# Patient Record
Sex: Female | Born: 1940 | Race: White | Hispanic: No | Smoking: Never smoker
Health system: Southern US, Community
[De-identification: ages and names within clinical notes are randomized; demographics above are authoritative.]

## PROBLEM LIST (undated history)

## (undated) DIAGNOSIS — Z800 Family history of malignant neoplasm of digestive organs: Secondary | ICD-10-CM

## (undated) DIAGNOSIS — Z1501 Genetic susceptibility to malignant neoplasm of breast: Secondary | ICD-10-CM

## (undated) DIAGNOSIS — Z1589 Genetic susceptibility to other disease: Principal | ICD-10-CM

## (undated) DIAGNOSIS — I10 Essential (primary) hypertension: Secondary | ICD-10-CM

## (undated) DIAGNOSIS — Z1509 Genetic susceptibility to other malignant neoplasm: Secondary | ICD-10-CM

## (undated) DIAGNOSIS — Z4689 Encounter for fitting and adjustment of other specified devices: Secondary | ICD-10-CM

## (undated) DIAGNOSIS — Z923 Personal history of irradiation: Secondary | ICD-10-CM

## (undated) DIAGNOSIS — C44301 Unspecified malignant neoplasm of skin of nose: Secondary | ICD-10-CM

## (undated) DIAGNOSIS — M1990 Unspecified osteoarthritis, unspecified site: Secondary | ICD-10-CM

## (undated) DIAGNOSIS — Z87442 Personal history of urinary calculi: Secondary | ICD-10-CM

## (undated) DIAGNOSIS — E785 Hyperlipidemia, unspecified: Secondary | ICD-10-CM

## (undated) DIAGNOSIS — J189 Pneumonia, unspecified organism: Secondary | ICD-10-CM

## (undated) DIAGNOSIS — C50919 Malignant neoplasm of unspecified site of unspecified female breast: Secondary | ICD-10-CM

## (undated) DIAGNOSIS — Z803 Family history of malignant neoplasm of breast: Secondary | ICD-10-CM

## (undated) HISTORY — PX: APPENDECTOMY: SHX54

## (undated) HISTORY — DX: Genetic susceptibility to other malignant neoplasm: Z15.09

## (undated) HISTORY — DX: Family history of malignant neoplasm of breast: Z80.3

## (undated) HISTORY — DX: Hyperlipidemia, unspecified: E78.5

## (undated) HISTORY — PX: LITHOTRIPSY: SUR834

## (undated) HISTORY — DX: Essential (primary) hypertension: I10

## (undated) HISTORY — DX: Genetic susceptibility to other disease: Z15.89

## (undated) HISTORY — DX: Family history of malignant neoplasm of digestive organs: Z80.0

## (undated) HISTORY — DX: Genetic susceptibility to malignant neoplasm of breast: Z15.01

## (undated) HISTORY — PX: OTHER SURGICAL HISTORY: SHX169

## (undated) HISTORY — PX: OVARIAN CYST REMOVAL: SHX89

---

## 1999-11-30 ENCOUNTER — Other Ambulatory Visit: Admission: RE | Admit: 1999-11-30 | Discharge: 1999-11-30 | Payer: Self-pay | Admitting: Internal Medicine

## 2003-12-18 ENCOUNTER — Encounter: Admission: RE | Admit: 2003-12-18 | Discharge: 2003-12-18 | Payer: Self-pay | Admitting: Internal Medicine

## 2004-09-27 ENCOUNTER — Other Ambulatory Visit: Admission: RE | Admit: 2004-09-27 | Discharge: 2004-09-27 | Payer: Self-pay | Admitting: Obstetrics and Gynecology

## 2005-04-17 HISTORY — PX: POLYPECTOMY: SHX149

## 2005-05-02 ENCOUNTER — Ambulatory Visit: Payer: Self-pay | Admitting: Internal Medicine

## 2005-05-11 ENCOUNTER — Ambulatory Visit: Payer: Self-pay | Admitting: Internal Medicine

## 2005-05-11 ENCOUNTER — Encounter (INDEPENDENT_AMBULATORY_CARE_PROVIDER_SITE_OTHER): Payer: Self-pay | Admitting: *Deleted

## 2005-09-28 ENCOUNTER — Other Ambulatory Visit: Admission: RE | Admit: 2005-09-28 | Discharge: 2005-09-28 | Payer: Self-pay | Admitting: Obstetrics & Gynecology

## 2006-07-11 ENCOUNTER — Emergency Department (HOSPITAL_COMMUNITY): Admission: EM | Admit: 2006-07-11 | Discharge: 2006-07-11 | Payer: Self-pay | Admitting: Emergency Medicine

## 2006-07-14 ENCOUNTER — Ambulatory Visit (HOSPITAL_BASED_OUTPATIENT_CLINIC_OR_DEPARTMENT_OTHER): Admission: RE | Admit: 2006-07-14 | Discharge: 2006-07-14 | Payer: Self-pay | Admitting: Urology

## 2006-07-17 ENCOUNTER — Ambulatory Visit (HOSPITAL_COMMUNITY): Admission: RE | Admit: 2006-07-17 | Discharge: 2006-07-17 | Payer: Self-pay | Admitting: Urology

## 2006-12-19 ENCOUNTER — Other Ambulatory Visit: Admission: RE | Admit: 2006-12-19 | Discharge: 2006-12-19 | Payer: Self-pay | Admitting: Obstetrics and Gynecology

## 2008-01-22 ENCOUNTER — Other Ambulatory Visit: Admission: RE | Admit: 2008-01-22 | Discharge: 2008-01-22 | Payer: Self-pay | Admitting: Obstetrics and Gynecology

## 2008-07-31 DIAGNOSIS — M810 Age-related osteoporosis without current pathological fracture: Secondary | ICD-10-CM | POA: Insufficient documentation

## 2008-07-31 DIAGNOSIS — I10 Essential (primary) hypertension: Secondary | ICD-10-CM | POA: Insufficient documentation

## 2009-02-23 ENCOUNTER — Ambulatory Visit (HOSPITAL_COMMUNITY): Admission: RE | Admit: 2009-02-23 | Discharge: 2009-02-23 | Payer: Self-pay | Admitting: Internal Medicine

## 2010-06-01 NOTE — Op Note (Signed)
NAME:  Shawna [name], Shawna [name]             ACCOUNT NO.:  [id number]   MEDICAL RECORD NO.:  [id number]          PATIENT TYPE:  AMB   LOCATION:  [facility]                         FACILITY:  [facility]   PHYSICIAN:  Valetta Fuller, M.D.  DATE OF BIRTH:  [birth year]   DATE OF PROCEDURE:  07/14/2006  DATE OF DISCHARGE:  07/14/2006                               OPERATIVE REPORT   PREOPERATIVE DIAGNOSIS:  Left proximal ureteral calculus.   POSTOPERATIVE DIAGNOSIS:  Left proximal ureteral calculus.   PROCEDURE:  Cystoscopy, left retrograde pyelography, and left double J  stent placement.   SURGEON:  Valetta Fuller, M.D.   ANESTHESIA:  General.   INDICATIONS FOR PROCEDURE:  The patient is a 70 year old female.  She  presented to see me yesterday with left flank pain.  She had presented  to the emergency room with typical renal colic and gross hematuria.  CT  showed a 6 x 8 mm calcification in her proximal left ureter at the L3  level.  The patient continued to have constant pain and when we saw her  she requested intervention.  We felt that the stone had a low likelihood  of spontaneous passage.  We felt that given potential delay for several  days for lithotripsy that she would be better off having temporary  placement of a double J stent followed by lithotripsy which has been  scheduled for early next week.  She appears to understand the advantages  and disadvantages of this approach.   DESCRIPTION OF PROCEDURE:  The patient was brought to the operating room  where she had successful induction of general anesthesia.  She was  placed in the lithotomy position and prepped and draped in the usual  fashion.  Cystoscopy was unremarkable.  Attention was turned to left  retrograde pyelography.   A left retrograde was done with fluoroscopic interpretation.  The distal  ureter was normal.  In the proximal ureter a 6-8 mm filling defect was  encountered with moderate to high grade obstruction.  This was  consistent with the stone previously seen on CT scan.   After retrograde pyelogram, a guide wire was placed up to the renal  pelvis with what appeared to be migration of the stone back into the  renal pelvis.  Over the guide wire, we placed a 7 French 24 cm stent  without difficulty and good positioning was confirmed.  The patient  appeared to tolerate the procedure well.  There were no obvious  complications or difficulty.  She was brought to the recovery room in  stable condition.           ______________________________  Valetta Fuller, M.D.  Electronically Signed     DSG/MEDQ  D:  07/19/2006  T:  07/20/2006  Job:  [id number]

## 2010-06-15 ENCOUNTER — Ambulatory Visit (AMBULATORY_SURGERY_CENTER): Payer: 59 | Admitting: *Deleted

## 2010-06-15 VITALS — Ht 68.5 in | Wt 138.0 lb

## 2010-06-15 DIAGNOSIS — Z86010 Personal history of colonic polyps: Secondary | ICD-10-CM

## 2010-06-15 MED ORDER — PEG-KCL-NACL-NASULF-NA ASC-C 100 G PO SOLR: ORAL | Status: DC

## 2010-06-17 ENCOUNTER — Ambulatory Visit (HOSPITAL_COMMUNITY): Payer: 59 | Attending: Internal Medicine

## 2010-06-17 DIAGNOSIS — M810 Age-related osteoporosis without current pathological fracture: Secondary | ICD-10-CM | POA: Insufficient documentation

## 2010-06-18 HISTORY — PX: COLONOSCOPY: SHX174

## 2010-06-29 ENCOUNTER — Encounter: Payer: Self-pay | Admitting: Internal Medicine

## 2010-06-29 ENCOUNTER — Ambulatory Visit (AMBULATORY_SURGERY_CENTER): Payer: 59 | Admitting: Internal Medicine

## 2010-06-29 VITALS — BP 145/91 | HR 69 | Temp 97.9°F | Resp 21 | Ht 68.5 in | Wt 138.0 lb

## 2010-06-29 DIAGNOSIS — K5730 Diverticulosis of large intestine without perforation or abscess without bleeding: Secondary | ICD-10-CM

## 2010-06-29 DIAGNOSIS — Z1211 Encounter for screening for malignant neoplasm of colon: Secondary | ICD-10-CM

## 2010-06-29 DIAGNOSIS — Z800 Family history of malignant neoplasm of digestive organs: Secondary | ICD-10-CM

## 2010-06-29 DIAGNOSIS — Z86010 Personal history of colonic polyps: Secondary | ICD-10-CM

## 2010-06-29 MED ORDER — SODIUM CHLORIDE 0.9 % IV SOLN: 500.0000 mL | INTRAVENOUS | Status: DC

## 2010-06-29 NOTE — Patient Instructions (Signed)
Please refer to blue and green discharge instruction sheets. Diverticulosis noted today so eat a high fiber diet as part of a healthy lifestyle. Dr. Marina Goodell suggests you have another colonoscopy in 5 years. Be careful to take precautions today for safety as outlined on the blue and green discharge instruction sheets.

## 2010-06-30 ENCOUNTER — Telehealth: Payer: Self-pay | Admitting: *Deleted

## 2010-06-30 NOTE — Telephone Encounter (Signed)

## 2010-11-03 LAB — URINE CULTURE: Colony Count: 40000

## 2010-11-03 LAB — BASIC METABOLIC PANEL
BUN: 11
CO2: 29
Calcium: 9.7
Chloride: 103
Creatinine, Ser: 0.85
GFR calc Af Amer: >60
GFR calc non Af Amer: >60
Glucose, Bld: 107 — ABNORMAL HIGH
Potassium: 4.3
Sodium: 141

## 2010-11-03 LAB — CBC
HCT: 42.7
Hemoglobin: 14.9
MCHC: 34.9
MCV: 94.1
Platelets: 211
RBC: 4.53
RDW: 12.9
WBC: 9.4

## 2010-11-03 LAB — URINALYSIS, ROUTINE W REFLEX MICROSCOPIC
Bilirubin Urine: NEGATIVE
Glucose, UA: NEGATIVE
Hgb urine dipstick: NEGATIVE
Ketones, ur: NEGATIVE
Nitrite: NEGATIVE
Protein, ur: NEGATIVE
Specific Gravity, Urine: 1.014
Urobilinogen, UA: 0.2
pH: 8

## 2010-11-03 LAB — URINE MICROSCOPIC-ADD ON

## 2011-02-15 ENCOUNTER — Other Ambulatory Visit: Payer: Self-pay | Admitting: Dermatology

## 2011-02-22 ENCOUNTER — Other Ambulatory Visit: Payer: Self-pay | Admitting: Dermatology

## 2011-11-09 ENCOUNTER — Other Ambulatory Visit: Payer: Self-pay | Admitting: Dermatology

## 2012-02-13 LAB — HM PAP SMEAR

## 2012-06-18 ENCOUNTER — Encounter (INDEPENDENT_AMBULATORY_CARE_PROVIDER_SITE_OTHER): Payer: BC Managed Care – PPO | Admitting: *Deleted

## 2012-06-18 DIAGNOSIS — M7989 Other specified soft tissue disorders: Secondary | ICD-10-CM

## 2012-06-19 ENCOUNTER — Encounter: Payer: Self-pay | Admitting: Family Medicine

## 2012-07-25 ENCOUNTER — Encounter (HOSPITAL_COMMUNITY): Payer: BC Managed Care – PPO

## 2012-08-01 ENCOUNTER — Ambulatory Visit (HOSPITAL_COMMUNITY)
Admission: RE | Admit: 2012-08-01 | Discharge: 2012-08-01 | Disposition: A | Discharge status: Home / Self Care | Payer: BC Managed Care – PPO | Source: Ambulatory Visit | Attending: Internal Medicine | Admitting: Internal Medicine

## 2012-08-01 ENCOUNTER — Other Ambulatory Visit (HOSPITAL_COMMUNITY): Payer: Self-pay | Admitting: Internal Medicine

## 2012-08-01 DIAGNOSIS — M810 Age-related osteoporosis without current pathological fracture: Secondary | ICD-10-CM | POA: Insufficient documentation

## 2012-08-01 MED ORDER — SODIUM CHLORIDE 0.9 % IV SOLN
Freq: Once | INTRAVENOUS | Status: AC
  Administered 2012-08-01: 12:00:00 via INTRAVENOUS

## 2012-08-01 MED ORDER — ZOLEDRONIC ACID 5 MG/100ML IV SOLN
5.0000 mg | Freq: Once | INTRAVENOUS | Status: AC
  Administered 2012-08-01: 5 mg via INTRAVENOUS
  Filled 2012-08-01: qty 100

## 2012-10-25 DIAGNOSIS — L84 Corns and callosities: Secondary | ICD-10-CM

## 2012-11-15 ENCOUNTER — Other Ambulatory Visit: Payer: Self-pay | Admitting: Dermatology

## 2012-11-17 HISTORY — PX: BREAST SURGERY: SHX581

## 2012-11-19 ENCOUNTER — Other Ambulatory Visit: Payer: Self-pay | Admitting: Radiology

## 2012-12-26 ENCOUNTER — Encounter: Payer: Self-pay | Admitting: Nurse Practitioner

## 2013-02-13 ENCOUNTER — Encounter: Payer: Self-pay | Admitting: Nurse Practitioner

## 2013-02-14 ENCOUNTER — Encounter: Payer: Self-pay | Admitting: Nurse Practitioner

## 2013-02-14 ENCOUNTER — Ambulatory Visit (INDEPENDENT_AMBULATORY_CARE_PROVIDER_SITE_OTHER): Payer: MEDICARE | Admitting: Nurse Practitioner

## 2013-02-14 VITALS — BP 130/76 | HR 76 | Resp 16 | Ht 68.5 in | Wt 139.0 lb

## 2013-02-14 DIAGNOSIS — M810 Age-related osteoporosis without current pathological fracture: Secondary | ICD-10-CM

## 2013-02-14 DIAGNOSIS — Z01419 Encounter for gynecological examination (general) (routine) without abnormal findings: Secondary | ICD-10-CM

## 2013-02-14 DIAGNOSIS — N318 Other neuromuscular dysfunction of bladder: Secondary | ICD-10-CM

## 2013-02-14 DIAGNOSIS — N3281 Overactive bladder: Secondary | ICD-10-CM

## 2013-02-14 NOTE — Patient Instructions (Signed)

## 2013-02-14 NOTE — Progress Notes (Signed)
73 y.o. G84P2002 Widowed Caucasian Fe here for annual exam.  Recent URI since before Christmas that is now improved. Last Reclast 04/2012. Not sure if PCP is going to give another this year.  She will be going for a visit next month.  Children took her to [place] for her 24 th birthday.  No LMP recorded. Patient is postmenopausal.          Sexually active: no  The current method of family planning is post menopausal status.    Exercising: no   Smoker:  no  Health Maintenance: Pap:  02/13/12 neg MMG:  11/2012 Needle Biopsy (benign) Colonoscopy:  06/2010  Normal, repeat in 5 years BMD:   04/2012   Borderline Osteoporosis Labs:  Hgb  PCP     U/A  PCP T-DAP  PCP  reports that she has never smoked. She has never used smokeless tobacco. She reports that she drinks about 4.2 ounces of alcohol per week. She reports that she does not use illicit drugs.  Past Medical History  Diagnosis Date  . Osteoporosis   . Hyperlipidemia   . Hypertension   . Kidney stone on left side     Past Surgical History  Procedure Laterality Date  . Lithotripsy      kidney stone on left side  . Ovarian cyst removal    . Colonoscopy  06/2010    rec  . Polypectomy  04/2005    colon  . Appendectomy    . Breast surgery  11/2012    biopsy, benign breast tissue    Current Outpatient Prescriptions  Medication Sig Dispense Refill  . atorvastatin (LIPITOR) 40 MG tablet Take 20 mg by mouth daily.       . Calcium Carbonate-Vitamin D (CALCIUM 600 + D PO) Take 1 tablet by mouth daily.       . Cholecalciferol (VITAMIN D) 2000 UNITS tablet Take 2,000 Units by mouth daily.        . solifenacin (VESICARE) 5 MG tablet Take 10 mg by mouth daily.      Marland Kitchen telmisartan-hydrochlorothiazide (MICARDIS HCT) 80-12.5 MG per tablet Take 1 tablet by mouth daily.        . zoledronic acid (RECLAST) 5 MG/100ML SOLN Inject 5 mg into the vein once. annually        Current Facility-Administered Medications  Medication Dose Route Frequency  Provider Last Rate Last Dose  . 0.9 %  sodium chloride infusion  500 mL Intravenous Continuous Irene Shipper, MD        Family History  Problem Relation Age of Onset  . Colon cancer Sister 32  . Breast cancer Mother 30  . Hypertension Mother   . Osteoporosis Mother   . Heart disease Father     CABG  . Throat cancer Brother   . Breast cancer Sister 50  . Lymphoma Sister     ROS:  Pertinent items are noted in HPI.  Otherwise, a comprehensive ROS was negative.  Exam:   BP 130/76  Pulse 76  Resp 16  Ht 5' 8.5" (1.74 m)  Wt 139 lb (63.05 kg)  BMI 20.83 kg/m2 Height: 5' 8.5" (174 cm)  Ht Readings from Last 3 Encounters:  02/14/13 5' 8.5" (1.74 m)  08/01/12 5' 8.5" (1.74 m)  06/29/10 5' 8.5" (1.74 m)    General appearance: alert, cooperative and appears stated age Head: Normocephalic, without obvious abnormality, atraumatic Neck: no adenopathy, supple, symmetrical, trachea midline and thyroid normal to inspection and  palpation Lungs: clear to auscultation bilaterally Breasts: normal appearance, no masses or tenderness Heart: regular rate and rhythm Abdomen: soft, non-tender; no masses,  no organomegaly Extremities: extremities normal, atraumatic, no cyanosis or edema Skin: Skin color, texture, turgor normal. No rashes or lesions Lymph nodes: Cervical, supraclavicular, and axillary nodes normal. No abnormal inguinal nodes palpated Neurologic: Grossly normal   Pelvic: External genitalia:  no lesions              Urethra:  normal appearing urethra with no masses, tenderness or lesions              Bartholin's and Skene's: normal                 Vagina: normal appearing vagina with normal color and discharge, no lesions              Cervix: anteverted              Pap taken: no Bimanual Exam:  Uterus:  normal size, contour, position, consistency, mobility, non-tender              Adnexa: no mass, fullness, tenderness               Rectovaginal: Confirms               Anus:   normal sphincter tone, no lesions  A:  Well Woman with normal exam  Postmenopausal - off Prempro 2003  Borderline osteoporosis - prior use of Evista and Fosamax now on Reclast every other year per PCP  OAB - doing well on Vesicare  P:   Pap smear as per guidelines   Mammogram due 11/15  Counseled on breast self exam, mammography screening, adequate intake of calcium and vitamin D, diet and exercise return annually or prn  An After Visit Summary was printed and given to the patient.

## 2013-02-15 NOTE — Progress Notes (Signed)
Reviewed personally.  M. Suzanne Jacquette Canales, MD.  

## 2013-05-17 ENCOUNTER — Ambulatory Visit (INDEPENDENT_AMBULATORY_CARE_PROVIDER_SITE_OTHER): Payer: Medicare Other | Admitting: Podiatrist

## 2013-05-17 ENCOUNTER — Encounter: Payer: Self-pay | Admitting: Podiatrist

## 2013-05-17 VITALS — BP 125/85 | HR 75 | Resp 18

## 2013-05-17 DIAGNOSIS — L84 Corns and callosities: Secondary | ICD-10-CM

## 2013-05-17 NOTE — Patient Instructions (Signed)

## 2013-05-17 NOTE — Progress Notes (Signed)
I am here to get my calluses trimmed up  Chief Complaint  Patient presents with  . Callouses    ball of left foot     HPI: Patient is 73 y.o. female who presents today for care of calluses left foot.      Physical Exam GENERAL APPEARANCE: Alert, conversant. Appropriately groomed. No acute distress.  VASCULAR: Pedal pulses palpable at 2/4 DP and PT bilateral.  Capillary refill time is immediate to all digits,  Proximal to distal cooling it warm to warm.  Digital hair growth is present bilateral  NEUROLOGIC: sensation is intact epicritically and protectively to 5.07 monofilament at 5/5 sites bilateral.  Light touch is intact bilateral, vibratory sensation intact bilateral, achilles tendon reflex is intact bilateral.  MUSCULOSKELETAL: acceptable muscle strength, tone and stability bilateral.   DERMATOLOGIC: callus present plantar left foot.  Intact integument is present post debridement.   Assessment: calluses  Plan:debridement carried out without complication.  salinocaine and padding applied.

## 2013-05-22 ENCOUNTER — Ambulatory Visit: Payer: MEDICARE | Admitting: Podiatrist

## 2013-07-25 ENCOUNTER — Other Ambulatory Visit (HOSPITAL_COMMUNITY): Payer: Self-pay | Admitting: *Deleted

## 2013-07-26 ENCOUNTER — Ambulatory Visit (HOSPITAL_COMMUNITY)
Admission: RE | Admit: 2013-07-26 | Discharge: 2013-07-26 | Disposition: A | Discharge status: Home / Self Care | Payer: Medicare Other | Source: Ambulatory Visit | Attending: Internal Medicine | Admitting: Internal Medicine

## 2013-07-26 DIAGNOSIS — M810 Age-related osteoporosis without current pathological fracture: Secondary | ICD-10-CM | POA: Diagnosis present

## 2013-07-26 MED ORDER — ZOLEDRONIC ACID 5 MG/100ML IV SOLN
INTRAVENOUS | Status: AC
  Filled 2013-07-26: qty 100

## 2013-07-26 MED ORDER — ZOLEDRONIC ACID 5 MG/100ML IV SOLN
5.0000 mg | Freq: Once | INTRAVENOUS | Status: AC
  Administered 2013-07-26: 5 mg via INTRAVENOUS

## 2013-07-31 ENCOUNTER — Encounter (HOSPITAL_COMMUNITY): Payer: BC Managed Care – PPO

## 2013-11-18 ENCOUNTER — Encounter: Payer: Self-pay | Admitting: Podiatrist

## 2013-11-27 ENCOUNTER — Ambulatory Visit (INDEPENDENT_AMBULATORY_CARE_PROVIDER_SITE_OTHER): Payer: Medicare Other | Admitting: Podiatrist

## 2013-11-27 ENCOUNTER — Encounter: Payer: Self-pay | Admitting: Podiatrist

## 2013-11-27 DIAGNOSIS — L84 Corns and callosities: Secondary | ICD-10-CM

## 2013-11-27 DIAGNOSIS — M216X2 Other acquired deformities of left foot: Secondary | ICD-10-CM

## 2013-11-27 NOTE — Patient Instructions (Signed)
Corns and Calluses Corns are small areas of thickened skin that usually occur on the top, sides, or tip of a toe. They contain a cone-shaped core with a point that can press on a nerve below. This causes pain. Calluses are areas of thickened skin that usually develop on hands, fingers, palms, soles of the feet, and heels. These are areas that experience frequent friction or pressure. CAUSES  Corns are usually the result of rubbing (friction) or pressure from shoes that are too tight or do not fit properly. Calluses are caused by repeated friction and pressure on the affected areas. SYMPTOMS  A hard growth on the skin.  Pain or tenderness under the skin.  Sometimes, redness and swelling.  Increased discomfort while wearing tight-fitting shoes. DIAGNOSIS  Your caregiver can usually tell what the problem is by doing a physical exam. TREATMENT  Removing the cause of the friction or pressure is usually the only treatment needed. However, sometimes medicines can be used to help soften the hardened, thickened areas. These medicines include salicylic acid plasters and 12% ammonium lactate lotion. These medicines should only be used under the direction of your caregiver. HOME CARE INSTRUCTIONS   Try to remove pressure from the affected area.  You may wear donut-shaped corn pads to protect your skin.  You may use a pumice stone or nonmetallic nail file to gently reduce the thickness of a corn.  Wear properly fitted footwear.  If you have calluses on the hands, wear gloves during activities that cause friction.  If you have diabetes, you should regularly examine your feet. Tell your caregiver if you notice any problems with your feet. SEEK IMMEDIATE MEDICAL CARE IF:   You have increased pain, swelling, redness, or warmth in the affected area.  Your corn or callus starts to drain fluid or bleeds.  You are not getting better, even with treatment. Document Released: 10/10/2003 Document  Revised: 03/28/2011 Document Reviewed: 08/31/2010 ExitCare Patient Information 2015 [employer]. This information is not intended to replace advice given to you by your health care provider. Make sure you discuss any questions you have with your health care provider.  

## 2013-12-01 NOTE — Progress Notes (Signed)
   HPI: Patient is 73 y.o. female who presents today for care of calluses left foot.  She relates continued pain in the foot especially when the lesions get thick .     Physical Exam GENERAL APPEARANCE: Alert, conversant. Appropriately groomed. No acute distress.  VASCULAR: Pedal pulses palpable at 2/4 DP and PT bilateral.  Capillary refill time is immediate to all digits,  Proximal to distal cooling it warm to warm.  Digital hair growth is present bilateral  NEUROLOGIC: sensation is intact epicritically and protectively to 5.07 monofilament at 5/5 sites bilateral.  Light touch is intact bilateral, vibratory sensation intact bilateral, achilles tendon reflex is intact bilateral.  MUSCULOSKELETAL: acceptable muscle strength, tone and stability bilateral.  Prominent and plantarflexed metatarsals 1,5 noted bilateral,  Fat pad atrophy with prominent heel bone noted left as well.    DERMATOLOGIC: callus present plantar left foot submet 1,5 and heel . Intact integument is present post debridement.    Assessment: calluses  Plan:debridement carried out without complication.  salinocaine and padding applied.

## 2014-02-17 ENCOUNTER — Encounter: Payer: Self-pay | Admitting: Nurse Practitioner

## 2014-02-17 ENCOUNTER — Ambulatory Visit (INDEPENDENT_AMBULATORY_CARE_PROVIDER_SITE_OTHER): Payer: Medicare Other | Admitting: Nurse Practitioner

## 2014-02-17 VITALS — BP 120/72 | HR 72 | Ht 68.5 in | Wt 140.0 lb

## 2014-02-17 DIAGNOSIS — Z01419 Encounter for gynecological examination (general) (routine) without abnormal findings: Secondary | ICD-10-CM

## 2014-02-17 NOTE — Progress Notes (Signed)
74 y.o. G74P2002 Widowed  Caucasian Fe here for annual exam.  Pain left foot.  No new health problems.  Last Reclast  07/26/2013.  Patient's last menstrual period was 01/17/1982.          Sexually active: No.  The current method of family planning is post menopausal status.    Exercising: No.  The patient does not participate in regular exercise at present. Smoker:  no  Health Maintenance: Pap:  02/13/12, negative  MMG:  11/2012, needle biopsy (right) benign, 05/20/13, follow up diagnostic right Bi-Rads 2:  Benign findings, return to yearly mammogram, pt will call to schedule screening mammogram Colonoscopy:  06/2010, normal, repeat in 5 years BMD:   04/2012, borderline osteoporosis pt patient TDaP:  PCP Labs:  PCP takes care of all labs   reports that she has never smoked. She has never used smokeless tobacco. She reports that she drinks about 4.2 oz of alcohol per week. She reports that she does not use illicit drugs.  Past Medical History  Diagnosis Date  . Osteoporosis   . Hyperlipidemia   . Hypertension   . Kidney stone on left side     Past Surgical History  Procedure Laterality Date  . Lithotripsy      kidney stone on left side  . Ovarian cyst removal    . Colonoscopy  06/2010    rec  . Polypectomy  04/2005    colon  . Appendectomy    . Breast surgery  11/2012    biopsy, benign breast tissue    Current Outpatient Prescriptions  Medication Sig Dispense Refill  . Calcium Carbonate-Vitamin D (CALCIUM 600 + D PO) Take 1 tablet by mouth daily.     . Cholecalciferol (VITAMIN D) 2000 UNITS tablet Take 2,000 Units by mouth daily.      . CRESTOR 20 MG tablet Take 1 tablet by mouth daily.  0  . solifenacin (VESICARE) 5 MG tablet Take 10 mg by mouth daily.    Marland Kitchen telmisartan-hydrochlorothiazide (MICARDIS HCT) 80-12.5 MG per tablet Take 1 tablet by mouth daily.      . zoledronic acid (RECLAST) 5 MG/100ML SOLN Inject 5 mg into the vein once. annually      No current  facility-administered medications for this visit.    Family History  Problem Relation Age of Onset  . Colon cancer Sister 49  . Breast cancer Mother 63  . Hypertension Mother   . Osteoporosis Mother   . Heart disease Father     CABG  . Throat cancer Brother   . Breast cancer Sister 97  . Lymphoma Sister     ROS:  Pertinent items are noted in HPI.  Otherwise, a comprehensive ROS was negative.  Exam:   BP 120/72 mmHg  Pulse 72  Ht 5' 8.5" (1.74 m)  Wt 140 lb (63.504 kg)  BMI 20.98 kg/m2  LMP 01/17/1982 Height: 5' 8.5" (174 cm) Ht Readings from Last 3 Encounters:  02/17/14 5' 8.5" (1.74 m)  07/26/13 5\' 9"  (1.753 m)  02/14/13 5' 8.5" (1.74 m)    General appearance: alert, cooperative and appears stated age Head: Normocephalic, without obvious abnormality, atraumatic Neck: no adenopathy, supple, symmetrical, trachea midline and thyroid normal to inspection and palpation Lungs: clear to auscultation bilaterally Breasts: normal appearance, no masses or tenderness Heart: regular rate and rhythm Abdomen: soft, non-tender; no masses,  no organomegaly Extremities: extremities normal, atraumatic, no cyanosis or edema Skin: Skin color, texture, turgor normal. No rashes or lesions  Lymph nodes: Cervical, supraclavicular, and axillary nodes normal. No abnormal inguinal nodes palpated Neurologic: Grossly normal   Pelvic: External genitalia:  no lesions              Urethra:  normal appearing urethra with no masses, tenderness or lesions              Bartholin's and Skene's: normal                 Vagina: normal appearing vagina with normal color and discharge, no lesions              Cervix: anteverted              Pap taken: No. Bimanual Exam:  Uterus:  normal size, contour, position, consistency, mobility, non-tender              Adnexa: no mass, fullness, tenderness               Rectovaginal: Confirms               Anus:  normal sphincter tone, no lesions  Chaperone  present:  no  A:  Well Woman with normal exam  Postmenopausal - off Prempro 2003 Borderline osteoporosis - prior use of Evista and Fosamax now on Reclast every other year per PCP OAB - doing well on Vesicare   P:   Reviewed health and wellness pertinent to exam  Pap smear taken today  Mammogram is due now and will schedule  All meds are given by PCP  Counseled on breast self exam, mammography screening, adequate intake of calcium and vitamin D, diet and exercise return annually or prn  An After Visit Summary was printed and given to the patient.

## 2014-02-17 NOTE — Patient Instructions (Signed)

## 2014-02-23 NOTE — Progress Notes (Signed)
Encounter reviewed by Dr. Jermal Dismuke Silva.  

## 2014-02-26 ENCOUNTER — Encounter: Payer: Self-pay | Admitting: Podiatrist

## 2014-02-26 ENCOUNTER — Ambulatory Visit (INDEPENDENT_AMBULATORY_CARE_PROVIDER_SITE_OTHER): Payer: Medicare Other | Admitting: Podiatrist

## 2014-02-26 DIAGNOSIS — M216X2 Other acquired deformities of left foot: Secondary | ICD-10-CM

## 2014-02-26 DIAGNOSIS — L84 Corns and callosities: Secondary | ICD-10-CM

## 2014-03-12 NOTE — Progress Notes (Signed)
Chief Complaint  Patient presents with  . Callouses    Trim calluses sub 1st and 5th MPJ and plantar heel left   "They have really been sore this time"     HPI: Patient is 74 y.o. female who presents today for painful calluses left foot.  She states they have been very sore lately   No Known Allergies  Physical Exam  Neurovascular status unchanged.  Minimal fat pad diposition is present bilateral.  Prominent metatarsals submet 1,5 left is noted.  Prominent left heel is also noted.  Hyperkeratotic lesions are present left foot x 3.  Intact integument is present with no sign of infection present.  Assessment: boney prominence left foot with callus present x 3  Plan: debridement of lesions carried out today without complication.  Discussed the positive benefit of a soft accomidative insert for her comfort.

## 2014-07-23 ENCOUNTER — Encounter: Payer: Self-pay | Admitting: Internal Medicine

## 2014-08-05 ENCOUNTER — Encounter (HOSPITAL_COMMUNITY): Payer: Self-pay

## 2014-08-05 ENCOUNTER — Ambulatory Visit (HOSPITAL_COMMUNITY)
Admission: RE | Admit: 2014-08-05 | Discharge: 2014-08-05 | Disposition: A | Discharge status: Home / Self Care | Payer: Medicare Other | Source: Ambulatory Visit | Attending: Internal Medicine | Admitting: Internal Medicine

## 2014-08-05 ENCOUNTER — Encounter (INDEPENDENT_AMBULATORY_CARE_PROVIDER_SITE_OTHER): Payer: Self-pay

## 2014-08-05 ENCOUNTER — Other Ambulatory Visit (HOSPITAL_COMMUNITY): Payer: Self-pay | Admitting: Internal Medicine

## 2014-08-05 DIAGNOSIS — M810 Age-related osteoporosis without current pathological fracture: Secondary | ICD-10-CM | POA: Diagnosis not present

## 2014-08-05 MED ORDER — SODIUM CHLORIDE 0.9 % IV SOLN
Freq: Once | INTRAVENOUS | Status: AC
  Administered 2014-08-05: 13:00:00 via INTRAVENOUS

## 2014-08-05 MED ORDER — ZOLEDRONIC ACID 5 MG/100ML IV SOLN
5.0000 mg | Freq: Once | INTRAVENOUS | Status: AC
  Administered 2014-08-05: 5 mg via INTRAVENOUS
  Filled 2014-08-05: qty 100

## 2014-08-05 NOTE — Discharge Instructions (Signed)

## 2014-08-05 NOTE — Progress Notes (Signed)
Pt is currently here receiving her reclast infusion.  Pt states she is currently taking vitamin d and calcium.  D/c instructions on reclast given and explained to pt.

## 2014-08-20 ENCOUNTER — Encounter: Payer: Self-pay | Admitting: Podiatry

## 2014-08-20 ENCOUNTER — Ambulatory Visit (INDEPENDENT_AMBULATORY_CARE_PROVIDER_SITE_OTHER): Payer: Medicare Other | Admitting: Podiatry

## 2014-08-20 VITALS — BP 115/74 | HR 75 | Resp 16

## 2014-08-20 DIAGNOSIS — M216X2 Other acquired deformities of left foot: Secondary | ICD-10-CM

## 2014-08-20 DIAGNOSIS — L84 Corns and callosities: Secondary | ICD-10-CM

## 2014-08-20 NOTE — Progress Notes (Signed)
Subjective:     Patient ID: Shawna [name], female   DOB: 1940-10-16, 74 y.o.   MRN: [id number]  HPIThis patient presents to the office for treatmetn of her pre-ulcerous callus on left foot.  She has also developed painful callus on left heel.  She says the calluses become painful walking and wearing her shoes. She says she is going to [place] in 2 weeks.   Review of Systems     Objective:   Physical Exam GENERAL APPEARANCE: Alert, conversant. Appropriately groomed. No acute distress.  VASCULAR: Pedal pulses palpable at 2/4 DP and PT bilateral.  Capillary refill time is immediate to all digits,  Proximal to distal cooling it warm to warm.  Digital hair growth is present bilateral  NEUROLOGIC: sensation is intact epicritically and protectively to 5.07 monofilament at 5/5 sites bilateral.  Light touch is intact bilateral, vibratory sensation intact bilateral, achilles tendon reflex is intact bilateral.  MUSCULOSKELETAL: acceptable muscle strength, tone and stability bilateral.  Intrinsic muscluature intact bilateral.  Rectus appearance of foot and digits noted bilateral.   DERMATOLOGIC: skin color, texture, and turgor are within normal limits.  No  interdigital maceration noted.  No open lesions present.  Digital nails are asymptomatic. No drainage noted. Preulcerous callus noted  Sub 1,2 and sub 5th left foot      Assessment:     Preulcerous callus/IPK left fpoot     Plan:     Debridement of callus left foot.

## 2014-10-30 DIAGNOSIS — R945 Abnormal results of liver function studies: Secondary | ICD-10-CM | POA: Insufficient documentation

## 2014-10-30 DIAGNOSIS — C4491 Basal cell carcinoma of skin, unspecified: Secondary | ICD-10-CM | POA: Insufficient documentation

## 2014-10-30 DIAGNOSIS — L84 Corns and callosities: Secondary | ICD-10-CM | POA: Insufficient documentation

## 2014-11-18 DIAGNOSIS — C50919 Malignant neoplasm of unspecified site of unspecified female breast: Secondary | ICD-10-CM

## 2014-11-18 HISTORY — DX: Malignant neoplasm of unspecified site of unspecified female breast: C50.919

## 2014-12-09 ENCOUNTER — Other Ambulatory Visit: Payer: Self-pay | Admitting: Radiology

## 2014-12-19 ENCOUNTER — Telehealth: Payer: Self-pay | Admitting: *Deleted

## 2014-12-19 NOTE — Telephone Encounter (Signed)
Mailed before appt letter, calendar, welcoming packet & intake form to pt. 

## 2014-12-23 ENCOUNTER — Ambulatory Visit: Payer: Medicare Other | Admitting: Hematology and Oncology

## 2014-12-23 NOTE — Progress Notes (Addendum)
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  Past/Anticipated interventions by surgeon, if any: She saw Dr. [name] as a consult on 12/19/14. The plan is to 1) see med oncology for probable need for chemotx, 2) Right Breast Lumpectomy, SLNBx, and probable port placement (after seeing oncology) 3) genetics counseling.   Past/Anticipated interventions by medical oncology, if any: Chemotherapy: Dr. Lindi Adie 12/30/14 recommends: 1. Breast conserving surgery followed by 2. Adjuvant chemo with dose dense adriamycin Cytoxan x 4 followed by Abraxane weekly x 12 3. Adjuvant radiation therapy followed by 4. Adjuvant antiestrogen therapy  Lymphedema issues, if any:  None  Pain issues, if any: None  SAFETY ISSUES:  Prior radiation? No  Pacemaker/ICD? No  Possible current pregnancy? No  Is the patient on methotrexate? No  Current Complaints / other details:   She menarched at early age of 74 and went to menopause at age 63  She had 2 pregnancy, her first child was born at age 52  She has received birth control pills for uncertain timeframe.  She was never exposed to fertility medications or hormone replacement therapy.  She has family history of Breast/GYN/GI cancer Mom a 36 breast cancer, brother multiple skin cancers, half brother age 20 throat cancer, half-sister age. 66. Melanoma, half-sister age 48 breast cancer, half-sister age 28 colon cancer Payten Beaumier, Stephani Police, RN 12/23/2014,2:32 PM  BP 118/71 mmHg  Pulse 79  Temp(Src) 97.4 F (36.3 C)  Ht '5\' 9"'  (1.753 m)  Wt 136 lb 11.2 oz (62.007 kg)  BMI 20.18 kg/m2  LMP 01/17/1982

## 2014-12-29 ENCOUNTER — Other Ambulatory Visit: Payer: Self-pay

## 2014-12-30 ENCOUNTER — Encounter: Payer: Self-pay | Admitting: Hematology and Oncology

## 2014-12-30 ENCOUNTER — Telehealth: Payer: Self-pay | Admitting: Genetic Counselor

## 2014-12-30 ENCOUNTER — Encounter: Payer: Medicare Other | Admitting: Genetic Counselor

## 2014-12-30 ENCOUNTER — Ambulatory Visit (HOSPITAL_BASED_OUTPATIENT_CLINIC_OR_DEPARTMENT_OTHER): Payer: Medicare Other | Admitting: Hematology and Oncology

## 2014-12-30 ENCOUNTER — Encounter: Payer: Self-pay | Admitting: *Deleted

## 2014-12-30 ENCOUNTER — Other Ambulatory Visit (HOSPITAL_BASED_OUTPATIENT_CLINIC_OR_DEPARTMENT_OTHER): Payer: Medicare Other

## 2014-12-30 VITALS — BP 126/71 | HR 86 | Temp 97.5°F | Resp 18 | Ht 69.0 in | Wt 135.6 lb

## 2014-12-30 DIAGNOSIS — C50311 Malignant neoplasm of lower-inner quadrant of right female breast: Secondary | ICD-10-CM

## 2014-12-30 DIAGNOSIS — Z171 Estrogen receptor negative status [ER-]: Secondary | ICD-10-CM

## 2014-12-30 NOTE — Addendum Note (Signed)
Addended by: Prentiss Bells on: 12/30/2014 04:42 PM   Modules accepted: Medications

## 2014-12-30 NOTE — Progress Notes (Signed)
Radiation Oncology         [phone] ________________________________  Initial outpatient Consultation  Name: Shawna [name] MRN: [id number]  Date: 12/31/2014  DOB: 27-Jun-1940  CB:ULAGT,XMIW Jerilynn Mages, MD  [name], MD   REFERRING PHYSICIAN: [name], MD  DIAGNOSIS:    ICD-9-CM ICD-10-CM   1. 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 female who presented with a 1cm suspicious mass on screening mammography in the LIQ of the right breast; this was not palpable per the patient.  Biopsy on 12-09-14  showed invasive ductal carcinoma with characteristics as described above in the diagnosis.  Due to family history, including breast cancer in her mother, her surgeon has referred her to genetics.  Plan for breast conservation surgery.  Dr. Lindi Adie recommends chemotherapy to follow.  She recently decided to retire, prior to diagnosis. She is with her daughter today. In her USOH, no other complaints.  Has high BP and cholesterol treated with medications. Prior benign right breast biopsy 1-2 years ago.  She  has had mobile rounded masses growing in a "chain" along her right arm for several years.  PREVIOUS RADIATION THERAPY: No  PAST MEDICAL HISTORY:  has a past medical history of Osteoporosis; Hyperlipidemia; Hypertension; and Kidney stone on left side.    PAST SURGICAL HISTORY: Past Surgical History  Procedure Laterality Date  . Lithotripsy      kidney stone on left side  . Ovarian cyst removal    . Colonoscopy  06/2010    rec  . Polypectomy  04/2005    colon  . Appendectomy    . Breast surgery  11/2012    biopsy, benign breast tissue    FAMILY HISTORY: family history includes Breast cancer (age of onset: 26) in her sister; Breast cancer (age of onset: 76)  in her mother; Colon cancer (age of onset: 38) in her sister; Heart disease in her father; Hypertension in her mother; Lymphoma in her sister; Melanoma in her sister; Osteoporosis in her mother; Skin cancer in her brother; Throat cancer in her brother.  SOCIAL HISTORY:  reports that she has never smoked. She has never used smokeless tobacco. She reports that she drinks about 4.2 oz of alcohol per week. She reports that she does not use illicit drugs.  ALLERGIES: Review of patient's allergies indicates no known allergies.  MEDICATIONS:  Current Outpatient Prescriptions  Medication Sig Dispense Refill  . ALPRAZolam (XANAX) 0.25 MG tablet TAKE 1-2 TABLETS BY MOUTH 2 TIMES DAILY AS NEEDED  0  . Calcium Carbonate-Vitamin D (CALCIUM 600 + D PO) Take 1 tablet by mouth daily.     . Cholecalciferol (VITAMIN D) 2000 UNITS tablet Take 2,000 Units by mouth daily.      . CRESTOR 20 MG tablet Take 1 tablet by mouth daily.  0  . solifenacin (VESICARE) 5 MG tablet Take 10 mg by mouth daily.    Marland Kitchen telmisartan-hydrochlorothiazide (MICARDIS HCT) 80-12.5 MG per tablet Take 1 tablet by mouth daily.      . zoledronic acid (RECLAST) 5 MG/100ML SOLN Inject 5 mg into the vein once. annually      No current facility-administered medications for this encounter.    REVIEW OF SYSTEMS:  Notable for that above.    PHYSICAL EXAM:  height  is _0  (1.753 m) and weight is 136 lb 11.2 oz (62.007 kg). Her temperature is 97.4 F (36.3 C). Her blood pressure is 118/71 and her pulse is 79.   General: Alert and oriented, in no acute distress HEENT: Head is normocephalic. Extraocular movements are intact. Oropharynx is clear. Neck: Neck is supple, no palpable cervical or supraclavicular lymphadenopathy. Heart: Regular in rate and rhythm with no murmurs, rubs, or gallops. Chest: Clear to auscultation bilaterally, with no rhonchi, wheezes, or rales. Abdomen: Soft, nontender, nondistended, with no rigidity or  guarding. Extremities: No cyanosis or edema. She has  mobile rounded masses growing   along her upper and lower right arm - possible lipomas Lymphatics: see Neck Exam Skin: No concerning lesions. Musculoskeletal: symmetric strength and muscle tone throughout. Neurologic: Cranial nerves II through XII are grossly intact. No obvious focalities. Speech is fluent. Coordination is intact. Psychiatric: Judgment and insight are intact. Affect is appropriate. Breasts: there is a mass that is 1-1.5cm in the LIQ of the right breast.  No other breast masses bilaterally, nor any palpable axillary nodes.  ECOG = 0  0 - Asymptomatic (Fully active, able to carry on all predisease activities without restriction)  1 - Symptomatic but completely ambulatory (Restricted in physically strenuous activity but ambulatory and able to carry out work of a light or sedentary nature. For example, light housework, office work)  2 - Symptomatic, <50% in bed during the day (Ambulatory and capable of all self care but unable to carry out any work activities. Up and about more than 50% of waking hours)  3 - Symptomatic, >50% in bed, but not bedbound (Capable of only limited self-care, confined to bed or chair 50% or more of waking hours)  4 - Bedbound (Completely disabled. Cannot carry on any self-care. Totally confined to bed or chair)  5 - Death   Eustace Pen MM, Creech RH, Tormey DC, et al. [phone]). "Toxicity and response criteria of the [facility]". [place] Oncol. 5 (6): 649-55   LABORATORY DATA:  Lab Results  Component Value Date   WBC 9.4 07/13/2006   HGB 14.9 07/13/2006   HCT 42.7 07/13/2006   MCV 94.1 07/13/2006   PLT 211 07/13/2006   CMP     Component Value Date/Time   NA 141 07/13/2006 1524   K 4.3 07/13/2006 1524   CL 103 07/13/2006 1524   CO2 29 07/13/2006 1524   GLUCOSE 107* 07/13/2006 1524   BUN 11 07/13/2006 1524   CREATININE 0.85 07/13/2006 1524   CALCIUM 9.7  07/13/2006 1524   GFRNONAA >60 07/13/2006 1524   GFRAA  07/13/2006 1524    >60        The eGFR has been calculated using the MDRD equation. This calculation has not been validated in all clinical         RADIOGRAPHY: No results found.    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  She is intent on breast conservation. She will proceed with surgery, then likely chemotherapy, then radiotherapy per our discussion today.  It was a pleasure meeting the patient today. We discussed the risks, benefits, and side effects of adjuvant radiotherapy. We discussed that radiation would take approximately 6 weeks to complete and that I would give the patient a few weeks to recover  following chemotherapy before starting treatment planning. We spoke about acute effects including skin irritation and fatigue as well as much less common late effects including lung  irritation. We  spoke about the latest technology that is used to minimize the risk of late effects for breast cancer patients undergoing radiotherapy. No guarantees of treatment were given. The patient is enthusiastic about proceeding with treatment. I look forward to participating in the patient's care. Consent signed today.  She has  mobile rounded masses growing   along her upper and lower right arm - possible lipomas - advised patient to point these out to Dr Lucia Gaskins so he is aware of her baseline.  Genetics referral was made by Dr Lucia Gaskins.    __________________________________________   Eppie Gibson, MD

## 2014-12-30 NOTE — Assessment & Plan Note (Signed)
Right breast biopsy 3:30 position 12/09/2014: Invasive ductal carcinoma grade 3, ER 0%, PR 0%, HER-2 negative ratio 1.5, Ki-67 90%, 1 cm irregular mass T1C N0 stage IA clinical stage  Pathology and radiology counseling:Discussed with the patient, the details of pathology including the type of breast cancer,the clinical staging, the significance of ER, PR and HER-2/neu receptors and the implications for treatment. After reviewing the pathology in detail, we proceeded to discuss the different treatment options between surgery, radiation, chemotherapy, antiestrogen therapies.  Recommendations: 1. Breast conserving surgery followed by 2. Adjuvant chemo with dose dense adriamycin Cytoxan x 4 followed by Abraxane weekly x 12 3. Adjuvant radiation therapy followed by 4. Adjuvant antiestrogen therapy  I discussed with the patient that chemotherapy could be given preoperatively versus postoperatively and the overall survival outcomes would be similar.

## 2014-12-30 NOTE — Progress Notes (Signed)
[facility] CONSULT NOTE  Patient Care Team: [name], MD as PCP - General (Internal Medicine) [name], MD as Consulting Physician (General Surgery) [name], MD as Consulting Physician (Hematology and Oncology)  CHIEF COMPLAINTS/PURPOSE OF CONSULTATION:  Newly diagnosed breast cancer  HISTORY OF PRESENTING ILLNESS:  Shawna [name] 74 y.o. female is here because of recent diagnosis of right breast cancer. She had routine screening mammogram which revealed a right breast abnormality. Mammogram revealed a 1 cm irregular density in the right breast at 3:30 position. She underwent an ultrasound-guided biopsy which came back as invasive ductal carcinoma grade 3 that was triple negative with a Ki-67 of 90%. She has seen Dr. Lucia Gaskins who recommended lumpectomy. She is seeing [place] today to discuss adjuvant chemotherapy options.  I reviewed her records extensively and collaborated the history with the patient.  SUMMARY OF ONCOLOGIC HISTORY:   Breast cancer of lower-inner quadrant of right female breast ([place])   12/02/2014 Mammogram Right breast irregular mass 1 cm size with calcifications   12/09/2014 Initial Diagnosis Right breast biopsy 3:30 position: Invasive ductal carcinoma grade 3, ER 0%, PR 0%, HER-2 negative ratio 1.5, Ki-67 90%, T1C N0 stage IA clinical stage    In terms of breast cancer risk profile:  She menarched at early age of 47 and went to menopause at age 42  She had 2 pregnancy, her first child was born at age 41  She has received birth control pills for uncertain timeframe.  She was never exposed to fertility medications or hormone replacement therapy.  She has  family history of Breast/GYN/GI cancer Mom a 22 breast cancer, brother multiple skin cancers, half brother age 29 throat cancer, half-sister age. 71. Melanoma, half-sister age 15 breast cancer, half-sister age 48 colon cancer  MEDICAL HISTORY:  Past Medical History  Diagnosis Date  .  Osteoporosis   . Hyperlipidemia   . Hypertension   . Kidney stone on left side     SURGICAL HISTORY: Past Surgical History  Procedure Laterality Date  . Lithotripsy      kidney stone on left side  . Ovarian cyst removal    . Colonoscopy  06/2010    rec  . Polypectomy  04/2005    colon  . Appendectomy    . Breast surgery  11/2012    biopsy, benign breast tissue    SOCIAL HISTORY: Social History   Social History  . Marital Status: Widowed    Spouse Name: N/A  . Number of Children: N/A  . Years of Education: N/A   Occupational History  . Not on file.   Social History Main Topics  . Smoking status: Never Smoker   . Smokeless tobacco: Never Used  . Alcohol Use: 4.2 oz/week    7 Glasses of wine per week     Comment: 6-7 glasses of wine a week  . Drug Use: No  . Sexual Activity: No   Other Topics Concern  . Not on file   Social History Narrative    FAMILY HISTORY: Family History  Problem Relation Age of Onset  . Colon cancer Sister 82  . Breast cancer Mother 25  . Hypertension Mother   . Osteoporosis Mother   . Heart disease Father     CABG  . Throat cancer Brother   . Breast cancer Sister 69  . Lymphoma Sister     ALLERGIES:  has No Known Allergies.  MEDICATIONS:  Current Outpatient Prescriptions  Medication Sig Dispense Refill  .  Calcium Carbonate-Vitamin D (CALCIUM 600 + D PO) Take 1 tablet by mouth daily.     . Cholecalciferol (VITAMIN D) 2000 UNITS tablet Take 2,000 Units by mouth daily.      . CRESTOR 20 MG tablet Take 1 tablet by mouth daily.  0  . solifenacin (VESICARE) 5 MG tablet Take 10 mg by mouth daily.    Marland Kitchen telmisartan-hydrochlorothiazide (MICARDIS HCT) 80-12.5 MG per tablet Take 1 tablet by mouth daily.      . zoledronic acid (RECLAST) 5 MG/100ML SOLN Inject 5 mg into the vein once. annually      No current facility-administered medications for this visit.    REVIEW OF SYSTEMS:   Constitutional: Denies fevers, chills or abnormal  night sweats Eyes: Denies blurriness of vision, double vision or watery eyes Ears, nose, mouth, throat, and face: Denies mucositis or sore throat Respiratory: Denies cough, dyspnea or wheezes Cardiovascular: Denies palpitation, chest discomfort or lower extremity swelling Gastrointestinal:  Denies nausea, heartburn or change in bowel habits Skin: Denies abnormal skin rashes Lymphatics: Denies new lymphadenopathy or easy bruising Neurological:Denies numbness, tingling or new weaknesses Behavioral/Psych: Mood is stable, no new changes  Breast:  Denies any palpable lumps or discharge All other systems were reviewed with the patient and are negative.  PHYSICAL EXAMINATION: ECOG PERFORMANCE STATUS: 0 - Asymptomatic  Filed Vitals:   12/30/14 1300  BP: 126/71  Pulse: 86  Temp: 97.5 F (36.4 C)  Resp: 18   Filed Weights   12/30/14 1300  Weight: 135 lb 9.6 oz (61.508 kg)    GENERAL:alert, no distress and comfortable SKIN: skin color, texture, turgor are normal, no rashes or significant lesions EYES: normal, conjunctiva are pink and non-injected, sclera clear OROPHARYNX:no exudate, no erythema and lips, buccal mucosa, and tongue normal  NECK: supple, thyroid normal size, non-tender, without nodularity LYMPH:  no palpable lymphadenopathy in the cervical, axillary or inguinal LUNGS: clear to auscultation and percussion with normal breathing effort HEART: regular rate & rhythm and no murmurs and no lower extremity edema ABDOMEN:abdomen soft, non-tender and normal bowel sounds Musculoskeletal:no cyanosis of digits and no clubbing  PSYCH: alert & oriented x 3 with fluent speech NEURO: no focal motor/sensory deficits BREAST: No palpable nodules in breast. No palpable axillary or supraclavicular lymphadenopathy (exam performed in the presence of a chaperone)   LABORATORY DATA:  I have reviewed the data as listed Lab Results  Component Value Date   WBC 9.4 07/13/2006   HGB 14.9  07/13/2006   HCT 42.7 07/13/2006   MCV 94.1 07/13/2006   PLT 211 07/13/2006   Lab Results  Component Value Date   NA 141 07/13/2006   K 4.3 07/13/2006   CL 103 07/13/2006   CO2 29 07/13/2006   ASSESSMENT AND PLAN:  Breast cancer of lower-inner quadrant of right female breast ([facility]) Right breast biopsy 3:30 position 12/09/2014: Invasive ductal carcinoma grade 3, ER 0%, PR 0%, HER-2 negative ratio 1.5, Ki-67 90%, 1 cm irregular mass T1C N0 stage IA clinical stage  Pathology and radiology counseling:Discussed with the patient, the details of pathology including the type of breast cancer,the clinical staging, the significance of ER, PR and HER-2/neu receptors and the implications for treatment. After reviewing the pathology in detail, we proceeded to discuss the different treatment options between surgery, radiation, chemotherapy, antiestrogen therapies.  Recommendations: 1. Breast conserving surgery followed by 2. Adjuvant chemo with dose dense adriamycin Cytoxan x 4 followed by Abraxane weekly x 12 3. Adjuvant radiation therapy followed by  4. Adjuvant antiestrogen therapy  Patient will need genetic counseling which will be arranged for tomorrow 12/31/2014. Patient will need a port placement, echocardiogram, chemotherapy class before beginning chemotherapy. I plan to start chemotherapy 1 month after surgery. We will finalize a treatment plan once she returns back from surgery so that we can see the final pathology report.  All questions were answered. The patient knows to call the clinic with any problems, questions or concerns.    Rulon Eisenmenger, MD 2:46 PM

## 2014-12-31 ENCOUNTER — Ambulatory Visit
Admission: RE | Admit: 2014-12-31 | Discharge: 2014-12-31 | Disposition: A | Discharge status: Home / Self Care | Payer: Medicare Other | Source: Ambulatory Visit | Attending: Radiation Oncology | Admitting: Radiation Oncology

## 2014-12-31 ENCOUNTER — Other Ambulatory Visit: Payer: Self-pay | Admitting: Surgery

## 2014-12-31 ENCOUNTER — Other Ambulatory Visit: Payer: Medicare Other

## 2014-12-31 ENCOUNTER — Encounter: Payer: Self-pay | Admitting: Radiation Oncology

## 2014-12-31 ENCOUNTER — Encounter: Payer: Self-pay | Admitting: Genetic Counselor

## 2014-12-31 ENCOUNTER — Ambulatory Visit (HOSPITAL_BASED_OUTPATIENT_CLINIC_OR_DEPARTMENT_OTHER): Payer: Medicare Other | Admitting: Genetic Counselor

## 2014-12-31 VITALS — BP 118/71 | HR 79 | Temp 97.4°F | Ht 69.0 in | Wt 136.7 lb

## 2014-12-31 DIAGNOSIS — F1010 Alcohol abuse, uncomplicated: Secondary | ICD-10-CM | POA: Diagnosis not present

## 2014-12-31 DIAGNOSIS — Z800 Family history of malignant neoplasm of digestive organs: Secondary | ICD-10-CM | POA: Insufficient documentation

## 2014-12-31 DIAGNOSIS — Z171 Estrogen receptor negative status [ER-]: Secondary | ICD-10-CM | POA: Diagnosis not present

## 2014-12-31 DIAGNOSIS — C50311 Malignant neoplasm of lower-inner quadrant of right female breast: Secondary | ICD-10-CM | POA: Diagnosis not present

## 2014-12-31 DIAGNOSIS — I10 Essential (primary) hypertension: Secondary | ICD-10-CM | POA: Diagnosis not present

## 2014-12-31 DIAGNOSIS — Z87442 Personal history of urinary calculi: Secondary | ICD-10-CM | POA: Insufficient documentation

## 2014-12-31 DIAGNOSIS — Z803 Family history of malignant neoplasm of breast: Secondary | ICD-10-CM | POA: Diagnosis not present

## 2014-12-31 DIAGNOSIS — E785 Hyperlipidemia, unspecified: Secondary | ICD-10-CM | POA: Diagnosis not present

## 2014-12-31 DIAGNOSIS — C50911 Malignant neoplasm of unspecified site of right female breast: Secondary | ICD-10-CM

## 2014-12-31 DIAGNOSIS — M810 Age-related osteoporosis without current pathological fracture: Secondary | ICD-10-CM | POA: Insufficient documentation

## 2014-12-31 DIAGNOSIS — Z315 Encounter for genetic counseling: Secondary | ICD-10-CM | POA: Diagnosis not present

## 2014-12-31 NOTE — Progress Notes (Signed)
REFERRING PROVIDER: [name], MD [name], [name] [id number]   [name], MD  [name], MD  PRIMARY PROVIDER:  Precious Reel, MD  PRIMARY REASON FOR VISIT:  1. Breast cancer of lower-inner quadrant of right female breast ([place])   2. Family history of breast cancer   3. Family history of colon cancer      HISTORY OF PRESENT ILLNESS:   Shawna [name], a 74 y.o. female, was seen for a [facility] cancer genetics consultation at the request of Dr. Lindi [name] due to a personal and family history of cancer.  Shawna [name] presents to clinic today to discuss the possibility of a hereditary predisposition to cancer, genetic testing, and to further clarify her future cancer risks, as well as potential cancer risks for family members.   In 2016, at the age of 78, Shawna [name] was diagnosed with invasive ductal carcinoma of the right breast.  The tumor is triple negative.  This will be treated with lumpectomy, chemotherapy and radiation.    CANCER HISTORY:    Breast cancer of lower-inner quadrant of right female breast ([place])   12/02/2014 Mammogram Right breast irregular mass 1 cm size with calcifications   12/09/2014 Initial Diagnosis Right breast biopsy 3:30 position: Invasive ductal carcinoma grade 3, ER 0%, PR 0%, HER-2 negative ratio 1.5, Ki-67 90%, T1C N0 stage IA clinical stage     HORMONAL RISK FACTORS:  Menarche was at age 27.  First live birth at age 67.  OCP use for approximately unknown years.  Ovaries intact: yes, but she had a "tumor" on her ovary that was removed at age 59.  She reports that it was called a tumor and not a cyst.  Hysterectomy: no.  Menopausal status: postmenopausal.  HRT use: 0 years. Colonoscopy: yes; 1 polyp. Mammogram within the last year: yes. Number of breast biopsies: 2. Up to date with pelvic exams:  yes. Any excessive radiation exposure in the past:  no  Past Medical History  Diagnosis Date  . Osteoporosis   . Hyperlipidemia    . Hypertension   . Kidney stone on left side   . Family history of breast cancer   . Family history of colon cancer     Past Surgical History  Procedure Laterality Date  . Lithotripsy      kidney stone on left side  . Ovarian cyst removal    . Colonoscopy  06/2010    rec  . Polypectomy  04/2005    colon  . Appendectomy    . Breast surgery  11/2012    biopsy, benign breast tissue    Social History   Social History  . Marital Status: Widowed    Spouse Name: N/A  . Number of Children: 2  . Years of Education: N/A   Social History Main Topics  . Smoking status: Never Smoker   . Smokeless tobacco: Never Used  . Alcohol Use: 4.2 oz/week    7 Glasses of wine per week     Comment: 6-7 glasses of wine a week  . Drug Use: No  . Sexual Activity: No   Other Topics Concern  . None   Social History Narrative     FAMILY HISTORY:  We obtained a detailed, 4-generation family history.  Significant diagnoses are listed below: Family History  Problem Relation Age of Onset  . Colon cancer Sister 59  . Breast cancer Mother 50  . Hypertension Mother   . Osteoporosis Mother   .  Heart disease Father     CABG  . Dementia Father   . Throat cancer Brother 24  . Breast cancer Sister 53  . Lymphoma Sister 60  . Melanoma Sister   . Skin cancer Brother   . Skin cancer Daughter    The patient has two children who are cancer free.  She has one full brother who has skin cancer, and four half sisters and a half brother.  One half sister had breast cancer at 57, one half sister had lymphoma at 24, and third sister had colon cancer at 76 and died at 29.  Her other half brother was diagnosed with throat cancer at 9.  Her mother was diagnosed with breast cancer at 93.  Her mother had two sisters and one brother who were cancer free.  The patient's father died of dementia and heart disease at 46.  He had one brother who died in childhood.  There is no other reported family history of cancer.   Patient's maternal ancestors are of [place] descent, and paternal ancestors are of English descent. There is no reported Ashkenazi Jewish ancestry. There is no known consanguinity.  GENETIC COUNSELING ASSESSMENT: Shawna [name] is a 74 y.o. female with a personal and family history of breast cancer and family history of colon cancer and other cancers which is somewhat suggestive of a hereditary cancer syndrome and predisposition to cancer. We, therefore, discussed and recommended the following at today's visit.   DISCUSSION: We discussed that about 5-10% of breast cancer is the result of a hereditary cancer syndrome.  Most are due to BRCA mutations.  There are other hereditary mutations that we see relatively commonly in PALB2, CHEK2 and ATM.  We reviewed the characteristics, features and inheritance patterns of hereditary cancer syndromes. We also discussed genetic testing, including the appropriate family members to test, the process of testing, insurance coverage and turn-around-time for results. We discussed the implications of a negative, positive and/or variant of uncertain significant result. We recommended Shawna [name] pursue genetic testing for the Breast/Ovarian cancer gene panel. The Breast/Ovarian gene panel offered by [facility] includes sequencing and rearrangement analysis for the following 20 genes:  ATM, BARD1, BRCA1, BRCA2, BRIP1, CDH1, CHEK2, EPCAM, FANCC, MLH1, MSH2, MSH6, NBN, PALB2, PMS2, PTEN, RAD51C, RAD51D, TP53, and XRCC2.     Based on Ms. Petralia's personal and family history of cancer, she meets medical criteria for genetic testing. Despite that she meets criteria, she may still have an out of pocket cost. We discussed that if her out of pocket cost for testing is over $100, the laboratory will call and confirm whether she wants to proceed with testing.  If the out of pocket cost of testing is less than $100 she will be billed by the genetic testing laboratory.   PLAN: After  considering the risks, benefits, and limitations, Shawna [name]  provided informed consent to pursue genetic testing and the blood sample was sent to [facility] for analysis of the Breast/Ovarian cancer gene panel. Results should be available within approximately 2-3 weeks' time, at which point they will be disclosed by telephone to Shawna [name], as will any additional recommendations warranted by these results. Shawna [name] will receive a summary of her genetic counseling visit and a copy of her results once available. This information will also be available in Epic. We encouraged Shawna [name] to remain in contact with cancer genetics annually so that we can continuously update the family history and inform her of any changes  in cancer genetics and testing that may be of benefit for her family. Shawna [name] questions were answered to her satisfaction today. Our contact information was provided should additional questions or concerns arise.  Lastly, we encouraged Shawna [name] to remain in contact with cancer genetics annually so that we can continuously update the family history and inform her of any changes in cancer genetics and testing that may be of benefit for this family.   Ms.  [name] questions were answered to her satisfaction today. Our contact information was provided should additional questions or concerns arise. Thank you for the referral and allowing [place] to share in the care of your patient.   Torryn Hudspeth P. Florene Glen, [place], [facility] Certified Genetic Counselor [email or web address] phone: [phone]  The patient was seen for a total of 60 minutes in face-to-face genetic counseling.  This patient was discussed with Drs. Magrinat, Shawna [name] and/or Burr Medico who agrees with the above.    _______________________________________________________________________ For Office Staff:  Number of people involved in session: 2 Was an Intern/ student involved with case: no

## 2015-01-01 ENCOUNTER — Other Ambulatory Visit: Payer: Self-pay | Admitting: Surgery

## 2015-01-02 NOTE — Telephone Encounter (Signed)
LM - In regards to rescheduling appt. Call my office at [phone].

## 2015-01-06 ENCOUNTER — Ambulatory Visit (INDEPENDENT_AMBULATORY_CARE_PROVIDER_SITE_OTHER): Payer: Medicare Other | Admitting: Podiatry

## 2015-01-06 ENCOUNTER — Encounter: Payer: Self-pay | Admitting: Podiatry

## 2015-01-06 DIAGNOSIS — L84 Corns and callosities: Secondary | ICD-10-CM | POA: Diagnosis not present

## 2015-01-06 DIAGNOSIS — M216X2 Other acquired deformities of left foot: Secondary | ICD-10-CM

## 2015-01-06 NOTE — Patient Instructions (Signed)
Continue to wear year over-the-counter foot pads with the metatarsal raise on the top of the insole Today I attach an additional felt pad on the underside of the insole Replace the insole on a regular basis Return as needed for trimming of the calluses

## 2015-01-06 NOTE — Progress Notes (Signed)
Patient ID: Shawna [name], female   DOB: April 15, 1940, 74 y.o.   MRN: [id number]  Subjective: This patient presents complaining of painful callouses right and left feet and requests vitamin of these calluses. Patient comes periodically for debridement at her request at 4-6 month intervals. Patient currently wearing over-the-counter soft pads with metatarsal raise inside accommodative shoes with soft insoles. 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

## 2015-01-13 ENCOUNTER — Telehealth: Payer: Self-pay | Admitting: Genetic Counselor

## 2015-01-13 ENCOUNTER — Encounter: Payer: Self-pay | Admitting: Genetic Counselor

## 2015-01-13 DIAGNOSIS — Z1379 Encounter for other screening for genetic and chromosomal anomalies: Secondary | ICD-10-CM | POA: Insufficient documentation

## 2015-01-13 NOTE — Telephone Encounter (Signed)
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  A Likely pathogenic variant means that evidence suggests that it is a disease causing mutation and that we should follow her as if it is, but there is still evidence that is lacking.  The patient is deciding whether to come in for genetic counseling this coming Friday, 12/30 or the week before her surgery.  She will call her daughter to see if she can come up early and come to an appointment.

## 2015-01-14 ENCOUNTER — Encounter: Payer: Medicare Other | Admitting: Genetic Counselor

## 2015-01-16 ENCOUNTER — Other Ambulatory Visit: Payer: Self-pay | Admitting: *Deleted

## 2015-01-16 ENCOUNTER — Encounter: Payer: Self-pay | Admitting: *Deleted

## 2015-01-16 ENCOUNTER — Telehealth: Payer: Self-pay | Admitting: Hematology and Oncology

## 2015-01-16 DIAGNOSIS — C50311 Malignant neoplasm of lower-inner quadrant of right female breast: Secondary | ICD-10-CM

## 2015-01-16 NOTE — Telephone Encounter (Signed)
Chemo class scheduled and sent request echo precert

## 2015-01-20 ENCOUNTER — Telehealth: Payer: Self-pay | Admitting: Oncology

## 2015-01-20 ENCOUNTER — Telehealth: Payer: Self-pay | Admitting: Nurse Practitioner

## 2015-01-20 NOTE — Telephone Encounter (Signed)
Spoke with patient and we rescheduled her appointments

## 2015-01-20 NOTE — Telephone Encounter (Signed)
Patient was called and wished her well for upcoming breast cancer surgery that is scheduled for 02/03/15.  She feels well and does not feel that she has any other issues.  Any concerns to call back.

## 2015-01-22 ENCOUNTER — Telehealth: Payer: Self-pay | Admitting: *Deleted

## 2015-01-22 ENCOUNTER — Other Ambulatory Visit: Payer: Medicare Other

## 2015-01-22 NOTE — Telephone Encounter (Signed)
No additional note

## 2015-01-23 ENCOUNTER — Telehealth: Payer: Self-pay | Admitting: Hematology and Oncology

## 2015-01-23 NOTE — Telephone Encounter (Signed)
Spoke with patient and she is aware of her appointments,she may call and half to in to reschedule as she is in [place] right now and may not be able to get back due to weather

## 2015-01-28 ENCOUNTER — Telehealth: Payer: Self-pay | Admitting: Genetic Counselor

## 2015-01-28 ENCOUNTER — Encounter: Payer: Medicare Other | Admitting: Genetic Counselor

## 2015-01-28 NOTE — Telephone Encounter (Signed)
LM on VM at home and on cell that we need to r/s her appointment for tomorrow 01/29/2015.  I asked that she call back at [phone] to schedule this appointment at a time that is convenient.

## 2015-01-29 ENCOUNTER — Encounter (HOSPITAL_BASED_OUTPATIENT_CLINIC_OR_DEPARTMENT_OTHER)
Admission: RE | Admit: 2015-01-29 | Discharge: 2015-01-29 | Disposition: A | Discharge status: Home / Self Care | Payer: Medicare Other | Source: Ambulatory Visit | Attending: Surgery | Admitting: Surgery

## 2015-01-29 ENCOUNTER — Encounter: Payer: Medicare Other | Admitting: Genetic Counselor

## 2015-01-29 ENCOUNTER — Other Ambulatory Visit: Payer: Medicare Other

## 2015-01-29 ENCOUNTER — Encounter (HOSPITAL_BASED_OUTPATIENT_CLINIC_OR_DEPARTMENT_OTHER): Payer: Self-pay | Admitting: *Deleted

## 2015-01-29 DIAGNOSIS — C50311 Malignant neoplasm of lower-inner quadrant of right female breast: Secondary | ICD-10-CM | POA: Insufficient documentation

## 2015-01-29 DIAGNOSIS — Z01812 Encounter for preprocedural laboratory examination: Secondary | ICD-10-CM | POA: Insufficient documentation

## 2015-01-29 DIAGNOSIS — Z01818 Encounter for other preprocedural examination: Secondary | ICD-10-CM | POA: Insufficient documentation

## 2015-01-29 DIAGNOSIS — I10 Essential (primary) hypertension: Secondary | ICD-10-CM | POA: Insufficient documentation

## 2015-01-29 DIAGNOSIS — I493 Ventricular premature depolarization: Secondary | ICD-10-CM | POA: Insufficient documentation

## 2015-01-29 LAB — BASIC METABOLIC PANEL
Anion gap: 8 (ref 5–15)
BUN: 29 mg/dL — ABNORMAL HIGH (ref 6–20)
CO2: 26 mmol/L (ref 22–32)
CREATININE: 1.12 mg/dL — AB (ref 0.44–1.00)
Calcium: 9.6 mg/dL (ref 8.9–10.3)
Chloride: 108 mmol/L (ref 101–111)
GFR calc Af Amer: 55 mL/min — ABNORMAL LOW (ref >60)
GFR calc non Af Amer: 48 mL/min — ABNORMAL LOW (ref >60)
GLUCOSE: 89 mg/dL (ref 65–99)
Potassium: 4.5 mmol/L (ref 3.5–5.1)
Sodium: 142 mmol/L (ref 135–145)

## 2015-01-30 ENCOUNTER — Ambulatory Visit (HOSPITAL_COMMUNITY)
Admission: RE | Admit: 2015-01-30 | Discharge: 2015-01-30 | Disposition: A | Discharge status: Home / Self Care | Payer: Medicare Other | Source: Ambulatory Visit | Attending: Hematology and Oncology | Admitting: Hematology and Oncology

## 2015-01-30 DIAGNOSIS — C50311 Malignant neoplasm of lower-inner quadrant of right female breast: Secondary | ICD-10-CM

## 2015-01-30 DIAGNOSIS — Z01818 Encounter for other preprocedural examination: Secondary | ICD-10-CM | POA: Diagnosis not present

## 2015-01-30 NOTE — Progress Notes (Signed)
Echocardiogram 2D Echocardiogram has been performed.  Tresa Res 01/30/2015, 10:49 AM

## 2015-02-02 ENCOUNTER — Encounter: Payer: Self-pay | Admitting: Genetic Counselor

## 2015-02-02 ENCOUNTER — Ambulatory Visit (HOSPITAL_BASED_OUTPATIENT_CLINIC_OR_DEPARTMENT_OTHER): Payer: Medicare Other | Admitting: Genetic Counselor

## 2015-02-02 DIAGNOSIS — Z803 Family history of malignant neoplasm of breast: Secondary | ICD-10-CM | POA: Diagnosis not present

## 2015-02-02 DIAGNOSIS — Z800 Family history of malignant neoplasm of digestive organs: Secondary | ICD-10-CM

## 2015-02-02 DIAGNOSIS — Z315 Encounter for genetic counseling: Secondary | ICD-10-CM

## 2015-02-02 DIAGNOSIS — Z1501 Genetic susceptibility to malignant neoplasm of breast: Secondary | ICD-10-CM | POA: Insufficient documentation

## 2015-02-02 DIAGNOSIS — C50311 Malignant neoplasm of lower-inner quadrant of right female breast: Secondary | ICD-10-CM | POA: Diagnosis not present

## 2015-02-02 DIAGNOSIS — Z1379 Encounter for other screening for genetic and chromosomal anomalies: Secondary | ICD-10-CM

## 2015-02-02 DIAGNOSIS — Z1589 Genetic susceptibility to other disease: Principal | ICD-10-CM

## 2015-02-02 DIAGNOSIS — Z1509 Genetic susceptibility to other malignant neoplasm: Principal | ICD-10-CM

## 2015-02-02 NOTE — Progress Notes (Signed)
GENETIC TEST RESULTS   Patient Name: RAELEE [name] Patient Age: 75 y.o. Encounter Date: 02/02/2015  Referring Provider: [name], MD    Shawna [name] was seen in the [place] clinic on February 02, 2015 due to a personal and [name] history of cancer and concern regarding a hereditary predisposition to cancer in the [name]. Please refer to the prior Genetics clinic note for more information regarding Shawna [name]'s medical and [name] histories and our assessment at the time.   GENETIC TESTING:  At the time of Shawna [name] visit, we recommended she pursue genetic testing of the Breast/Ovarian cancer gene panel.  The Breast/Ovarian gene panel offered by [facility] includes sequencing and rearrangement analysis for the following 20 genes:  ATM, BARD1, BRCA1, BRCA2, BRIP1, CDH1, CHEK2, EPCAM, FANCC, MLH1, MSH2, MSH6, NBN, PALB2, PMS2, PTEN, RAD51C, RAD51D, TP53, and XRCC2.   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  Background Regarding the NBN gene:  The normal function of the NBN gene is to help protect our bodies from cancer; the NBN works with other genes in a complex and is important in the repair of DNA that becomes damaged during normal DNA replication. We all inherit two copies of the NBN gene, one from each parent. If there is a specific change (mutation) in the NBN gene that stops the gene from working properly, this may inhibit the cell's ability to correct mistakes in the DNA, thereby permitting more mutations to accumulate, which can increase individual's risk to develop cancer in their lifetime.   Interpretation of Results:  Peer-reviewed research suggests that women who have inherited a mutation in the NBN gene may have an increased lifetime risk of developing breast cancer over that of a woman in the general population (general population lifetime risk for breast cancer is 12.5%), however data is still limited regarding the specific lifetime risks for  cancer and the average age of onset of cancer. Clinical genetic testing for mutations in the NBN gene have only been available in the last several years in the context of hereditary cancer risk assessment and more research is needed to clarify the lifetime risks for cancers associated with inherited mutations in this gene.   Different mutations in the NBN gene may confer different lifetime risks for cancer. The available evidence is based on a limited number of studies and some studies have involved a small number of patients or patients from a specific ethnic group. For example, there is one particular mutation in the NBN gene which is more common to individuals of [place] ancestry (this is not the mutation that was identified in Shawna [name]) and some studies suggest this mutation is associated with a 2-3 fold risk for breast cancer (up to 30% lifetime risk) as compared to the general population (12.5%).  However, it is possible that the lifetime risk for breast cancer for women who carry this mutation in the NBN gene who also have a strong [name] history of early-onset breast cancers is greater than that of a carrier of the same mutation who has little or no [name] history of such cancers. Thus, other genetic and/or environmental risk factors may modify the NBN gene. Men who have inherited an NBN mutation may be at a slightly increased risk for prostate cancer, but data is not conclusive and there are no current recommendations for increased prostate cancer screening over that of the general population. Mutations in the NBN gene are not known to be associated with colon cancer  risk. Further research about the NBN gene is expected to be available in the coming years, so recommendations may evolve.   Recommendations for Cancer Screening:  Current national guidelines [employer] (NCCN) guidelines) suggest the following screening for breast surveillance:   Annual mammogram and  consider breast MRI with contrast at age 55  At this writing, the evidence is insufficient to recommend a risk reducing mastectomy, unless the risk for breast cancer based on [name] history warrants discussion.   Additionally, there is no evidence to suggest an increased risk for ovarian cancer.  Men with NBN mutations may be at an increased risk for prostate cancer.  Unfortunately, not all insurance programs will recognize the NCCN guidelines recommendations for breast MRI based on the information known about NBN and that it is a more newly described hereditary breast cancer candidate gene.  However, if a person is calculated to have a greater than 20% lifetime risk to develop breast cancer based on [name] history information alone or other personal factors (such as certain types of benign breast disease, such as atypical duct hyperplasia (ADH), for example), breast MRI screening may be considered.  in the form of annual breast MRI screening (in addition to annual mammography) for individuals who have a NBN mutation. However, if a person is calculated to have a greater than 20% lifetime risk to develop breast cancer based on [name] history information alone or other personal factors (such as certain types of benign breast disease, such as atypical duct hyperplasia (ADH), for example), breast MRI screening may be considered.   [name] Members: It is important that all of Shawna [name]'s relatives (both men and women) know of the presence of this gene mutation. Site-specific genetic testing can sort out who in the [name] is at risk and who is not.   Shawna [name] children and siblings have a 50% chance to have inherited this mutation. We recommend they have genetic testing for this same mutation, as identifying the presence of this mutation would allow them to also take advantage of risk-reducing measures. It would also allow them to understand their reproductive risks for having a child with a recessive  condition called Nijmegan Breakage Syndrome (NBS).  A note on Nijmegan Breakage Syndrome:  The following may not be specifically relevant at this time to Shawna [name] but I would be remiss in not including this information in this letter. If an individual inherits two mutations in the NBN gene (one from each parent) this can lead to a condition called Nijemegan Breakage Syndrome (NBS), which is a very rare autosomal recessive condition involving a variety of developmental abnormalities including microcephaly (small head size), immune deficiency predisposition to leukemia or lymphoma. When an individual and her/his partner both carry an NBN mutation, their chance to have a child affected with NBS is 25%. For [name] planning purposes, genetic counseling regarding NBS may be considered for individuals of reproductive age in at-risk families. Of note, Shawna [name] reported no [name] history of the conditions associated with NBS.   Please Keep in Touch: As more individuals undergo genetic testing and more information is gathered on carriers of mutations in the NBN gene, screening and/or recommendations may change. For example, if screening guidelines change to recommend breast MRI screening for all NBN carriers, this may be helpful to Shawna [name] with regard to insurance coverage for the cost of this type of breast surveillance. I urged Shawna [name] to keep in touch so that new management recommendations may be  discussed as they become available. It is important to note that this genetic risk assessment is based on Shawna [name] [name] and medical history information as provided by her at the time of her visit. With the rapid pace of medical research and improvements in technology, new discoveries may modify this assessment; therefore, I encouraged Shawna [name] to re-contact me periodically for information on advances in the field or if there are any updates, new diagnoses or corrections to Ms.  Shanafelt's [name] history information.   SUPPORT AND RESOURCES: If Ms. Governale is interested in hereditary breast cancer-specific information and support, there are two groups, Facing Our Risk (www.facingourrisk.com) and Bright Pink (www.brightpink.org) which some people have found useful. They provide opportunities to speak with other individuals from high-risk families. So that her daughter may know who to contact in the [place], [place] area to undergo genetic testing I provided her a print out of genetic counselors at the [facility] with contact information.    We encouraged Ms. Sarinana to remain in contact with [place] on an annual basis so we can update her personal and [name] histories, and let her know of advances in cancer genetics that may benefit the [name]. Our contact number was provided. Ms. Lackey questions were answered to her satisfaction today, and she knows she is welcome to call anytime with additional questions.   Densil Ottey P. Florene Glen, [place], [facility] Certified Genetic Counselor [email or web address] phone: [phone]

## 2015-02-02 NOTE — H&P (Signed)
Shawna [name]  Location: [facility] Patient #: [id number] DOB: 22-Mar-1940 Widowed / Language: Cleophus [name] / Race: White Female  History of Present Illness   The patient is a 75 year old female who presents with breast cancer.   His PCP is Dr. Aviva Signs.  Her GYN caretaker is Edman Circle, NP.  She comes with her daughter, Shawna [name].   She had a benign right breast biopsy in Jan 2014. This new disease was found on a routine mammogram. She felt no mass.  She comes here for a right breast cancer. She had mammograms at [facility] on 12/02/2014 which showed a 1.0 cm mass in the lower inner quadrant of the right breast. She had a biopsy of her right breast on 12/09/2014 [phone]) which showed IDC of the right breast, ER - 0%, PR - 0%, Ki67 - 90%, Her2Neu neg.  Her mother and sister had breast cancer. She has another sister who died of colon ca.  She is on no hormone therapy.  Plan: 1) see med oncology for probable need for chemotx, 2) right breast lumpectomy, SLNBx, and probable port placement (after seeing oncology), 3) genetics counseling  PMH: 1. HTN 2. hypercholesterolemia 3. benign tumor of ovary removed remotely 4. History of kidney stones Has had lithotripsy. Sees Dr. Risa Grill.  Social history:  Widowed. Her husband died suddenly of a MI (?) about 15 years ago. Her daughter, Shawna [name], is with her. She also has a son. Both her children live in [place]. She is planning to retire from [employer] at the end of this year (2016)   Other Problems Shawna [name], [place]; 12/19/2014 8:52 AM) Breast Cancer Gastroesophageal Reflux Disease High blood pressure Hypercholesterolemia Kidney Stone Melanoma  Past Surgical History Shawna [name], CMA; 12/19/2014 8:52 AM) Breast Biopsy Right.  Diagnostic Studies History Shawna [name], [place]; 12/19/2014 8:52 AM) Colonoscopy 1-5 years ago Mammogram within last year Pap Smear 1-5 years ago  Allergies  Shawna [name], CMA; 12/19/2014 8:52 AM) No Known Drug Allergies12/02/2014  Medication History Shawna [name], CMA; 12/19/2014 8:53 AM) ALPRAZolam (0.25MG Tablet, Oral) Active. Crestor (20MG Tablet, Oral) Active. Telmisartan-HCTZ (80-12.5MG Tablet, Oral) Active. VESIcare (5MG Tablet, Oral) Active. Vitamin D (2000UNIT Tablet, Oral) Active. Calcium Carbonate (600MG Tablet, Oral) Active. Medications Reconciled  Social History Shawna [name], [place]; 12/19/2014 8:52 AM) Alcohol use Occasional alcohol use. Caffeine use Coffee. No drug use Tobacco use Former smoker.  Family History Shawna [name], [place]; 12/19/2014 8:52 AM) Breast Cancer Mother, Sister. Cerebrovascular Accident Mother. Colon Cancer Sister. Heart Disease Father. Melanoma Brother. Migraine Headache Daughter.  Pregnancy / Birth History Shawna [name], CMA; 12/19/2014 8:52 AM) Age at menarche 60 years. Age of menopause <47 Contraceptive History Oral contraceptives. Gravida 2 Para 2    Review of Systems Shawna [name] CMA; 12/19/2014 8:52 AM) General Not Present- Appetite Loss, Chills, Fatigue, Fever, Night Sweats, Weight Gain and Weight Loss. Skin Not Present- Change in Wart/Mole, Dryness, Hives, Jaundice, New Lesions, Non-Healing Wounds, Rash and Ulcer. Respiratory Not Present- Bloody sputum, Chronic Cough, Difficulty Breathing, Snoring and Wheezing. Breast Present- Breast Mass. Not Present- Breast Pain, Nipple Discharge and Skin Changes. Gastrointestinal Not Present- Abdominal Pain, Bloating, Bloody Stool, Change in Bowel Habits, Chronic diarrhea, Constipation, Difficulty Swallowing, Excessive gas, Gets full quickly at meals, Hemorrhoids, Indigestion, Nausea, Rectal Pain and Vomiting. Female Genitourinary Present- Urgency. Not Present- Frequency, Nocturia, Painful Urination and Pelvic Pain. Endocrine Not Present- Cold Intolerance, Excessive Hunger, Hair Changes, Heat Intolerance, Hot flashes and New  Diabetes. Hematology Not Present- Easy Bruising,  Excessive bleeding, Gland problems, HIV and Persistent Infections.  Vitals Shawna [name] CMA; 12/19/2014 8:53 AM) 12/19/2014 8:53 AM Weight: 137.2 lb Height: 68.5in Body Surface Area: 1.75 m Body Mass Index: 20.56 kg/m  Temp.: 97.59F(Temporal)  Pulse: 81 (Regular)  BP: 128/68 (Sitting, Left Arm, Standard)   Physical Exam General: WN older WF alert and generally healthy appearing. HEENT: Normal. Pupils equal.  Neck: Supple. No mass. No thyroid mass. Lymph Nodes: No supraclavicular or cervical or axillary nodes.  Lungs: Clear to auscultation and symmetric breath sounds. Heart: RRR. No murmur or rub.  Breast: Right - bruise at 5 o'clock  Left - no mass  Abdomen: Soft. No mass. No tenderness. No hernia. Normal bowel sounds. Lower abdominal scar.  Extremities: Good strength and ROM in upper and lower extremities.  Neurologic: Grossly intact to motor and sensory function. Psychiatric: Has normal mood and affect. Behavior is normal.   Assessment & Plan  1.  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 Impression: Plan:   1) see med oncology for probable need for chemotx and see rad onc   2) right breast lumpectomy, SLNBx, and probable port placement (after seeing oncology),   3) genetics counseling  Addendum Note(Maryella Abood H. Lucia Gaskins MD; 12/31/2014 3:25 PM)  She saw Dr. Lindi Adie and [name]. She does plan to get chemotx.  She will need a power port. She has also seen genetics and wants those results back before surgery.  She is retiring at the end of year and wants to wait until next year to get her surgery.  We will talk on the phone prior to surgery to review the genetic results.  I will schedule her surgery for mid Jan 2017.  2.  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  For now, survellience is the recommendation.  3. HTN 4. hypercholesterolemia 5. benign tumor of ovary removed remotely 6. History of kidney stones  Has had lithotripsy. Sees Dr. Risa Grill.   [name], MD, [facility] Surgery Pager: [phone] Office phone:  [phone]

## 2015-02-03 ENCOUNTER — Ambulatory Visit (HOSPITAL_BASED_OUTPATIENT_CLINIC_OR_DEPARTMENT_OTHER)
Admission: RE | Admit: 2015-02-03 | Discharge: 2015-02-03 | Disposition: A | Discharge status: Home / Self Care | Payer: Medicare Other | Source: Ambulatory Visit | Attending: Surgery | Admitting: Surgery

## 2015-02-03 ENCOUNTER — Ambulatory Visit (HOSPITAL_BASED_OUTPATIENT_CLINIC_OR_DEPARTMENT_OTHER): Payer: Medicare Other | Admitting: Anesthesiology

## 2015-02-03 ENCOUNTER — Encounter (HOSPITAL_COMMUNITY)
Admission: RE | Admit: 2015-02-03 | Discharge: 2015-02-03 | Disposition: A | Discharge status: Home / Self Care | Payer: Medicare Other | Source: Ambulatory Visit | Attending: Surgery | Admitting: Surgery

## 2015-02-03 ENCOUNTER — Encounter (HOSPITAL_BASED_OUTPATIENT_CLINIC_OR_DEPARTMENT_OTHER)
Admission: RE | Disposition: A | Discharge status: Home / Self Care | Payer: Self-pay | Source: Ambulatory Visit | Attending: Surgery

## 2015-02-03 ENCOUNTER — Ambulatory Visit (HOSPITAL_COMMUNITY): Payer: Medicare Other

## 2015-02-03 ENCOUNTER — Encounter (HOSPITAL_BASED_OUTPATIENT_CLINIC_OR_DEPARTMENT_OTHER): Payer: Self-pay

## 2015-02-03 DIAGNOSIS — C50911 Malignant neoplasm of unspecified site of right female breast: Secondary | ICD-10-CM | POA: Insufficient documentation

## 2015-02-03 DIAGNOSIS — Z95828 Presence of other vascular implants and grafts: Secondary | ICD-10-CM

## 2015-02-03 DIAGNOSIS — I10 Essential (primary) hypertension: Secondary | ICD-10-CM | POA: Diagnosis not present

## 2015-02-03 DIAGNOSIS — C50311 Malignant neoplasm of lower-inner quadrant of right female breast: Secondary | ICD-10-CM | POA: Insufficient documentation

## 2015-02-03 DIAGNOSIS — E7800 Pure hypercholesterolemia, unspecified: Secondary | ICD-10-CM | POA: Insufficient documentation

## 2015-02-03 DIAGNOSIS — Z419 Encounter for procedure for purposes other than remedying health state, unspecified: Secondary | ICD-10-CM

## 2015-02-03 DIAGNOSIS — Z803 Family history of malignant neoplasm of breast: Secondary | ICD-10-CM | POA: Diagnosis not present

## 2015-02-03 HISTORY — PX: BREAST LUMPECTOMY WITH RADIOACTIVE SEED AND SENTINEL LYMPH NODE BIOPSY: SHX6550

## 2015-02-03 HISTORY — PX: PORTACATH PLACEMENT: SHX2246

## 2015-02-03 HISTORY — DX: Unspecified malignant neoplasm of skin of nose: C44.301

## 2015-02-03 SURGERY — BREAST LUMPECTOMY WITH RADIOACTIVE SEED AND SENTINEL LYMPH NODE BIOPSY
Anesthesia: Regional | Site: Chest | Laterality: Right

## 2015-02-03 MED ORDER — CEFAZOLIN SODIUM-DEXTROSE 2-3 GM-% IV SOLR
2.0000 g | INTRAVENOUS | Status: AC
  Administered 2015-02-03: 2 g via INTRAVENOUS

## 2015-02-03 MED ORDER — BUPIVACAINE HCL (PF) 0.25 % IJ SOLN
INTRAMUSCULAR | Status: AC
  Filled 2015-02-03: qty 30

## 2015-02-03 MED ORDER — HEPARIN (PORCINE) IN NACL 2-0.9 UNIT/ML-% IJ SOLN
INTRAMUSCULAR | Status: AC
  Filled 2015-02-03: qty 500

## 2015-02-03 MED ORDER — PROMETHAZINE HCL 25 MG/ML IJ SOLN: 6.2500 mg | INTRAMUSCULAR | Status: DC | PRN

## 2015-02-03 MED ORDER — DEXAMETHASONE SODIUM PHOSPHATE 4 MG/ML IJ SOLN
INTRAMUSCULAR | Status: DC | PRN
  Administered 2015-02-03: 10 mg via INTRAVENOUS

## 2015-02-03 MED ORDER — LIDOCAINE HCL (CARDIAC) 20 MG/ML IV SOLN
INTRAVENOUS | Status: AC
  Filled 2015-02-03: qty 5

## 2015-02-03 MED ORDER — CHLORHEXIDINE GLUCONATE 4 % EX LIQD: 1.0000 "application " | Freq: Once | CUTANEOUS | Status: DC

## 2015-02-03 MED ORDER — BUPIVACAINE-EPINEPHRINE (PF) 0.25% -1:200000 IJ SOLN
INTRAMUSCULAR | Status: AC
  Filled 2015-02-03: qty 60

## 2015-02-03 MED ORDER — BUPIVACAINE-EPINEPHRINE (PF) 0.5% -1:200000 IJ SOLN
INTRAMUSCULAR | Status: DC | PRN
  Administered 2015-02-03: 25 mL

## 2015-02-03 MED ORDER — HYDROMORPHONE HCL 1 MG/ML IJ SOLN: 0.2500 mg | INTRAMUSCULAR | Status: DC | PRN

## 2015-02-03 MED ORDER — TECHNETIUM TC 99M SULFUR COLLOID FILTERED
1.0000 | Freq: Once | INTRAVENOUS | Status: AC | PRN
Start: 2015-02-03 — End: 2015-02-03
  Administered 2015-02-03: 1 via INTRADERMAL

## 2015-02-03 MED ORDER — SCOPOLAMINE 1 MG/3DAYS TD PT72: 1.0000 | MEDICATED_PATCH | Freq: Once | TRANSDERMAL | Status: DC | PRN

## 2015-02-03 MED ORDER — DEXAMETHASONE SODIUM PHOSPHATE 10 MG/ML IJ SOLN
INTRAMUSCULAR | Status: AC
  Filled 2015-02-03: qty 1

## 2015-02-03 MED ORDER — METHYLENE BLUE 1 % INJ SOLN
INTRAMUSCULAR | Status: AC
  Filled 2015-02-03: qty 10

## 2015-02-03 MED ORDER — EPHEDRINE SULFATE 50 MG/ML IJ SOLN
INTRAMUSCULAR | Status: AC
  Filled 2015-02-03: qty 1

## 2015-02-03 MED ORDER — HYDROCODONE-ACETAMINOPHEN 5-325 MG PO TABS: 1.0000 | ORAL_TABLET | Freq: Four times a day (QID) | ORAL | Status: DC | PRN

## 2015-02-03 MED ORDER — LIDOCAINE HCL (PF) 1 % IJ SOLN
INTRAMUSCULAR | Status: AC
  Filled 2015-02-03: qty 30

## 2015-02-03 MED ORDER — HEPARIN SOD (PORK) LOCK FLUSH 100 UNIT/ML IV SOLN
INTRAVENOUS | Status: DC | PRN
  Administered 2015-02-03: 500 [IU] via INTRAVENOUS

## 2015-02-03 MED ORDER — FENTANYL CITRATE (PF) 100 MCG/2ML IJ SOLN
50.0000 ug | INTRAMUSCULAR | Status: AC | PRN
  Administered 2015-02-03 (×2): 50 ug via INTRAVENOUS
  Administered 2015-02-03: 25 ug via INTRAVENOUS
  Administered 2015-02-03: 50 ug via INTRAVENOUS

## 2015-02-03 MED ORDER — ONDANSETRON HCL 4 MG/2ML IJ SOLN
INTRAMUSCULAR | Status: AC
  Filled 2015-02-03: qty 2

## 2015-02-03 MED ORDER — HEPARIN SOD (PORK) LOCK FLUSH 100 UNIT/ML IV SOLN
INTRAVENOUS | Status: AC
  Filled 2015-02-03: qty 5

## 2015-02-03 MED ORDER — EPHEDRINE SULFATE 50 MG/ML IJ SOLN
INTRAMUSCULAR | Status: DC | PRN
  Administered 2015-02-03 (×2): 10 mg via INTRAVENOUS

## 2015-02-03 MED ORDER — MIDAZOLAM HCL 2 MG/2ML IJ SOLN
INTRAMUSCULAR | Status: AC
  Filled 2015-02-03: qty 2

## 2015-02-03 MED ORDER — CEFAZOLIN SODIUM-DEXTROSE 2-3 GM-% IV SOLR
INTRAVENOUS | Status: AC
  Filled 2015-02-03: qty 50

## 2015-02-03 MED ORDER — BUPIVACAINE-EPINEPHRINE 0.25% -1:200000 IJ SOLN
INTRAMUSCULAR | Status: DC | PRN
  Administered 2015-02-03: 20 mL

## 2015-02-03 MED ORDER — PROPOFOL 500 MG/50ML IV EMUL
INTRAVENOUS | Status: AC
  Filled 2015-02-03: qty 50

## 2015-02-03 MED ORDER — HYDROCODONE-ACETAMINOPHEN 7.5-325 MG PO TABS: 1.0000 | ORAL_TABLET | Freq: Once | ORAL | Status: DC | PRN

## 2015-02-03 MED ORDER — MIDAZOLAM HCL 2 MG/2ML IJ SOLN
1.0000 mg | INTRAMUSCULAR | Status: DC | PRN
  Administered 2015-02-03 (×3): 1 mg via INTRAVENOUS

## 2015-02-03 MED ORDER — GLYCOPYRROLATE 0.2 MG/ML IJ SOLN: 0.2000 mg | Freq: Once | INTRAMUSCULAR | Status: DC | PRN

## 2015-02-03 MED ORDER — SUCCINYLCHOLINE CHLORIDE 20 MG/ML IJ SOLN
INTRAMUSCULAR | Status: AC
  Filled 2015-02-03: qty 1

## 2015-02-03 MED ORDER — PHENYLEPHRINE HCL 10 MG/ML IJ SOLN
INTRAMUSCULAR | Status: DC | PRN
  Administered 2015-02-03: 80 ug via INTRAVENOUS
  Administered 2015-02-03 (×2): 40 ug via INTRAVENOUS

## 2015-02-03 MED ORDER — HEPARIN (PORCINE) IN NACL 2-0.9 UNIT/ML-% IJ SOLN
INTRAMUSCULAR | Status: DC | PRN
  Administered 2015-02-03: 1 via INTRAVENOUS

## 2015-02-03 MED ORDER — LACTATED RINGERS IV SOLN
INTRAVENOUS | Status: DC
  Administered 2015-02-03 (×2): via INTRAVENOUS

## 2015-02-03 MED ORDER — ATROPINE SULFATE 0.4 MG/ML IJ SOLN
INTRAMUSCULAR | Status: AC
  Filled 2015-02-03: qty 1

## 2015-02-03 MED ORDER — FENTANYL CITRATE (PF) 100 MCG/2ML IJ SOLN
INTRAMUSCULAR | Status: AC
  Filled 2015-02-03: qty 2

## 2015-02-03 MED ORDER — PHENYLEPHRINE 40 MCG/ML (10ML) SYRINGE FOR IV PUSH (FOR BLOOD PRESSURE SUPPORT)
PREFILLED_SYRINGE | INTRAVENOUS | Status: AC
  Filled 2015-02-03: qty 10

## 2015-02-03 MED ORDER — ONDANSETRON HCL 4 MG/2ML IJ SOLN
INTRAMUSCULAR | Status: DC | PRN
  Administered 2015-02-03: 4 mg via INTRAVENOUS

## 2015-02-03 MED ORDER — LIDOCAINE HCL (CARDIAC) 20 MG/ML IV SOLN
INTRAVENOUS | Status: DC | PRN
  Administered 2015-02-03: 25 mg via INTRAVENOUS

## 2015-02-03 MED ORDER — PROPOFOL 10 MG/ML IV BOLUS
INTRAVENOUS | Status: DC | PRN
  Administered 2015-02-03: 150 mg via INTRAVENOUS

## 2015-02-03 SURGICAL SUPPLY — 70 items
APL SKNCLS STERI-STRIP NONHPOA (GAUZE/BANDAGES/DRESSINGS) ×2
BAG DECANTER FOR FLEXI CONT (MISCELLANEOUS) ×3 IMPLANT
BENZOIN TINCTURE PRP APPL 2/3 (GAUZE/BANDAGES/DRESSINGS) ×3 IMPLANT
BINDER BREAST LRG (GAUZE/BANDAGES/DRESSINGS) ×3 IMPLANT
BINDER BREAST MEDIUM (GAUZE/BANDAGES/DRESSINGS) IMPLANT
BINDER BREAST XLRG (GAUZE/BANDAGES/DRESSINGS) IMPLANT
BINDER BREAST XXLRG (GAUZE/BANDAGES/DRESSINGS) IMPLANT
BLADE SURG 15 STRL LF DISP TIS (BLADE) ×2 IMPLANT
BLADE SURG 15 STRL SS (BLADE) ×3
CANISTER SUC SOCK COL 7IN (MISCELLANEOUS) IMPLANT
CANISTER SUCT 1200ML W/VALVE (MISCELLANEOUS) ×3 IMPLANT
CHLORAPREP W/TINT 26ML (MISCELLANEOUS) ×6 IMPLANT
CLEANER CAUTERY TIP 5X5 PAD (MISCELLANEOUS) ×2 IMPLANT
CLIP TI WIDE RED SMALL 6 (CLIP) ×3 IMPLANT
COVER BACK TABLE 60X90IN (DRAPES) ×3 IMPLANT
COVER MAYO STAND STRL (DRAPES) ×3 IMPLANT
COVER PROBE 5X48 (MISCELLANEOUS)
COVER PROBE W GEL 5X96 (DRAPES) ×3 IMPLANT
DECANTER SPIKE VIAL GLASS SM (MISCELLANEOUS) ×3 IMPLANT
DEVICE DUBIN W/COMP PLATE 8390 (MISCELLANEOUS) ×3 IMPLANT
DRAPE C-ARM 42X72 X-RAY (DRAPES) ×3 IMPLANT
DRAPE LAPAROSCOPIC ABDOMINAL (DRAPES) ×3 IMPLANT
DRAPE LAPAROTOMY T 102X78X121 (DRAPES) ×3 IMPLANT
DRAPE UTILITY XL STRL (DRAPES) ×3 IMPLANT
DRSG PAD ABDOMINAL 8X10 ST (GAUZE/BANDAGES/DRESSINGS) IMPLANT
ELECT COATED BLADE 2.86 ST (ELECTRODE) IMPLANT
ELECT REM PT RETURN 9FT ADLT (ELECTROSURGICAL) ×3
ELECTRODE REM PT RTRN 9FT ADLT (ELECTROSURGICAL) ×2 IMPLANT
GAUZE SPONGE 4X4 12PLY STRL (GAUZE/BANDAGES/DRESSINGS) ×3 IMPLANT
GLOVE BIO SURGEON STRL SZ 6.5 (GLOVE) ×6 IMPLANT
GLOVE BIOGEL PI IND STRL 7.0 (GLOVE) ×4 IMPLANT
GLOVE BIOGEL PI INDICATOR 7.0 (GLOVE) ×2
GLOVE SURG SIGNA 7.5 PF LTX (GLOVE) ×9 IMPLANT
GOWN STRL REUS W/ TWL LRG LVL3 (GOWN DISPOSABLE) ×6 IMPLANT
GOWN STRL REUS W/ TWL XL LVL3 (GOWN DISPOSABLE) ×4 IMPLANT
GOWN STRL REUS W/TWL LRG LVL3 (GOWN DISPOSABLE) ×9
GOWN STRL REUS W/TWL XL LVL3 (GOWN DISPOSABLE) ×6
IV CATH AUTO 14GX1.75 SAFE ORG (IV SOLUTION) IMPLANT
IV CATH PLACEMENT UNIT 16 GA (IV SOLUTION) IMPLANT
IV KIT MINILOC 20X1 SAFETY (NEEDLE) IMPLANT
KIT CVR 48X5XPRB PLUP LF (MISCELLANEOUS) IMPLANT
KIT MARKER MARGIN INK (KITS) ×3 IMPLANT
KIT PORT POWER 8FR ISP CVUE (Catheter) ×3 IMPLANT
LIQUID BAND (GAUZE/BANDAGES/DRESSINGS) ×6 IMPLANT
NDL SAFETY ECLIPSE 18X1.5 (NEEDLE) IMPLANT
NEEDLE HYPO 18GX1.5 SHARP (NEEDLE)
NEEDLE HYPO 25X1 1.5 SAFETY (NEEDLE) ×3 IMPLANT
NS IRRIG 1000ML POUR BTL (IV SOLUTION) ×3 IMPLANT
PACK BASIN DAY SURGERY FS (CUSTOM PROCEDURE TRAY) ×3 IMPLANT
PAD CLEANER CAUTERY TIP 5X5 (MISCELLANEOUS) ×1
PENCIL BUTTON HOLSTER BLD 10FT (ELECTRODE) ×6 IMPLANT
SET SHEATH INTRODUCER 10FR (MISCELLANEOUS) IMPLANT
SHEATH COOK PEEL AWAY SET 9F (SHEATH) IMPLANT
SHEET MEDIUM DRAPE 40X70 STRL (DRAPES) ×3 IMPLANT
SLEEVE SCD COMPRESS KNEE MED (MISCELLANEOUS) ×3 IMPLANT
SPONGE GAUZE 4X4 12PLY STER LF (GAUZE/BANDAGES/DRESSINGS) ×6 IMPLANT
SPONGE LAP 18X18 X RAY DECT (DISPOSABLE) ×3 IMPLANT
STRIP CLOSURE SKIN 1/2X4 (GAUZE/BANDAGES/DRESSINGS) IMPLANT
STRIP CLOSURE SKIN 1/4X4 (GAUZE/BANDAGES/DRESSINGS) ×3 IMPLANT
SUT ETHILON 2 0 FS 18 (SUTURE) IMPLANT
SUT ETHILON 3 0 PS 1 (SUTURE) IMPLANT
SUT MNCRL AB 4-0 PS2 18 (SUTURE) ×6 IMPLANT
SUT MON AB 5-0 PS2 18 (SUTURE) ×3 IMPLANT
SUT VICRYL 3-0 CR8 SH (SUTURE) ×6 IMPLANT
SYR 5ML LUER SLIP (SYRINGE) ×3 IMPLANT
SYR CONTROL 10ML LL (SYRINGE) ×6 IMPLANT
TOWEL OR 17X24 6PK STRL BLUE (TOWEL DISPOSABLE) ×6 IMPLANT
TOWEL OR NON WOVEN STRL DISP B (DISPOSABLE) ×3 IMPLANT
TUBE CONNECTING 20X1/4 (TUBING) ×3 IMPLANT
YANKAUER SUCT BULB TIP NO VENT (SUCTIONS) ×3 IMPLANT

## 2015-02-03 NOTE — Anesthesia Procedure Notes (Addendum)
Anesthesia Regional Block:  Pectoralis block  Pre-Anesthetic Checklist: ,, timeout performed, Correct Patient, Correct Site, Correct Laterality, Correct Procedure, Correct Position, site marked, Risks and benefits discussed,  Surgical consent,  Pre-op evaluation,  At surgeon's request and post-op pain management  Laterality: Right  Prep: chloraprep       Needles:   Needle Type: Echogenic Needle     Needle Length: 9cm 9 cm Needle Gauge: 21 and 21 G    Additional Needles:  Procedures: ultrasound guided (picture in chart) Pectoralis block Narrative:  Start time: 02/03/2015 7:02 AM End time: 02/03/2015 7:11 AM Injection made incrementally with aspirations every 5 mL.  Performed by: Personally  Anesthesiologist: Suzette Battiest  Additional Notes: Risks and benefits discussed. Pt tolerated well with no immediate complications.   Procedure Name: LMA Insertion Date/Time: 02/03/2015 7:42 AM Performed by: Melynda Ripple D Pre-anesthesia Checklist: Patient identified, Emergency Drugs available, Suction available and Patient being monitored Patient Re-evaluated:Patient Re-evaluated prior to inductionOxygen Delivery Method: Circle System Utilized Preoxygenation: Pre-oxygenation with 100% oxygen Intubation Type: IV induction Ventilation: Mask ventilation without difficulty LMA: LMA inserted LMA Size: 3.0 Number of attempts: 1 Airway Equipment and Method: Bite block Placement Confirmation: positive ETCO2 Tube secured with: Tape Dental Injury: Teeth and Oropharynx as per pre-operative assessment

## 2015-02-03 NOTE — Transfer of Care (Signed)
Immediate Anesthesia Transfer of Care Note  Patient: Shawna [name]  Procedure(s) Performed: Procedure(s): RIGHT BREAST LUMPECTOMY WITH RADIOACTIVE SEED AND RIGHT SENTINEL LYMPH NODE BIOPSY (Right) INSERTION PORT-A-CATH WITH ULTRA SOUND GUIDANCE (Left)  Patient Location: PACU  Anesthesia Type:GA combined with regional for post-op pain  Level of Consciousness: awake, alert  and oriented  Airway & Oxygen Therapy: Patient Spontanous Breathing and Patient connected to face mask oxygen  Post-op Assessment: Report given to RN and Post -op Vital signs reviewed and stable  Post vital signs: Reviewed and stable  Last Vitals:  Filed Vitals:   02/03/15 0954 02/03/15 0955  BP:    Pulse: 89 88  Temp: 36.4 C   Resp:  11    Complications: No apparent anesthesia complications

## 2015-02-03 NOTE — Anesthesia Postprocedure Evaluation (Signed)
Anesthesia Post Note  Patient: Shawna [name]  Procedure(s) Performed: Procedure(s) (LRB): RIGHT BREAST LUMPECTOMY WITH RADIOACTIVE SEED AND RIGHT SENTINEL LYMPH NODE BIOPSY (Right) INSERTION PORT-A-CATH WITH ULTRA SOUND GUIDANCE (Left)  Patient location during evaluation: PACU Anesthesia Type: General and Regional Level of consciousness: awake and alert Pain management: pain level controlled Vital Signs Assessment: post-procedure vital signs reviewed and stable Respiratory status: spontaneous breathing Cardiovascular status: blood pressure returned to baseline Anesthetic complications: no    Last Vitals:  Filed Vitals:   02/03/15 1045 02/03/15 1130  BP: 153/81 161/80  Pulse: 73 72  Temp:  36.6 C  Resp: 13 16    Last Pain:  Filed Vitals:   02/03/15 1133  PainSc: 1                  [name]

## 2015-02-03 NOTE — Interval H&P Note (Signed)
History and Physical Interval Note:  02/03/2015 7:23 AM  Shawna [name]  has presented today for surgery, with the diagnosis of right breast cancer  The various methods of treatment have been discussed with the patient and family. Seed seen in the right breast.  Her daughter and son are here with her.  After consideration of risks, benefits and other options for treatment, the patient has consented to  Procedure(s): RIGHT BREAST LUMPECTOMY WITH RADIOACTIVE SEED AND RIGHT SENTINEL LYMPH NODE BIOPSY (Right) INSERTION PORT-A-CATH WITH ULTRA SOUND GUIDANCE (N/A) as a surgical intervention .  The patient's history has been reviewed, patient examined, no change in status, stable for surgery.  I have reviewed the patient's chart and labs.  Questions were answered to the patient's satisfaction.     Jenene Kauffmann H

## 2015-02-03 NOTE — Op Note (Signed)
02/03/2015  10:06 AM  PATIENT:  Shawna [name] DOB: 02-Sep-1940 MRN: [id number]  PREOP DIAGNOSIS:  right breast cancer  POSTOP DIAGNOSIS:   Right breast cancer, 3 o'clock position (T1, N0)  PROCEDURE:   Procedure(s): RIGHT BREAST LUMPECTOMY WITH RADIOACTIVE SEED AND RIGHT SENTINEL LYMPH NODE BIOPSY,  INSERTION PORT-A-CATH   SURGEON:   [name], M.D.  ANESTHESIA:   general  Anesthesiologist: Suzette Battiest, MD CRNA: April W Carter, CRNA; Willa Frater, CRNA  General  EBL:  <100  ml  DRAINS: none   LOCAL MEDICATIONS USED:   20 cc of 1/4% marcaine with epi  SPECIMEN:   Right breast lumpectomy, superior margin, right axillary sentinel lymph node (counts 600, background 15)  COUNTS CORRECT:  YES  INDICATIONS FOR PROCEDURE:  Shawna [name] is a 75 y.o. (DOB: 10-15-1966) white  female whose primary care physician is Precious Reel, MD and comes for right breast lumpectomy and right axillary sentinel lymph node biopsy and power port placement.   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  She has been seen by Drs. Philis Nettle.  There are plans for chemo tx and radation tx post op.    The options for breast cancer treatment have been discussed with the patient. She elected to proceed with lumpectomy and axillary sentinel lymph node.     The indications and potential complications of surgery were explained to the patient. Potential complications include, but are not limited to, bleeding, infection, the need for further surgery, and nerve injury.     She had a I131 seed placed on 02/02/2015 in her right breast at [facility].  I confirmed the presence of the I131 seed in the pre op area using the Neoprobe.  The seed is in the 3 o'clock position of the right breast.   In the holding area, her right areola was injected with 1 millicurie of Technitium Sulfur Colloid.  OPERATIVE NOTE:   The patient was taken to room # 5 at [facility] where she underwent a general anesthesia   supervised by Anesthesiologist: Suzette Battiest, MD CRNA: April W Carter, CRNA; Willa Frater, CRNA. Her right breast and axilla were prepped with  ChloraPrep and sterilely draped.    A time-out and the surgical check list was reviewed.    I did not inject methylene blue.  I turned attention to the cancer which was about at the 3 o'clock position of the right breast.   I used the Neoprobe to identify the I131 seed.  I tried to excise an area around the tumor of at least 1 cm.    I excised this block of breast tissue approximately 3 cm by 4 cm  in diameter.   I placed a suture in the medial aspect of the biopsy.  I painted the lumpectomy specimen with the 6 color paint kit and did a specimen mammogram which confirmed the mass, clip, and the seed were all in the right position in the specimen.  The specimen was sent to pathology who called back to confirm that they have the seed and the specimen.   Dr. Isaiah Blakes was concerned that the superior margin was close.  So I took additional superior margin of the biopsy cavity.  I marked this with a suture anteriorly and painted the additional specimen.   I then started the right axillary sentinel lymph node biopsy. I made an incision in the right axilla.  I found a hot area at the junction of the  breast and the pectoralis major muscle. I cut down and  identified a hot node that had counts of 600 and the background has 15 counts.  I checked her internal mammary nodes and supraclavicular nodes with the neoprobe and found no other hot area. The axillary node was then sent to pathology.    I then irrigated the wound with saline. I infiltrated approximately 30 mL of 1% local between the incisions.  I placed 4 clips to mark biopsy cavity, at 12, 3, 6, and 9 o'clock.  I then closed all the wounds in layers using 3-0 Vicryl sutures for the deep layer. At the skin, I closed the incisions with a 4-0 Monocryl suture.    I then redraped the patient for the power  port.   A time out was held and the surgery checklist reviewed.   The patient was placed in Trendelenburg position.  The left subclavian vein was accessed with a 16 gauge needle and a guide wire threaded through the needle into the vein.  The position of the wire was checked with fluoroscopy.   I then developed a pocket in the upper inner aspect of the left chest for the port reservoir.  I used the [employer] for venous access.  The reservoir was sewn in place with a 3-0 Vicryl suture.  The reservoir had been flushed with dilute (10 units/cc) heparin.   I then passed the silastic tubing from the reservoir incision to the subclavian stick site and used the 8 French introducer to pass it into the vein.  The tip of the silastic catheter was position at the junction of the SVC and the right atrium under fluoroscopy.  The silastic catheter was then attached to the port with the bayonet device.     The entire port and tubing were checked with fluoroscopy and then the port was flushed with 4 cc of concentrated heparin (100 units/cc).   The wounds were then closed with 3-0 vicryl subcutaneous sutures and the skin closed with a 4-0 Monocryl suture.  The skin was painted with Liquidband.   The incisions were then painted with LiquiBand.  She had gauze place over the wounds and placed in a breast binder.   The patient tolerated the procedure well, was transported to the recovery room in good condition. Sponge and needle count were correct at the end of the case.   Final pathology is pending.  CXR is pending.   [name], MD, [facility] Pager: [phone] Office phone:  [phone]

## 2015-02-03 NOTE — Anesthesia Preprocedure Evaluation (Addendum)
Anesthesia Evaluation  Patient identified by MRN, date of birth, ID band Patient awake    Reviewed: Allergy & Precautions, NPO status , Patient's Chart, lab work & pertinent test results  Airway Mallampati: II  TM Distance: >3 FB Neck ROM: Full    Dental  (+) Dental Advisory Given   Pulmonary neg pulmonary ROS,    breath sounds clear to auscultation       Cardiovascular hypertension, Pt. on medications  Rhythm:Regular Rate:Normal     Neuro/Psych negative neurological ROS     GI/Hepatic negative GI ROS, Neg liver ROS,   Endo/Other  negative endocrine ROS  Renal/GU negative Renal ROS     Musculoskeletal   Abdominal   Peds  Hematology negative hematology ROS (+)   Anesthesia Other Findings   Reproductive/Obstetrics                             Lab Results  Component Value Date   CREATININE 1.12* 01/29/2015   BUN 29* 01/29/2015   NA 142 01/29/2015   K 4.5 01/29/2015   CL 108 01/29/2015   CO2 26 01/29/2015    Anesthesia Physical Anesthesia Plan  ASA: II  Anesthesia Plan: General and Regional   Post-op Pain Management: GA combined w/ Regional for post-op pain   Induction: Intravenous  Airway Management Planned: LMA  Additional Equipment:   Intra-op Plan:   Post-operative Plan: Extubation in OR  Informed Consent: I have reviewed the patients History and Physical, chart, labs and discussed the procedure including the risks, benefits and alternatives for the proposed anesthesia with the patient or authorized representative who has indicated his/her understanding and acceptance.   Dental advisory given  Plan Discussed with: CRNA  Anesthesia Plan Comments:         Anesthesia Quick Evaluation

## 2015-02-03 NOTE — Progress Notes (Signed)
Assisted Dr. Rob Fitzgerald with right, ultrasound guided, pectoralis block. Side rails up, monitors on throughout procedure. See vital signs in flow sheet. Tolerated Procedure well. 

## 2015-02-03 NOTE — Discharge Instructions (Signed)
[facility] - DISCHARGE INSTRUCTIONS TO PATIENT  Activity:  Driving - May drive in 1 or 2 days, if doing well   Lifting - No lifting more than 15 pounds for one week  Wound Care:   Leave bandage for 2 days, then may shower  Diet:  As tolerated  Follow up appointment:  Call Dr. Pollie Friar office [facility]) at [phone] for an appointment in 2 weeks  Medications and dosages:  Resume your home medications.  You have a prescription for:  Vicodin  Call Dr. Lucia Gaskins or his office  [phone]) if you have:  Temperature greater than 100.4,  Persistent nausea and vomiting,  Severe uncontrolled pain,  Redness, tenderness, or signs of infection (pain, swelling, redness, odor or green/yellow discharge around the site),  Difficulty breathing, headache or visual disturbances,  Any other questions or concerns you may have after discharge.  In an emergency, call 911 or go to an Emergency Department at a nearby hospital.    Post Anesthesia Home Care Instructions  Activity: Get plenty of rest for the remainder of the day. A responsible adult should stay with you for 24 hours following the procedure.  For the next 24 hours, DO NOT: -Drive a car -Paediatric nurse -Drink alcoholic beverages -Take any medication unless instructed by your physician -Make any legal decisions or sign important papers.  Meals: Start with liquid foods such as gelatin or soup. Progress to regular foods as tolerated. Avoid greasy, spicy, heavy foods. If nausea and/or vomiting occur, drink only clear liquids until the nausea and/or vomiting subsides. Call your physician if vomiting continues.  Special Instructions/Symptoms: Your throat may feel dry or sore from the anesthesia or the breathing tube placed in your throat during surgery. If this causes discomfort, gargle with warm salt water. The discomfort should disappear within 24 hours.  If you had a scopolamine patch placed behind your  ear for the management of post- operative nausea and/or vomiting:  1. The medication in the patch is effective for 72 hours, after which it should be removed.  Wrap patch in a tissue and discard in the trash. Wash hands thoroughly with soap and water. 2. You may remove the patch earlier than 72 hours if you experience unpleasant side effects which may include dry mouth, dizziness or visual disturbances. 3. Avoid touching the patch. Wash your hands with soap and water after contact with the patch.

## 2015-02-04 ENCOUNTER — Encounter (HOSPITAL_BASED_OUTPATIENT_CLINIC_OR_DEPARTMENT_OTHER): Payer: Self-pay | Admitting: Surgery

## 2015-02-06 ENCOUNTER — Telehealth: Payer: Self-pay | Admitting: Hematology and Oncology

## 2015-02-06 ENCOUNTER — Telehealth: Payer: Self-pay | Admitting: Nurse Practitioner

## 2015-02-06 NOTE — Telephone Encounter (Signed)
Patient called in to reschedule her chemo ed class

## 2015-02-06 NOTE — Telephone Encounter (Signed)
Left pt a message saying that we were thinking of her and quick recovery from her breast cancer surgery on 02/03/15.  No need to call back.

## 2015-02-09 ENCOUNTER — Other Ambulatory Visit: Payer: Medicare Other

## 2015-02-10 ENCOUNTER — Encounter: Payer: Self-pay | Admitting: Hematology and Oncology

## 2015-02-10 ENCOUNTER — Telehealth: Payer: Self-pay | Admitting: Hematology and Oncology

## 2015-02-10 ENCOUNTER — Ambulatory Visit (HOSPITAL_BASED_OUTPATIENT_CLINIC_OR_DEPARTMENT_OTHER): Payer: Medicare Other | Admitting: Hematology and Oncology

## 2015-02-10 VITALS — BP 116/66 | HR 75 | Temp 97.7°F | Resp 18 | Ht 69.0 in | Wt 137.1 lb

## 2015-02-10 DIAGNOSIS — Z1589 Genetic susceptibility to other disease: Secondary | ICD-10-CM

## 2015-02-10 DIAGNOSIS — Z1509 Genetic susceptibility to other malignant neoplasm: Secondary | ICD-10-CM

## 2015-02-10 DIAGNOSIS — Z1501 Genetic susceptibility to malignant neoplasm of breast: Secondary | ICD-10-CM

## 2015-02-10 DIAGNOSIS — Z171 Estrogen receptor negative status [ER-]: Secondary | ICD-10-CM | POA: Diagnosis not present

## 2015-02-10 DIAGNOSIS — C50811 Malignant neoplasm of overlapping sites of right female breast: Secondary | ICD-10-CM | POA: Diagnosis not present

## 2015-02-10 DIAGNOSIS — C50311 Malignant neoplasm of lower-inner quadrant of right female breast: Secondary | ICD-10-CM

## 2015-02-10 MED ORDER — LIDOCAINE-PRILOCAINE 2.5-2.5 % EX CREA: TOPICAL_CREAM | CUTANEOUS | Status: DC

## 2015-02-10 MED ORDER — PROCHLORPERAZINE MALEATE 10 MG PO TABS: 10.0000 mg | ORAL_TABLET | Freq: Four times a day (QID) | ORAL | Status: DC | PRN

## 2015-02-10 MED ORDER — LORAZEPAM 0.5 MG PO TABS: 0.5000 mg | ORAL_TABLET | Freq: Every day | ORAL | Status: DC

## 2015-02-10 MED ORDER — DEXAMETHASONE 4 MG PO TABS: 4.0000 mg | ORAL_TABLET | Freq: Every day | ORAL | Status: DC

## 2015-02-10 MED ORDER — ONDANSETRON HCL 8 MG PO TABS: 8.0000 mg | ORAL_TABLET | Freq: Two times a day (BID) | ORAL | Status: DC | PRN

## 2015-02-10 NOTE — Telephone Encounter (Signed)
Appointments made and avs printed °

## 2015-02-10 NOTE — Progress Notes (Signed)
Ativan Rx faxed to pharmacy.  Sent to scan.

## 2015-02-10 NOTE — Addendum Note (Signed)
Addended by: Prentiss Bells on: 02/10/2015 01:56 PM   Modules accepted: Orders

## 2015-02-10 NOTE — Assessment & Plan Note (Signed)
Right lumpectomy 02/03/2015: Invasive ductal carcinoma grade 2, 2.1 cm, with associated DCIS, margins are negative, 0/1 lymph node, ER 0%, PR 0%, HER-2 positive ratio 2.31, T2 N0 stage II a. (Right breast biopsy 3:30 position 12/09/2014: Invasive ductal carcinoma grade 3, ER 0%, PR 0%, HER-2 negative ratio 1.5, Ki-67 90%, 1 cm irregular mass T1C N0 stage IA clinical stage) Genetic counseling revealed NBN mutation.  Pathology counseling: I discussed the final pathology report of the patient provided  a copy of this report. I discussed the margins as well as lymph node surgeries. We also discussed the final staging along with ER/PR and HER-2/neu testing. HER-2 came back positive on the final path.  Recommendation: 1. Adjuvant chemotherapy with TCH 6 cycles followed by Herceptin maintenance for 1 year 2. Followed by radiation therapy  Chemotherapy counseling:I have discussed the risks and benefits of chemotherapy including the risks of nausea/ vomiting, risk of infection from low WBC count, fatigue due to chemo or anemia, bruising or bleeding due to low platelets, mouth sores, loss/ change in taste and decreased appetite. Liver and kidney function will be monitored through out chemotherapy as abnormalities in liver and kidney function may be a side effect of treatment. Cardiac dysfunction due to Herceptin was discussed in detail. Risk of permanent bone marrow dysfunction and leukemia due to chemo were also discussed.  NBN Mutation: Women with mutations in the NBN gene have an increased risk for breast cancer (some studies point to 3 times increased risk for breast cancer), and men with mutations in NBN have an increased risk for prostate cancer. Research associated with NBN mutations is advancing; however, the exact cancer risks are unknown. NCCN recommends annual mammogram and to consider breast MRI. There is insufficient evidence for prophylactic mastectomy. There is unknown or insufficient evidence for  ovarian cancer risk or any other cancer risks. For this reason I did not recommend prophylactic oophorectomy.  Return to clinic the start of chemotherapy in about 3 weeks

## 2015-02-10 NOTE — Progress Notes (Signed)
Patient Care Team: [name], MD as PCP - General (Internal Medicine) [name], MD as Consulting Physician (General Surgery) [name], MD as Consulting Physician (Hematology and Oncology)  DIAGNOSIS: No matching staging information was found for the patient.  SUMMARY OF ONCOLOGIC HISTORY:   Breast cancer of lower-inner quadrant of right female breast ([place])   12/02/2014 Mammogram Right breast irregular mass 1 cm size with calcifications   12/09/2014 Initial Diagnosis Right breast biopsy 3:30 position: Invasive ductal carcinoma grade 3, ER 0%, PR 0%, HER-2 negative ratio 1.5, Ki-67 90%, T1C N0 stage IA clinical stage   12/16/2014 Procedure genetic testing showed NBN pathologic mutation   02/03/2015 Surgery Right lumpectomy: Invasive ductal carcinoma grade 2, 2.1 cm, with associated DCIS, margins are negative, 0/1 lymph node, ER 0%, PR 0%, HER-2 positive ratio 2.31, T2 N0 stage II a    CHIEF COMPLIANT: follow-up after recent surgery  INTERVAL HISTORY: Shawna [name] is a 75 year old with above-mentioned history of right breast cancer treated with lumpectomy who is here to discuss the final pathology report. On the final path it appears that the tumor was HER-2 positive and it was quite larger than originally estimated. Her final stage is stage IIA. She is recovering very well from the surgery. Her port is healing quite well.  REVIEW OF SYSTEMS:   Constitutional: Denies fevers, chills or abnormal weight loss Eyes: Denies blurriness of vision Ears, nose, mouth, throat, and face: Denies mucositis or sore throat Respiratory: Denies cough, dyspnea or wheezes Cardiovascular: Denies palpitation, chest discomfort Gastrointestinal:  Denies nausea, heartburn or change in bowel habits Skin: Denies abnormal skin rashes Lymphatics: Denies new lymphadenopathy or easy bruising Neurological:Denies numbness, tingling or new weaknesses Behavioral/Psych: Mood is stable, no new changes    Extremities: No lower extremity edema Breast: mild soreness from recent surgery All other systems were reviewed with the patient and are negative.  I have reviewed the past medical history, past surgical history, social history and family history with the patient and they are unchanged from previous note.  ALLERGIES:  has No Known Allergies.  MEDICATIONS:  Current Outpatient Prescriptions  Medication Sig Dispense Refill  . ALPRAZolam (XANAX) 0.25 MG tablet TAKE 1-2 TABLETS BY MOUTH 2 TIMES DAILY AS NEEDED  0  . Calcium Carbonate-Vitamin D (CALCIUM 600 + D PO) Take 1 tablet by mouth daily.     . Cholecalciferol (VITAMIN D) 2000 UNITS tablet Take 2,000 Units by mouth daily.      . CRESTOR 20 MG tablet Take 1 tablet by mouth daily.  0  . HYDROcodone-acetaminophen (NORCO/VICODIN) 5-325 MG tablet Take 1-2 tablets by mouth every 6 (six) hours as needed. 30 tablet 0  . solifenacin (VESICARE) 5 MG tablet Take 10 mg by mouth daily.    Marland Kitchen telmisartan-hydrochlorothiazide (MICARDIS HCT) 80-12.5 MG per tablet Take 1 tablet by mouth daily.      . zoledronic acid (RECLAST) 5 MG/100ML SOLN Inject 5 mg into the vein once. annually      No current facility-administered medications for this visit.    PHYSICAL EXAMINATION: ECOG PERFORMANCE STATUS: 1 - Symptomatic but completely ambulatory  Filed Vitals:   02/10/15 0906  BP: 116/66  Pulse: 75  Temp: 97.7 F (36.5 C)  Resp: 18   Filed Weights   02/10/15 0906  Weight: 137 lb 1.6 oz (62.188 kg)    GENERAL:alert, no distress and comfortable SKIN: skin color, texture, turgor are normal, no rashes or significant lesions EYES: normal, Conjunctiva are pink and  non-injected, sclera clear OROPHARYNX:no exudate, no erythema and lips, buccal mucosa, and tongue normal  NECK: supple, thyroid normal size, non-tender, without nodularity LYMPH:  no palpable lymphadenopathy in the cervical, axillary or inguinal LUNGS: clear to auscultation and percussion with  normal breathing effort HEART: regular rate & rhythm and no murmurs and no lower extremity edema ABDOMEN:abdomen soft, non-tender and normal bowel sounds MUSCULOSKELETAL:no cyanosis of digits and no clubbing  NEURO: alert & oriented x 3 with fluent speech, no focal motor/sensory deficits EXTREMITIES: No lower extremity edema BREAST:incisions healing quite well. (exam performed in the presence of a chaperone)  LABORATORY DATA:  I have reviewed the data as listed   Chemistry      Component Value Date/Time   NA 142 01/29/2015 1545   K 4.5 01/29/2015 1545   CL 108 01/29/2015 1545   CO2 26 01/29/2015 1545   BUN 29* 01/29/2015 1545   CREATININE 1.12* 01/29/2015 1545      Component Value Date/Time   CALCIUM 9.6 01/29/2015 1545       Lab Results  Component Value Date   WBC 9.4 07/13/2006   HGB 14.9 07/13/2006   HCT 42.7 07/13/2006   MCV 94.1 07/13/2006   PLT 211 07/13/2006     ASSESSMENT & PLAN:  Breast cancer of lower-inner quadrant of right female breast ([place]) Right lumpectomy 02/03/2015: Invasive ductal carcinoma grade 2, 2.1 cm, with associated DCIS, margins are negative, 0/1 lymph node, ER 0%, PR 0%, HER-2 positive ratio 2.31, T2 N0 stage II a. (Right breast biopsy 3:30 position 12/09/2014: Invasive ductal carcinoma grade 3, ER 0%, PR 0%, HER-2 negative ratio 1.5, Ki-67 90%, 1 cm irregular mass T1C N0 stage IA clinical stage) Genetic counseling revealed NBN mutation.  Pathology counseling: I discussed the final pathology report of the patient provided  a copy of this report. I discussed the margins as well as lymph node surgeries. We also discussed the final staging along with ER/PR and HER-2/neu testing. HER-2 came back positive on the final path.  Recommendation: 1. Adjuvant chemotherapy with TCH 6 cycles followed by Herceptin maintenance for 1 year 2. Followed by radiation therapy  Chemotherapy counseling:I have discussed the risks and benefits of chemotherapy  including the risks of nausea/ vomiting, risk of infection from low WBC count, fatigue due to chemo or anemia, bruising or bleeding due to low platelets, mouth sores, loss/ change in taste and decreased appetite. Liver and kidney function will be monitored through out chemotherapy as abnormalities in liver and kidney function may be a side effect of treatment. Cardiac dysfunction due to Herceptin was discussed in detail. Risk of permanent bone marrow dysfunction and leukemia due to chemo were also discussed.  NBN Mutation: Women with mutations in the NBN gene have an increased risk for breast cancer (some studies point to 3 times increased risk for breast cancer), and men with mutations in NBN have an increased risk for prostate cancer. Research associated with NBN mutations is advancing; however, the exact cancer risks are unknown. NCCN recommends annual mammogram and to consider breast MRI. There is insufficient evidence for prophylactic mastectomy. There is unknown or insufficient evidence for ovarian cancer risk or any other cancer risks. For this reason I did not recommend prophylactic oophorectomy.  Return to clinic the start of chemotherapy in about 3 weeks   No orders of the defined types were placed in this encounter.   The patient has a good understanding of the overall plan. she agrees with it. she will  call with any problems that may develop before the next visit here.   Rulon Eisenmenger, MD 02/10/2015

## 2015-02-10 NOTE — Progress Notes (Signed)
Pt reports no problems with surgery.  Ache 1/10 in her breast.

## 2015-02-12 ENCOUNTER — Encounter: Payer: Self-pay | Admitting: Hematology and Oncology

## 2015-02-12 NOTE — Progress Notes (Signed)
I recd copy of phy forms for patient dated 02/11/15. I faxed nothing.

## 2015-02-13 ENCOUNTER — Encounter: Payer: Self-pay | Admitting: *Deleted

## 2015-02-19 ENCOUNTER — Encounter: Payer: Self-pay | Admitting: Hematology and Oncology

## 2015-02-19 NOTE — Progress Notes (Signed)
Prior auth req sent to [employer]

## 2015-02-20 ENCOUNTER — Telehealth: Payer: Self-pay

## 2015-02-20 ENCOUNTER — Encounter: Payer: Self-pay | Admitting: Hematology and Oncology

## 2015-02-20 NOTE — Telephone Encounter (Signed)
Msg left on RN VM - Prior authoriation for zofran 8 mg good 02/19/15 - 02/18/16.  Routed to managed care.

## 2015-02-20 NOTE — Progress Notes (Signed)
Spoke with pamela in ref to prior auth for ondansetron.

## 2015-02-24 ENCOUNTER — Encounter: Payer: Self-pay | Admitting: Nurse Practitioner

## 2015-02-24 ENCOUNTER — Ambulatory Visit (INDEPENDENT_AMBULATORY_CARE_PROVIDER_SITE_OTHER): Payer: Medicare Other | Admitting: Nurse Practitioner

## 2015-02-24 VITALS — BP 126/74 | HR 72 | Ht 68.25 in | Wt 138.0 lb

## 2015-02-24 DIAGNOSIS — Z1509 Genetic susceptibility to other malignant neoplasm: Secondary | ICD-10-CM | POA: Diagnosis not present

## 2015-02-24 DIAGNOSIS — Z01419 Encounter for gynecological examination (general) (routine) without abnormal findings: Secondary | ICD-10-CM

## 2015-02-24 DIAGNOSIS — Z1501 Genetic susceptibility to malignant neoplasm of breast: Secondary | ICD-10-CM | POA: Diagnosis not present

## 2015-02-24 DIAGNOSIS — N3281 Overactive bladder: Secondary | ICD-10-CM

## 2015-02-24 DIAGNOSIS — Z1502 Genetic susceptibility to malignant neoplasm of ovary: Secondary | ICD-10-CM

## 2015-02-24 DIAGNOSIS — M810 Age-related osteoporosis without current pathological fracture: Secondary | ICD-10-CM

## 2015-02-24 DIAGNOSIS — Z1589 Genetic susceptibility to other disease: Secondary | ICD-10-CM | POA: Diagnosis not present

## 2015-02-24 DIAGNOSIS — C50311 Malignant neoplasm of lower-inner quadrant of right female breast: Secondary | ICD-10-CM | POA: Diagnosis not present

## 2015-02-24 DIAGNOSIS — Z0000 Encounter for general adult medical examination without abnormal findings: Secondary | ICD-10-CM

## 2015-02-24 NOTE — Patient Instructions (Signed)

## 2015-02-24 NOTE — Progress Notes (Addendum)
Patient ID: Shawna [name], female   DOB: Nov 08, 1940, 75 y.o.   MRN: [id number]  75 y.o. G2P2002 Widowed  Caucasian Fe here for annual exam.  She has been diagnosed with right breast cancer and underwent a lumpectomy on 02/03/15.  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  She then underwent genetic counseling and she was positive for NBN mutation.  BRCA was negative per pt.   She is planning for chemotherapy X 6 then will have radiation treatments.  She feels well. Port cath is in place.  Patient's last menstrual period was 01/17/1982 (approximate).          Sexually active: No.  The current method of family planning is post menopausal status.    Exercising: No.  will start with a walking program Smoker:  no  Health Maintenance: Pap: 02/13/12, Negative [id number] 12/09/14 with invasive ductal carcinoma Colonoscopy: 06/2010 Normal, repeat in 5 years BMD: 04/2012 Borderline Osteoporosis 03/04/15= Addendum report from [facility] BMD done 05/07/14: T score spine -1.0; left hip neck -1.9; right hip neck -3.0  Frax score 26% / 9.9%  See report TDaP: PCP Shingles: PCP Pneumonia: PCP including Prevnar 13 booster Hep C and HIV: Not indicated due to age Labs: PCP and [facility], Dr. Virgina Jock follows Vit D   reports that she has never smoked. She has never used smokeless tobacco. She reports that she drinks about 4.2 oz of alcohol per week. She reports that she does not use illicit drugs.  Past Medical History  Diagnosis Date  . Osteoporosis   . Hyperlipidemia   . Hypertension   . Kidney stone on left side   . Family history of breast cancer   . Family history of colon cancer   . Skin cancer of nose   . Monoallelic mutation of NBN gene     Past Surgical History  Procedure Laterality Date  . Lithotripsy      kidney stone on left side  . Ovarian cyst removal    . Colonoscopy  06/2010    rec  . Polypectomy  04/2005    colon  .  Appendectomy    . Breast surgery  11/2012    biopsy, benign breast tissue  . Breast lumpectomy with radioactive seed and sentinel lymph node biopsy Right 02/03/2015    Procedure: RIGHT BREAST LUMPECTOMY WITH RADIOACTIVE SEED AND RIGHT SENTINEL LYMPH NODE BIOPSY;  Surgeon: [name], MD;  Location: [facility];  Service: General;  Laterality: Right;  . Portacath placement Left 02/03/2015    Procedure: INSERTION PORT-A-CATH WITH ULTRA SOUND GUIDANCE;  Surgeon: [name], MD;  Location: [facility];  Service: General;  Laterality: Left;    Current Outpatient Prescriptions  Medication Sig Dispense Refill  . ALPRAZolam (XANAX) 0.25 MG tablet TAKE 1-2 TABLETS BY MOUTH 2 TIMES DAILY AS NEEDED  0  . Calcium Carbonate-Vitamin D (CALCIUM 600 + D PO) Take 1 tablet by mouth daily.     . Cholecalciferol (VITAMIN D) 2000 UNITS tablet Take 2,000 Units by mouth daily.      . CRESTOR 20 MG tablet Take 1 tablet by mouth daily.  0  . dexamethasone (DECADRON) 4 MG tablet Take 1 tablet (4 mg total) by mouth daily. Start the day before Taxotere. Then again the day after chemo for 2 days. 30 tablet 1  . HYDROcodone-acetaminophen (NORCO/VICODIN) 5-325 MG tablet Take 1-2 tablets by mouth every 6 (six) hours as  needed. 30 tablet 0  . lidocaine-prilocaine (EMLA) cream Apply to affected area once 30 g 3  . LORazepam (ATIVAN) 0.5 MG tablet Take 1 tablet (0.5 mg total) by mouth at bedtime. 30 tablet 0  . ondansetron (ZOFRAN) 8 MG tablet Take 1 tablet (8 mg total) by mouth 2 (two) times daily as needed for refractory nausea / vomiting. Start on day 3 after chemo. 30 tablet 1  . prochlorperazine (COMPAZINE) 10 MG tablet Take 1 tablet (10 mg total) by mouth every 6 (six) hours as needed (Nausea or vomiting). 30 tablet 1  . solifenacin (VESICARE) 5 MG tablet Take 10 mg by mouth daily.    Marland Kitchen telmisartan-hydrochlorothiazide (MICARDIS HCT) 80-12.5 MG per tablet Take 1 tablet by mouth daily.      .  zoledronic acid (RECLAST) 5 MG/100ML SOLN Inject 5 mg into the vein once. annually      No current facility-administered medications for this visit.    Family History  Problem Relation Age of Onset  . Colon cancer Sister 20  . Breast cancer Mother 51  . Hypertension Mother   . Osteoporosis Mother   . Heart disease Father     CABG  . Dementia Father   . Throat cancer Brother 70  . Breast cancer Sister 32  . Lymphoma Sister 3  . Melanoma Sister   . Skin cancer Brother   . Skin cancer Daughter     ROS:  Pertinent items are noted in HPI.  Otherwise, a comprehensive ROS was negative.  Exam:   BP 126/74 mmHg  Pulse 72  Ht 5' 8.25" (1.734 m)  Wt 138 lb (62.596 kg)  BMI 20.82 kg/m2  LMP 01/17/1982 (Approximate) Height: 5' 8.25" (173.4 cm) Ht Readings from Last 3 Encounters:  02/24/15 5' 8.25" (1.734 m)  02/10/15 _0  (1.753 m)  02/03/15 _1  (1.753 m)    General appearance: alert, cooperative and appears stated age Head: Normocephalic, without obvious abnormality, atraumatic Neck: no adenopathy, supple, symmetrical, trachea midline and thyroid normal to inspection and palpation Lungs: clear to auscultation bilaterally Breasts: normal appearance, no masses or tenderness, of the left breast with port cath above the breast.  On the right she had surgical changes but there were areas around the incision that were red.   Along the superior margins at 11-12 o'clock position with focal redness and warmer to touch.  Along the inferior margins areas were light pink but still felt warmer.  There was no exudate or discharge.   I had asked DL to also exam and she also felt area to be warm.  There was no discomfort or tenderness at the right axilla. Heart: regular rate and rhythm Abdomen: soft, non-tender; no masses,  no organomegaly Extremities: extremities normal, atraumatic, no cyanosis or edema Skin: Skin color, texture, turgor normal. No rashes or lesions Lymph nodes: Cervical,  supraclavicular, and axillary nodes normal. No abnormal inguinal nodes palpated Neurologic: Grossly normal   Pelvic: External genitalia:  no lesions              Urethra:  normal appearing urethra with no masses, tenderness or lesions              Bartholin's and Skene's: normal                 Vagina: normal appearing vagina with normal color and discharge, no lesions              Cervix: anteverted  Pap taken: Yes.   Bimanual Exam:  Uterus:  normal size, contour, position, consistency, mobility, non-tender              Adnexa: no mass, fullness, tenderness               Rectovaginal: Confirms               Anus:  normal sphincter tone, no lesions  Chaperone present: yes  A:  Well Woman with normal exam  Postmenopausal - off Prempro 2003 Borderline osteoporosis - prior use of Evista and Fosamax now on Reclast every other year per PCP last dose given 07/26/13 OAB - doing well on Vesicare - given by PCP  Right breast cancer with lumpectomy 02/03/15 - ? Low grade infection    P:   Reviewed health and wellness pertinent to exam  Pap smear as above  Mammogram is due per recommendations from [facility]  Have instructed pt to call the surgeon and have a recheck of right breast  Counseled on breast self exam, mammography screening, adequate intake of calcium and vitamin D, diet and exercise, Kegel's exercises return annually or prn  An After Visit Summary was printed and given to the patient.

## 2015-02-25 ENCOUNTER — Encounter: Payer: Self-pay | Admitting: *Deleted

## 2015-02-25 NOTE — Progress Notes (Signed)
Encounter reviewed by Dr. Brook Amundson C. Silva.  

## 2015-02-26 LAB — IPS PAP SMEAR ONLY

## 2015-03-04 ENCOUNTER — Encounter: Payer: Self-pay | Admitting: Hematology and Oncology

## 2015-03-04 NOTE — Progress Notes (Signed)
Resent to [facility] req for ondansetron

## 2015-03-08 NOTE — Assessment & Plan Note (Signed)
Right lumpectomy 02/03/2015: Invasive ductal carcinoma grade 2, 2.1 cm, with associated DCIS, margins are negative, 0/1 lymph node, ER 0%, PR 0%, HER-2 positive ratio 2.31, T2 N0 stage II a. 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 Adjuvant chemotherapy with TCH 6 cycles followed by Herceptin maintenance for 1 year 2. Followed by radiation therapy  Current Treatment: Cycle 1 day 1 TCH ECHO 01/30/15: EF 65-70% Anti emetics were reviewed  RTC in 1 week for tox check

## 2015-03-09 ENCOUNTER — Other Ambulatory Visit (HOSPITAL_BASED_OUTPATIENT_CLINIC_OR_DEPARTMENT_OTHER): Payer: Medicare Other

## 2015-03-09 ENCOUNTER — Encounter: Payer: Self-pay | Admitting: Hematology and Oncology

## 2015-03-09 ENCOUNTER — Ambulatory Visit (HOSPITAL_BASED_OUTPATIENT_CLINIC_OR_DEPARTMENT_OTHER): Payer: Medicare Other

## 2015-03-09 ENCOUNTER — Encounter: Payer: Self-pay | Admitting: *Deleted

## 2015-03-09 ENCOUNTER — Ambulatory Visit (HOSPITAL_BASED_OUTPATIENT_CLINIC_OR_DEPARTMENT_OTHER): Payer: Medicare Other | Admitting: Hematology and Oncology

## 2015-03-09 VITALS — BP 142/74 | HR 65 | Temp 98.0°F | Resp 18 | Ht 68.25 in | Wt 139.3 lb

## 2015-03-09 VITALS — BP 164/67 | HR 68 | Temp 98.2°F | Resp 18

## 2015-03-09 DIAGNOSIS — C50311 Malignant neoplasm of lower-inner quadrant of right female breast: Secondary | ICD-10-CM | POA: Diagnosis not present

## 2015-03-09 DIAGNOSIS — Z5112 Encounter for antineoplastic immunotherapy: Secondary | ICD-10-CM

## 2015-03-09 DIAGNOSIS — Z5111 Encounter for antineoplastic chemotherapy: Secondary | ICD-10-CM

## 2015-03-09 DIAGNOSIS — Z171 Estrogen receptor negative status [ER-]: Secondary | ICD-10-CM | POA: Diagnosis not present

## 2015-03-09 DIAGNOSIS — Z5189 Encounter for other specified aftercare: Secondary | ICD-10-CM | POA: Diagnosis not present

## 2015-03-09 LAB — COMPREHENSIVE METABOLIC PANEL
ALBUMIN: 4 g/dL (ref 3.5–5.0)
ALK PHOS: 104 U/L (ref 40–150)
ALT: 41 U/L (ref 0–55)
AST: 29 U/L (ref 5–34)
Anion Gap: 10 mEq/L (ref 3–11)
BUN: 21.1 mg/dL (ref 7.0–26.0)
CALCIUM: 9.9 mg/dL (ref 8.4–10.4)
CO2: 25 mEq/L (ref 22–29)
CREATININE: 0.9 mg/dL (ref 0.6–1.1)
Chloride: 107 mEq/L (ref 98–109)
EGFR: 62 mL/min/{1.73_m2} — ABNORMAL LOW (ref >90)
GLUCOSE: 69 mg/dL — AB (ref 70–140)
POTASSIUM: 3.6 meq/L (ref 3.5–5.1)
SODIUM: 142 meq/L (ref 136–145)
Total Bilirubin: 0.54 mg/dL (ref 0.20–1.20)
Total Protein: 7.1 g/dL (ref 6.4–8.3)

## 2015-03-09 LAB — CBC WITH DIFFERENTIAL/PLATELET
BASO%: 0.8 % (ref 0.0–2.0)
Basophils Absolute: 0.1 10*3/uL (ref 0.0–0.1)
EOS%: 0.5 % (ref 0.0–7.0)
Eosinophils Absolute: 0 10*3/uL (ref 0.0–0.5)
HEMATOCRIT: 42.2 % (ref 34.8–46.6)
HEMOGLOBIN: 13.9 g/dL (ref 11.6–15.9)
LYMPH#: 1.2 10*3/uL (ref 0.9–3.3)
LYMPH%: 16.2 % (ref 14.0–49.7)
MCH: 31.3 pg (ref 25.1–34.0)
MCHC: 33 g/dL (ref 31.5–36.0)
MCV: 94.8 fL (ref 79.5–101.0)
MONO#: 0.5 10*3/uL (ref 0.1–0.9)
MONO%: 6.3 % (ref 0.0–14.0)
NEUT%: 76.2 % (ref 38.4–76.8)
NEUTROS ABS: 5.6 10*3/uL (ref 1.5–6.5)
Platelets: 196 10*3/uL (ref 145–400)
RBC: 4.46 10*6/uL (ref 3.70–5.45)
RDW: 13.6 % (ref 11.2–14.5)
WBC: 7.4 10*3/uL (ref 3.9–10.3)

## 2015-03-09 MED ORDER — SODIUM CHLORIDE 0.9 % IV SOLN
Freq: Once | INTRAVENOUS | Status: AC
  Administered 2015-03-09: 11:00:00 via INTRAVENOUS

## 2015-03-09 MED ORDER — SODIUM CHLORIDE 0.9% FLUSH
10.0000 mL | INTRAVENOUS | Status: DC | PRN
  Filled 2015-03-09: qty 10

## 2015-03-09 MED ORDER — ACETAMINOPHEN 325 MG PO TABS
ORAL_TABLET | ORAL | Status: AC
  Filled 2015-03-09: qty 2

## 2015-03-09 MED ORDER — DIPHENHYDRAMINE HCL 25 MG PO CAPS
50.0000 mg | ORAL_CAPSULE | Freq: Once | ORAL | Status: AC
  Administered 2015-03-09: 50 mg via ORAL

## 2015-03-09 MED ORDER — TRASTUZUMAB CHEMO INJECTION 440 MG
8.0000 mg/kg | Freq: Once | INTRAVENOUS | Status: AC
  Administered 2015-03-09: 504 mg via INTRAVENOUS
  Filled 2015-03-09: qty 24

## 2015-03-09 MED ORDER — PALONOSETRON HCL INJECTION 0.25 MG/5ML
INTRAVENOUS | Status: AC
  Filled 2015-03-09: qty 5

## 2015-03-09 MED ORDER — DIPHENHYDRAMINE HCL 25 MG PO CAPS
ORAL_CAPSULE | ORAL | Status: AC
  Filled 2015-03-09: qty 2

## 2015-03-09 MED ORDER — SODIUM CHLORIDE 0.9 % IV SOLN
449.4000 mg | Freq: Once | INTRAVENOUS | Status: AC
  Administered 2015-03-09: 450 mg via INTRAVENOUS
  Filled 2015-03-09: qty 45

## 2015-03-09 MED ORDER — PEGFILGRASTIM 6 MG/0.6ML ~~LOC~~ PSKT
6.0000 mg | PREFILLED_SYRINGE | Freq: Once | SUBCUTANEOUS | Status: AC
  Administered 2015-03-09: 6 mg via SUBCUTANEOUS
  Filled 2015-03-09: qty 0.6

## 2015-03-09 MED ORDER — DOCETAXEL CHEMO INJECTION 160 MG/16ML
75.0000 mg/m2 | Freq: Once | INTRAVENOUS | Status: AC
  Administered 2015-03-09: 130 mg via INTRAVENOUS
  Filled 2015-03-09: qty 13

## 2015-03-09 MED ORDER — SODIUM CHLORIDE 0.9 % IV SOLN
10.0000 mg | Freq: Once | INTRAVENOUS | Status: AC
  Administered 2015-03-09: 10 mg via INTRAVENOUS
  Filled 2015-03-09: qty 1

## 2015-03-09 MED ORDER — PALONOSETRON HCL INJECTION 0.25 MG/5ML
0.2500 mg | Freq: Once | INTRAVENOUS | Status: AC
  Administered 2015-03-09: 0.25 mg via INTRAVENOUS

## 2015-03-09 MED ORDER — HEPARIN SOD (PORK) LOCK FLUSH 100 UNIT/ML IV SOLN
500.0000 [IU] | Freq: Once | INTRAVENOUS | Status: DC | PRN
  Filled 2015-03-09: qty 5

## 2015-03-09 MED ORDER — ACETAMINOPHEN 325 MG PO TABS
650.0000 mg | ORAL_TABLET | Freq: Once | ORAL | Status: AC
  Administered 2015-03-09: 650 mg via ORAL

## 2015-03-09 NOTE — Progress Notes (Signed)
Unable to get in to exam room prior to MD.  No assessment performed.  

## 2015-03-09 NOTE — Progress Notes (Signed)
Patient Care Team: [name], MD as PCP - General (Internal Medicine) [name], MD as Consulting Physician (General Surgery) [name], MD as Consulting Physician (Hematology and Oncology)  SUMMARY OF ONCOLOGIC HISTORY:   Breast cancer of lower-inner quadrant of right female breast ([place])   12/02/2014 Mammogram Right breast irregular mass 1 cm size with calcifications   12/09/2014 Initial Diagnosis Right breast biopsy 3:30 position: Invasive ductal carcinoma grade 3, ER 0%, PR 0%, HER-2 negative ratio 1.5, Ki-67 90%, T1C N0 stage IA clinical stage   12/16/2014 Procedure genetic testing showed NBN pathologic mutation   02/03/2015 Surgery Right lumpectomy: Invasive ductal carcinoma grade 2, 2.1 cm, with associated DCIS, margins are negative, 0/1 lymph node, ER 0%, PR 0%, HER-2 positive ratio 2.31, T2 N0 stage II a   03/09/2015 -  Chemotherapy Adjuvant chemotherapy with TCH 6 followed by Herceptin maintenance for 1 year   CHIEF COMPLIANT: cycle 1 day 1 TCH  INTERVAL HISTORY: Shawna [name] is a 75 year old with above-mentioned history of right wrist cancer treated with lumpectomy and is here today for first cycle of adjuvant chemotherapy with TCH. She reports no new problems or symptoms. She is anxious to get started the chemotherapy. She had hard time sleeping last night.  REVIEW OF SYSTEMS:   Constitutional: Denies fevers, chills or abnormal weight loss Eyes: Denies blurriness of vision Ears, nose, mouth, throat, and face: Denies mucositis or sore throat Respiratory: Denies cough, dyspnea or wheezes Cardiovascular: Denies palpitation, chest discomfort Gastrointestinal:  Denies nausea, heartburn or change in bowel habits Skin: Denies abnormal skin rashes Lymphatics: Denies new lymphadenopathy or easy bruising Neurological:Denies numbness, tingling or new weaknesses Behavioral/Psych: Mood is stable, no new changes  Extremities: No lower extremity edema All other systems were  reviewed with the patient and are negative.  I have reviewed the past medical history, past surgical history, social history and family history with the patient and they are unchanged from previous note.  ALLERGIES:  has No Known Allergies.  MEDICATIONS:  Current Outpatient Prescriptions  Medication Sig Dispense Refill  . ALPRAZolam (XANAX) 0.25 MG tablet TAKE 1-2 TABLETS BY MOUTH 2 TIMES DAILY AS NEEDED  0  . Calcium Carbonate-Vitamin D (CALCIUM 600 + D PO) Take 1 tablet by mouth daily.     . Cholecalciferol (VITAMIN D) 2000 UNITS tablet Take 2,000 Units by mouth daily.      . CRESTOR 20 MG tablet Take 1 tablet by mouth daily.  0  . dexamethasone (DECADRON) 4 MG tablet Take 1 tablet (4 mg total) by mouth daily. Start the day before Taxotere. Then again the day after chemo for 2 days. 30 tablet 1  . HYDROcodone-acetaminophen (NORCO/VICODIN) 5-325 MG tablet Take 1-2 tablets by mouth every 6 (six) hours as needed. 30 tablet 0  . lidocaine-prilocaine (EMLA) cream Apply to affected area once 30 g 3  . LORazepam (ATIVAN) 0.5 MG tablet Take 1 tablet (0.5 mg total) by mouth at bedtime. 30 tablet 0  . ondansetron (ZOFRAN) 8 MG tablet Take 1 tablet (8 mg total) by mouth 2 (two) times daily as needed for refractory nausea / vomiting. Start on day 3 after chemo. 30 tablet 1  . prochlorperazine (COMPAZINE) 10 MG tablet Take 1 tablet (10 mg total) by mouth every 6 (six) hours as needed (Nausea or vomiting). 30 tablet 1  . solifenacin (VESICARE) 5 MG tablet Take 10 mg by mouth daily.    Marland Kitchen telmisartan-hydrochlorothiazide (MICARDIS HCT) 80-12.5 MG per tablet Take 1 tablet  by mouth daily.      . zoledronic acid (RECLAST) 5 MG/100ML SOLN Inject 5 mg into the vein once. annually      No current facility-administered medications for this visit.    PHYSICAL EXAMINATION: ECOG PERFORMANCE STATUS: 0 - Asymptomatic  Filed Vitals:   03/09/15 0953  BP: 142/74  Pulse: 65  Temp: 98 F (36.7 C)  Resp: 18    Filed Weights   03/09/15 0953  Weight: 139 lb 4.8 oz (63.186 kg)    GENERAL:alert, no distress and comfortable SKIN: skin color, texture, turgor are normal, no rashes or significant lesions EYES: normal, Conjunctiva are pink and non-injected, sclera clear OROPHARYNX:no exudate, no erythema and lips, buccal mucosa, and tongue normal  NECK: supple, thyroid normal size, non-tender, without nodularity LYMPH:  no palpable lymphadenopathy in the cervical, axillary or inguinal LUNGS: clear to auscultation and percussion with normal breathing effort HEART: regular rate & rhythm and no murmurs and no lower extremity edema ABDOMEN:abdomen soft, non-tender and normal bowel sounds MUSCULOSKELETAL:no cyanosis of digits and no clubbing  NEURO: alert & oriented x 3 with fluent speech, no focal motor/sensory deficits EXTREMITIES: No lower extremity edema  LABORATORY DATA:  I have reviewed the data as listed   Chemistry      Component Value Date/Time   NA 142 01/29/2015 1545   K 4.5 01/29/2015 1545   CL 108 01/29/2015 1545   CO2 26 01/29/2015 1545   BUN 29* 01/29/2015 1545   CREATININE 1.12* 01/29/2015 1545      Component Value Date/Time   CALCIUM 9.6 01/29/2015 1545       Lab Results  Component Value Date   WBC 7.4 03/09/2015   HGB 13.9 03/09/2015   HCT 42.2 03/09/2015   MCV 94.8 03/09/2015   PLT 196 03/09/2015   NEUTROABS 5.6 03/09/2015   ASSESSMENT & PLAN:  Breast cancer of lower-inner quadrant of right female breast ([place]) Right lumpectomy 02/03/2015: Invasive ductal carcinoma grade 2, 2.1 cm, with associated DCIS, margins are negative, 0/1 lymph node, ER 0%, PR 0%, HER-2 positive ratio 2.31, T2 N0 stage II a. 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 Adjuvant chemotherapy  with TCH 6 cycles followed by Herceptin maintenance for 1 year 2. Followed by radiation therapy  Current Treatment: Cycle 1 day 1 TCH ECHO 01/30/15: EF 65-70% Anti emetics were reviewed All of their questions have been answered  RTC in 1 week for tox check  No orders of the defined types were placed in this encounter.   The patient has a good understanding of the overall plan. she agrees with it. she will call with any problems that may develop before the next visit here.   Rulon Eisenmenger, MD 03/09/2015

## 2015-03-09 NOTE — Progress Notes (Signed)
Pt tolerated first infusion of Herceptin/Taxotere/Carboplatin without any difficulty.  Pt denies any complaints at end of infusion. Pt viewed OnPro video and without questions prior to administration.  OnPro applied to L arm at end of treatment and all questions addressed.  At home medications and chemo alert card discussed.  Pt has no further questions or concerns at time of discharge.

## 2015-03-09 NOTE — Patient Instructions (Addendum)
Carboplatin injection  What is this medicine?  CARBOPLATIN (KAR boe pla tin) is a chemotherapy drug. It targets fast dividing cells, like cancer cells, and causes these cells to die. This medicine is used to treat ovarian cancer and many other cancers.  This medicine may be used for other purposes; ask your health care provider or pharmacist if you have questions.  What should I tell my health care provider before I take this medicine?  They need to know if you have any of these conditions:  -blood disorders  -hearing problems  -kidney disease  -recent or ongoing radiation therapy  -an unusual or allergic reaction to carboplatin, cisplatin, other chemotherapy, other medicines, foods, dyes, or preservatives  -pregnant or trying to get pregnant  -breast-feeding  How should I use this medicine?  This drug is usually given as an infusion into a vein. It is administered in a hospital or clinic by a specially trained health care professional.  Talk to your pediatrician regarding the use of this medicine in children. Special care may be needed.  Overdosage: If you think you have taken too much of this medicine contact a poison control center or emergency room at once.  NOTE: This medicine is only for you. Do not share this medicine with others.  What if I miss a dose?  It is important not to miss a dose. Call your doctor or health care professional if you are unable to keep an appointment.  What may interact with this medicine?  -medicines for seizures  -medicines to increase blood counts like filgrastim, pegfilgrastim, sargramostim  -some antibiotics like amikacin, gentamicin, neomycin, streptomycin, tobramycin  -vaccines  Talk to your doctor or health care professional before taking any of these medicines:  -acetaminophen  -aspirin  -ibuprofen  -ketoprofen  -naproxen  This list may not describe all possible interactions. Give your health care provider a list of all the medicines, herbs, non-prescription drugs, or dietary  supplements you use. Also tell them if you smoke, drink alcohol, or use illegal drugs. Some items may interact with your medicine.  What should I watch for while using this medicine?  Your condition will be monitored carefully while you are receiving this medicine. You will need important blood work done while you are taking this medicine.  This drug may make you feel generally unwell. This is not uncommon, as chemotherapy can affect healthy cells as well as cancer cells. Report any side effects. Continue your course of treatment even though you feel ill unless your doctor tells you to stop.  In some cases, you may be given additional medicines to help with side effects. Follow all directions for their use.  Call your doctor or health care professional for advice if you get a fever, chills or sore throat, or other symptoms of a cold or flu. Do not treat yourself. This drug decreases your body's ability to fight infections. Try to avoid being around people who are sick.  This medicine may increase your risk to bruise or bleed. Call your doctor or health care professional if you notice any unusual bleeding.  Be careful brushing and flossing your teeth or using a toothpick because you may get an infection or bleed more easily. If you have any dental work done, tell your dentist you are receiving this medicine.  Avoid taking products that contain aspirin, acetaminophen, ibuprofen, naproxen, or ketoprofen unless instructed by your doctor. These medicines may hide a fever.  Do not become pregnant while taking this medicine.   Do not breast-feed an infant while taking this medicine. What side effects may I notice from receiving this medicine? Side effects that you should report to your  doctor or health care professional as soon as possible: -allergic reactions like skin rash, itching or hives, swelling of the face, lips, or tongue -signs of infection - fever or chills, cough, sore throat, pain or difficulty passing urine -signs of decreased platelets or bleeding - bruising, pinpoint red spots on the skin, black, tarry stools, nosebleeds -signs of decreased red blood cells - unusually weak or tired, fainting spells, lightheadedness -breathing problems -changes in hearing -changes in vision -chest pain -high blood pressure -low blood counts - This drug may decrease the number of white blood cells, red blood cells and platelets. You may be at increased risk for infections and bleeding. -nausea and vomiting -pain, swelling, redness or irritation at the injection site -pain, tingling, numbness in the hands or feet -problems with balance, talking, walking -trouble passing urine or change in the amount of urine Side effects that usually do not require medical attention (report to your doctor or health care professional if they continue or are bothersome): -hair loss -loss of appetite -metallic taste in the mouth or changes in taste This list may not describe all possible side effects. Call your doctor for medical advice about side effects. You may report side effects to FDA at 1-800-FDA-1088. Where should I keep my medicine? This drug is given in a hospital or clinic and will not be stored at home. NOTE: This sheet is a summary. It may not cover all possible information. If you have questions about this medicine, talk to your doctor, pharmacist, or health care provider.    2016, Elsevier/[employer] Standard. (2007-04-10 14:38:05)   Docetaxel injection What is this medicine? DOCETAXEL (doe se TAX el) is a chemotherapy drug. It targets fast dividing cells, like cancer cells, and causes these cells to die. This medicine is used to treat many types of cancers like breast cancer,  certain stomach cancers, head and neck cancer, lung cancer, and prostate cancer. This medicine may be used for other purposes; ask your health care provider or pharmacist if you have questions. What should I tell my health care provider before I take this medicine? They need to know if you have any of these conditions: -infection (especially a virus infection such as chickenpox, cold sores, or herpes) -liver disease -low blood counts, like low white cell, platelet, or red cell counts -an unusual or allergic reaction to docetaxel, polysorbate 80, other chemotherapy agents, other medicines, foods, dyes, or preservatives -pregnant or trying to get pregnant -breast-feeding How should I use this medicine? This drug is given as an infusion into a vein. It is administered in a hospital or clinic by a specially trained health care professional. Talk to your pediatrician regarding the use of this medicine in children. Special care may be needed. Overdosage: If you think you have taken too much of this medicine contact a poison control center or emergency room at once. NOTE: This medicine is only for you. Do not share this medicine with others. What if I miss a dose? It is important not to miss your dose. Call your doctor or health care professional if you are unable to keep an appointment. What may interact with this medicine? -cyclosporine -erythromycin -ketoconazole -medicines to increase blood counts like filgrastim, pegfilgrastim, sargramostim -vaccines Talk to your doctor or health care professional before taking any of these medicines: -acetaminophen -aspirin -ibuprofen -ketoprofen -  naproxen This list may not describe all possible interactions. Give your health care provider a list of all the medicines, herbs, non-prescription drugs, or dietary supplements you use. Also tell them if you smoke, drink alcohol, or use illegal drugs. Some items may interact with your medicine. What should I  watch for while using this medicine? Your condition will be monitored carefully while you are receiving this medicine. You will need important blood work done while you are taking this medicine. This drug may make you feel generally unwell. This is not uncommon, as chemotherapy can affect healthy cells as well as cancer cells. Report any side effects. Continue your course of treatment even though you feel ill unless your doctor tells you to stop. In some cases, you may be given additional medicines to help with side effects. Follow all directions for their use. Call your doctor or health care professional for advice if you get a fever, chills or sore throat, or other symptoms of a cold or flu. Do not treat yourself. This drug decreases your body's ability to fight infections. Try to avoid being around people who are sick. This medicine may increase your risk to bruise or bleed. Call your doctor or health care professional if you notice any unusual bleeding. This medicine may contain alcohol in the product. You may get drowsy or dizzy. Do not drive, use machinery, or do anything that needs mental alertness until you know how this medicine affects you. Do not stand or sit up quickly, especially if you are an older patient. This reduces the risk of dizzy or fainting spells. Avoid alcoholic drinks. Do not become pregnant while taking this medicine. Women should inform their doctor if they wish to become pregnant or think they might be pregnant. There is a potential for serious side effects to an unborn child. Talk to your health care professional or pharmacist for more information. Do not breast-feed an infant while taking this medicine. What side effects may I notice from receiving this medicine? Side effects that you should report to your doctor or health care professional as soon as possible: -allergic reactions like skin rash, itching or hives, swelling of the face, lips, or tongue -low blood counts -  This drug may decrease the number of white blood cells, red blood cells and platelets. You may be at increased risk for infections and bleeding. -signs of infection - fever or chills, cough, sore throat, pain or difficulty passing urine -signs of decreased platelets or bleeding - bruising, pinpoint red spots on the skin, black, tarry stools, nosebleeds -signs of decreased red blood cells - unusually weak or tired, fainting spells, lightheadedness -breathing problems -fast or irregular heartbeat -low blood pressure -mouth sores -nausea and vomiting -pain, swelling, redness or irritation at the injection site -pain, tingling, numbness in the hands or feet -swelling of the ankle, feet, hands -weight gain Side effects that usually do not require medical attention (report to your prescriber or health care professional if they continue or are bothersome): -bone pain -complete hair loss including hair on your head, underarms, pubic hair, eyebrows, and eyelashes -diarrhea -excessive tearing -changes in the color of fingernails -loosening of the fingernails -nausea -muscle pain -red flush to skin -sweating -weak or tired This list may not describe all possible side effects. Call your doctor for medical advice about side effects. You may report side effects to FDA at 1-800-FDA-1088. Where should I keep my medicine? This drug is given in a hospital or clinic and will not   at home. NOTE: This sheet is a summary. It may not cover all possible information. If you have questions about this medicine, talk to your doctor, pharmacist, or health care provider.    2016, Elsevier/Gold Standard. (2014-01-20 16:04:57) Trastuzumab injection for infusion What is this medicine? TRASTUZUMAB (tras TOO zoo mab) is a monoclonal antibody. It is used to treat breast cancer and stomach cancer. This medicine may be used for other purposes; ask your health care provider or pharmacist if you have questions. What should I  tell my health care provider before I take this medicine? They need to know if you have any of these conditions: -heart disease -heart failure -infection (especially a virus infection such as chickenpox, cold sores, or herpes) -lung or breathing disease, like asthma -recent or ongoing radiation therapy -an unusual or allergic reaction to trastuzumab, benzyl alcohol, or other medications, foods, dyes, or preservatives -pregnant or trying to get pregnant -breast-feeding How should I use this medicine? This drug is given as an infusion into a vein. It is administered in a hospital or clinic by a specially trained health care professional. Talk to your pediatrician regarding the use of this medicine in children. This medicine is not approved for use in children. Overdosage: If you think you have taken too much of this medicine contact a poison control center or emergency room at once. NOTE: This medicine is only for you. Do not share this medicine with others. What if I miss a dose? It is important not to miss a dose. Call your doctor or health care professional if you are unable to keep an appointment. What may interact with this medicine? -doxorubicin -warfarin This list may not describe all possible interactions. Give your health care provider a list of all the medicines, herbs, non-prescription drugs, or dietary supplements you use. Also tell them if you smoke, drink alcohol, or use illegal drugs. Some items may interact with your medicine. What should I watch for while using this medicine? Visit your doctor for checks on your progress. Report any side effects. Continue your course of treatment even though you feel ill unless your doctor tells you to stop. Call your doctor or health care professional for advice if you get a fever, chills or sore throat, or other symptoms of a cold or flu. Do not treat yourself. Try to avoid being around people who are sick. You may experience fever, chills  and shaking during your first infusion. These effects are usually mild and can be treated with other medicines. Report any side effects during the infusion to your health care professional. Fever and chills usually do not happen with later infusions. Do not become pregnant while taking this medicine or for 7 months after stopping it. Women should inform their doctor if they wish to become pregnant or think they might be pregnant. Women of child-bearing potential will need to have a negative pregnancy test before starting this medicine. There is a potential for serious side effects to an unborn child. Talk to your health care professional or pharmacist for more information. Do not breast-feed an infant while taking this medicine or for 7 months after stopping it. Women must use effective birth control with this medicine. What side effects may I notice from receiving this medicine? Side effects that you should report to your doctor or other health care professional as soon as possible: -breathing difficulties -chest pain or palpitations -cough -dizziness or fainting -fever or chills, sore throat -skin rash, itching or hives -swelling of  the legs or ankles -unusually weak or tired Side effects that usually do not require medical attention (report to your doctor or other health care professional if they continue or are bothersome): -loss of appetite -headache -muscle aches -nausea This list may not describe all possible side effects. Call your doctor for medical advice about side effects. You may report side effects to FDA at 1-800-FDA-1088. Where should I keep my medicine? This drug is given in a hospital or clinic and will not be stored at home. NOTE: This sheet is a summary. It may not cover all possible information. If you have questions about this medicine, talk to your doctor, pharmacist, or health care provider.    2016, Elsevier/Gold Standard. (2014-04-11 11:49:32)  [facility] Discharge Instructions for Patients Receiving Chemotherapy  Today you received the following chemotherapy agents Herceptin/Taxotere/Carboplatin.  To help prevent nausea and vomiting after your treatment, we encourage you to take your nausea medication as directed.   If you develop nausea and vomiting that is not controlled by your nausea medication, call the clinic.   BELOW ARE SYMPTOMS THAT SHOULD BE REPORTED IMMEDIATELY:  *FEVER GREATER THAN 100.5 F  *CHILLS WITH OR WITHOUT FEVER  NAUSEA AND VOMITING THAT IS NOT CONTROLLED WITH YOUR NAUSEA MEDICATION  *UNUSUAL SHORTNESS OF BREATH  *UNUSUAL BRUISING OR BLEEDING  TENDERNESS IN MOUTH AND THROAT WITH OR WITHOUT PRESENCE OF ULCERS  *URINARY PROBLEMS  *BOWEL PROBLEMS  UNUSUAL RASH Items with * indicate a potential emergency and should be followed up as soon as possible.  Feel free to call the clinic you have any questions or concerns. The clinic phone number is [phone].  Please show the [facility] at check-in to the Emergency Department and triage nurse.

## 2015-03-15 NOTE — Assessment & Plan Note (Signed)
Right lumpectomy 02/03/2015: Invasive ductal carcinoma grade 2, 2.1 cm, with associated DCIS, margins are negative, 0/1 lymph node, ER 0%, PR 0%, HER-2 positive ratio 2.31, T2 N0 stage II a. 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 Adjuvant chemotherapy with TCH 6 cycles followed by Herceptin maintenance for 1 year 2. Followed by radiation therapy  Current Treatment: Cycle 1 day 8 TCH ECHO 01/30/15: EF 65-70% Anti emetics were reviewed All of their questions have been answered  RTC in 2 weeks for cycle 2

## 2015-03-16 ENCOUNTER — Other Ambulatory Visit (HOSPITAL_BASED_OUTPATIENT_CLINIC_OR_DEPARTMENT_OTHER): Payer: Medicare Other

## 2015-03-16 ENCOUNTER — Encounter: Payer: Self-pay | Admitting: Hematology and Oncology

## 2015-03-16 ENCOUNTER — Ambulatory Visit (HOSPITAL_BASED_OUTPATIENT_CLINIC_OR_DEPARTMENT_OTHER): Payer: Medicare Other | Admitting: Hematology and Oncology

## 2015-03-16 VITALS — BP 123/58 | HR 82 | Temp 98.0°F | Resp 18 | Ht 68.25 in | Wt 135.8 lb

## 2015-03-16 DIAGNOSIS — Z1501 Genetic susceptibility to malignant neoplasm of breast: Secondary | ICD-10-CM

## 2015-03-16 DIAGNOSIS — Z171 Estrogen receptor negative status [ER-]: Secondary | ICD-10-CM | POA: Diagnosis not present

## 2015-03-16 DIAGNOSIS — Z1589 Genetic susceptibility to other disease: Secondary | ICD-10-CM

## 2015-03-16 DIAGNOSIS — C50311 Malignant neoplasm of lower-inner quadrant of right female breast: Secondary | ICD-10-CM

## 2015-03-16 DIAGNOSIS — Z1509 Genetic susceptibility to other malignant neoplasm: Secondary | ICD-10-CM

## 2015-03-16 LAB — CBC WITH DIFFERENTIAL/PLATELET
BASO%: 0.3 % (ref 0.0–2.0)
BASOS ABS: 0 10*3/uL (ref 0.0–0.1)
EOS ABS: 0 10*3/uL (ref 0.0–0.5)
EOS%: 0.1 % (ref 0.0–7.0)
HCT: 41.1 % (ref 34.8–46.6)
HEMOGLOBIN: 13.8 g/dL (ref 11.6–15.9)
LYMPH%: 6.6 % — AB (ref 14.0–49.7)
MCH: 31.6 pg (ref 25.1–34.0)
MCHC: 33.6 g/dL (ref 31.5–36.0)
MCV: 94 fL (ref 79.5–101.0)
MONO#: 0.1 10*3/uL (ref 0.1–0.9)
MONO%: 0.5 % (ref 0.0–14.0)
NEUT%: 92.5 % — ABNORMAL HIGH (ref 38.4–76.8)
NEUTROS ABS: 9.7 10*3/uL — AB (ref 1.5–6.5)
PLATELETS: 147 10*3/uL (ref 145–400)
RBC: 4.37 10*6/uL (ref 3.70–5.45)
RDW: 13.7 % (ref 11.2–14.5)
WBC: 10.5 10*3/uL — AB (ref 3.9–10.3)
lymph#: 0.7 10*3/uL — ABNORMAL LOW (ref 0.9–3.3)

## 2015-03-16 LAB — COMPREHENSIVE METABOLIC PANEL
ALBUMIN: 3.8 g/dL (ref 3.5–5.0)
ALK PHOS: 116 U/L (ref 40–150)
ALT: 48 U/L (ref 0–55)
ANION GAP: 9 meq/L (ref 3–11)
AST: 22 U/L (ref 5–34)
BILIRUBIN TOTAL: 0.69 mg/dL (ref 0.20–1.20)
BUN: 18.7 mg/dL (ref 7.0–26.0)
CO2: 26 mEq/L (ref 22–29)
Calcium: 9.8 mg/dL (ref 8.4–10.4)
Chloride: 101 mEq/L (ref 98–109)
Creatinine: 1.1 mg/dL (ref 0.6–1.1)
EGFR: 52 mL/min/{1.73_m2} — AB (ref >90)
Glucose: 97 mg/dl (ref 70–140)
POTASSIUM: 4.3 meq/L (ref 3.5–5.1)
Sodium: 136 mEq/L (ref 136–145)
TOTAL PROTEIN: 6.8 g/dL (ref 6.4–8.3)

## 2015-03-16 NOTE — Progress Notes (Signed)
Patient Care Team: [name], MD as PCP - General (Internal Medicine) [name], MD as Consulting Physician (General Surgery) [name], MD as Consulting Physician (Hematology and Oncology)  DIAGNOSIS: No matching staging information was found for the patient.  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 Today cycle 1 day 8. She tolerated cycle 1 fairly well. She initially had constipation and that may relax and that led to diarrhea that lasted for 4 days. She still has occasional loose stools. She had tried to drink more water and liquids. He did have mild nausea for which she took Zofran and Compazine.  REVIEW OF SYSTEMS:   Constitutional: Denies fevers, chills or abnormal weight loss Eyes: Denies blurriness of vision Ears, nose, mouth, throat, and face: Denies mucositis or sore throat Respiratory: Denies cough, dyspnea or wheezes Cardiovascular: Denies palpitation, chest discomfort Gastrointestinal: Diarrhea Skin: Denies abnormal skin rashes Lymphatics: Denies new lymphadenopathy or easy  bruising Neurological:Denies numbness, tingling or new weaknesses Behavioral/Psych: Mood is stable, no new changes  Extremities: No lower extremity edema Breast:  denies any pain or lumps or nodules in either breasts All other systems were reviewed with the patient and are negative.  I have reviewed the past medical history, past surgical history, social history and family history with the patient and they are unchanged from previous note.  ALLERGIES:  has No Known Allergies.  MEDICATIONS:  Current Outpatient Prescriptions  Medication Sig Dispense Refill  . ALPRAZolam (XANAX) 0.25 MG tablet TAKE 1-2 TABLETS BY MOUTH 2 TIMES DAILY AS NEEDED  0  . Calcium Carbonate-Vitamin D (CALCIUM 600 + D PO) Take 1 tablet by mouth daily.     . Cholecalciferol (VITAMIN D) 2000 UNITS tablet Take 2,000 Units by mouth daily.      . CRESTOR 20 MG tablet Take 1 tablet by mouth daily.  0  . dexamethasone (DECADRON) 4 MG tablet Take 1 tablet (4 mg total) by mouth daily. Start the day before Taxotere. Then again the day after chemo for 2 days. 30 tablet 1  . HYDROcodone-acetaminophen (NORCO/VICODIN) 5-325 MG tablet Take 1-2 tablets by mouth every 6 (six) hours as needed. 30 tablet 0  . lidocaine-prilocaine (EMLA) cream Apply to affected area once 30 g 3  . LORazepam (ATIVAN) 0.5 MG tablet Take 1 tablet (0.5 mg total) by mouth at bedtime. 30 tablet 0  . ondansetron (ZOFRAN) 8 MG tablet Take 1 tablet (8 mg total) by mouth 2 (two) times daily as needed for refractory nausea / vomiting. Start on day 3 after chemo. 30 tablet 1  . prochlorperazine (COMPAZINE) 10 MG tablet Take 1 tablet (10 mg total)  by mouth every 6 (six) hours as needed (Nausea or vomiting). 30 tablet 1  . solifenacin (VESICARE) 5 MG tablet Take 10 mg by mouth daily.    Marland Kitchen telmisartan-hydrochlorothiazide (MICARDIS HCT) 80-12.5 MG per tablet Take 1 tablet by mouth daily.      . zoledronic acid (RECLAST) 5 MG/100ML SOLN Inject 5 mg into the vein once.  annually      No current facility-administered medications for this visit.    PHYSICAL EXAMINATION: ECOG PERFORMANCE STATUS: 1 - Symptomatic but completely ambulatory  Filed Vitals:   03/16/15 1007  BP: 123/58  Pulse: 82  Temp: 98 F (36.7 C)  Resp: 18   Filed Weights   03/16/15 1007  Weight: 135 lb 12.8 oz (61.598 kg)    GENERAL:alert, no distress and comfortable SKIN: skin color, texture, turgor are normal, no rashes or significant lesions EYES: normal, Conjunctiva are pink and non-injected, sclera clear OROPHARYNX:no exudate, no erythema and lips, buccal mucosa, and tongue normal  NECK: supple, thyroid normal size, non-tender, without nodularity LYMPH:  no palpable lymphadenopathy in the cervical, axillary or inguinal LUNGS: clear to auscultation and percussion with normal breathing effort HEART: regular rate & rhythm and no murmurs and no lower extremity edema ABDOMEN:abdomen soft, non-tender and normal bowel sounds MUSCULOSKELETAL:no cyanosis of digits and no clubbing  NEURO: alert & oriented x 3 with fluent speech, no focal motor/sensory deficits EXTREMITIES: No lower extremity edema LABORATORY DATA:  I have reviewed the data as listed   Chemistry      Component Value Date/Time   NA 142 03/09/2015 0938   NA 142 01/29/2015 1545   K 3.6 03/09/2015 0938   K 4.5 01/29/2015 1545   CL 108 01/29/2015 1545   CO2 25 03/09/2015 0938   CO2 26 01/29/2015 1545   BUN 21.1 03/09/2015 0938   BUN 29* 01/29/2015 1545   CREATININE 0.9 03/09/2015 0938   CREATININE 1.12* 01/29/2015 1545      Component Value Date/Time   CALCIUM 9.9 03/09/2015 0938   CALCIUM 9.6 01/29/2015 1545   ALKPHOS 104 03/09/2015 0938   AST 29 03/09/2015 0938   ALT 41 03/09/2015 0938   BILITOT 0.54 03/09/2015 0938      Lab Results  Component Value Date   WBC 10.5* 03/16/2015   HGB 13.8 03/16/2015   HCT 41.1 03/16/2015   MCV 94.0 03/16/2015   PLT 147 03/16/2015   NEUTROABS 9.7* 03/16/2015    ASSESSMENT & PLAN:  Breast cancer of lower-inner quadrant of right female breast ([place]) Right lumpectomy 02/03/2015: Invasive ductal carcinoma grade 2, 2.1 cm, with associated DCIS, margins are negative, 0/1 lymph node, ER 0%, PR 0%, HER-2 positive ratio 2.31, T2 N0 stage II a. 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 Adjuvant chemotherapy with TCH 6 cycles followed by Herceptin maintenance for 1 year Started 03/09/2015 2. Followed by radiation therapy -------------------------------------------------------------------------------------------------------------------------------------------------------- Current Treatment: Cycle 1 day 8 TCH ECHO 01/30/15: EF 65-70% Chemotherapy toxicities: 1. Constipation followed by diarrhea  2. Nausea grade 1  3. Fatigue grade 2 4. Insomnia: Takes Ativan periodically   I reviewed her blood work especially her electrolytes. They are normal. So we did not need to give her any fluids or twice today. RTC in 2 weeks for cycle 2  No orders of the defined types were  placed in this encounter.   The patient has a good understanding of the overall plan. she agrees with it. she will call with any problems that may develop before the next visit here.   Rulon Eisenmenger, MD 03/16/2015

## 2015-03-17 ENCOUNTER — Telehealth: Payer: Self-pay | Admitting: Hematology and Oncology

## 2015-03-17 NOTE — Telephone Encounter (Signed)
Scheduled patient appt per pof, avs report printed.   Staff message sent to [facility] to adjust chemo time.

## 2015-03-18 ENCOUNTER — Telehealth: Payer: Self-pay | Admitting: *Deleted

## 2015-03-18 NOTE — Telephone Encounter (Signed)
I have adjusted 3/13 appts

## 2015-03-28 NOTE — Assessment & Plan Note (Signed)
Right lumpectomy 02/03/2015: Invasive ductal carcinoma grade 2, 2.1 cm, with associated DCIS, margins are negative, 0/1 lymph node, ER 0%, PR 0%, HER-2 positive ratio 2.31, T2 N0 stage II a. 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 Adjuvant chemotherapy with TCH 6 cycles followed by Herceptin maintenance for 1 year Started 03/09/2015 2. Followed by radiation therapy -------------------------------------------------------------------------------------------------------------------------------------------------------- Current Treatment: Cycle 2 day 1 TCH ECHO 01/30/15: EF 65-70% Chemotherapy toxicities: 1. Constipation followed by diarrhea  2. Nausea grade 1  3. Fatigue grade 2 4. Insomnia: Takes Ativan periodically  I reviewed her blood work especially her electrolytes. They are normal. So we did not need to give her any fluids or twice today. RTC in 3 weeks for cycle 3

## 2015-03-30 ENCOUNTER — Ambulatory Visit (HOSPITAL_BASED_OUTPATIENT_CLINIC_OR_DEPARTMENT_OTHER): Payer: Medicare Other

## 2015-03-30 ENCOUNTER — Other Ambulatory Visit (HOSPITAL_BASED_OUTPATIENT_CLINIC_OR_DEPARTMENT_OTHER): Payer: Medicare Other

## 2015-03-30 ENCOUNTER — Telehealth: Payer: Self-pay | Admitting: Hematology and Oncology

## 2015-03-30 ENCOUNTER — Ambulatory Visit (HOSPITAL_BASED_OUTPATIENT_CLINIC_OR_DEPARTMENT_OTHER): Payer: Medicare Other | Admitting: Hematology and Oncology

## 2015-03-30 ENCOUNTER — Encounter: Payer: Self-pay | Admitting: *Deleted

## 2015-03-30 ENCOUNTER — Encounter: Payer: Self-pay | Admitting: Hematology and Oncology

## 2015-03-30 VITALS — BP 116/57 | HR 71 | Temp 98.0°F | Resp 18 | Ht 68.25 in | Wt 137.8 lb

## 2015-03-30 DIAGNOSIS — Z5189 Encounter for other specified aftercare: Secondary | ICD-10-CM

## 2015-03-30 DIAGNOSIS — C50311 Malignant neoplasm of lower-inner quadrant of right female breast: Secondary | ICD-10-CM | POA: Diagnosis not present

## 2015-03-30 DIAGNOSIS — Z171 Estrogen receptor negative status [ER-]: Secondary | ICD-10-CM

## 2015-03-30 DIAGNOSIS — Z5112 Encounter for antineoplastic immunotherapy: Secondary | ICD-10-CM | POA: Diagnosis not present

## 2015-03-30 DIAGNOSIS — Z5111 Encounter for antineoplastic chemotherapy: Secondary | ICD-10-CM

## 2015-03-30 LAB — CBC WITH DIFFERENTIAL/PLATELET
BASO%: 0.7 % (ref 0.0–2.0)
Basophils Absolute: 0.1 10*3/uL (ref 0.0–0.1)
EOS ABS: 0 10*3/uL (ref 0.0–0.5)
EOS%: 0.1 % (ref 0.0–7.0)
HCT: 36.2 % (ref 34.8–46.6)
HGB: 12.2 g/dL (ref 11.6–15.9)
LYMPH%: 13.4 % — AB (ref 14.0–49.7)
MCH: 31.7 pg (ref 25.1–34.0)
MCHC: 33.7 g/dL (ref 31.5–36.0)
MCV: 94 fL (ref 79.5–101.0)
MONO#: 0.4 10*3/uL (ref 0.1–0.9)
MONO%: 5.7 % (ref 0.0–14.0)
NEUT%: 80.1 % — AB (ref 38.4–76.8)
NEUTROS ABS: 5.8 10*3/uL (ref 1.5–6.5)
PLATELETS: 237 10*3/uL (ref 145–400)
RBC: 3.85 10*6/uL (ref 3.70–5.45)
RDW: 13.9 % (ref 11.2–14.5)
WBC: 7.2 10*3/uL (ref 3.9–10.3)
lymph#: 1 10*3/uL (ref 0.9–3.3)

## 2015-03-30 LAB — COMPREHENSIVE METABOLIC PANEL
ALT: 28 U/L (ref 0–55)
ANION GAP: 10 meq/L (ref 3–11)
AST: 23 U/L (ref 5–34)
Albumin: 3.7 g/dL (ref 3.5–5.0)
Alkaline Phosphatase: 106 U/L (ref 40–150)
BUN: 26.2 mg/dL — ABNORMAL HIGH (ref 7.0–26.0)
CHLORIDE: 107 meq/L (ref 98–109)
CO2: 23 meq/L (ref 22–29)
Calcium: 9.2 mg/dL (ref 8.4–10.4)
Creatinine: 0.9 mg/dL (ref 0.6–1.1)
EGFR: 61 mL/min/{1.73_m2} — AB (ref >90)
GLUCOSE: 104 mg/dL (ref 70–140)
POTASSIUM: 3.9 meq/L (ref 3.5–5.1)
SODIUM: 141 meq/L (ref 136–145)
TOTAL PROTEIN: 6.4 g/dL (ref 6.4–8.3)
Total Bilirubin: 0.53 mg/dL (ref 0.20–1.20)

## 2015-03-30 MED ORDER — HEPARIN SOD (PORK) LOCK FLUSH 100 UNIT/ML IV SOLN
500.0000 [IU] | Freq: Once | INTRAVENOUS | Status: AC | PRN
  Administered 2015-03-30: 500 [IU]
  Filled 2015-03-30: qty 5

## 2015-03-30 MED ORDER — ACETAMINOPHEN 325 MG PO TABS
650.0000 mg | ORAL_TABLET | Freq: Once | ORAL | Status: AC
  Administered 2015-03-30: 650 mg via ORAL

## 2015-03-30 MED ORDER — ACETAMINOPHEN 325 MG PO TABS
ORAL_TABLET | ORAL | Status: AC
  Filled 2015-03-30: qty 2

## 2015-03-30 MED ORDER — DEXTROSE 5 % IV SOLN
75.0000 mg/m2 | Freq: Once | INTRAVENOUS | Status: AC
  Administered 2015-03-30: 130 mg via INTRAVENOUS
  Filled 2015-03-30: qty 13

## 2015-03-30 MED ORDER — SODIUM CHLORIDE 0.9 % IV SOLN
Freq: Once | INTRAVENOUS | Status: AC
  Administered 2015-03-30: 10:00:00 via INTRAVENOUS

## 2015-03-30 MED ORDER — DIPHENHYDRAMINE HCL 25 MG PO CAPS
ORAL_CAPSULE | ORAL | Status: AC
  Filled 2015-03-30: qty 2

## 2015-03-30 MED ORDER — SODIUM CHLORIDE 0.9% FLUSH
10.0000 mL | INTRAVENOUS | Status: DC | PRN
  Administered 2015-03-30: 10 mL
  Filled 2015-03-30: qty 10

## 2015-03-30 MED ORDER — DIPHENHYDRAMINE HCL 25 MG PO CAPS
50.0000 mg | ORAL_CAPSULE | Freq: Once | ORAL | Status: AC
  Administered 2015-03-30: 50 mg via ORAL

## 2015-03-30 MED ORDER — SODIUM CHLORIDE 0.9 % IV SOLN
10.0000 mg | Freq: Once | INTRAVENOUS | Status: AC
  Administered 2015-03-30: 10 mg via INTRAVENOUS
  Filled 2015-03-30: qty 1

## 2015-03-30 MED ORDER — SODIUM CHLORIDE 0.9 % IV SOLN
449.4000 mg | Freq: Once | INTRAVENOUS | Status: AC
  Administered 2015-03-30: 450 mg via INTRAVENOUS
  Filled 2015-03-30: qty 45

## 2015-03-30 MED ORDER — PEGFILGRASTIM 6 MG/0.6ML ~~LOC~~ PSKT
6.0000 mg | PREFILLED_SYRINGE | Freq: Once | SUBCUTANEOUS | Status: AC
  Administered 2015-03-30: 6 mg via SUBCUTANEOUS
  Filled 2015-03-30: qty 0.6

## 2015-03-30 MED ORDER — PALONOSETRON HCL INJECTION 0.25 MG/5ML
0.2500 mg | Freq: Once | INTRAVENOUS | Status: AC
  Administered 2015-03-30: 0.25 mg via INTRAVENOUS

## 2015-03-30 MED ORDER — PALONOSETRON HCL INJECTION 0.25 MG/5ML
INTRAVENOUS | Status: AC
  Filled 2015-03-30: qty 5

## 2015-03-30 MED ORDER — TRASTUZUMAB CHEMO INJECTION 440 MG
6.0000 mg/kg | Freq: Once | INTRAVENOUS | Status: AC
  Administered 2015-03-30: 378 mg via INTRAVENOUS
  Filled 2015-03-30: qty 18

## 2015-03-30 NOTE — Progress Notes (Signed)
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 Today is cycle #2. For the past 2 weeks she had felt reasonably well. She did not have any major problems. Her appetite is fully back. She did not have any further trouble with constipation or diarrhea.  REVIEW OF SYSTEMS:   Constitutional: Denies fevers, chills or abnormal weight loss Eyes: Denies blurriness of vision Ears, nose, mouth, throat, and face: Denies mucositis or sore throat Respiratory: Denies cough, dyspnea or wheezes Cardiovascular: Denies palpitation, chest discomfort Gastrointestinal:  Denies nausea, heartburn or change in bowel habits Skin: Denies abnormal skin rashes Lymphatics: Denies new lymphadenopathy or easy bruising Neurological:Denies numbness, tingling or new weaknesses Behavioral/Psych: Mood is stable, no new changes  Extremities: No lower extremity  edema Breast:  denies any pain or lumps or nodules in either breasts All other systems were reviewed with the patient and are negative.  I have reviewed the past medical history, past surgical history, social history and family history with the patient and they are unchanged from previous note.  ALLERGIES:  has No Known Allergies.  MEDICATIONS:  Current Outpatient Prescriptions  Medication Sig Dispense Refill  . ALPRAZolam (XANAX) 0.25 MG tablet TAKE 1-2 TABLETS BY MOUTH 2 TIMES DAILY AS NEEDED  0  . Calcium Carbonate-Vitamin D (CALCIUM 600 + D PO) Take 1 tablet by mouth daily.     . Cholecalciferol (VITAMIN D) 2000 UNITS tablet Take 2,000 Units by mouth daily.      . CRESTOR 20 MG tablet Take 1 tablet by mouth daily.  0  . dexamethasone (DECADRON) 4 MG tablet Take 1 tablet (4 mg total) by mouth daily. Start the day before Taxotere. Then again the day after chemo for 2 days. 30 tablet 1  . HYDROcodone-acetaminophen (NORCO/VICODIN) 5-325 MG tablet Take 1-2 tablets by mouth every 6 (six) hours as needed. 30 tablet 0  . lidocaine-prilocaine (EMLA) cream Apply to affected area once 30 g 3  . LORazepam (ATIVAN) 0.5 MG tablet Take 1 tablet (0.5 mg total) by mouth at bedtime. 30 tablet 0  . ondansetron (ZOFRAN) 8 MG tablet Take 1 tablet (8 mg total) by mouth 2 (two) times daily as needed for refractory nausea / vomiting. Start on day 3 after chemo. 30 tablet 1  . prochlorperazine (COMPAZINE) 10 MG tablet Take 1 tablet (10 mg total) by mouth every 6 (six) hours as needed (Nausea or vomiting). 30 tablet 1  . solifenacin (VESICARE) 5 MG tablet Take 10 mg  by mouth daily.    Marland Kitchen telmisartan-hydrochlorothiazide (MICARDIS HCT) 80-12.5 MG per tablet Take 1 tablet by mouth daily.      . zoledronic acid (RECLAST) 5 MG/100ML SOLN Inject 5 mg into the vein once. annually      No current facility-administered medications for this visit.    PHYSICAL EXAMINATION: ECOG PERFORMANCE STATUS: 1 - Symptomatic but  completely ambulatory  Filed Vitals:   03/30/15 0844  BP: 116/57  Pulse: 71  Temp: 98 F (36.7 C)  Resp: 18   Filed Weights   03/30/15 0844  Weight: 137 lb 12.8 oz (62.506 kg)    GENERAL:alert, no distress and comfortable SKIN: skin color, texture, turgor are normal, no rashes or significant lesions EYES: normal, Conjunctiva are pink and non-injected, sclera clear OROPHARYNX:no exudate, no erythema and lips, buccal mucosa, and tongue normal  NECK: supple, thyroid normal size, non-tender, without nodularity LYMPH:  no palpable lymphadenopathy in the cervical, axillary or inguinal LUNGS: clear to auscultation and percussion with normal breathing effort HEART: regular rate & rhythm and no murmurs and no lower extremity edema ABDOMEN:abdomen soft, non-tender and normal bowel sounds MUSCULOSKELETAL:no cyanosis of digits and no clubbing  NEURO: alert & oriented x 3 with fluent speech, no focal motor/sensory deficits EXTREMITIES: No lower extremity edema  LABORATORY DATA:  I have reviewed the data as listed   Chemistry      Component Value Date/Time   NA 136 03/16/2015 0955   NA 142 01/29/2015 1545   K 4.3 03/16/2015 0955   K 4.5 01/29/2015 1545   CL 108 01/29/2015 1545   CO2 26 03/16/2015 0955   CO2 26 01/29/2015 1545   BUN 18.7 03/16/2015 0955   BUN 29* 01/29/2015 1545   CREATININE 1.1 03/16/2015 0955   CREATININE 1.12* 01/29/2015 1545      Component Value Date/Time   CALCIUM 9.8 03/16/2015 0955   CALCIUM 9.6 01/29/2015 1545   ALKPHOS 116 03/16/2015 0955   AST 22 03/16/2015 0955   ALT 48 03/16/2015 0955   BILITOT 0.69 03/16/2015 0955       Lab Results  Component Value Date   WBC 7.2 03/30/2015   HGB 12.2 03/30/2015   HCT 36.2 03/30/2015   MCV 94.0 03/30/2015   PLT 237 03/30/2015   NEUTROABS 5.8 03/30/2015     ASSESSMENT & PLAN:  Breast cancer of lower-inner quadrant of right female breast ([place]) Right lumpectomy 02/03/2015: Invasive ductal carcinoma  grade 2, 2.1 cm, with associated DCIS, margins are negative, 0/1 lymph node, ER 0%, PR 0%, HER-2 positive ratio 2.31, T2 N0 stage II a. 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 Adjuvant chemotherapy with TCH 6 cycles followed by Herceptin maintenance for 1 year Started 03/09/2015 2. Followed by radiation therapy -------------------------------------------------------------------------------------------------------------------------------------------------------- Current Treatment: Cycle 2 day 1 TCH ECHO 01/30/15: EF 65-70% Chemotherapy toxicities: 1. Constipation followed by diarrhea  2. Nausea grade 1  3. Fatigue grade 2 4. Insomnia: Takes Ativan periodically Patient takes a medical food supplement for taste and she tells me that it's helping her.  Monitoring closely for chemotherapy toxicities I reviewed her blood work. RTC in 3 weeks for cycle 3  No orders of the defined types were placed in this encounter.   The patient has a good understanding of the overall plan. she agrees with  it. she will call with any problems that may develop before the next visit here.   Rulon Eisenmenger, MD 03/30/2015

## 2015-03-30 NOTE — Telephone Encounter (Signed)
appt made and avs printed °

## 2015-03-30 NOTE — Patient Instructions (Addendum)
Carboplatin injection  What is this medicine?  CARBOPLATIN (KAR boe pla tin) is a chemotherapy drug. It targets fast dividing cells, like cancer cells, and causes these cells to die. This medicine is used to treat ovarian cancer and many other cancers.  This medicine may be used for other purposes; ask your health care provider or pharmacist if you have questions.  What should I tell my health care provider before I take this medicine?  They need to know if you have any of these conditions:  -blood disorders  -hearing problems  -kidney disease  -recent or ongoing radiation therapy  -an unusual or allergic reaction to carboplatin, cisplatin, other chemotherapy, other medicines, foods, dyes, or preservatives  -pregnant or trying to get pregnant  -breast-feeding  How should I use this medicine?  This drug is usually given as an infusion into a vein. It is administered in a hospital or clinic by a specially trained health care professional.  Talk to your pediatrician regarding the use of this medicine in children. Special care may be needed.  Overdosage: If you think you have taken too much of this medicine contact a poison control center or emergency room at once.  NOTE: This medicine is only for you. Do not share this medicine with others.  What if I miss a dose?  It is important not to miss a dose. Call your doctor or health care professional if you are unable to keep an appointment.  What may interact with this medicine?  -medicines for seizures  -medicines to increase blood counts like filgrastim, pegfilgrastim, sargramostim  -some antibiotics like amikacin, gentamicin, neomycin, streptomycin, tobramycin  -vaccines  Talk to your doctor or health care professional before taking any of these medicines:  -acetaminophen  -aspirin  -ibuprofen  -ketoprofen  -naproxen  This list may not describe all possible interactions. Give your health care provider a list of all the medicines, herbs, non-prescription drugs, or dietary  supplements you use. Also tell them if you smoke, drink alcohol, or use illegal drugs. Some items may interact with your medicine.  What should I watch for while using this medicine?  Your condition will be monitored carefully while you are receiving this medicine. You will need important blood work done while you are taking this medicine.  This drug may make you feel generally unwell. This is not uncommon, as chemotherapy can affect healthy cells as well as cancer cells. Report any side effects. Continue your course of treatment even though you feel ill unless your doctor tells you to stop.  In some cases, you may be given additional medicines to help with side effects. Follow all directions for their use.  Call your doctor or health care professional for advice if you get a fever, chills or sore throat, or other symptoms of a cold or flu. Do not treat yourself. This drug decreases your body's ability to fight infections. Try to avoid being around people who are sick.  This medicine may increase your risk to bruise or bleed. Call your doctor or health care professional if you notice any unusual bleeding.  Be careful brushing and flossing your teeth or using a toothpick because you may get an infection or bleed more easily. If you have any dental work done, tell your dentist you are receiving this medicine.  Avoid taking products that contain aspirin, acetaminophen, ibuprofen, naproxen, or ketoprofen unless instructed by your doctor. These medicines may hide a fever.  Do not become pregnant while taking this medicine.   Do not breast-feed an infant while taking this medicine. What side effects may I notice from receiving this medicine? Side effects that you should report to your  doctor or health care professional as soon as possible: -allergic reactions like skin rash, itching or hives, swelling of the face, lips, or tongue -signs of infection - fever or chills, cough, sore throat, pain or difficulty passing urine -signs of decreased platelets or bleeding - bruising, pinpoint red spots on the skin, black, tarry stools, nosebleeds -signs of decreased red blood cells - unusually weak or tired, fainting spells, lightheadedness -breathing problems -changes in hearing -changes in vision -chest pain -high blood pressure -low blood counts - This drug may decrease the number of white blood cells, red blood cells and platelets. You may be at increased risk for infections and bleeding. -nausea and vomiting -pain, swelling, redness or irritation at the injection site -pain, tingling, numbness in the hands or feet -problems with balance, talking, walking -trouble passing urine or change in the amount of urine Side effects that usually do not require medical attention (report to your doctor or health care professional if they continue or are bothersome): -hair loss -loss of appetite -metallic taste in the mouth or changes in taste This list may not describe all possible side effects. Call your doctor for medical advice about side effects. You may report side effects to FDA at 1-800-FDA-1088. Where should I keep my medicine? This drug is given in a hospital or clinic and will not be stored at home. NOTE: This sheet is a summary. It may not cover all possible information. If you have questions about this medicine, talk to your doctor, pharmacist, or health care provider.    2016, Elsevier/Gold Standard. (2007-04-10 14:38:05)   Docetaxel injection What is this medicine? DOCETAXEL (doe se TAX el) is a chemotherapy drug. It targets fast dividing cells, like cancer cells, and causes these cells to die. This medicine is used to treat many types of cancers like breast cancer,  certain stomach cancers, head and neck cancer, lung cancer, and prostate cancer. This medicine may be used for other purposes; ask your health care provider or pharmacist if you have questions. What should I tell my health care provider before I take this medicine? They need to know if you have any of these conditions: -infection (especially a virus infection such as chickenpox, cold sores, or herpes) -liver disease -low blood counts, like low white cell, platelet, or red cell counts -an unusual or allergic reaction to docetaxel, polysorbate 80, other chemotherapy agents, other medicines, foods, dyes, or preservatives -pregnant or trying to get pregnant -breast-feeding How should I use this medicine? This drug is given as an infusion into a vein. It is administered in a hospital or clinic by a specially trained health care professional. Talk to your pediatrician regarding the use of this medicine in children. Special care may be needed. Overdosage: If you think you have taken too much of this medicine contact a poison control center or emergency room at once. NOTE: This medicine is only for you. Do not share this medicine with others. What if I miss a dose? It is important not to miss your dose. Call your doctor or health care professional if you are unable to keep an appointment. What may interact with this medicine? -cyclosporine -erythromycin -ketoconazole -medicines to increase blood counts like filgrastim, pegfilgrastim, sargramostim -vaccines Talk to your doctor or health care professional before taking any of these medicines: -acetaminophen -aspirin -ibuprofen -ketoprofen -  naproxen This list may not describe all possible interactions. Give your health care provider a list of all the medicines, herbs, non-prescription drugs, or dietary supplements you use. Also tell them if you smoke, drink alcohol, or use illegal drugs. Some items may interact with your medicine. What should I  watch for while using this medicine? Your condition will be monitored carefully while you are receiving this medicine. You will need important blood work done while you are taking this medicine. This drug may make you feel generally unwell. This is not uncommon, as chemotherapy can affect healthy cells as well as cancer cells. Report any side effects. Continue your course of treatment even though you feel ill unless your doctor tells you to stop. In some cases, you may be given additional medicines to help with side effects. Follow all directions for their use. Call your doctor or health care professional for advice if you get a fever, chills or sore throat, or other symptoms of a cold or flu. Do not treat yourself. This drug decreases your body's ability to fight infections. Try to avoid being around people who are sick. This medicine may increase your risk to bruise or bleed. Call your doctor or health care professional if you notice any unusual bleeding. This medicine may contain alcohol in the product. You may get drowsy or dizzy. Do not drive, use machinery, or do anything that needs mental alertness until you know how this medicine affects you. Do not stand or sit up quickly, especially if you are an older patient. This reduces the risk of dizzy or fainting spells. Avoid alcoholic drinks. Do not become pregnant while taking this medicine. Women should inform their doctor if they wish to become pregnant or think they might be pregnant. There is a potential for serious side effects to an unborn child. Talk to your health care professional or pharmacist for more information. Do not breast-feed an infant while taking this medicine. What side effects may I notice from receiving this medicine? Side effects that you should report to your doctor or health care professional as soon as possible: -allergic reactions like skin rash, itching or hives, swelling of the face, lips, or tongue -low blood counts -  This drug may decrease the number of white blood cells, red blood cells and platelets. You may be at increased risk for infections and bleeding. -signs of infection - fever or chills, cough, sore throat, pain or difficulty passing urine -signs of decreased platelets or bleeding - bruising, pinpoint red spots on the skin, black, tarry stools, nosebleeds -signs of decreased red blood cells - unusually weak or tired, fainting spells, lightheadedness -breathing problems -fast or irregular heartbeat -low blood pressure -mouth sores -nausea and vomiting -pain, swelling, redness or irritation at the injection site -pain, tingling, numbness in the hands or feet -swelling of the ankle, feet, hands -weight gain Side effects that usually do not require medical attention (report to your prescriber or health care professional if they continue or are bothersome): -bone pain -complete hair loss including hair on your head, underarms, pubic hair, eyebrows, and eyelashes -diarrhea -excessive tearing -changes in the color of fingernails -loosening of the fingernails -nausea -muscle pain -red flush to skin -sweating -weak or tired This list may not describe all possible side effects. Call your doctor for medical advice about side effects. You may report side effects to FDA at 1-800-FDA-1088. Where should I keep my medicine? This drug is given in a hospital or clinic and will not   at home. NOTE: This sheet is a summary. It may not cover all possible information. If you have questions about this medicine, talk to your doctor, pharmacist, or health care provider.    2016, Elsevier/Gold Standard. (2014-01-20 16:04:57) Trastuzumab injection for infusion What is this medicine? TRASTUZUMAB (tras TOO zoo mab) is a monoclonal antibody. It is used to treat breast cancer and stomach cancer. This medicine may be used for other purposes; ask your health care provider or pharmacist if you have questions. What should I  tell my health care provider before I take this medicine? They need to know if you have any of these conditions: -heart disease -heart failure -infection (especially a virus infection such as chickenpox, cold sores, or herpes) -lung or breathing disease, like asthma -recent or ongoing radiation therapy -an unusual or allergic reaction to trastuzumab, benzyl alcohol, or other medications, foods, dyes, or preservatives -pregnant or trying to get pregnant -breast-feeding How should I use this medicine? This drug is given as an infusion into a vein. It is administered in a hospital or clinic by a specially trained health care professional. Talk to your pediatrician regarding the use of this medicine in children. This medicine is not approved for use in children. Overdosage: If you think you have taken too much of this medicine contact a poison control center or emergency room at once. NOTE: This medicine is only for you. Do not share this medicine with others. What if I miss a dose? It is important not to miss a dose. Call your doctor or health care professional if you are unable to keep an appointment. What may interact with this medicine? -doxorubicin -warfarin This list may not describe all possible interactions. Give your health care provider a list of all the medicines, herbs, non-prescription drugs, or dietary supplements you use. Also tell them if you smoke, drink alcohol, or use illegal drugs. Some items may interact with your medicine. What should I watch for while using this medicine? Visit your doctor for checks on your progress. Report any side effects. Continue your course of treatment even though you feel ill unless your doctor tells you to stop. Call your doctor or health care professional for advice if you get a fever, chills or sore throat, or other symptoms of a cold or flu. Do not treat yourself. Try to avoid being around people who are sick. You may experience fever, chills  and shaking during your first infusion. These effects are usually mild and can be treated with other medicines. Report any side effects during the infusion to your health care professional. Fever and chills usually do not happen with later infusions. Do not become pregnant while taking this medicine or for 7 months after stopping it. Women should inform their doctor if they wish to become pregnant or think they might be pregnant. Women of child-bearing potential will need to have a negative pregnancy test before starting this medicine. There is a potential for serious side effects to an unborn child. Talk to your health care professional or pharmacist for more information. Do not breast-feed an infant while taking this medicine or for 7 months after stopping it. Women must use effective birth control with this medicine. What side effects may I notice from receiving this medicine? Side effects that you should report to your doctor or other health care professional as soon as possible: -breathing difficulties -chest pain or palpitations -cough -dizziness or fainting -fever or chills, sore throat -skin rash, itching or hives -swelling of  the legs or ankles -unusually weak or tired Side effects that usually do not require medical attention (report to your doctor or other health care professional if they continue or are bothersome): -loss of appetite -headache -muscle aches -nausea This list may not describe all possible side effects. Call your doctor for medical advice about side effects. You may report side effects to FDA at 1-800-FDA-1088. Where should I keep my medicine? This drug is given in a hospital or clinic and will not be stored at home. NOTE: This sheet is a summary. It may not cover all possible information. If you have questions about this medicine, talk to your doctor, pharmacist, or health care provider.    2016, Elsevier/Gold Standard. (2014-04-11 11:49:32)  [facility] Discharge Instructions for Patients Receiving Chemotherapy  Today you received the following chemotherapy agents Herceptin/Taxotere/Carboplatin.  To help prevent nausea and vomiting after your treatment, we encourage you to take your nausea medication as directed.   If you develop nausea and vomiting that is not controlled by your nausea medication, call the clinic.   BELOW ARE SYMPTOMS THAT SHOULD BE REPORTED IMMEDIATELY:  *FEVER GREATER THAN 100.5 F  *CHILLS WITH OR WITHOUT FEVER  NAUSEA AND VOMITING THAT IS NOT CONTROLLED WITH YOUR NAUSEA MEDICATION  *UNUSUAL SHORTNESS OF BREATH  *UNUSUAL BRUISING OR BLEEDING  TENDERNESS IN MOUTH AND THROAT WITH OR WITHOUT PRESENCE OF ULCERS  *URINARY PROBLEMS  *BOWEL PROBLEMS  UNUSUAL RASH Items with * indicate a potential emergency and should be followed up as soon as possible.  Feel free to call the clinic you have any questions or concerns. The clinic phone number is [phone].  Please show the [facility] at check-in to the Emergency Department and triage nurse.   Trastuzumab injection for infusion What is this medicine? TRASTUZUMAB (tras TOO zoo mab) is a monoclonal antibody. It is used to treat breast cancer and stomach cancer. This medicine may be used for other purposes; ask your health care provider or pharmacist if you have questions. What should I tell my health care provider before I take this medicine? They need to know if you have any of these conditions: -heart disease -heart failure -infection (especially a virus infection such as chickenpox, cold sores, or herpes) -lung or breathing disease, like asthma -recent or ongoing radiation therapy -an unusual or allergic reaction to trastuzumab, benzyl alcohol, or other medications, foods, dyes, or preservatives -pregnant or trying to get pregnant -breast-feeding How should I use this medicine? This drug is given as an infusion into a vein. It is  administered in a hospital or clinic by a specially trained health care professional. Talk to your pediatrician regarding the use of this medicine in children. This medicine is not approved for use in children. Overdosage: If you think you have taken too much of this medicine contact a poison control center or emergency room at once. NOTE: This medicine is only for you. Do not share this medicine with others. What if I miss a dose? It is important not to miss a dose. Call your doctor or health care professional if you are unable to keep an appointment. What may interact with this medicine? -doxorubicin -warfarin This list may not describe all possible interactions. Give your health care provider a list of all the medicines, herbs, non-prescription drugs, or dietary supplements you use. Also tell them if you smoke, drink alcohol, or use illegal drugs. Some items may interact with your medicine. What should I watch  for while using this medicine? Visit your doctor for checks on your progress. Report any side effects. Continue your course of treatment even though you feel ill unless your doctor tells you to stop. Call your doctor or health care professional for advice if you get a fever, chills or sore throat, or other symptoms of a cold or flu. Do not treat yourself. Try to avoid being around people who are sick. You may experience fever, chills and shaking during your first infusion. These effects are usually mild and can be treated with other medicines. Report any side effects during the infusion to your health care professional. Fever and chills usually do not happen with later infusions. Do not become pregnant while taking this medicine or for 7 months after stopping it. Women should inform their doctor if they wish to become pregnant or think they might be pregnant. Women of child-bearing potential will need to have a negative pregnancy test before starting this medicine. There is a potential for  serious side effects to an unborn child. Talk to your health care professional or pharmacist for more information. Do not breast-feed an infant while taking this medicine or for 7 months after stopping it. Women must use effective birth control with this medicine. What side effects may I notice from receiving this medicine? Side effects that you should report to your doctor or other health care professional as soon as possible: -breathing difficulties -chest pain or palpitations -cough -dizziness or fainting -fever or chills, sore throat -skin rash, itching or hives -swelling of the legs or ankles -unusually weak or tired Side effects that usually do not require medical attention (report to your doctor or other health care professional if they continue or are bothersome): -loss of appetite -headache -muscle aches -nausea This list may not describe all possible side effects. Call your doctor for medical advice about side effects. You may report side effects to FDA at 1-800-FDA-1088. Where should I keep my medicine? This drug is given in a hospital or clinic and will not be stored at home. NOTE: This sheet is a summary. It may not cover all possible information. If you have questions about this medicine, talk to your doctor, pharmacist, or health care provider.    2016, Elsevier/Gold Standard. (2014-04-11 11:49:32) Docetaxel injection What is this medicine? DOCETAXEL (doe se TAX el) is a chemotherapy drug. It targets fast dividing cells, like cancer cells, and causes these cells to die. This medicine is used to treat many types of cancers like breast cancer, certain stomach cancers, head and neck cancer, lung cancer, and prostate cancer. This medicine may be used for other purposes; ask your health care provider or pharmacist if you have questions. What should I tell my health care provider before I take this medicine? They need to know if you have any of these conditions: -infection  (especially a virus infection such as chickenpox, cold sores, or herpes) -liver disease -low blood counts, like low white cell, platelet, or red cell counts -an unusual or allergic reaction to docetaxel, polysorbate 80, other chemotherapy agents, other medicines, foods, dyes, or preservatives -pregnant or trying to get pregnant -breast-feeding How should I use this medicine? This drug is given as an infusion into a vein. It is administered in a hospital or clinic by a specially trained health care professional. Talk to your pediatrician regarding the use of this medicine in children. Special care may be needed. Overdosage: If you think you have taken too much of this medicine contact  a poison control center or emergency room at once. NOTE: This medicine is only for you. Do not share this medicine with others. What if I miss a dose? It is important not to miss your dose. Call your doctor or health care professional if you are unable to keep an appointment. What may interact with this medicine? -cyclosporine -erythromycin -ketoconazole -medicines to increase blood counts like filgrastim, pegfilgrastim, sargramostim -vaccines Talk to your doctor or health care professional before taking any of these medicines: -acetaminophen -aspirin -ibuprofen -ketoprofen -naproxen This list may not describe all possible interactions. Give your health care provider a list of all the medicines, herbs, non-prescription drugs, or dietary supplements you use. Also tell them if you smoke, drink alcohol, or use illegal drugs. Some items may interact with your medicine. What should I watch for while using this medicine? Your condition will be monitored carefully while you are receiving this medicine. You will need important blood work done while you are taking this medicine. This drug may make you feel generally unwell. This is not uncommon, as chemotherapy can affect healthy cells as well as cancer cells. Report  any side effects. Continue your course of treatment even though you feel ill unless your doctor tells you to stop. In some cases, you may be given additional medicines to help with side effects. Follow all directions for their use. Call your doctor or health care professional for advice if you get a fever, chills or sore throat, or other symptoms of a cold or flu. Do not treat yourself. This drug decreases your body's ability to fight infections. Try to avoid being around people who are sick. This medicine may increase your risk to bruise or bleed. Call your doctor or health care professional if you notice any unusual bleeding. This medicine may contain alcohol in the product. You may get drowsy or dizzy. Do not drive, use machinery, or do anything that needs mental alertness until you know how this medicine affects you. Do not stand or sit up quickly, especially if you are an older patient. This reduces the risk of dizzy or fainting spells. Avoid alcoholic drinks. Do not become pregnant while taking this medicine. Women should inform their doctor if they wish to become pregnant or think they might be pregnant. There is a potential for serious side effects to an unborn child. Talk to your health care professional or pharmacist for more information. Do not breast-feed an infant while taking this medicine. What side effects may I notice from receiving this medicine? Side effects that you should report to your doctor or health care professional as soon as possible: -allergic reactions like skin rash, itching or hives, swelling of the face, lips, or tongue -low blood counts - This drug may decrease the number of white blood cells, red blood cells and platelets. You may be at increased risk for infections and bleeding. -signs of infection - fever or chills, cough, sore throat, pain or difficulty passing urine -signs of decreased platelets or bleeding - bruising, pinpoint red spots on the skin, black, tarry  stools, nosebleeds -signs of decreased red blood cells - unusually weak or tired, fainting spells, lightheadedness -breathing problems -fast or irregular heartbeat -low blood pressure -mouth sores -nausea and vomiting -pain, swelling, redness or irritation at the injection site -pain, tingling, numbness in the hands or feet -swelling of the ankle, feet, hands -weight gain Side effects that usually do not require medical attention (report to your prescriber or health care professional if they  continue or are bothersome): -bone pain -complete hair loss including hair on your head, underarms, pubic hair, eyebrows, and eyelashes -diarrhea -excessive tearing -changes in the color of fingernails -loosening of the fingernails -nausea -muscle pain -red flush to skin -sweating -weak or tired This list may not describe all possible side effects. Call your doctor for medical advice about side effects. You may report side effects to FDA at 1-800-FDA-1088. Where should I keep my medicine? This drug is given in a hospital or clinic and will not be stored at home. NOTE: This sheet is a summary. It may not cover all possible information. If you have questions about this medicine, talk to your doctor, pharmacist, or health care provider.    2016, Elsevier/Gold Standard. (2014-01-20 16:04:57)

## 2015-04-17 ENCOUNTER — Encounter: Payer: Self-pay | Admitting: Hematology and Oncology

## 2015-04-17 NOTE — Progress Notes (Signed)
I sent cancer claim form - see 02/12/15 note-it was left for me?---it is dated 02/11/15 to medical records. It is stamped copy

## 2015-04-19 NOTE — Assessment & Plan Note (Signed)
Right lumpectomy 02/03/2015: Invasive ductal carcinoma grade 2, 2.1 cm, with associated DCIS, margins are negative, 0/1 lymph node, ER 0%, PR 0%, HER-2 positive ratio 2.31, T2 N0 stage II a. 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 Adjuvant chemotherapy with TCH 6 cycles followed by Herceptin maintenance for 1 year Started 03/09/2015 2. Followed by radiation therapy -------------------------------------------------------------------------------------------------------------------------------------------------------- Current Treatment: Cycle 3 day 1 TCH ECHO 01/30/15: EF 65-70% Chemotherapy toxicities: 1. Constipation followed by diarrhea  2. Nausea grade 1  3. Fatigue grade 2 4. Insomnia: Takes Ativan periodically Patient takes a medical food supplement for taste and she tells me that it's helping her.  Monitoring closely for chemotherapy toxicities I reviewed her blood work. RTC in 3 weeks for cycle 4

## 2015-04-20 ENCOUNTER — Encounter: Payer: Self-pay | Admitting: *Deleted

## 2015-04-20 ENCOUNTER — Ambulatory Visit (HOSPITAL_BASED_OUTPATIENT_CLINIC_OR_DEPARTMENT_OTHER): Payer: Medicare Other

## 2015-04-20 ENCOUNTER — Other Ambulatory Visit (HOSPITAL_BASED_OUTPATIENT_CLINIC_OR_DEPARTMENT_OTHER): Payer: Medicare Other

## 2015-04-20 ENCOUNTER — Ambulatory Visit (HOSPITAL_BASED_OUTPATIENT_CLINIC_OR_DEPARTMENT_OTHER): Payer: Medicare Other | Admitting: Hematology and Oncology

## 2015-04-20 ENCOUNTER — Telehealth: Payer: Self-pay | Admitting: Hematology and Oncology

## 2015-04-20 ENCOUNTER — Encounter: Payer: Self-pay | Admitting: Hematology and Oncology

## 2015-04-20 VITALS — BP 118/69 | HR 65 | Temp 98.5°F | Resp 18 | Ht 68.25 in | Wt 135.3 lb

## 2015-04-20 DIAGNOSIS — Z171 Estrogen receptor negative status [ER-]: Secondary | ICD-10-CM | POA: Diagnosis not present

## 2015-04-20 DIAGNOSIS — C50311 Malignant neoplasm of lower-inner quadrant of right female breast: Secondary | ICD-10-CM | POA: Diagnosis not present

## 2015-04-20 DIAGNOSIS — Z5189 Encounter for other specified aftercare: Secondary | ICD-10-CM

## 2015-04-20 DIAGNOSIS — G4700 Insomnia, unspecified: Secondary | ICD-10-CM

## 2015-04-20 DIAGNOSIS — Z1501 Genetic susceptibility to malignant neoplasm of breast: Secondary | ICD-10-CM

## 2015-04-20 DIAGNOSIS — Z5112 Encounter for antineoplastic immunotherapy: Secondary | ICD-10-CM

## 2015-04-20 DIAGNOSIS — R5383 Other fatigue: Secondary | ICD-10-CM | POA: Diagnosis not present

## 2015-04-20 DIAGNOSIS — Z1589 Genetic susceptibility to other disease: Secondary | ICD-10-CM

## 2015-04-20 DIAGNOSIS — Z5111 Encounter for antineoplastic chemotherapy: Secondary | ICD-10-CM

## 2015-04-20 DIAGNOSIS — Z1509 Genetic susceptibility to other malignant neoplasm: Secondary | ICD-10-CM

## 2015-04-20 LAB — CBC WITH DIFFERENTIAL/PLATELET
BASO%: 0.3 % (ref 0.0–2.0)
Basophils Absolute: 0 10*3/uL (ref 0.0–0.1)
EOS ABS: 0 10*3/uL (ref 0.0–0.5)
EOS%: 0.3 % (ref 0.0–7.0)
HEMATOCRIT: 34.3 % — AB (ref 34.8–46.6)
HEMOGLOBIN: 11.7 g/dL (ref 11.6–15.9)
LYMPH#: 1 10*3/uL (ref 0.9–3.3)
LYMPH%: 17.3 % (ref 14.0–49.7)
MCH: 32.3 pg (ref 25.1–34.0)
MCHC: 34.1 g/dL (ref 31.5–36.0)
MCV: 94.8 fL (ref 79.5–101.0)
MONO#: 0.4 10*3/uL (ref 0.1–0.9)
MONO%: 6.2 % (ref 0.0–14.0)
NEUT%: 75.9 % (ref 38.4–76.8)
NEUTROS ABS: 4.6 10*3/uL (ref 1.5–6.5)
PLATELETS: 134 10*3/uL — AB (ref 145–400)
RBC: 3.62 10*6/uL — ABNORMAL LOW (ref 3.70–5.45)
RDW: 15.5 % — ABNORMAL HIGH (ref 11.2–14.5)
WBC: 6 10*3/uL (ref 3.9–10.3)

## 2015-04-20 LAB — COMPREHENSIVE METABOLIC PANEL
ALBUMIN: 3.7 g/dL (ref 3.5–5.0)
ALK PHOS: 83 U/L (ref 40–150)
ALT: 34 U/L (ref 0–55)
ANION GAP: 11 meq/L (ref 3–11)
AST: 25 U/L (ref 5–34)
BILIRUBIN TOTAL: 0.65 mg/dL (ref 0.20–1.20)
BUN: 23.7 mg/dL (ref 7.0–26.0)
CALCIUM: 9.3 mg/dL (ref 8.4–10.4)
CO2: 24 meq/L (ref 22–29)
CREATININE: 1 mg/dL (ref 0.6–1.1)
Chloride: 109 mEq/L (ref 98–109)
EGFR: 59 mL/min/{1.73_m2} — AB (ref >90)
Glucose: 90 mg/dl (ref 70–140)
Potassium: 3.5 mEq/L (ref 3.5–5.1)
Sodium: 144 mEq/L (ref 136–145)
TOTAL PROTEIN: 6.4 g/dL (ref 6.4–8.3)

## 2015-04-20 MED ORDER — ACETAMINOPHEN 325 MG PO TABS
ORAL_TABLET | ORAL | Status: AC
  Filled 2015-04-20: qty 2

## 2015-04-20 MED ORDER — DIPHENHYDRAMINE HCL 25 MG PO CAPS
ORAL_CAPSULE | ORAL | Status: AC
  Filled 2015-04-20: qty 2

## 2015-04-20 MED ORDER — PALONOSETRON HCL INJECTION 0.25 MG/5ML
0.2500 mg | Freq: Once | INTRAVENOUS | Status: AC
  Administered 2015-04-20: 0.25 mg via INTRAVENOUS

## 2015-04-20 MED ORDER — SODIUM CHLORIDE 0.9% FLUSH
10.0000 mL | INTRAVENOUS | Status: DC | PRN
  Filled 2015-04-20: qty 10

## 2015-04-20 MED ORDER — SODIUM CHLORIDE 0.9% FLUSH
10.0000 mL | INTRAVENOUS | Status: DC | PRN
  Administered 2015-04-20: 10 mL
  Filled 2015-04-20: qty 10

## 2015-04-20 MED ORDER — DIPHENHYDRAMINE HCL 25 MG PO CAPS
50.0000 mg | ORAL_CAPSULE | Freq: Once | ORAL | Status: AC
  Administered 2015-04-20: 50 mg via ORAL

## 2015-04-20 MED ORDER — HEPARIN SOD (PORK) LOCK FLUSH 100 UNIT/ML IV SOLN
500.0000 [IU] | Freq: Once | INTRAVENOUS | Status: AC | PRN
  Administered 2015-04-20: 500 [IU]
  Filled 2015-04-20: qty 5

## 2015-04-20 MED ORDER — SODIUM CHLORIDE 0.9 % IV SOLN: Freq: Once | INTRAVENOUS | Status: DC

## 2015-04-20 MED ORDER — PEGFILGRASTIM 6 MG/0.6ML ~~LOC~~ PSKT
6.0000 mg | PREFILLED_SYRINGE | Freq: Once | SUBCUTANEOUS | Status: AC
  Administered 2015-04-20: 6 mg via SUBCUTANEOUS
  Filled 2015-04-20: qty 0.6

## 2015-04-20 MED ORDER — ACETAMINOPHEN 500 MG PO TABS
ORAL_TABLET | ORAL | Status: AC
  Filled 2015-04-20: qty 2

## 2015-04-20 MED ORDER — PALONOSETRON HCL INJECTION 0.25 MG/5ML
INTRAVENOUS | Status: AC
  Filled 2015-04-20: qty 5

## 2015-04-20 MED ORDER — HEPARIN SOD (PORK) LOCK FLUSH 100 UNIT/ML IV SOLN
500.0000 [IU] | Freq: Once | INTRAVENOUS | Status: DC | PRN
  Filled 2015-04-20: qty 5

## 2015-04-20 MED ORDER — SODIUM CHLORIDE 0.9 % IV SOLN
Freq: Once | INTRAVENOUS | Status: AC
  Administered 2015-04-20: 10:00:00 via INTRAVENOUS

## 2015-04-20 MED ORDER — SODIUM CHLORIDE 0.9 % IV SOLN
75.0000 mg/m2 | Freq: Once | INTRAVENOUS | Status: AC
  Administered 2015-04-20: 130 mg via INTRAVENOUS
  Filled 2015-04-20: qty 13

## 2015-04-20 MED ORDER — TRASTUZUMAB CHEMO INJECTION 440 MG
6.0000 mg/kg | Freq: Once | INTRAVENOUS | Status: AC
  Administered 2015-04-20: 378 mg via INTRAVENOUS
  Filled 2015-04-20: qty 18

## 2015-04-20 MED ORDER — ACETAMINOPHEN 325 MG PO TABS
650.0000 mg | ORAL_TABLET | Freq: Once | ORAL | Status: AC
  Administered 2015-04-20: 650 mg via ORAL

## 2015-04-20 MED ORDER — SODIUM CHLORIDE 0.9 % IV SOLN
445.2000 mg | Freq: Once | INTRAVENOUS | Status: AC
  Administered 2015-04-20: 450 mg via INTRAVENOUS
  Filled 2015-04-20: qty 45

## 2015-04-20 MED ORDER — DEXTROSE 5 % IV SOLN: 75.0000 mg/m2 | Freq: Once | INTRAVENOUS | Status: DC

## 2015-04-20 MED ORDER — DEXAMETHASONE SODIUM PHOSPHATE 100 MG/10ML IJ SOLN
10.0000 mg | Freq: Once | INTRAMUSCULAR | Status: AC
  Administered 2015-04-20: 10 mg via INTRAVENOUS
  Filled 2015-04-20: qty 1

## 2015-04-20 NOTE — Progress Notes (Signed)
Patient Care Team: [name], MD as PCP - General (Internal Medicine) [name], MD as Consulting Physician (General Surgery) [name], MD as Consulting Physician (Hematology and Oncology)  SUMMARY OF ONCOLOGIC HISTORY:   Breast cancer of lower-inner quadrant of right female breast ([place])   12/02/2014 Mammogram Right breast irregular mass 1 cm size with calcifications   12/09/2014 Initial Diagnosis Right breast biopsy 3:30 position: Invasive ductal carcinoma grade 3, ER 0%, PR 0%, HER-2 negative ratio 1.5, Ki-67 90%, T1C N0 stage IA clinical stage   12/16/2014 Procedure genetic testing showed NBN pathologic mutation   02/03/2015 Surgery Right lumpectomy: Invasive ductal carcinoma grade 2, 2.1 cm, with associated DCIS, margins are negative, 0/1 lymph node, ER 0%, PR 0%, HER-2 positive ratio 2.31, T2 N0 stage II a   03/09/2015 -  Chemotherapy Adjuvant chemotherapy with [place] 6 followed by Herceptin maintenance for 1 year    CHIEF COMPLIANT: cycle 3 TCH  INTERVAL HISTORY: Shawna [name] is a 75 year old with above-mentioned right breast cancer currently on adjuvant chemotherapy with TCH. Today cycle 3. She has tolerated second cycle of chemotherapy extremely well. She did not have any nausea or vomiting. She lost her hair.  REVIEW OF SYSTEMS:   Constitutional: Denies fevers, chills or abnormal weight loss Eyes: Denies blurriness of vision Ears, nose, mouth, throat, and face: Denies mucositis or sore throat Respiratory: Denies cough, dyspnea or wheezes Cardiovascular: Denies palpitation, chest discomfort Gastrointestinal:  Denies nausea, heartburn or change in bowel habits Skin: Denies abnormal skin rashes Lymphatics: Denies new lymphadenopathy or easy bruising Neurological:Denies numbness, tingling or new weaknesses Behavioral/Psych: Mood is stable, no new changes  Extremities: No lower extremity edema Breast:  denies any pain or lumps or nodules in either breasts All other  systems were reviewed with the patient and are negative.  I have reviewed the past medical history, past surgical history, social history and family history with the patient and they are unchanged from previous note.  ALLERGIES:  has No Known Allergies.  MEDICATIONS:  Current Outpatient Prescriptions  Medication Sig Dispense Refill  . ALPRAZolam (XANAX) 0.25 MG tablet TAKE 1-2 TABLETS BY MOUTH 2 TIMES DAILY AS NEEDED  0  . Calcium Carbonate-Vitamin D (CALCIUM 600 + D PO) Take 1 tablet by mouth daily.     . Cholecalciferol (VITAMIN D) 2000 UNITS tablet Take 2,000 Units by mouth daily.      . CRESTOR 20 MG tablet Take 1 tablet by mouth daily.  0  . dexamethasone (DECADRON) 4 MG tablet Take 1 tablet (4 mg total) by mouth daily. Start the day before Taxotere. Then again the day after chemo for 2 days. 30 tablet 1  . HYDROcodone-acetaminophen (NORCO/VICODIN) 5-325 MG tablet Take 1-2 tablets by mouth every 6 (six) hours as needed. 30 tablet 0  . lidocaine-prilocaine (EMLA) cream Apply to affected area once 30 g 3  . LORazepam (ATIVAN) 0.5 MG tablet Take 1 tablet (0.5 mg total) by mouth at bedtime. 30 tablet 0  . ondansetron (ZOFRAN) 8 MG tablet Take 1 tablet (8 mg total) by mouth 2 (two) times daily as needed for refractory nausea / vomiting. Start on day 3 after chemo. 30 tablet 1  . prochlorperazine (COMPAZINE) 10 MG tablet Take 1 tablet (10 mg total) by mouth every 6 (six) hours as needed (Nausea or vomiting). 30 tablet 1  . solifenacin (VESICARE) 5 MG tablet Take 10 mg by mouth daily.    Marland Kitchen telmisartan-hydrochlorothiazide (MICARDIS HCT) 80-12.5 MG per tablet Take  1 tablet by mouth daily.      . zoledronic acid (RECLAST) 5 MG/100ML SOLN Inject 5 mg into the vein once. annually      No current facility-administered medications for this visit.    PHYSICAL EXAMINATION: ECOG PERFORMANCE STATUS: 1 - Symptomatic but completely ambulatory  Filed Vitals:   04/20/15 0839  BP: 118/69  Pulse: 65    Temp: 98.5 F (36.9 C)  Resp: 18   Filed Weights   04/20/15 0839  Weight: 135 lb 4.8 oz (61.372 kg)    GENERAL:alert, no distress and comfortable SKIN: skin color, texture, turgor are normal, no rashes or significant lesions EYES: normal, Conjunctiva are pink and non-injected, sclera clear OROPHARYNX:no exudate, no erythema and lips, buccal mucosa, and tongue normal  NECK: supple, thyroid normal size, non-tender, without nodularity LYMPH:  no palpable lymphadenopathy in the cervical, axillary or inguinal LUNGS: clear to auscultation and percussion with normal breathing effort HEART: regular rate & rhythm and no murmurs and no lower extremity edema ABDOMEN:abdomen soft, non-tender and normal bowel sounds MUSCULOSKELETAL:no cyanosis of digits and no clubbing  NEURO: alert & oriented x 3 with fluent speech, no focal motor/sensory deficits EXTREMITIES: No lower extremity edema BREAST: No palpable masses or nodules in either right or left breasts. No palpable axillary supraclavicular or infraclavicular adenopathy no breast tenderness or nipple discharge. (exam performed in the presence of a chaperone)  LABORATORY DATA:  I have reviewed the data as listed   Chemistry      Component Value Date/Time   NA 144 04/20/2015 0820   NA 142 01/29/2015 1545   K 3.5 04/20/2015 0820   K 4.5 01/29/2015 1545   CL 108 01/29/2015 1545   CO2 24 04/20/2015 0820   CO2 26 01/29/2015 1545   BUN 23.7 04/20/2015 0820   BUN 29* 01/29/2015 1545   CREATININE 1.0 04/20/2015 0820   CREATININE 1.12* 01/29/2015 1545      Component Value Date/Time   CALCIUM 9.3 04/20/2015 0820   CALCIUM 9.6 01/29/2015 1545   ALKPHOS 83 04/20/2015 0820   AST 25 04/20/2015 0820   ALT 34 04/20/2015 0820   BILITOT 0.65 04/20/2015 0820       Lab Results  Component Value Date   WBC 6.0 04/20/2015   HGB 11.7 04/20/2015   HCT 34.3* 04/20/2015   MCV 94.8 04/20/2015   PLT 134* 04/20/2015   NEUTROABS 4.6 04/20/2015      ASSESSMENT & PLAN:  Breast cancer of lower-inner quadrant of right female breast ([place]) Right lumpectomy 02/03/2015: Invasive ductal carcinoma grade 2, 2.1 cm, with associated DCIS, margins are negative, 0/1 lymph node, ER 0%, PR 0%, HER-2 positive ratio 2.31, T2 N0 stage II a. 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 Adjuvant chemotherapy with TCH 6 cycles followed by Herceptin maintenance for 1 year Started 03/09/2015 2. Followed by radiation therapy -------------------------------------------------------------------------------------------------------------------------------------------------------- Current Treatment: Cycle 3 day 1 TCH ECHO 01/30/15: EF 65-70% Chemotherapy toxicities: 1. Constipation followed by diarrhea  2. Nausea grade 1  3. Fatigue grade 2 4. Insomnia: Takes Ativan periodically Patient takes a medical food supplement for taste and she tells me that it's helping her.  Monitoring closely for chemotherapy toxicities I reviewed her blood work. RTC in 3 weeks for cycle 4   No orders of the defined types  were placed in this encounter.   The patient has a good understanding of the overall plan. she agrees with it. she will call with any problems that may develop before the next visit here.   Rulon Eisenmenger, MD 04/20/2015

## 2015-04-20 NOTE — Telephone Encounter (Signed)
appt made and avs printed °

## 2015-04-20 NOTE — Progress Notes (Signed)
Unable to get in to exam room prior to MD.  No assessment performed.  

## 2015-04-20 NOTE — Patient Instructions (Signed)
[facility] Discharge Instructions for Patients Receiving Chemotherapy  Today you received the following chemotherapy agents:  Taxotere, Carboplatin, and Herceptin.  To help prevent nausea and vomiting after your treatment, we encourage you to take your nausea medication as prescribed.   If you develop nausea and vomiting that is not controlled by your nausea medication, call the clinic.   BELOW ARE SYMPTOMS THAT SHOULD BE REPORTED IMMEDIATELY:  *FEVER GREATER THAN 100.5 F  *CHILLS WITH OR WITHOUT FEVER  NAUSEA AND VOMITING THAT IS NOT CONTROLLED WITH YOUR NAUSEA MEDICATION  *UNUSUAL SHORTNESS OF BREATH  *UNUSUAL BRUISING OR BLEEDING  TENDERNESS IN MOUTH AND THROAT WITH OR WITHOUT PRESENCE OF ULCERS  *URINARY PROBLEMS  *BOWEL PROBLEMS  UNUSUAL RASH Items with * indicate a potential emergency and should be followed up as soon as possible.  Feel free to call the clinic you have any questions or concerns. The clinic phone number is [phone].  Please show the [facility] at check-in to the Emergency Department and triage nurse.

## 2015-04-30 ENCOUNTER — Ambulatory Visit (HOSPITAL_COMMUNITY)
Admission: RE | Admit: 2015-04-30 | Discharge: 2015-04-30 | Disposition: A | Discharge status: Home / Self Care | Payer: Medicare Other | Source: Ambulatory Visit | Attending: Internal Medicine | Admitting: Internal Medicine

## 2015-04-30 ENCOUNTER — Ambulatory Visit (HOSPITAL_BASED_OUTPATIENT_CLINIC_OR_DEPARTMENT_OTHER)
Admission: RE | Admit: 2015-04-30 | Discharge: 2015-04-30 | Disposition: A | Discharge status: Home / Self Care | Payer: Medicare Other | Source: Ambulatory Visit | Attending: Internal Medicine | Admitting: Internal Medicine

## 2015-04-30 ENCOUNTER — Other Ambulatory Visit (HOSPITAL_COMMUNITY): Payer: Self-pay | Admitting: Internal Medicine

## 2015-04-30 VITALS — BP 114/64 | HR 89 | Wt 130.5 lb

## 2015-04-30 DIAGNOSIS — C50311 Malignant neoplasm of lower-inner quadrant of right female breast: Secondary | ICD-10-CM

## 2015-04-30 DIAGNOSIS — I5189 Other ill-defined heart diseases: Secondary | ICD-10-CM | POA: Diagnosis not present

## 2015-04-30 LAB — ECHOCARDIOGRAM LIMITED: Weight: 2088 oz

## 2015-04-30 NOTE — Patient Instructions (Signed)
We will contact you in 3 months to schedule your next appointment and echocardiogram  

## 2015-04-30 NOTE — Progress Notes (Signed)
  Echocardiogram 2D Echocardiogram has been performed.  Shawna [name] 04/30/2015, 10:50 AM

## 2015-04-30 NOTE — Progress Notes (Signed)
Advanced Heart Failure Medication Review by a Pharmacist  Does the patient  feel that his/her medications are working for him/her?  yes  Has the patient been experiencing any side effects to the medications prescribed?  no  Does the patient measure his/her own blood pressure or blood glucose at home?  no   Does the patient have any problems obtaining medications due to transportation or finances?   no  Understanding of regimen: good Understanding of indications: good Potential of compliance: good Patient understands to avoid NSAIDs. Patient understands to avoid decongestants.  Issues to address at subsequent visits: None   Pharmacist comments:  Ms. Barie is a pleasant 75 yo F presenting with a current medication list. She reports excellent compliance with her regimen and does state that she has needed more alprazolam lately since her mother recently passed. No other medication-related questions or concerns for me at this time.   Ruta Hinds. Velva Harman, PharmD, BCPS, CPP Clinical Pharmacist Pager: [phone] Phone: [phone] 04/30/2015 10:50 AM      Time with patient: 8 minutes Preparation and documentation time: 2 minutes Total time: 10 minutes

## 2015-04-30 NOTE — Progress Notes (Signed)
Patient ID: Shawna [name], female   DOB: 1940/05/05, 75 y.o.   MRN: [id number]    Advanced Heart Failure CardioOncology Consult Note   Referring Physician: Dr Lindi Adie Oncologist: Dr. Lindi Adie Primary Cardiologist: New  HPI:  Shawna [name] is a 75 y.o. female with right breast cancer as below. Referred by Dr. Lindi Adie for enrollment into cardio-oncology clinic  Breast Cancer Profile:  Right lumpectomy 02/03/2015: Invasive ductal carcinoma grade 2, 2.1 cm, with associated DCIS, margins are negative, 0/1 lymph node, ER 0%, PR 0%, HER-2 positive ratio 2.31, T2 N0 stage II a. (Right breast biopsy 3:30 position 12/09/2014: Invasive ductal carcinoma grade 3, ER 0%, PR 0%, HER-2 negative ratio 1.5, Ki-67 90%, 1 cm irregular mass T1C N0 stage IA clinical stage) Genetic counseling revealed NBN mutation Treatment plan includes TCH x 6 cycles followed by herceptin maintenance for 1 year.   She presents today to establish with cardio-oncology clinic.  She recently completed Cycle 3/6 of [place].   Has been slightly down recently with the passing of her mother.  Has tolerated chemo relatively well. She has had alteration of her taste and decreased appetite that last about a week and a half after each treatment. Denies SOB on flat ground, does have slight DOE on an incline. None with ADLs. No orthopnea, PND, or bendopnea. Denies any edema. No palpitations or CP.   Echo 01/30/15 65-70%, LS' 11.5, GLS -21.4% Echo 04/30/15 60-65%, LS' 11.4, GLS -20.3%  Review of Systems: [y] = yes, [ ] = no   General: Weight gain [ ]; Weight loss [ ]; Anorexia [ ]; Fatigue [ y]; Fever [ ]; Chills [ ]; Weakness [ ]  Cardiac: Chest pain/pressure [ ]; Resting SOB [ ]; Exertional SOB [ ]; Orthopnea [ ]; Pedal Edema [ ]; Palpitations [ ]; Syncope [ ]; Presyncope [ ]; Paroxysmal nocturnal dyspnea[ ]  Pulmonary: Cough [ ]; Wheezing[ ]; Hemoptysis[ ]; Sputum [ ]; Snoring [ ]  GI: Vomiting[ ]; Dysphagia[ ]; Melena[ ]; Hematochezia [ ];  Heartburn[ ]; Abdominal pain [ ]; Constipation [ ]; Diarrhea [ ]; BRBPR [ ]  GU: Hematuria[ ]; Dysuria [ ]; Nocturia[ ]  Vascular: Pain in legs with walking [ ]; Pain in feet with lying flat [ ]; Non-healing sores [ ]; Stroke [ ]; TIA [ ]; Slurred speech [ ];  Neuro: Headaches[ ]; Vertigo[ ]; Seizures[ ]; Paresthesias[ ];Blurred vision [ ]; Diplopia [ ]; Vision changes [ ]  Ortho/Skin: Arthritis Blue.Reese ]; Joint pain [ ]; Muscle pain [ ]; Joint swelling [ ]; Back Pain [ ]; Rash [ ]  Psych: Depression[y ]; Anxiety[ ]  Heme: Bleeding problems [ ]; Clotting disorders [ ]; Anemia [ ]  Endocrine: Diabetes [ ]; Thyroid dysfunction[ ]   Past Medical History  Diagnosis Date  . Osteoporosis   . Hyperlipidemia   . Hypertension   . Kidney stone on left side   . Family history of breast cancer   . Family history of colon cancer   . Skin cancer of nose   . Monoallelic mutation of NBN gene     Current Outpatient Prescriptions  Medication Sig Dispense Refill  . ALPRAZolam (XANAX) 0.25 MG tablet Take 0.25-0.5 mg by mouth 2 (two) times daily as needed for anxiety.    . Calcium Carbonate-Vitamin D (CALCIUM 600 + D PO) Take 1 tablet by mouth daily.     . Cholecalciferol (VITAMIN D) 2000 UNITS tablet Take 2,000 Units by mouth daily.      Marland Kitchen  CRESTOR 20 MG tablet Take 1 tablet by mouth daily.  0  . dexamethasone (DECADRON) 4 MG tablet Take 1 tablet (4 mg total) by mouth daily. Start the day before Taxotere. Then again the day after chemo for 2 days. 30 tablet 1  . lidocaine-prilocaine (EMLA) cream Apply to affected area once 30 g 3  . LORazepam (ATIVAN) 0.5 MG tablet Take 1 tablet (0.5 mg total) by mouth at bedtime. 30 tablet 0  . ondansetron (ZOFRAN) 8 MG tablet Take 1 tablet (8 mg total) by mouth 2 (two) times daily as needed for refractory nausea / vomiting. Start on day 3 after chemo. 30 tablet 1  . prochlorperazine (COMPAZINE) 10 MG tablet Take 1 tablet (10 mg total) by mouth every 6 (six) hours as needed  (Nausea or vomiting). 30 tablet 1  . solifenacin (VESICARE) 5 MG tablet Take 5 mg by mouth every other day.     . telmisartan-hydrochlorothiazide (MICARDIS HCT) 80-12.5 MG per tablet Take 1 tablet by mouth daily.      . zoledronic acid (RECLAST) 5 MG/100ML SOLN Inject 5 mg into the vein once. annually      No current facility-administered medications for this encounter.    No Known Allergies    Social History   Social History  . Marital Status: Widowed    Spouse Name: N/A  . Number of Children: 2  . Years of Education: N/A   Occupational History  . Not on file.   Social History Main Topics  . Smoking status: Never Smoker   . Smokeless tobacco: Never Used  . Alcohol Use: 4.2 oz/week    7 Glasses of wine per week     Comment: 6-7 glasses of wine a week  . Drug Use: No  . Sexual Activity: No   Other Topics Concern  . Not on file   Social History Narrative      Family History  Problem Relation Age of Onset  . Colon cancer Sister 44  . Breast cancer Mother 40  . Hypertension Mother   . Osteoporosis Mother   . Heart disease Father     CABG  . Dementia Father   . Throat cancer Brother 20  . Breast cancer Sister 59  . Lymphoma Sister 43  . Melanoma Sister   . Skin cancer Brother   . Skin cancer Daughter     Danley Danker Vitals:   04/30/15 1040  BP: 114/64  Pulse: 89  Weight: 130 lb 8 oz (59.194 kg)  SpO2: 99%    PHYSICAL EXAM: General:  Well appearing. No respiratory difficulty HEENT: normal Neck: supple. no JVD. Carotids 2+ bilat; no bruits. No lymphadenopathy or thyromegaly appreciated. Cor: PMI nondisplaced. Regular rate & rhythm. No rubs, gallops or murmurs. Lungs: clear Abdomen: soft, nontender, nondistended. No hepatosplenomegaly. No bruits or masses. Good bowel sounds. Extremities: no cyanosis, clubbing, rash, edema Neuro: alert & oriented x 3, cranial nerves grossly intact. moves all 4 extremities w/o difficulty. Affect pleasant.  ASSESSMENT &  PLAN:   1. Right Breast Cancer - HER 2 -neu +      -Treatment plan includes TCH x 6 cycles followed by herceptin maintenance for 1 year. (now s/p 3/6 cycles TCH)      - Echo ok today. Continue Herceptin  Beryle Beams" [employer], [place] 04/30/2015 11:02 AM   Explained incidence of Herceptin cardiotoxicity and role of Cardio-oncology clinic at length. Echo images reviewed personally. All parameters stable. Reviewed signs and symptoms of HF to look  for. Continue Herceptin. Follow-up with echo in 3 months.  Bensimhon, Daniel,MD 5:30 PM

## 2015-05-05 DIAGNOSIS — I10 Essential (primary) hypertension: Secondary | ICD-10-CM | POA: Diagnosis not present

## 2015-05-05 DIAGNOSIS — E784 Other hyperlipidemia: Secondary | ICD-10-CM | POA: Diagnosis not present

## 2015-05-05 DIAGNOSIS — Z681 Body mass index (BMI) 19 or less, adult: Secondary | ICD-10-CM | POA: Diagnosis not present

## 2015-05-05 DIAGNOSIS — C50311 Malignant neoplasm of lower-inner quadrant of right female breast: Secondary | ICD-10-CM | POA: Diagnosis not present

## 2015-05-05 DIAGNOSIS — N3281 Overactive bladder: Secondary | ICD-10-CM | POA: Diagnosis not present

## 2015-05-06 ENCOUNTER — Telehealth: Payer: Self-pay

## 2015-05-06 NOTE — Telephone Encounter (Signed)
I returned pt call and let her know it is okay for her to have Reclast infusion (administered annually by her PCP).  Pt voiced understanding.

## 2015-05-11 ENCOUNTER — Encounter: Payer: Self-pay | Admitting: Hematology and Oncology

## 2015-05-11 ENCOUNTER — Other Ambulatory Visit (HOSPITAL_BASED_OUTPATIENT_CLINIC_OR_DEPARTMENT_OTHER): Payer: Medicare Other

## 2015-05-11 ENCOUNTER — Encounter: Payer: Self-pay | Admitting: *Deleted

## 2015-05-11 ENCOUNTER — Telehealth: Payer: Self-pay | Admitting: Hematology and Oncology

## 2015-05-11 ENCOUNTER — Ambulatory Visit (HOSPITAL_BASED_OUTPATIENT_CLINIC_OR_DEPARTMENT_OTHER): Payer: Medicare Other | Admitting: Hematology and Oncology

## 2015-05-11 ENCOUNTER — Ambulatory Visit (HOSPITAL_BASED_OUTPATIENT_CLINIC_OR_DEPARTMENT_OTHER): Payer: Medicare Other

## 2015-05-11 VITALS — BP 119/66 | HR 74 | Temp 98.4°F | Resp 18 | Ht 68.25 in | Wt 135.2 lb

## 2015-05-11 DIAGNOSIS — C50311 Malignant neoplasm of lower-inner quadrant of right female breast: Secondary | ICD-10-CM

## 2015-05-11 DIAGNOSIS — Z5111 Encounter for antineoplastic chemotherapy: Secondary | ICD-10-CM

## 2015-05-11 DIAGNOSIS — Z5112 Encounter for antineoplastic immunotherapy: Secondary | ICD-10-CM | POA: Diagnosis not present

## 2015-05-11 DIAGNOSIS — Z5189 Encounter for other specified aftercare: Secondary | ICD-10-CM

## 2015-05-11 DIAGNOSIS — D6481 Anemia due to antineoplastic chemotherapy: Secondary | ICD-10-CM | POA: Diagnosis not present

## 2015-05-11 DIAGNOSIS — Z171 Estrogen receptor negative status [ER-]: Secondary | ICD-10-CM

## 2015-05-11 LAB — CBC WITH DIFFERENTIAL/PLATELET
BASO%: 1 % (ref 0.0–2.0)
Basophils Absolute: 0.1 10*3/uL (ref 0.0–0.1)
EOS%: 0.2 % (ref 0.0–7.0)
Eosinophils Absolute: 0 10*3/uL (ref 0.0–0.5)
HCT: 29.9 % — ABNORMAL LOW (ref 34.8–46.6)
HEMOGLOBIN: 10 g/dL — AB (ref 11.6–15.9)
LYMPH#: 0.9 10*3/uL (ref 0.9–3.3)
LYMPH%: 16 % (ref 14.0–49.7)
MCH: 32.8 pg (ref 25.1–34.0)
MCHC: 33.4 g/dL (ref 31.5–36.0)
MCV: 98.1 fL (ref 79.5–101.0)
MONO#: 0.3 10*3/uL (ref 0.1–0.9)
MONO%: 4.9 % (ref 0.0–14.0)
NEUT%: 77.9 % — ABNORMAL HIGH (ref 38.4–76.8)
NEUTROS ABS: 4.2 10*3/uL (ref 1.5–6.5)
Platelets: 155 10*3/uL (ref 145–400)
RBC: 3.04 10*6/uL — ABNORMAL LOW (ref 3.70–5.45)
RDW: 18.8 % — AB (ref 11.2–14.5)
WBC: 5.3 10*3/uL (ref 3.9–10.3)

## 2015-05-11 LAB — COMPREHENSIVE METABOLIC PANEL
ALBUMIN: 3.5 g/dL (ref 3.5–5.0)
ALK PHOS: 90 U/L (ref 40–150)
ALT: 35 U/L (ref 0–55)
AST: 35 U/L — AB (ref 5–34)
Anion Gap: 9 mEq/L (ref 3–11)
BILIRUBIN TOTAL: 0.54 mg/dL (ref 0.20–1.20)
BUN: 22.8 mg/dL (ref 7.0–26.0)
CO2: 21 meq/L — AB (ref 22–29)
Calcium: 8.9 mg/dL (ref 8.4–10.4)
Chloride: 112 mEq/L — ABNORMAL HIGH (ref 98–109)
Creatinine: 0.8 mg/dL (ref 0.6–1.1)
EGFR: 69 mL/min/{1.73_m2} — ABNORMAL LOW (ref >90)
GLUCOSE: 99 mg/dL (ref 70–140)
POTASSIUM: 4 meq/L (ref 3.5–5.1)
SODIUM: 142 meq/L (ref 136–145)
TOTAL PROTEIN: 5.7 g/dL — AB (ref 6.4–8.3)

## 2015-05-11 MED ORDER — SODIUM CHLORIDE 0.9 % IV SOLN
10.0000 mg | Freq: Once | INTRAVENOUS | Status: AC
  Administered 2015-05-11: 10 mg via INTRAVENOUS
  Filled 2015-05-11: qty 1

## 2015-05-11 MED ORDER — TRASTUZUMAB CHEMO INJECTION 440 MG
6.0000 mg/kg | Freq: Once | INTRAVENOUS | Status: AC
  Administered 2015-05-11: 378 mg via INTRAVENOUS
  Filled 2015-05-11: qty 18

## 2015-05-11 MED ORDER — PALONOSETRON HCL INJECTION 0.25 MG/5ML
INTRAVENOUS | Status: AC
  Filled 2015-05-11: qty 5

## 2015-05-11 MED ORDER — TELMISARTAN 80 MG PO TABS: 80.0000 mg | ORAL_TABLET | Freq: Every day | ORAL | Status: DC

## 2015-05-11 MED ORDER — SODIUM CHLORIDE 0.9 % IV SOLN
Freq: Once | INTRAVENOUS | Status: AC
  Administered 2015-05-11: 09:00:00 via INTRAVENOUS

## 2015-05-11 MED ORDER — PEGFILGRASTIM 6 MG/0.6ML ~~LOC~~ PSKT
6.0000 mg | PREFILLED_SYRINGE | Freq: Once | SUBCUTANEOUS | Status: AC
Start: 2015-05-11 — End: 2015-05-11
  Administered 2015-05-11: 6 mg via SUBCUTANEOUS
  Filled 2015-05-11: qty 0.6

## 2015-05-11 MED ORDER — SODIUM CHLORIDE 0.9% FLUSH
10.0000 mL | INTRAVENOUS | Status: DC | PRN
  Administered 2015-05-11: 10 mL
  Filled 2015-05-11: qty 10

## 2015-05-11 MED ORDER — SODIUM CHLORIDE 0.9 % IV SOLN
445.2000 mg | Freq: Once | INTRAVENOUS | Status: AC
  Administered 2015-05-11: 450 mg via INTRAVENOUS
  Filled 2015-05-11: qty 45

## 2015-05-11 MED ORDER — ACETAMINOPHEN 325 MG PO TABS
650.0000 mg | ORAL_TABLET | Freq: Once | ORAL | Status: AC
  Administered 2015-05-11: 650 mg via ORAL

## 2015-05-11 MED ORDER — TOLTERODINE TARTRATE ER 4 MG PO CP24: 4.0000 mg | ORAL_CAPSULE | Freq: Every day | ORAL | Status: DC

## 2015-05-11 MED ORDER — PALONOSETRON HCL INJECTION 0.25 MG/5ML
0.2500 mg | Freq: Once | INTRAVENOUS | Status: AC
  Administered 2015-05-11: 0.25 mg via INTRAVENOUS

## 2015-05-11 MED ORDER — DIPHENHYDRAMINE HCL 25 MG PO CAPS
ORAL_CAPSULE | ORAL | Status: AC
  Filled 2015-05-11: qty 2

## 2015-05-11 MED ORDER — DOCETAXEL CHEMO INJECTION 160 MG/16ML
75.0000 mg/m2 | Freq: Once | INTRAVENOUS | Status: AC
  Administered 2015-05-11: 130 mg via INTRAVENOUS
  Filled 2015-05-11: qty 13

## 2015-05-11 MED ORDER — HEPARIN SOD (PORK) LOCK FLUSH 100 UNIT/ML IV SOLN
500.0000 [IU] | Freq: Once | INTRAVENOUS | Status: AC | PRN
  Administered 2015-05-11: 500 [IU]
  Filled 2015-05-11: qty 5

## 2015-05-11 MED ORDER — LORAZEPAM 0.5 MG PO TABS: 0.5000 mg | ORAL_TABLET | Freq: Every day | ORAL | Status: DC

## 2015-05-11 MED ORDER — DIPHENHYDRAMINE HCL 25 MG PO CAPS
50.0000 mg | ORAL_CAPSULE | Freq: Once | ORAL | Status: AC
  Administered 2015-05-11: 50 mg via ORAL

## 2015-05-11 MED ORDER — ACETAMINOPHEN 325 MG PO TABS
ORAL_TABLET | ORAL | Status: AC
  Filled 2015-05-11: qty 2

## 2015-05-11 NOTE — Patient Instructions (Signed)
[facility] Discharge Instructions for Patients Receiving Chemotherapy  Today you received the following chemotherapy agents: Taxotere, Carboplatin, Herceptin.  To help prevent nausea and vomiting after your treatment, we encourage you to take your nausea medication: Compazine 10 mg every 6 hours as needed, Zofran 8 mg every 12 hours as needed.   If you develop nausea and vomiting that is not controlled by your nausea medication, call the clinic.   BELOW ARE SYMPTOMS THAT SHOULD BE REPORTED IMMEDIATELY:  *FEVER GREATER THAN 100.5 F  *CHILLS WITH OR WITHOUT FEVER  NAUSEA AND VOMITING THAT IS NOT CONTROLLED WITH YOUR NAUSEA MEDICATION  *UNUSUAL SHORTNESS OF BREATH  *UNUSUAL BRUISING OR BLEEDING  TENDERNESS IN MOUTH AND THROAT WITH OR WITHOUT PRESENCE OF ULCERS  *URINARY PROBLEMS  *BOWEL PROBLEMS  UNUSUAL RASH Items with * indicate a potential emergency and should be followed up as soon as possible.  Feel free to call the clinic you have any questions or concerns. The clinic phone number is [phone].  Please show the [facility] [place] at check-in to the Emergency Department and triage nurse.

## 2015-05-11 NOTE — Telephone Encounter (Signed)
appt made and avs will print in treatment room °

## 2015-05-11 NOTE — Addendum Note (Signed)
Addended by: Prentiss Bells on: 05/11/2015 09:27 AM   Modules accepted: Medications

## 2015-05-11 NOTE — Progress Notes (Signed)
left in pod -dr has signed and I faxed to  [phone] and mailed copy to patient and sent to medical records

## 2015-05-11 NOTE — Progress Notes (Signed)
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 Today is cycle 4 of treatment. She appears to be tolerating the chemotherapy fairly well. She does have tiredness, decreased taste and appetite for the first one half weeks after chemotherapy but all of these symptoms improve and then she gains her energy back. Today she feels reasonably well. Denies any nausea or vomiting. Denies any diarrhea or constipation.  REVIEW OF SYSTEMS:   Constitutional: Denies fevers, chills or abnormal weight loss Eyes: Denies blurriness of vision Ears, nose, mouth, throat, and face: Denies mucositis or sore throat Respiratory: Denies cough, dyspnea or wheezes Cardiovascular: Denies palpitation, chest discomfort Gastrointestinal:  Denies nausea, heartburn or change in bowel habits Skin: Denies abnormal skin rashes Lymphatics: Denies new lymphadenopathy or  easy bruising Neurological:Denies numbness, tingling or new weaknesses Behavioral/Psych: Mood is stable, no new changes  Extremities: No lower extremity edema Breast:  denies any pain or lumps or nodules in either breasts All other systems were reviewed with the patient and are negative.  I have reviewed the past medical history, past surgical history, social history and family history with the patient and they are unchanged from previous note.  ALLERGIES:  has No Known Allergies.  MEDICATIONS:  Current Outpatient Prescriptions  Medication Sig Dispense Refill  . ALPRAZolam (XANAX) 0.25 MG tablet Take 0.25-0.5 mg by mouth 2 (two) times daily as needed for anxiety.    . Calcium Carbonate-Vitamin D (CALCIUM 600 + D PO) Take 1 tablet by mouth daily.     . Cholecalciferol (VITAMIN D) 2000 UNITS tablet Take 2,000 Units by mouth daily.      . CRESTOR 20 MG tablet Take 1 tablet by mouth daily.  0  . dexamethasone (DECADRON) 4 MG tablet Take 1 tablet (4 mg total) by mouth daily. Start the day before Taxotere. Then again the day after chemo for 2 days. 30 tablet 1  . lidocaine-prilocaine (EMLA) cream Apply to affected area once 30 g 3  . LORazepam (ATIVAN) 0.5 MG tablet Take 1 tablet (0.5 mg total) by mouth at bedtime. 30 tablet 0  . ondansetron (ZOFRAN) 8 MG tablet Take 1 tablet (8 mg total) by mouth 2 (two) times daily as needed for refractory nausea / vomiting. Start on day 3 after chemo. 30 tablet 1  . prochlorperazine (COMPAZINE) 10 MG tablet Take 1 tablet (10 mg total) by mouth every 6 (six) hours as needed (Nausea or vomiting). 30 tablet 1  . telmisartan (MICARDIS) 80 MG  tablet Take 1 tablet (80 mg total) by mouth daily.    Marland Kitchen tolterodine (DETROL LA) 4 MG 24 hr capsule Take 1 capsule (4 mg total) by mouth daily.    . zoledronic acid (RECLAST) 5 MG/100ML SOLN Inject 5 mg into the vein once. annually      No current facility-administered medications for this visit.    PHYSICAL  EXAMINATION: ECOG PERFORMANCE STATUS: 1 - Symptomatic but completely ambulatory  Filed Vitals:   05/11/15 0846  BP: 119/66  Pulse: 74  Temp: 98.4 F (36.9 C)  Resp: 18   Filed Weights   05/11/15 0846  Weight: 135 lb 3.2 oz (61.326 kg)    GENERAL:alert, no distress and comfortable SKIN: skin color, texture, turgor are normal, no rashes or significant lesions EYES: normal, Conjunctiva are pink and non-injected, sclera clear OROPHARYNX:no exudate, no erythema and lips, buccal mucosa, and tongue normal  NECK: supple, thyroid normal size, non-tender, without nodularity LYMPH:  no palpable lymphadenopathy in the cervical, axillary or inguinal LUNGS: clear to auscultation and percussion with normal breathing effort HEART: regular rate & rhythm and no murmurs and no lower extremity edema ABDOMEN:abdomen soft, non-tender and normal bowel sounds MUSCULOSKELETAL:no cyanosis of digits and no clubbing  NEURO: alert & oriented x 3 with fluent speech, no focal motor/sensory deficits EXTREMITIES: No lower extremity edema  LABORATORY DATA:  I have reviewed the data as listed   Chemistry      Component Value Date/Time   NA 142 05/11/2015 0821   NA 142 01/29/2015 1545   K 4.0 05/11/2015 0821   K 4.5 01/29/2015 1545   CL 108 01/29/2015 1545   CO2 21* 05/11/2015 0821   CO2 26 01/29/2015 1545   BUN 22.8 05/11/2015 0821   BUN 29* 01/29/2015 1545   CREATININE 0.8 05/11/2015 0821   CREATININE 1.12* 01/29/2015 1545      Component Value Date/Time   CALCIUM 8.9 05/11/2015 0821   CALCIUM 9.6 01/29/2015 1545   ALKPHOS 90 05/11/2015 0821   AST 35* 05/11/2015 0821   ALT 35 05/11/2015 0821   BILITOT 0.54 05/11/2015 0821      Lab Results  Component Value Date   WBC 5.3 05/11/2015   HGB 10.0* 05/11/2015   HCT 29.9* 05/11/2015   MCV 98.1 05/11/2015   PLT 155 05/11/2015   NEUTROABS 4.2 05/11/2015   ASSESSMENT & PLAN:  Breast cancer of lower-inner quadrant of right female breast  ([place]) Right lumpectomy 02/03/2015: Invasive ductal carcinoma grade 2, 2.1 cm, with associated DCIS, margins are negative, 0/1 lymph node, ER 0%, PR 0%, HER-2 positive ratio 2.31, T2 N0 stage II a. 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 Adjuvant chemotherapy with TCH 6 cycles followed by Herceptin maintenance for 1 year Started 03/09/2015 2. Followed by radiation therapy -------------------------------------------------------------------------------------------------------------------------------------------------------- Current Treatment: Cycle 4 day 1 TCH ECHO 01/30/15: EF 65-70% Chemotherapy toxicities: 1. Constipation followed by diarrhea: Have resolved  2. Nausea grade 1: Resolved  3. Fatigue grade 2 4. Insomnia: Takes Ativan periodically 5. Chemotherapy-induced anemia hemoglobin is 10 today. Encouraged her to increase her iron containing foods in diet. Patient takes a medical food supplement for taste and she tells me that it's helping her.  Monitoring closely for chemotherapy toxicities I reviewed her blood work. RTC in 3 weeks for cycle 5  No orders of the defined types were placed in this encounter.   The patient has a good understanding of the overall plan. she agrees with it. she will call with any problems that may develop before the next visit here.   Rulon Eisenmenger, MD 05/11/2015

## 2015-05-11 NOTE — Assessment & Plan Note (Signed)
Right lumpectomy 02/03/2015: Invasive ductal carcinoma grade 2, 2.1 cm, with associated DCIS, margins are negative, 0/1 lymph node, ER 0%, PR 0%, HER-2 positive ratio 2.31, T2 N0 stage II a. 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 Adjuvant chemotherapy with TCH 6 cycles followed by Herceptin maintenance for 1 year Started 03/09/2015 2. Followed by radiation therapy -------------------------------------------------------------------------------------------------------------------------------------------------------- Current Treatment: Cycle 4 day 1 TCH ECHO 01/30/15: EF 65-70% Chemotherapy toxicities: 1. Constipation followed by diarrhea  2. Nausea grade 1  3. Fatigue grade 2 4. Insomnia: Takes Ativan periodically Patient takes a medical food supplement for taste and she tells me that it's helping her.  Monitoring closely for chemotherapy toxicities I reviewed her blood work. RTC in 3 weeks for cycle 5

## 2015-05-18 ENCOUNTER — Other Ambulatory Visit: Payer: Self-pay

## 2015-05-19 ENCOUNTER — Telehealth: Payer: Self-pay | Admitting: *Deleted

## 2015-05-19 NOTE — Telephone Encounter (Signed)
Returned call to pt concerning diarrhea since tx last week. I spoke with pt this am and she tells me she's been having 3-4 stools everyday. Pt is not taking anything to slow down the number of stools. I instructed pt to take 2 Imodium tablets right now , then  take 1 tablet per loose stool up to 4. Also encouraged pt to drink more water she's been drinking about 32 ozs, I told her to double that and she agreed. Denies any nausea. Pt verbalized understanding and readback instructions. I told her if stools do not slow down to give this nurse a callback today.

## 2015-05-19 NOTE — Telephone Encounter (Signed)
Called pt back to f/u from this morning's call. Pt took the 2 Imodium tablets this morning and has not had anymore loose stools nor needed to take add'l tablets. Pt has been drinking much better and pushing her intake of water as well.

## 2015-05-29 ENCOUNTER — Encounter: Payer: Self-pay | Admitting: Hematology and Oncology

## 2015-05-29 NOTE — Progress Notes (Signed)
cancer form left for dr. Lindi Adie and will call the patient once done

## 2015-06-01 ENCOUNTER — Ambulatory Visit (HOSPITAL_BASED_OUTPATIENT_CLINIC_OR_DEPARTMENT_OTHER): Payer: Medicare Other

## 2015-06-01 ENCOUNTER — Ambulatory Visit (HOSPITAL_BASED_OUTPATIENT_CLINIC_OR_DEPARTMENT_OTHER): Payer: Medicare Other | Admitting: Hematology and Oncology

## 2015-06-01 ENCOUNTER — Encounter: Payer: Self-pay | Admitting: Hematology and Oncology

## 2015-06-01 ENCOUNTER — Other Ambulatory Visit (HOSPITAL_BASED_OUTPATIENT_CLINIC_OR_DEPARTMENT_OTHER): Payer: Medicare Other

## 2015-06-01 ENCOUNTER — Telehealth: Payer: Self-pay | Admitting: Hematology and Oncology

## 2015-06-01 ENCOUNTER — Encounter: Payer: Self-pay | Admitting: *Deleted

## 2015-06-01 VITALS — BP 127/69 | HR 79 | Temp 97.8°F | Resp 18 | Wt 129.4 lb

## 2015-06-01 DIAGNOSIS — C50311 Malignant neoplasm of lower-inner quadrant of right female breast: Secondary | ICD-10-CM | POA: Diagnosis not present

## 2015-06-01 DIAGNOSIS — Z5189 Encounter for other specified aftercare: Secondary | ICD-10-CM | POA: Diagnosis not present

## 2015-06-01 DIAGNOSIS — Z5111 Encounter for antineoplastic chemotherapy: Secondary | ICD-10-CM | POA: Diagnosis not present

## 2015-06-01 DIAGNOSIS — Z5112 Encounter for antineoplastic immunotherapy: Secondary | ICD-10-CM

## 2015-06-01 DIAGNOSIS — Z171 Estrogen receptor negative status [ER-]: Secondary | ICD-10-CM | POA: Diagnosis not present

## 2015-06-01 DIAGNOSIS — D6481 Anemia due to antineoplastic chemotherapy: Secondary | ICD-10-CM | POA: Diagnosis not present

## 2015-06-01 LAB — CBC WITH DIFFERENTIAL/PLATELET
BASO%: 0.2 % (ref 0.0–2.0)
BASOS ABS: 0 10*3/uL (ref 0.0–0.1)
EOS ABS: 0 10*3/uL (ref 0.0–0.5)
EOS%: 0.2 % (ref 0.0–7.0)
HEMATOCRIT: 30.1 % — AB (ref 34.8–46.6)
HEMOGLOBIN: 9.9 g/dL — AB (ref 11.6–15.9)
LYMPH#: 0.7 10*3/uL — AB (ref 0.9–3.3)
LYMPH%: 17.1 % (ref 14.0–49.7)
MCH: 33.4 pg (ref 25.1–34.0)
MCHC: 32.9 g/dL (ref 31.5–36.0)
MCV: 101.7 fL — AB (ref 79.5–101.0)
MONO#: 0.4 10*3/uL (ref 0.1–0.9)
MONO%: 9.4 % (ref 0.0–14.0)
NEUT%: 73.1 % (ref 38.4–76.8)
NEUTROS ABS: 3 10*3/uL (ref 1.5–6.5)
PLATELETS: 152 10*3/uL (ref 145–400)
RBC: 2.96 10*6/uL — ABNORMAL LOW (ref 3.70–5.45)
RDW: 18.6 % — AB (ref 11.2–14.5)
WBC: 4 10*3/uL (ref 3.9–10.3)

## 2015-06-01 LAB — COMPREHENSIVE METABOLIC PANEL
ALBUMIN: 3.6 g/dL (ref 3.5–5.0)
ALK PHOS: 86 U/L (ref 40–150)
ALT: 21 U/L (ref 0–55)
AST: 19 U/L (ref 5–34)
Anion Gap: 8 mEq/L (ref 3–11)
BILIRUBIN TOTAL: 0.75 mg/dL (ref 0.20–1.20)
BUN: 20.4 mg/dL (ref 7.0–26.0)
CALCIUM: 9.1 mg/dL (ref 8.4–10.4)
CO2: 24 mEq/L (ref 22–29)
CREATININE: 0.9 mg/dL (ref 0.6–1.1)
Chloride: 108 mEq/L (ref 98–109)
EGFR: 68 mL/min/{1.73_m2} — ABNORMAL LOW (ref >90)
GLUCOSE: 98 mg/dL (ref 70–140)
POTASSIUM: 3.6 meq/L (ref 3.5–5.1)
Sodium: 141 mEq/L (ref 136–145)
TOTAL PROTEIN: 6.2 g/dL — AB (ref 6.4–8.3)

## 2015-06-01 MED ORDER — SODIUM CHLORIDE 0.9 % IV SOLN
Freq: Once | INTRAVENOUS | Status: AC
  Administered 2015-06-01: 11:00:00 via INTRAVENOUS

## 2015-06-01 MED ORDER — PEGFILGRASTIM 6 MG/0.6ML ~~LOC~~ PSKT
6.0000 mg | PREFILLED_SYRINGE | Freq: Once | SUBCUTANEOUS | Status: AC
  Administered 2015-06-01: 6 mg via SUBCUTANEOUS
  Filled 2015-06-01: qty 0.6

## 2015-06-01 MED ORDER — ACETAMINOPHEN 325 MG PO TABS
650.0000 mg | ORAL_TABLET | Freq: Once | ORAL | Status: AC
  Administered 2015-06-01: 650 mg via ORAL

## 2015-06-01 MED ORDER — HEPARIN SOD (PORK) LOCK FLUSH 100 UNIT/ML IV SOLN
500.0000 [IU] | Freq: Once | INTRAVENOUS | Status: AC | PRN
  Administered 2015-06-01: 500 [IU]
  Filled 2015-06-01: qty 5

## 2015-06-01 MED ORDER — PALONOSETRON HCL INJECTION 0.25 MG/5ML
0.2500 mg | Freq: Once | INTRAVENOUS | Status: AC
  Administered 2015-06-01: 0.25 mg via INTRAVENOUS

## 2015-06-01 MED ORDER — TRASTUZUMAB CHEMO INJECTION 440 MG
6.0000 mg/kg | Freq: Once | INTRAVENOUS | Status: AC
  Administered 2015-06-01: 378 mg via INTRAVENOUS
  Filled 2015-06-01: qty 18

## 2015-06-01 MED ORDER — ACETAMINOPHEN 325 MG PO TABS
ORAL_TABLET | ORAL | Status: AC
  Filled 2015-06-01: qty 2

## 2015-06-01 MED ORDER — SODIUM CHLORIDE 0.9 % IV SOLN
75.0000 mg/m2 | Freq: Once | INTRAVENOUS | Status: AC
  Administered 2015-06-01: 130 mg via INTRAVENOUS
  Filled 2015-06-01: qty 13

## 2015-06-01 MED ORDER — DIPHENHYDRAMINE HCL 25 MG PO CAPS
ORAL_CAPSULE | ORAL | Status: AC
  Filled 2015-06-01: qty 1

## 2015-06-01 MED ORDER — SODIUM CHLORIDE 0.9% FLUSH
10.0000 mL | INTRAVENOUS | Status: DC | PRN
  Administered 2015-06-01: 10 mL
  Filled 2015-06-01: qty 10

## 2015-06-01 MED ORDER — SODIUM CHLORIDE 0.9 % IV SOLN
10.0000 mg | Freq: Once | INTRAVENOUS | Status: AC
  Administered 2015-06-01: 10 mg via INTRAVENOUS
  Filled 2015-06-01: qty 1

## 2015-06-01 MED ORDER — SODIUM CHLORIDE 0.9 % IV SOLN: Freq: Once | INTRAVENOUS | Status: AC

## 2015-06-01 MED ORDER — DIPHENHYDRAMINE HCL 25 MG PO CAPS
50.0000 mg | ORAL_CAPSULE | Freq: Once | ORAL | Status: AC
  Administered 2015-06-01: 50 mg via ORAL

## 2015-06-01 MED ORDER — PALONOSETRON HCL INJECTION 0.25 MG/5ML
INTRAVENOUS | Status: AC
  Filled 2015-06-01: qty 5

## 2015-06-01 MED ORDER — SODIUM CHLORIDE 0.9 % IV SOLN
445.2000 mg | Freq: Once | INTRAVENOUS | Status: AC
  Administered 2015-06-01: 450 mg via INTRAVENOUS
  Filled 2015-06-01: qty 45

## 2015-06-01 NOTE — Telephone Encounter (Signed)
appt made and avs printed °

## 2015-06-01 NOTE — Assessment & Plan Note (Signed)
Right lumpectomy 02/03/2015: Invasive ductal carcinoma grade 2, 2.1 cm, with associated DCIS, margins are negative, 0/1 lymph node, ER 0%, PR 0%, HER-2 positive ratio 2.31, T2 N0 stage II a. 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 Adjuvant chemotherapy with TCH 6 cycles followed by Herceptin maintenance for 1 year Started 03/09/2015 2. Followed by radiation therapy -------------------------------------------------------------------------------------------------------------------------------------------------------- Current Treatment: Cycle 5 day 1 TCH ECHO 01/30/15: EF 65-70% Chemotherapy toxicities: 1. Constipation followed by diarrhea: Have resolved  2. Nausea grade 1: Resolved  3. Fatigue grade 2 4. Insomnia: Takes Ativan periodically 5. Chemotherapy-induced anemia hemoglobin is 10 today. Encouraged her to increase her iron containing foods in diet. Patient takes a medical food supplement for taste and she tells me that it's helping her.  Monitoring closely for chemotherapy toxicities I reviewed her blood work. RTC in 3 weeks for cycle 6

## 2015-06-01 NOTE — Patient Instructions (Signed)
[facility] Discharge Instructions for Patients Receiving Chemotherapy  Today you received the following chemotherapy agents:  Herceptin, Taxotere and Carboplatin  To help prevent nausea and vomiting after your treatment, we encourage you to take your nausea medication as ordered per MD.   If you develop nausea and vomiting that is not controlled by your nausea medication, call the clinic.   BELOW ARE SYMPTOMS THAT SHOULD BE REPORTED IMMEDIATELY:  *FEVER GREATER THAN 100.5 F  *CHILLS WITH OR WITHOUT FEVER  NAUSEA AND VOMITING THAT IS NOT CONTROLLED WITH YOUR NAUSEA MEDICATION  *UNUSUAL SHORTNESS OF BREATH  *UNUSUAL BRUISING OR BLEEDING  TENDERNESS IN MOUTH AND THROAT WITH OR WITHOUT PRESENCE OF ULCERS  *URINARY PROBLEMS  *BOWEL PROBLEMS  UNUSUAL RASH Items with * indicate a potential emergency and should be followed up as soon as possible.  Feel free to call the clinic you have any questions or concerns. The clinic phone number is [phone].  Please show the [place] at check-in to the Emergency Department and triage nurse.

## 2015-06-01 NOTE — Progress Notes (Signed)
Patient Care Team: [name], MD as PCP - General (Internal Medicine) [name], MD as Consulting Physician (General Surgery) [name], MD as Consulting Physician (Hematology and Oncology)  DIAGNOSIS: No matching staging information was found for the patient.  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 Today is cycle 5. She appears to be tolerating chemotherapy fairly well. She does have poor appetite and lack of taste. She had lost 7 pounds since 3 weeks ago. She's been finding it more protein. Denies any nausea or vomiting.  REVIEW OF SYSTEMS:   Constitutional: Denies fevers, chills or abnormal weight loss Eyes: Denies blurriness of vision Ears, nose, mouth, throat, and face: Denies mucositis or sore throat Respiratory: Denies cough, dyspnea or wheezes Cardiovascular: Denies palpitation, chest discomfort Gastrointestinal:  Denies nausea, heartburn or change in bowel habits Skin: Denies abnormal skin rashes Lymphatics: Denies new lymphadenopathy or easy bruising Neurological:Denies numbness, tingling or new  weaknesses Behavioral/Psych: Mood is stable, no new changes  Extremities: No lower extremity edema Breast:  denies any pain or lumps or nodules in either breasts All other systems were reviewed with the patient and are negative.  I have reviewed the past medical history, past surgical history, social history and family history with the patient and they are unchanged from previous note.  ALLERGIES:  has No Known Allergies.  MEDICATIONS:  Current Outpatient Prescriptions  Medication Sig Dispense Refill  . ALPRAZolam (XANAX) 0.25 MG tablet Take 0.25-0.5 mg by mouth 2 (two) times daily as needed for anxiety.    . Calcium Carbonate-Vitamin D (CALCIUM 600 + D PO) Take 1 tablet by mouth daily.     . Cholecalciferol (VITAMIN D) 2000 UNITS tablet Take 2,000 Units by mouth daily.      . CRESTOR 20 MG tablet Take 1 tablet by mouth daily.  0  . dexamethasone (DECADRON) 4 MG tablet Take 1 tablet (4 mg total) by mouth daily. Start the day before Taxotere. Then again the day after chemo for 2 days. 30 tablet 1  . lidocaine-prilocaine (EMLA) cream Apply to affected area once 30 g 3  . LORazepam (ATIVAN) 0.5 MG tablet Take 1 tablet (0.5 mg total) by mouth at bedtime. 30 tablet 0  . ondansetron (ZOFRAN) 8 MG tablet Take 1 tablet (8 mg total) by mouth 2 (two) times daily as needed for refractory nausea / vomiting. Start on day 3 after chemo. 30 tablet 1  . prochlorperazine (COMPAZINE) 10 MG tablet Take 1 tablet (10 mg total) by mouth every 6 (six) hours as needed (Nausea or vomiting). 30 tablet 1  . telmisartan (MICARDIS) 80 MG tablet Take 1 tablet (80 mg total)  by mouth daily.    Marland Kitchen tolterodine (DETROL LA) 4 MG 24 hr capsule Take 1 capsule (4 mg total) by mouth daily.    . zoledronic acid (RECLAST) 5 MG/100ML SOLN Inject 5 mg into the vein once. annually      No current facility-administered medications for this visit.    PHYSICAL EXAMINATION: ECOG PERFORMANCE STATUS: 1 - Symptomatic but completely  ambulatory  Filed Vitals:   06/01/15 0906  BP: 127/69  Pulse: 79  Temp: 97.8 F (36.6 C)  Resp: 18   Filed Weights   06/01/15 0906  Weight: 129 lb 6 oz (58.684 kg)    GENERAL:alert, no distress and comfortable SKIN: skin color, texture, turgor are normal, no rashes or significant lesions EYES: normal, Conjunctiva are pink and non-injected, sclera clear OROPHARYNX:no exudate, no erythema and lips, buccal mucosa, and tongue normal  NECK: supple, thyroid normal size, non-tender, without nodularity LYMPH:  no palpable lymphadenopathy in the cervical, axillary or inguinal LUNGS: clear to auscultation and percussion with normal breathing effort HEART: regular rate & rhythm and no murmurs and no lower extremity edema ABDOMEN:abdomen soft, non-tender and normal bowel sounds MUSCULOSKELETAL:no cyanosis of digits and no clubbing  NEURO: alert & oriented x 3 with fluent speech, no focal motor/sensory deficits EXTREMITIES: No lower extremity edema  LABORATORY DATA:  I have reviewed the data as listed   Chemistry      Component Value Date/Time   NA 141 06/01/2015 0843   NA 142 01/29/2015 1545   K 3.6 06/01/2015 0843   K 4.5 01/29/2015 1545   CL 108 01/29/2015 1545   CO2 24 06/01/2015 0843   CO2 26 01/29/2015 1545   BUN 20.4 06/01/2015 0843   BUN 29* 01/29/2015 1545   CREATININE 0.9 06/01/2015 0843   CREATININE 1.12* 01/29/2015 1545      Component Value Date/Time   CALCIUM 9.1 06/01/2015 0843   CALCIUM 9.6 01/29/2015 1545   ALKPHOS 86 06/01/2015 0843   AST 19 06/01/2015 0843   ALT 21 06/01/2015 0843   BILITOT 0.75 06/01/2015 0843       Lab Results  Component Value Date   WBC 4.0 06/01/2015   HGB 9.9* 06/01/2015   HCT 30.1* 06/01/2015   MCV 101.7* 06/01/2015   PLT 152 06/01/2015   NEUTROABS 3.0 06/01/2015     ASSESSMENT & PLAN:  Breast cancer of lower-inner quadrant of right female breast ([place]) Right lumpectomy 02/03/2015: Invasive ductal carcinoma grade 2, 2.1  cm, with associated DCIS, margins are negative, 0/1 lymph node, ER 0%, PR 0%, HER-2 positive ratio 2.31, T2 N0 stage II a. 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 Adjuvant chemotherapy with TCH 6 cycles followed by Herceptin maintenance for 1 year Started 03/09/2015 2. Followed by radiation therapy -------------------------------------------------------------------------------------------------------------------------------------------------------- Current Treatment: Cycle 5 day 1 TCH ECHO 01/30/15: EF 65-70% Chemotherapy toxicities: 1. Constipation followed by diarrhea: Have resolved  2. Nausea grade 1: Resolved  3. Fatigue grade 2 4. Insomnia: Takes Ativan periodically 5. Chemotherapy-induced anemia hemoglobin is 9.9 today. Encouraged her to increase her iron containing foods in diet. Patient takes a medical food supplement for taste and she tells me that it's helping her. 6. Taste changes  Monitoring closely for chemotherapy toxicities I reviewed her blood work. I sent a message to Dr. [name] to see  her after cycle 6 of chemotherapy regarding adjuvant radiation  RTC in 3 weeks for cycle 6   No orders of the defined types were placed in this encounter.   The patient has a good understanding of the overall plan. she agrees with it. she will call with any problems that may develop before the next visit here.   Rulon Eisenmenger, MD 06/01/2015

## 2015-06-22 ENCOUNTER — Ambulatory Visit (HOSPITAL_BASED_OUTPATIENT_CLINIC_OR_DEPARTMENT_OTHER): Payer: Medicare Other

## 2015-06-22 ENCOUNTER — Encounter: Payer: Self-pay | Admitting: *Deleted

## 2015-06-22 ENCOUNTER — Other Ambulatory Visit (HOSPITAL_BASED_OUTPATIENT_CLINIC_OR_DEPARTMENT_OTHER): Payer: Medicare Other

## 2015-06-22 ENCOUNTER — Ambulatory Visit (HOSPITAL_BASED_OUTPATIENT_CLINIC_OR_DEPARTMENT_OTHER): Payer: Medicare Other | Admitting: Hematology and Oncology

## 2015-06-22 ENCOUNTER — Encounter: Payer: Self-pay | Admitting: Hematology and Oncology

## 2015-06-22 VITALS — BP 127/74 | HR 78 | Temp 98.7°F | Resp 18 | Ht 68.0 in | Wt 131.0 lb

## 2015-06-22 DIAGNOSIS — D6481 Anemia due to antineoplastic chemotherapy: Secondary | ICD-10-CM | POA: Diagnosis not present

## 2015-06-22 DIAGNOSIS — Z171 Estrogen receptor negative status [ER-]: Secondary | ICD-10-CM

## 2015-06-22 DIAGNOSIS — C50311 Malignant neoplasm of lower-inner quadrant of right female breast: Secondary | ICD-10-CM | POA: Diagnosis not present

## 2015-06-22 DIAGNOSIS — Z5189 Encounter for other specified aftercare: Secondary | ICD-10-CM

## 2015-06-22 DIAGNOSIS — Z5112 Encounter for antineoplastic immunotherapy: Secondary | ICD-10-CM

## 2015-06-22 DIAGNOSIS — Z5111 Encounter for antineoplastic chemotherapy: Secondary | ICD-10-CM

## 2015-06-22 LAB — COMPREHENSIVE METABOLIC PANEL
ALT: 24 U/L (ref 0–55)
ANION GAP: 9 meq/L (ref 3–11)
AST: 21 U/L (ref 5–34)
Albumin: 3.6 g/dL (ref 3.5–5.0)
Alkaline Phosphatase: 83 U/L (ref 40–150)
BUN: 21.8 mg/dL (ref 7.0–26.0)
CALCIUM: 9.2 mg/dL (ref 8.4–10.4)
CHLORIDE: 111 meq/L — AB (ref 98–109)
CO2: 23 meq/L (ref 22–29)
Creatinine: 0.8 mg/dL (ref 0.6–1.1)
EGFR: 71 mL/min/{1.73_m2} — ABNORMAL LOW (ref >90)
Glucose: 71 mg/dl (ref 70–140)
POTASSIUM: 4.4 meq/L (ref 3.5–5.1)
Sodium: 143 mEq/L (ref 136–145)
Total Bilirubin: 0.62 mg/dL (ref 0.20–1.20)
Total Protein: 5.9 g/dL — ABNORMAL LOW (ref 6.4–8.3)

## 2015-06-22 LAB — CBC WITH DIFFERENTIAL/PLATELET
BASO%: 0.2 % (ref 0.0–2.0)
BASOS ABS: 0 10*3/uL (ref 0.0–0.1)
EOS ABS: 0 10*3/uL (ref 0.0–0.5)
EOS%: 0.2 % (ref 0.0–7.0)
HCT: 29.6 % — ABNORMAL LOW (ref 34.8–46.6)
HGB: 9.6 g/dL — ABNORMAL LOW (ref 11.6–15.9)
LYMPH%: 16.7 % (ref 14.0–49.7)
MCH: 34.8 pg — ABNORMAL HIGH (ref 25.1–34.0)
MCHC: 32.4 g/dL (ref 31.5–36.0)
MCV: 107.2 fL — AB (ref 79.5–101.0)
MONO#: 0.3 10*3/uL (ref 0.1–0.9)
MONO%: 6.1 % (ref 0.0–14.0)
NEUT%: 76.8 % (ref 38.4–76.8)
NEUTROS ABS: 3.9 10*3/uL (ref 1.5–6.5)
Platelets: 136 10*3/uL — ABNORMAL LOW (ref 145–400)
RBC: 2.76 10*6/uL — AB (ref 3.70–5.45)
RDW: 18.4 % — ABNORMAL HIGH (ref 11.2–14.5)
WBC: 5.1 10*3/uL (ref 3.9–10.3)
lymph#: 0.9 10*3/uL (ref 0.9–3.3)

## 2015-06-22 MED ORDER — ACETAMINOPHEN 325 MG PO TABS
ORAL_TABLET | ORAL | Status: AC
  Filled 2015-06-22: qty 2

## 2015-06-22 MED ORDER — SODIUM CHLORIDE 0.9 % IV SOLN
75.0000 mg/m2 | Freq: Once | INTRAVENOUS | Status: AC
  Administered 2015-06-22: 130 mg via INTRAVENOUS
  Filled 2015-06-22: qty 13

## 2015-06-22 MED ORDER — SODIUM CHLORIDE 0.9 % IV SOLN
Freq: Once | INTRAVENOUS | Status: AC
  Administered 2015-06-22: 10:00:00 via INTRAVENOUS

## 2015-06-22 MED ORDER — HEPARIN SOD (PORK) LOCK FLUSH 100 UNIT/ML IV SOLN
500.0000 [IU] | Freq: Once | INTRAVENOUS | Status: AC | PRN
  Administered 2015-06-22: 500 [IU]
  Filled 2015-06-22: qty 5

## 2015-06-22 MED ORDER — PALONOSETRON HCL INJECTION 0.25 MG/5ML
0.2500 mg | Freq: Once | INTRAVENOUS | Status: AC
  Administered 2015-06-22: 0.25 mg via INTRAVENOUS

## 2015-06-22 MED ORDER — DEXAMETHASONE SODIUM PHOSPHATE 100 MG/10ML IJ SOLN
10.0000 mg | Freq: Once | INTRAMUSCULAR | Status: AC
  Administered 2015-06-22: 10 mg via INTRAVENOUS
  Filled 2015-06-22: qty 1

## 2015-06-22 MED ORDER — DIPHENHYDRAMINE HCL 25 MG PO CAPS
50.0000 mg | ORAL_CAPSULE | Freq: Once | ORAL | Status: AC
  Administered 2015-06-22: 50 mg via ORAL

## 2015-06-22 MED ORDER — ACETAMINOPHEN 325 MG PO TABS
650.0000 mg | ORAL_TABLET | Freq: Once | ORAL | Status: AC
  Administered 2015-06-22: 650 mg via ORAL

## 2015-06-22 MED ORDER — PALONOSETRON HCL INJECTION 0.25 MG/5ML
INTRAVENOUS | Status: AC
  Filled 2015-06-22: qty 5

## 2015-06-22 MED ORDER — SODIUM CHLORIDE 0.9% FLUSH
10.0000 mL | INTRAVENOUS | Status: DC | PRN
  Administered 2015-06-22: 10 mL
  Filled 2015-06-22: qty 10

## 2015-06-22 MED ORDER — TRASTUZUMAB CHEMO INJECTION 440 MG
6.0000 mg/kg | Freq: Once | INTRAVENOUS | Status: AC
  Administered 2015-06-22: 378 mg via INTRAVENOUS
  Filled 2015-06-22: qty 18

## 2015-06-22 MED ORDER — SODIUM CHLORIDE 0.9 % IV SOLN
445.2000 mg | Freq: Once | INTRAVENOUS | Status: AC
  Administered 2015-06-22: 450 mg via INTRAVENOUS
  Filled 2015-06-22: qty 45

## 2015-06-22 MED ORDER — PEGFILGRASTIM 6 MG/0.6ML ~~LOC~~ PSKT
6.0000 mg | PREFILLED_SYRINGE | Freq: Once | SUBCUTANEOUS | Status: AC
  Administered 2015-06-22: 6 mg via SUBCUTANEOUS
  Filled 2015-06-22: qty 0.6

## 2015-06-22 MED ORDER — DIPHENHYDRAMINE HCL 25 MG PO CAPS
ORAL_CAPSULE | ORAL | Status: AC
  Filled 2015-06-22: qty 2

## 2015-06-22 NOTE — Patient Instructions (Signed)
[facility] Discharge Instructions for Patients Receiving Chemotherapy  Today you received the following chemotherapy agents: Herceptin & your LAST Taxotere & Carboplatin!  To help prevent nausea and vomiting after your treatment, we encourage you to take your nausea medication as prescribed by your physician.  You will receive your Herceptin every 3 weeks.   If you develop nausea and vomiting that is not controlled by your nausea medication, call the clinic.   BELOW ARE SYMPTOMS THAT SHOULD BE REPORTED IMMEDIATELY:  *FEVER GREATER THAN 100.5 F  *CHILLS WITH OR WITHOUT FEVER  NAUSEA AND VOMITING THAT IS NOT CONTROLLED WITH YOUR NAUSEA MEDICATION  *UNUSUAL SHORTNESS OF BREATH  *UNUSUAL BRUISING OR BLEEDING  TENDERNESS IN MOUTH AND THROAT WITH OR WITHOUT PRESENCE OF ULCERS  *URINARY PROBLEMS  *BOWEL PROBLEMS  UNUSUAL RASH Items with * indicate a potential emergency and should be followed up as soon as possible.  Feel free to call the clinic you have any questions or concerns. The clinic phone number is [phone].  Please show the [place] at check-in to the Emergency Department and triage nurse.

## 2015-06-22 NOTE — Assessment & Plan Note (Signed)
Right lumpectomy 02/03/2015: Invasive ductal carcinoma grade 2, 2.1 cm, with associated DCIS, margins are negative, 0/1 lymph node, ER 0%, PR 0%, HER-2 positive ratio 2.31, T2 N0 stage II a. 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 Adjuvant chemotherapy with TCH 6 cycles followed by Herceptin maintenance for 1 year Started 03/09/2015 2. Followed by radiation therapy -------------------------------------------------------------------------------------------------------------------------------------------------------- Current Treatment: Cycle 5 day 1 TCH ECHO 4/17: EF 65-70% Chemotherapy toxicities: 1. Constipation followed by diarrhea: Have resolved  2. Nausea grade 1: Resolved  3. Fatigue grade 2 4. Insomnia: Takes Ativan periodically 5. Chemotherapy-induced anemia hemoglobin is 9.9 today. Encouraged her to increase her iron containing foods in diet. Patient takes a medical food supplement for taste and she tells me that it's helping her. 6. Taste changes 7. Nail changes related to chemotherapy 8. Bilateral ankle swelling: Related to decreased physical activity. Her echocardiogram in April was normal.    Monitoring closely for chemotherapy toxicities I reviewed her blood work.  RTC in 3 weeks for Herceptin maintenance in 6 weeks for follow-up

## 2015-06-22 NOTE — Progress Notes (Signed)
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 She has noticed nail changes since the last chemotherapy with change in the texture as well as the color of the nail. Denies any nausea or vomiting. She does have significant fatigue. She has noticed bilateral ankle edema. Denies any shortness of breath with exertion.  REVIEW OF SYSTEMS:   Constitutional: Denies fevers, chills or abnormal weight loss Eyes: Denies blurriness of vision Ears, nose, mouth, throat, and face: Denies mucositis or sore throat Respiratory: Denies cough, dyspnea or wheezes Cardiovascular: Denies palpitation, chest discomfort Gastrointestinal:  Denies nausea, heartburn or change in bowel habits Skin: Denies abnormal skin rashes Lymphatics: Denies new lymphadenopathy or easy bruising Neurological:Denies numbness, tingling or new  weaknesses Behavioral/Psych: Mood is stable, no new changes  Extremities: Ankle edema Breast:  denies any pain or lumps or nodules in either breasts All other systems were reviewed with the patient and are negative.  I have reviewed the past medical history, past surgical history, social history and family history with the patient and they are unchanged from previous note.  ALLERGIES:  has No Known Allergies.  MEDICATIONS:  Current Outpatient Prescriptions  Medication Sig Dispense Refill  . ALPRAZolam (XANAX) 0.25 MG tablet Take 0.25-0.5 mg by mouth 2 (two) times daily as needed for anxiety.    . Calcium Carbonate-Vitamin D (CALCIUM 600 + D PO) Take 1 tablet by mouth daily.     . Cholecalciferol (VITAMIN D) 2000 UNITS tablet Take 2,000 Units by mouth daily.      . CRESTOR 20 MG tablet Take 1 tablet by mouth daily.  0  . dexamethasone (DECADRON) 4 MG tablet Take 1 tablet (4 mg total) by mouth daily. Start the day before Taxotere. Then again the day after chemo for 2 days. 30 tablet 1  . lidocaine-prilocaine (EMLA) cream Apply to affected area once 30 g 3  . LORazepam (ATIVAN) 0.5 MG tablet Take 1 tablet (0.5 mg total) by mouth at bedtime. 30 tablet 0  . ondansetron (ZOFRAN) 8 MG tablet Take 1 tablet (8 mg total) by mouth 2 (two) times daily as needed for refractory nausea / vomiting. Start on day 3 after chemo. 30 tablet 1  . prochlorperazine (COMPAZINE) 10 MG tablet Take 1 tablet (10 mg total) by mouth every 6 (six) hours as needed (Nausea or vomiting). 30 tablet 1  . telmisartan (MICARDIS) 80 MG tablet Take 1 tablet (80 mg total) by mouth daily.    Marland Kitchen  tolterodine (DETROL LA) 4 MG 24 hr capsule Take 1 capsule (4 mg total) by mouth daily.    . zoledronic acid (RECLAST) 5 MG/100ML SOLN Inject 5 mg into the vein once. annually      No current facility-administered medications for this visit.   Facility-Administered Medications Ordered in Other Visits  Medication Dose Route Frequency Provider  Last Rate Last Dose  . 0.9 %  sodium chloride infusion   Intravenous Once [name], MD      . CARBOplatin (PARAPLATIN) 450 mg in sodium chloride 0.9 % 250 mL chemo infusion  450 mg Intravenous Once [name], MD      . dexamethasone (DECADRON) 10 mg in sodium chloride 0.9 % 50 mL IVPB  10 mg Intravenous Once [name], MD   10 mg at 06/22/15 1025  . DOCEtaxel (TAXOTERE) 130 mg in sodium chloride 0.9 % 250 mL chemo infusion  75 mg/m2 (Treatment Plan Actual) Intravenous Once [name], MD      . heparin lock flush 100 unit/mL  500 Units Intracatheter Once PRN [name], MD      . pegfilgrastim (NEULASTA ONPRO KIT) injection 6 mg  6 mg Subcutaneous Once [name], MD      . sodium chloride flush (NS) 0.9 % injection 10 mL  10 mL Intracatheter PRN [name], MD      . trastuzumab (HERCEPTIN) 378 mg in sodium chloride 0.9 % 250 mL chemo infusion  6 mg/kg (Treatment Plan Actual) Intravenous Once [name], MD        PHYSICAL EXAMINATION: ECOG PERFORMANCE STATUS: 1 - Symptomatic but completely ambulatory  Filed Vitals:   06/22/15 0851  BP: 127/74  Pulse: 78  Temp: 98.7 F (37.1 C)  Resp: 18   Filed Weights   06/22/15 0851  Weight: 131 lb (59.421 kg)    GENERAL:alert, no distress and comfortable SKIN: skin color, texture, turgor are normal, no rashes or significant lesions EYES: normal, Conjunctiva are pink and non-injected, sclera clear OROPHARYNX:no exudate, no erythema and lips, buccal mucosa, and tongue normal  NECK: supple, thyroid normal size, non-tender, without nodularity LYMPH:  no palpable lymphadenopathy in the cervical, axillary or inguinal LUNGS: clear to auscultation and percussion with normal breathing effort HEART: regular rate & rhythm and no murmurs and no lower extremity edema ABDOMEN:abdomen soft, non-tender and normal bowel sounds MUSCULOSKELETAL:no cyanosis of digits and no clubbing  NEURO: alert & oriented x 3 with fluent speech, no focal  motor/sensory deficits EXTREMITIES: 1+ ankle edema  LABORATORY DATA:  I have reviewed the data as listed   Chemistry      Component Value Date/Time   NA 143 06/22/2015 0837   NA 142 01/29/2015 1545   K 4.4 06/22/2015 0837   K 4.5 01/29/2015 1545   CL 108 01/29/2015 1545   CO2 23 06/22/2015 0837   CO2 26 01/29/2015 1545   BUN 21.8 06/22/2015 0837   BUN 29* 01/29/2015 1545   CREATININE 0.8 06/22/2015 0837   CREATININE 1.12* 01/29/2015 1545      Component Value Date/Time   CALCIUM 9.2 06/22/2015 0837   CALCIUM 9.6 01/29/2015 1545   ALKPHOS 83 06/22/2015 0837   AST 21 06/22/2015 0837   ALT 24 06/22/2015 0837   BILITOT 0.62 06/22/2015 0837       Lab Results  Component Value Date   WBC 5.1 06/22/2015   HGB 9.6* 06/22/2015   HCT 29.6* 06/22/2015   MCV 107.2* 06/22/2015   PLT 136* 06/22/2015  NEUTROABS 3.9 06/22/2015     ASSESSMENT & PLAN:  Breast cancer of lower-inner quadrant of right female breast ([place]) Right lumpectomy 02/03/2015: Invasive ductal carcinoma grade 2, 2.1 cm, with associated DCIS, margins are negative, 0/1 lymph node, ER 0%, PR 0%, HER-2 positive ratio 2.31, T2 N0 stage II a. 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 Adjuvant chemotherapy with TCH 6 cycles followed by Herceptin maintenance for 1 year Started 03/09/2015 2. Followed by radiation therapy -------------------------------------------------------------------------------------------------------------------------------------------------------- Current Treatment: Cycle 5 day 1 TCH ECHO 4/17: EF 65-70% Chemotherapy toxicities: 1. Constipation followed by diarrhea: Have resolved  2. Nausea grade 1: Resolved  3. Fatigue grade 2 4. Insomnia: Takes Ativan periodically 5. Chemotherapy-induced  anemia hemoglobin is 9.9 today. Encouraged her to increase her iron containing foods in diet. Patient takes a medical food supplement for taste and she tells me that it's helping her. 6. Taste changes 7. Nail changes related to chemotherapy 8. Bilateral ankle swelling: Related to decreased physical activity. Her echocardiogram in April was normal.  Will contact Dr. Clayborne Dana office to schedule her next echo  Monitoring closely for chemotherapy toxicities I reviewed her blood work.  RTC in 3 weeks for Herceptin maintenance in 6 weeks for follow-up   No orders of the defined types were placed in this encounter.   The patient has a good understanding of the overall plan. she agrees with it. she will call with any problems that may develop before the next visit here.   Rulon Eisenmenger, MD 06/22/2015

## 2015-06-25 NOTE — Progress Notes (Signed)
Location of Breast Cancer: Right Breast  Histology per Pathology Report:  Right lumpectomy 02/03/2015: Invasive ductal carcinoma grade 2, 2.1 cm, with associated DCIS, margins are negative, 0/1 lymph node, ER 0%, PR 0%, HER-2 positive ratio 2.31, T2 N0 stage II a. 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  Past/Anticipated interventions by surgeon, if any: Dr. [name] 02/03/15 PROCEDURE: Procedure(s): RIGHT BREAST LUMPECTOMY WITH RADIOACTIVE SEED AND RIGHT SENTINEL LYMPH NODE BIOPSY,  INSERTION PORT-A-CATH   Past/Anticipated interventions by medical oncology, if any:  Dr. Lindi Adie  03/09/2015 -  Chemotherapy Adjuvant chemotherapy with [place] 6 followed by Herceptin maintenance for 1 year       Cycle #6 of [place] was 06/22/15  Lymphedema issues, if any:  No  Pain issues, if any:  No  SAFETY ISSUES:  Prior radiation? No  Pacemaker/ICD? No  Possible current pregnancy? No  Is the patient on methotrexate? No  Current Complaints / other details:   She let me know that she doesn't want to start radiation till after her family beach trip which will be 08/09/15.  BP 105/62 mmHg  Pulse 84  Temp(Src) 97.9 F (36.6 C)  Ht '5\' 8"'  (1.727 m)  Wt 124 lb 6.4 oz (56.427 kg)  BMI 18.92 kg/m2  LMP 01/17/1982 (Approximate)   Wt Readings from Last 3 Encounters:  07/01/15 124 lb 6.4 oz (56.427 kg)  06/22/15 131 lb (59.421 kg)  06/01/15 129 lb 6 oz (58.684 kg)      Francile Woolford, Stephani Police, RN 06/25/2015,2:50 PM

## 2015-06-29 ENCOUNTER — Encounter: Payer: Self-pay | Admitting: Internal Medicine

## 2015-07-01 ENCOUNTER — Encounter: Payer: Self-pay | Admitting: Radiation Oncology

## 2015-07-01 ENCOUNTER — Ambulatory Visit
Admission: RE | Admit: 2015-07-01 | Discharge: 2015-07-01 | Disposition: A | Discharge status: Home / Self Care | Payer: Medicare Other | Source: Ambulatory Visit | Attending: Radiation Oncology | Admitting: Radiation Oncology

## 2015-07-01 VITALS — BP 105/62 | HR 84 | Temp 97.9°F | Ht 68.0 in | Wt 124.4 lb

## 2015-07-01 DIAGNOSIS — Z803 Family history of malignant neoplasm of breast: Secondary | ICD-10-CM | POA: Diagnosis not present

## 2015-07-01 DIAGNOSIS — C50311 Malignant neoplasm of lower-inner quadrant of right female breast: Secondary | ICD-10-CM

## 2015-07-01 DIAGNOSIS — M810 Age-related osteoporosis without current pathological fracture: Secondary | ICD-10-CM | POA: Insufficient documentation

## 2015-07-01 DIAGNOSIS — Z171 Estrogen receptor negative status [ER-]: Secondary | ICD-10-CM | POA: Diagnosis not present

## 2015-07-01 DIAGNOSIS — I10 Essential (primary) hypertension: Secondary | ICD-10-CM | POA: Diagnosis not present

## 2015-07-01 DIAGNOSIS — E785 Hyperlipidemia, unspecified: Secondary | ICD-10-CM | POA: Insufficient documentation

## 2015-07-01 DIAGNOSIS — Z87442 Personal history of urinary calculi: Secondary | ICD-10-CM | POA: Diagnosis not present

## 2015-07-01 DIAGNOSIS — F1010 Alcohol abuse, uncomplicated: Secondary | ICD-10-CM | POA: Insufficient documentation

## 2015-07-01 HISTORY — DX: Malignant neoplasm of unspecified site of unspecified female breast: C50.919

## 2015-07-01 NOTE — Progress Notes (Addendum)
Radiation Oncology         [phone] ________________________________  Name: Shawna [name] MRN: [id number]  Date: 07/01/2015  DOB: 1940/08/05  Follow-Up Visit Note  Outpatient  CC: Precious Reel, MD  [name], MD  Diagnosis:      ICD-9-CM ICD-10-CM   1. Breast cancer of lower-inner quadrant of right female breast ([place]) 174.3 C50.311 Ambulatory referral to Social Work  pT2N0 cM0 Stage IIA Grade 2 Invasive ductal carcinoma with DCIS, ER/PRneg HER2 neg  Narrative:  The patient returns today for follow-up.     Since consultation, she underwent lumpectomy and SLN biopsy (right side) on 02-03-15 revealing a 2.1 cm tumor with negative margins to the invasive and in situ disease, with characteristics as above.  She then received adjuvant TCH and completed chemotherapy on 06-22-15.  Plans to continue Herceptin for a total of 1 year.  She plans to be at the beach w/ family from July 16-23.  Lymphedema issues, if any:  No  Pain issues, if any:  No  SAFETY ISSUES:  Prior radiation? No  Pacemaker/ICD? No  Possible current pregnancy? No  Is the patient on methotrexate? No            ALLERGIES:  has No Known Allergies.  Meds: Current Outpatient Prescriptions  Medication Sig Dispense Refill  . ALPRAZolam (XANAX) 0.25 MG tablet Take 0.25-0.5 mg by mouth 2 (two) times daily as needed for anxiety.    . Calcium Carbonate-Vitamin D (CALCIUM 600 + D PO) Take 1 tablet by mouth daily.     . Cholecalciferol (VITAMIN D) 2000 UNITS tablet Take 2,000 Units by mouth daily.      . CRESTOR 20 MG tablet Take 1 tablet by mouth daily.  0  . lidocaine-prilocaine (EMLA) cream Apply to affected area once 30 g 3  . prochlorperazine (COMPAZINE) 10 MG tablet Take 1 tablet (10 mg total) by mouth every 6 (six) hours as needed (Nausea or vomiting). 30 tablet 1  . telmisartan (MICARDIS) 80 MG tablet Take 1 tablet (80 mg total) by mouth daily.    . zoledronic acid (RECLAST) 5 MG/100ML SOLN Inject 5 mg  into the vein once. annually     . LORazepam (ATIVAN) 0.5 MG tablet Take 1 tablet (0.5 mg total) by mouth at bedtime. (Patient not taking: Reported on 07/01/2015) 30 tablet 0  . tolterodine (DETROL LA) 4 MG 24 hr capsule Take 1 capsule (4 mg total) by mouth daily. (Patient not taking: Reported on 07/01/2015)     No current facility-administered medications for this encounter.    Physical Findings:  height is '5\' 8"'  (1.727 m) and weight is 124 lb 6.4 oz (56.427 kg). Her temperature is 97.9 F (36.6 C). Her blood pressure is 105/62 and her pulse is 84. .     General: Alert and oriented, in no acute distress HEENT: Head is normocephalic. Extraocular movements are intact. Oropharynx is clear. Neck: Neck is supple, no palpable cervical or supraclavicular lymphadenopathy. Heart: Regular in rate and rhythm with no murmurs, rubs, or gallops. Chest: Clear to auscultation bilaterally, with no rhonchi, wheezes, or rales. Abdomen: Soft, nontender, nondistended, with no rigidity or guarding. Extremities: No cyanosis or edema. Lymphatics: see Neck Exam Musculoskeletal: ambulatory Neurologic: No obvious focalities. Speech is fluent.  Psychiatric: Judgment and insight are intact. Affect is appropriate. Breast exam reveals well healed medial right breast lumpectomy scar with no palpable masses in either breast or in axillae  Lab Findings: Lab Results  Component Value  Date   WBC 5.1 06/22/2015   HGB 9.6* 06/22/2015   HCT 29.6* 06/22/2015   MCV 107.2* 06/22/2015   PLT 136* 06/22/2015      Radiographic Findings: No results found.  Impression/Plan: Stage II right breast cancer  We discussed adjuvant radiotherapy today.  I recommend right breast radiotherapy over 6 weeks in order to reduce chance of locoregional recurrence by 2/3.  The risks, benefits and side effects of this treatment were discussed in detail.  She understands that radiotherapy is associated with skin irritation and fatigue in the  acute setting. Late effects can include cosmetic changes and rare injury to internal organs.   She is enthusiastic about proceeding with treatment. A consent form has been  signed and placed in her chart.  Will simulate in July prior to her vacation and start RT upon her return.  _____________________________________   Eppie Gibson, MD

## 2015-07-01 NOTE — Addendum Note (Signed)
Encounter addended by: Ernst Spell, RN on: 07/01/2015  1:12 PM<BR>     Documentation filed: Charges VN

## 2015-07-13 ENCOUNTER — Ambulatory Visit (HOSPITAL_BASED_OUTPATIENT_CLINIC_OR_DEPARTMENT_OTHER): Payer: Medicare Other

## 2015-07-13 ENCOUNTER — Other Ambulatory Visit: Payer: Medicare Other

## 2015-07-13 VITALS — BP 122/75 | HR 74 | Temp 98.0°F | Resp 16

## 2015-07-13 DIAGNOSIS — C50311 Malignant neoplasm of lower-inner quadrant of right female breast: Secondary | ICD-10-CM | POA: Diagnosis not present

## 2015-07-13 DIAGNOSIS — Z5112 Encounter for antineoplastic immunotherapy: Secondary | ICD-10-CM

## 2015-07-13 MED ORDER — HEPARIN SOD (PORK) LOCK FLUSH 100 UNIT/ML IV SOLN
500.0000 [IU] | Freq: Once | INTRAVENOUS | Status: AC | PRN
  Administered 2015-07-13: 500 [IU]
  Filled 2015-07-13: qty 5

## 2015-07-13 MED ORDER — ACETAMINOPHEN 325 MG PO TABS
650.0000 mg | ORAL_TABLET | Freq: Once | ORAL | Status: AC
  Administered 2015-07-13: 650 mg via ORAL

## 2015-07-13 MED ORDER — DIPHENHYDRAMINE HCL 25 MG PO CAPS
50.0000 mg | ORAL_CAPSULE | Freq: Once | ORAL | Status: AC
  Administered 2015-07-13: 50 mg via ORAL

## 2015-07-13 MED ORDER — DIPHENHYDRAMINE HCL 25 MG PO CAPS
ORAL_CAPSULE | ORAL | Status: AC
  Filled 2015-07-13: qty 2

## 2015-07-13 MED ORDER — SODIUM CHLORIDE 0.9 % IV SOLN
Freq: Once | INTRAVENOUS | Status: AC
  Administered 2015-07-13: 11:00:00 via INTRAVENOUS

## 2015-07-13 MED ORDER — TRASTUZUMAB CHEMO INJECTION 440 MG
6.0000 mg/kg | Freq: Once | INTRAVENOUS | Status: AC
  Administered 2015-07-13: 378 mg via INTRAVENOUS
  Filled 2015-07-13: qty 18

## 2015-07-13 MED ORDER — ACETAMINOPHEN 325 MG PO TABS
ORAL_TABLET | ORAL | Status: AC
  Filled 2015-07-13: qty 2

## 2015-07-13 MED ORDER — SODIUM CHLORIDE 0.9% FLUSH
10.0000 mL | INTRAVENOUS | Status: DC | PRN
  Administered 2015-07-13: 10 mL
  Filled 2015-07-13: qty 10

## 2015-07-13 NOTE — Patient Instructions (Signed)
[facility] Discharge Instructions for Patients Receiving Chemotherapy  Today you received the following chemotherapy agents herceptin   To help prevent nausea and vomiting after your treatment, we encourage you to take your nausea medication as directed   If you develop nausea and vomiting that is not controlled by your nausea medication, call the clinic.   BELOW ARE SYMPTOMS THAT SHOULD BE REPORTED IMMEDIATELY:  *FEVER GREATER THAN 100.5 F  *CHILLS WITH OR WITHOUT FEVER  NAUSEA AND VOMITING THAT IS NOT CONTROLLED WITH YOUR NAUSEA MEDICATION  *UNUSUAL SHORTNESS OF BREATH  *UNUSUAL BRUISING OR BLEEDING  TENDERNESS IN MOUTH AND THROAT WITH OR WITHOUT PRESENCE OF ULCERS  *URINARY PROBLEMS  *BOWEL PROBLEMS  UNUSUAL RASH Items with * indicate a potential emergency and should be followed up as soon as possible.  Feel free to call the clinic you have any questions or concerns. The clinic phone number is [phone].  

## 2015-07-14 ENCOUNTER — Encounter: Payer: Self-pay | Admitting: *Deleted

## 2015-07-14 NOTE — Progress Notes (Signed)
[facility] Psychosocial Distress Screening Clinical Social Work  Clinical Social Work was referred by distress screening protocol.  The patient scored a 5 on the Psychosocial Distress Thermometer which indicates moderate distress. Clinical Social Worker contacted patient at home to assess for distress and other psychosocial needs.  Patient stated she was doing well and not experiencing any distress at this time.  CSW provided information on the support team and support services at [facility].  Patient expressed interest in exercise programs.  CSW provided information on Tai Chi and yoga at [facility] and the [facility] program.  CSW encouraged patient to call with any additional questions or concerns.      ONCBCN DISTRESS SCREENING 07/01/2015  Screening Type Initial Screening  Distress experienced in past week (1-10) 5  Emotional problem type   Physical Problem type Nausea/vomiting;Loss of appetitie;Constipation/diarrhea  Physician notified of physical symptoms   Referral to clinical psychology   Referral to clinical social work   Referral to dietition   Referral to financial advocate   Referral to support programs   Referral to palliative care    Johnnye Lana, MSW, LCSW, OSW-C Clinical Social Worker [place] [phone]

## 2015-07-15 DIAGNOSIS — Z79899 Other long term (current) drug therapy: Secondary | ICD-10-CM | POA: Diagnosis not present

## 2015-07-15 DIAGNOSIS — Z681 Body mass index (BMI) 19 or less, adult: Secondary | ICD-10-CM | POA: Diagnosis not present

## 2015-07-15 DIAGNOSIS — E559 Vitamin D deficiency, unspecified: Secondary | ICD-10-CM | POA: Diagnosis not present

## 2015-07-15 DIAGNOSIS — M810 Age-related osteoporosis without current pathological fracture: Secondary | ICD-10-CM | POA: Diagnosis not present

## 2015-07-15 DIAGNOSIS — R609 Edema, unspecified: Secondary | ICD-10-CM | POA: Diagnosis not present

## 2015-07-24 ENCOUNTER — Other Ambulatory Visit: Payer: Self-pay

## 2015-07-24 ENCOUNTER — Other Ambulatory Visit (HOSPITAL_COMMUNITY): Payer: Self-pay | Admitting: *Deleted

## 2015-07-24 NOTE — Progress Notes (Signed)
Received VM from pt stating she needed to reschedule appointment.  Pt states she had to reschedule her next Herceptin treatment which is scheduled for 7/24 and thus needs to adjust the following treatment to match every 3 week treatment schedule.  Notified pt that I would notify our scheduling department who would contact her with specific date and time once appointment scheduled.  POF entered and pt without further questions or concerns at time of call.

## 2015-07-27 ENCOUNTER — Ambulatory Visit (HOSPITAL_COMMUNITY)
Admission: RE | Admit: 2015-07-27 | Discharge: 2015-07-27 | Disposition: A | Discharge status: Home / Self Care | Payer: Medicare Other | Source: Ambulatory Visit | Attending: Internal Medicine | Admitting: Internal Medicine

## 2015-07-27 DIAGNOSIS — M810 Age-related osteoporosis without current pathological fracture: Secondary | ICD-10-CM | POA: Diagnosis not present

## 2015-07-27 MED ORDER — ZOLEDRONIC ACID 5 MG/100ML IV SOLN
INTRAVENOUS | Status: AC
  Administered 2015-07-27: 5 mg
  Filled 2015-07-27: qty 100

## 2015-07-27 MED ORDER — SODIUM CHLORIDE 0.9 % IV SOLN
Freq: Once | INTRAVENOUS | Status: AC
  Administered 2015-07-27: 11:00:00 via INTRAVENOUS

## 2015-07-27 MED ORDER — ZOLEDRONIC ACID 5 MG/100ML IV SOLN: 5.0000 mg | Freq: Once | INTRAVENOUS | Status: DC

## 2015-07-29 ENCOUNTER — Ambulatory Visit
Admission: RE | Admit: 2015-07-29 | Discharge: 2015-07-29 | Disposition: A | Discharge status: Home / Self Care | Payer: Medicare Other | Source: Ambulatory Visit | Attending: Radiation Oncology | Admitting: Radiation Oncology

## 2015-07-29 DIAGNOSIS — C50311 Malignant neoplasm of lower-inner quadrant of right female breast: Secondary | ICD-10-CM

## 2015-07-29 NOTE — Progress Notes (Signed)
  Radiation Oncology         [phone]) [phone] ________________________________  Name: Shawna [name] MRN: [id number]  Date: 07/29/2015  DOB: 1940/03/13  SIMULATION AND TREATMENT PLANNING NOTE    Outpatient  DIAGNOSIS:     ICD-9-CM ICD-10-CM   1. Breast cancer of lower-inner quadrant of right female breast ([place]) 174.3 C50.311     NARRATIVE:  The patient was brought to the [facility] [facility].  Identity was confirmed.  All relevant records and images related to the planned course of therapy were reviewed.  The patient freely provided informed written consent to proceed with treatment after reviewing the details related to the planned course of therapy. The consent form was witnessed and verified by the simulation staff.    Then, the patient was set-up in a stable reproducible supine position for radiation therapy with her ipsilateral arm over her head, and her upper body secured in a custom-made Vac-lok device.  CT images were obtained.  Surface markings were placed.  The CT images were loaded into the planning software.    TREATMENT PLANNING NOTE: Treatment planning then occurred.  The radiation prescription was entered and confirmed.     A total of 3 medically necessary complex treatment devices were fabricated and supervised by me: 2 fields with MLCs for custom blocks to protect heart, and lungs;  and, a Vac-lok. MORE COMPLEX DEVICES MAY BE MADE IN DOSIMETRY FOR FIELD IN FIELD BEAMS FOR DOSE HOMOGENEITY.  I have requested : 3D Simulation  I have requested a DVH of the following structures: lungs, heart, lumpectomy cavity.    The patient will receive 50 Gy in 25 fractions to the right breast with 2 tangential fields.   This will  be followed by a boost.  Optical Surface Tracking Plan:  Since intensity modulated radiotherapy (IMRT) and 3D conformal radiation treatment methods are predicated on accurate and precise positioning for treatment, intrafraction motion monitoring is medically  necessary to ensure accurate and safe treatment delivery. The ability to quantify intrafraction motion without excessive ionizing radiation dose can only be performed with optical surface tracking. Accordingly, surface imaging offers the opportunity to obtain 3D measurements of patient position throughout IMRT and 3D treatments without excessive radiation exposure. I am ordering optical surface tracking for this patient's upcoming course of radiotherapy.  ________________________________   Reference:  Ursula Alert, J, et al. Surface imaging-based analysis of intrafraction motion for breast radiotherapy patients.Journal of [place], n. 6, nov. 2014. ISSN [id number].  Available at: <http://www.jacmp.org/index.php/jacmp/article/view/4957>.    -----------------------------------  Eppie Gibson, MD

## 2015-07-30 DIAGNOSIS — I10 Essential (primary) hypertension: Secondary | ICD-10-CM | POA: Diagnosis not present

## 2015-07-30 DIAGNOSIS — C50311 Malignant neoplasm of lower-inner quadrant of right female breast: Secondary | ICD-10-CM | POA: Diagnosis not present

## 2015-07-30 DIAGNOSIS — E559 Vitamin D deficiency, unspecified: Secondary | ICD-10-CM | POA: Diagnosis not present

## 2015-07-30 DIAGNOSIS — R609 Edema, unspecified: Secondary | ICD-10-CM | POA: Diagnosis not present

## 2015-07-30 DIAGNOSIS — Z681 Body mass index (BMI) 19 or less, adult: Secondary | ICD-10-CM | POA: Diagnosis not present

## 2015-07-31 DIAGNOSIS — C50311 Malignant neoplasm of lower-inner quadrant of right female breast: Secondary | ICD-10-CM | POA: Diagnosis not present

## 2015-08-03 ENCOUNTER — Other Ambulatory Visit: Payer: Medicare Other

## 2015-08-03 ENCOUNTER — Ambulatory Visit: Payer: Medicare Other

## 2015-08-03 DIAGNOSIS — C50311 Malignant neoplasm of lower-inner quadrant of right female breast: Secondary | ICD-10-CM | POA: Diagnosis not present

## 2015-08-10 ENCOUNTER — Ambulatory Visit (HOSPITAL_BASED_OUTPATIENT_CLINIC_OR_DEPARTMENT_OTHER): Payer: Medicare Other

## 2015-08-10 ENCOUNTER — Ambulatory Visit (HOSPITAL_BASED_OUTPATIENT_CLINIC_OR_DEPARTMENT_OTHER): Payer: Medicare Other | Admitting: Hematology and Oncology

## 2015-08-10 ENCOUNTER — Ambulatory Visit
Admission: RE | Admit: 2015-08-10 | Discharge: 2015-08-10 | Disposition: A | Discharge status: Home / Self Care | Payer: Medicare Other | Source: Ambulatory Visit | Attending: Radiation Oncology | Admitting: Radiation Oncology

## 2015-08-10 ENCOUNTER — Encounter: Payer: Self-pay | Admitting: Hematology and Oncology

## 2015-08-10 ENCOUNTER — Telehealth: Payer: Self-pay | Admitting: Hematology and Oncology

## 2015-08-10 ENCOUNTER — Other Ambulatory Visit (HOSPITAL_BASED_OUTPATIENT_CLINIC_OR_DEPARTMENT_OTHER): Payer: Medicare Other

## 2015-08-10 DIAGNOSIS — D6481 Anemia due to antineoplastic chemotherapy: Secondary | ICD-10-CM

## 2015-08-10 DIAGNOSIS — R2240 Localized swelling, mass and lump, unspecified lower limb: Secondary | ICD-10-CM

## 2015-08-10 DIAGNOSIS — C50311 Malignant neoplasm of lower-inner quadrant of right female breast: Secondary | ICD-10-CM

## 2015-08-10 DIAGNOSIS — Z171 Estrogen receptor negative status [ER-]: Secondary | ICD-10-CM | POA: Diagnosis not present

## 2015-08-10 DIAGNOSIS — Z5112 Encounter for antineoplastic immunotherapy: Secondary | ICD-10-CM

## 2015-08-10 DIAGNOSIS — G629 Polyneuropathy, unspecified: Secondary | ICD-10-CM

## 2015-08-10 LAB — CBC WITH DIFFERENTIAL/PLATELET
BASO%: 0.6 % (ref 0.0–2.0)
Basophils Absolute: 0 10*3/uL (ref 0.0–0.1)
EOS%: 3.1 % (ref 0.0–7.0)
Eosinophils Absolute: 0.1 10*3/uL (ref 0.0–0.5)
HCT: 32.1 % — ABNORMAL LOW (ref 34.8–46.6)
HGB: 10.6 g/dL — ABNORMAL LOW (ref 11.6–15.9)
LYMPH%: 18.1 % (ref 14.0–49.7)
MCH: 36.3 pg — ABNORMAL HIGH (ref 25.1–34.0)
MCHC: 33 g/dL (ref 31.5–36.0)
MCV: 109.9 fL — ABNORMAL HIGH (ref 79.5–101.0)
MONO#: 0.1 10*3/uL (ref 0.1–0.9)
MONO%: 4.4 % (ref 0.0–14.0)
NEUT%: 73.8 % (ref 38.4–76.8)
NEUTROS ABS: 2.4 10*3/uL (ref 1.5–6.5)
PLATELETS: 137 10*3/uL — AB (ref 145–400)
RBC: 2.92 10*6/uL — AB (ref 3.70–5.45)
RDW: 13.5 % (ref 11.2–14.5)
WBC: 3.2 10*3/uL — AB (ref 3.9–10.3)
lymph#: 0.6 10*3/uL — ABNORMAL LOW (ref 0.9–3.3)

## 2015-08-10 LAB — COMPREHENSIVE METABOLIC PANEL
ALT: 51 U/L (ref 0–55)
ANION GAP: 9 meq/L (ref 3–11)
AST: 49 U/L — AB (ref 5–34)
Albumin: 3.5 g/dL (ref 3.5–5.0)
Alkaline Phosphatase: 125 U/L (ref 40–150)
BILIRUBIN TOTAL: 0.62 mg/dL (ref 0.20–1.20)
BUN: 20.5 mg/dL (ref 7.0–26.0)
CHLORIDE: 108 meq/L (ref 98–109)
CO2: 23 meq/L (ref 22–29)
CREATININE: 0.9 mg/dL (ref 0.6–1.1)
Calcium: 9 mg/dL (ref 8.4–10.4)
EGFR: 62 mL/min/{1.73_m2} — ABNORMAL LOW (ref >90)
GLUCOSE: 85 mg/dL (ref 70–140)
Potassium: 3.9 mEq/L (ref 3.5–5.1)
SODIUM: 140 meq/L (ref 136–145)
TOTAL PROTEIN: 5.9 g/dL — AB (ref 6.4–8.3)

## 2015-08-10 MED ORDER — GABAPENTIN 300 MG PO CAPS: 300.0000 mg | ORAL_CAPSULE | Freq: Every day | ORAL | 6 refills | Status: DC

## 2015-08-10 MED ORDER — DIPHENHYDRAMINE HCL 25 MG PO CAPS
50.0000 mg | ORAL_CAPSULE | Freq: Once | ORAL | Status: AC
  Administered 2015-08-10: 50 mg via ORAL

## 2015-08-10 MED ORDER — HEPARIN SOD (PORK) LOCK FLUSH 100 UNIT/ML IV SOLN
500.0000 [IU] | Freq: Once | INTRAVENOUS | Status: AC | PRN
  Administered 2015-08-10: 500 [IU]
  Filled 2015-08-10: qty 5

## 2015-08-10 MED ORDER — SODIUM CHLORIDE 0.9% FLUSH
10.0000 mL | INTRAVENOUS | Status: DC | PRN
  Administered 2015-08-10: 10 mL
  Filled 2015-08-10: qty 10

## 2015-08-10 MED ORDER — ACETAMINOPHEN 325 MG PO TABS
650.0000 mg | ORAL_TABLET | Freq: Once | ORAL | Status: AC
  Administered 2015-08-10: 650 mg via ORAL

## 2015-08-10 MED ORDER — SODIUM CHLORIDE 0.9 % IV SOLN
Freq: Once | INTRAVENOUS | Status: AC
  Administered 2015-08-10: 11:00:00 via INTRAVENOUS

## 2015-08-10 MED ORDER — TRASTUZUMAB CHEMO 150 MG IV SOLR
6.0000 mg/kg | Freq: Once | INTRAVENOUS | Status: AC
  Administered 2015-08-10: 378 mg via INTRAVENOUS
  Filled 2015-08-10: qty 18

## 2015-08-10 MED ORDER — GABAPENTIN 100 MG PO CAPS: 100.0000 mg | ORAL_CAPSULE | Freq: Every day | ORAL | 0 refills | Status: DC

## 2015-08-10 NOTE — Progress Notes (Signed)
Patient Care Team: [name], MD as PCP - General (Internal Medicine) [name], MD as Consulting Physician (General Surgery) [name], MD as Consulting Physician (Hematology and Oncology)  DIAGNOSIS: No matching staging information was found for the patient.  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 She is here today for Herceptin maintenance. She has had no problems tolerating Herceptin. Her major complaint is leg swelling that started with the last cycle of chemotherapy. Her blood pressure medications have been adjusted. Her symptoms of the leg swelling have not completely resolved. 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 She has not been stumbling or falling.  REVIEW OF SYSTEMS:   Constitutional: Denies fevers, chills or abnormal weight loss Eyes: Denies blurriness of vision Ears, nose, mouth, throat, and face: Denies mucositis or sore throat Respiratory: Denies cough, dyspnea  or wheezes Cardiovascular: Denies palpitation, chest discomfort Gastrointestinal:  Denies nausea, heartburn or change in bowel habits Skin: Denies abnormal skin rashes Lymphatics: Denies new lymphadenopathy or easy bruising Neurological: Neuropathy in the feet Behavioral/Psych: Mood is stable, no new changes  Extremities: lower extremity edema  All other systems were reviewed with the patient and are negative.  I have reviewed the past medical history, past surgical history, social history and family history with the patient and they are unchanged from previous note.  ALLERGIES:  has No Known Allergies.  MEDICATIONS:  Current Outpatient Prescriptions  Medication Sig Dispense Refill  . ALPRAZolam (XANAX) 0.25 MG tablet Take 0.25-0.5 mg by mouth 2 (two) times daily as needed for anxiety.    . Calcium Carbonate-Vitamin D (CALCIUM 600 + D PO) Take 1 tablet by mouth daily.     . Cholecalciferol (VITAMIN D) 2000 UNITS tablet Take 2,000 Units by mouth daily.      . CRESTOR 20 MG tablet Take 1 tablet by mouth daily.  0  . hydrochlorothiazide (MICROZIDE) 12.5 MG capsule Take 12.5 mg by mouth daily.    Marland Kitchen lidocaine-prilocaine (EMLA) cream Apply to affected area once 30 g 3  . LORazepam (ATIVAN) 0.5 MG tablet Take 1 tablet (0.5 mg total) by mouth at bedtime. (Patient not taking: Reported on 07/01/2015) 30 tablet 0  . prochlorperazine (COMPAZINE) 10 MG tablet Take 1 tablet (10 mg total) by mouth every 6 (six) hours as needed (Nausea or vomiting). 30 tablet 1  . telmisartan (MICARDIS) 80 MG tablet Take 1 tablet (80 mg total) by mouth daily.    Marland Kitchen  tolterodine (DETROL LA) 4 MG 24 hr capsule Take 1 capsule (4 mg total) by mouth daily. (Patient not taking: Reported on 07/01/2015)    . Vitamin D, Ergocalciferol, (DRISDOL) 50000 units CAPS capsule Take 50,000 Units by mouth 2 (two) times a week. Monday and Thursday    . zoledronic acid (RECLAST) 5 MG/100ML SOLN Inject 5 mg into the vein once. annually       No current facility-administered medications for this visit.     PHYSICAL EXAMINATION: ECOG PERFORMANCE STATUS: 1 - Symptomatic but completely ambulatory  Vitals:   08/10/15 0858  BP: 133/86  Pulse: 76  Resp: 19  Temp: 98.7 F (37.1 C)   Filed Weights   08/10/15 0858  Weight: 129 lb (58.5 kg)    GENERAL:alert, no distress and comfortable SKIN: skin color, texture, turgor are normal, no rashes or significant lesions EYES: normal, Conjunctiva are pink and non-injected, sclera clear OROPHARYNX:no exudate, no erythema and lips, buccal mucosa, and tongue normal  NECK: supple, thyroid normal size, non-tender, without nodularity LYMPH:  no palpable lymphadenopathy in the cervical, axillary or inguinal LUNGS: clear to auscultation and percussion with normal breathing effort HEART: regular rate & rhythm and no murmurs and no lower extremity edema ABDOMEN:abdomen soft, non-tender and normal bowel sounds MUSCULOSKELETAL:no cyanosis of digits and no clubbing  NEURO: alert & oriented x 3 with fluent speech, 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 EXTREMITIES: No lower extremity edema  LABORATORY DATA:  I have reviewed the data as listed   Chemistry      Component Value Date/Time   NA 140 08/10/2015 0850   K 3.9 08/10/2015 0850   CL 108 01/29/2015 1545   CO2 23 08/10/2015 0850   BUN 20.5 08/10/2015 0850   CREATININE 0.9 08/10/2015 0850      Component Value Date/Time   CALCIUM 9.0 08/10/2015 0850   ALKPHOS 125 08/10/2015 0850   AST 49 (H) 08/10/2015 0850   ALT 51 08/10/2015 0850   BILITOT 0.62 08/10/2015 0850       Lab Results  Component Value Date   WBC 3.2 (L) 08/10/2015   HGB 10.6 (L) 08/10/2015   HCT 32.1 (L) 08/10/2015   MCV 109.9 (H) 08/10/2015   PLT 137 (L) 08/10/2015   NEUTROABS 2.4 08/10/2015     ASSESSMENT & PLAN:  Breast cancer of lower-inner quadrant of right female breast ([place]) Right lumpectomy 02/03/2015: Invasive ductal carcinoma  grade 2, 2.1 cm, with associated DCIS, margins are negative, 0/1 lymph node, ER 0%, PR 0%, HER-2 positive ratio 2.31, T2 N0 stage II a. 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 Adjuvant chemotherapy with TCH 6 cycles started 03/09/15 completed 06/22/15 now on Herceptin maintenance for 1 year  2. Followed by radiation therapy -------------------------------------------------------------------------------------------------------------------------------------------------------- Current Treatment: Herceptin maintenance ECHO 4/17: EF 65-70% (patient will need new echo) Monitoring closely for toxicities I reviewed her blood work.  Leg swelling: I will get an ultrasound of the legs to evaluate and rule out DVT. Patient's primary care physician is helping her adjust her blood pressure medications and treat the leg swelling. 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 We will have to  closely watch and monitor her neuropathy symptoms. Nail changes: Fever the nails are healing on the nails actually came off yesterday on her toes. These are related to prior chemotherapy.  RTC in 3 weeks for Herceptin maintenance in 6 weeks for follow-up  No orders of the defined types were placed in this encounter.  The patient has a good understanding of the overall plan. she agrees with it. she will call with any problems that may develop before the next visit here.   Rulon Eisenmenger, MD 08/10/15

## 2015-08-10 NOTE — Telephone Encounter (Signed)
appt made and avs printed °

## 2015-08-10 NOTE — Assessment & Plan Note (Addendum)
Right lumpectomy 02/03/2015: Invasive ductal carcinoma grade 2, 2.1 cm, with associated DCIS, margins are negative, 0/1 lymph node, ER 0%, PR 0%, HER-2 positive ratio 2.31, T2 N0 stage II a. 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 Adjuvant chemotherapy with TCH 6 cycles started 03/09/15 completed 06/22/15 now on Herceptin maintenance for 1 year  2. Followed by radiation therapy -------------------------------------------------------------------------------------------------------------------------------------------------------- Current Treatment: Herceptin maintenance ECHO 4/17: EF 65-70% (patient will need new echo) Monitoring closely for toxicities I reviewed her blood work.  RTC in 3 weeks for Herceptin maintenance in 6 weeks for follow-up

## 2015-08-10 NOTE — Patient Instructions (Signed)
[facility] Discharge Instructions for Patients Receiving Chemotherapy  Today you received the following chemotherapy agents:  Herceptin  To help prevent nausea and vomiting after your treatment, we encourage you to take your nausea medication as prescribed.   If you develop nausea and vomiting that is not controlled by your nausea medication, call the clinic.   BELOW ARE SYMPTOMS THAT SHOULD BE REPORTED IMMEDIATELY:  *FEVER GREATER THAN 100.5 F  *CHILLS WITH OR WITHOUT FEVER  NAUSEA AND VOMITING THAT IS NOT CONTROLLED WITH YOUR NAUSEA MEDICATION  *UNUSUAL SHORTNESS OF BREATH  *UNUSUAL BRUISING OR BLEEDING  TENDERNESS IN MOUTH AND THROAT WITH OR WITHOUT PRESENCE OF ULCERS  *URINARY PROBLEMS  *BOWEL PROBLEMS  UNUSUAL RASH Items with * indicate a potential emergency and should be followed up as soon as possible.  Feel free to call the clinic you have any questions or concerns. The clinic phone number is [phone].  Please show the CHEMO ALERT CARD at check-in to the Emergency Department and triage nurse.   

## 2015-08-11 ENCOUNTER — Ambulatory Visit (HOSPITAL_COMMUNITY)
Admission: RE | Admit: 2015-08-11 | Discharge: 2015-08-11 | Disposition: A | Discharge status: Home / Self Care | Payer: Medicare Other | Source: Ambulatory Visit | Attending: Hematology and Oncology | Admitting: Hematology and Oncology

## 2015-08-11 ENCOUNTER — Ambulatory Visit
Admission: RE | Admit: 2015-08-11 | Discharge: 2015-08-11 | Disposition: A | Discharge status: Home / Self Care | Payer: Medicare Other | Source: Ambulatory Visit | Attending: Radiation Oncology | Admitting: Radiation Oncology

## 2015-08-11 ENCOUNTER — Telehealth: Payer: Self-pay

## 2015-08-11 DIAGNOSIS — M7989 Other specified soft tissue disorders: Secondary | ICD-10-CM | POA: Insufficient documentation

## 2015-08-11 DIAGNOSIS — C50311 Malignant neoplasm of lower-inner quadrant of right female breast: Secondary | ICD-10-CM | POA: Insufficient documentation

## 2015-08-11 NOTE — Progress Notes (Signed)
VASCULAR LAB PRELIMINARY  PRELIMINARY  PRELIMINARY  PRELIMINARY  Bilateral lower extremity venous duplex has been completed.    Bilateral:  No evidence of DVT, superficial thrombosis, or Baker's Cyst.  Called Dr. Geralyn Flash nurse left message @10 :00 am.  Janifer Adie, RVT, RDMS 08/11/2015, 10:02 AM

## 2015-08-11 NOTE — Telephone Encounter (Signed)
Msg left by Tammy - Vascular lab, negative results on bilateral LE doppler.  Routed to [facility]

## 2015-08-12 ENCOUNTER — Ambulatory Visit
Admission: RE | Admit: 2015-08-12 | Discharge: 2015-08-12 | Disposition: A | Discharge status: Home / Self Care | Payer: Medicare Other | Source: Ambulatory Visit | Attending: Radiation Oncology | Admitting: Radiation Oncology

## 2015-08-12 DIAGNOSIS — C50311 Malignant neoplasm of lower-inner quadrant of right female breast: Secondary | ICD-10-CM | POA: Diagnosis not present

## 2015-08-13 ENCOUNTER — Ambulatory Visit
Admission: RE | Admit: 2015-08-13 | Discharge: 2015-08-13 | Disposition: A | Discharge status: Home / Self Care | Payer: Medicare Other | Source: Ambulatory Visit | Attending: Radiation Oncology | Admitting: Radiation Oncology

## 2015-08-13 ENCOUNTER — Encounter (HOSPITAL_COMMUNITY): Payer: Medicare Other | Admitting: Internal Medicine

## 2015-08-13 ENCOUNTER — Ambulatory Visit (HOSPITAL_COMMUNITY): Payer: Medicare Other

## 2015-08-13 DIAGNOSIS — C50311 Malignant neoplasm of lower-inner quadrant of right female breast: Secondary | ICD-10-CM | POA: Diagnosis not present

## 2015-08-13 MED ORDER — ALRA NON-METALLIC DEODORANT (RAD-ONC)
1.0000 "application " | Freq: Once | TOPICAL | Status: AC
  Administered 2015-08-13: 1 via TOPICAL

## 2015-08-13 MED ORDER — RADIAPLEXRX EX GEL
Freq: Once | CUTANEOUS | Status: AC
  Administered 2015-08-13: 10:00:00 via TOPICAL

## 2015-08-13 NOTE — Progress Notes (Signed)

## 2015-08-14 ENCOUNTER — Ambulatory Visit
Admission: RE | Admit: 2015-08-14 | Discharge: 2015-08-14 | Disposition: A | Discharge status: Home / Self Care | Payer: Medicare Other | Source: Ambulatory Visit | Attending: Radiation Oncology | Admitting: Radiation Oncology

## 2015-08-14 DIAGNOSIS — C50311 Malignant neoplasm of lower-inner quadrant of right female breast: Secondary | ICD-10-CM | POA: Diagnosis not present

## 2015-08-17 ENCOUNTER — Ambulatory Visit
Admission: RE | Admit: 2015-08-17 | Discharge: 2015-08-17 | Disposition: A | Discharge status: Home / Self Care | Payer: Medicare Other | Source: Ambulatory Visit | Attending: Radiation Oncology | Admitting: Radiation Oncology

## 2015-08-17 ENCOUNTER — Encounter: Payer: Self-pay | Admitting: Radiation Oncology

## 2015-08-17 VITALS — BP 116/70 | HR 70 | Temp 97.7°F | Ht 69.0 in | Wt 127.4 lb

## 2015-08-17 DIAGNOSIS — C50311 Malignant neoplasm of lower-inner quadrant of right female breast: Secondary | ICD-10-CM | POA: Diagnosis not present

## 2015-08-17 NOTE — Progress Notes (Signed)
   Weekly Management Note:  outpatient    ICD-9-CM ICD-10-CM   1. Breast cancer of lower-inner quadrant of right female breast ([facility]) 174.3 C50.311     Current Dose:  10 Gy  Projected Dose: 60 Gy   Narrative:  The patient presents for routine under treatment assessment.  CBCT/MVCT images/Port film x-rays were reviewed.  The chart was checked. Doing well.  Physical Findings:  height is 5\' 9"  (1.753 m) and weight is 127 lb 6.4 oz (57.8 kg). Her temperature is 97.7 F (36.5 C). Her blood pressure is 116/70 and her pulse is 70.   Wt Readings from Last 3 Encounters:  08/17/15 127 lb 6.4 oz (57.8 kg)  08/10/15 129 lb (58.5 kg)  07/27/15 127 lb (57.6 kg)   No skin irritation over right breast  Impression:  The patient is tolerating radiotherapy.  Plan:  Continue radiotherapy as planned. Patient instructed to apply Radiaplex in treatment fields.      ________________________________   Eppie Gibson, M.D.

## 2015-08-17 NOTE — Progress Notes (Signed)
Shawna [name] presents for her 5th fraction of radiation to her Right Breast. She denies pain or fatigue. She denies any skin irritation at her Radiation site. She is using the Radiaplex cream as directed.   BP 116/70   Pulse 70   Temp 97.7 F (36.5 C)   Ht 5\' 9"  (1.753 m)   Wt 127 lb 6.4 oz (57.8 kg)   LMP 01/17/1982 (Approximate)   BMI 18.81 kg/m    Wt Readings from Last 3 Encounters:  08/17/15 127 lb 6.4 oz (57.8 kg)  08/10/15 129 lb (58.5 kg)  07/27/15 127 lb (57.6 kg)

## 2015-08-18 ENCOUNTER — Ambulatory Visit
Admission: RE | Admit: 2015-08-18 | Discharge: 2015-08-18 | Disposition: A | Discharge status: Home / Self Care | Payer: Medicare Other | Source: Ambulatory Visit | Attending: Radiation Oncology | Admitting: Radiation Oncology

## 2015-08-18 DIAGNOSIS — C50311 Malignant neoplasm of lower-inner quadrant of right female breast: Secondary | ICD-10-CM | POA: Diagnosis not present

## 2015-08-19 ENCOUNTER — Telehealth: Payer: Self-pay | Admitting: *Deleted

## 2015-08-19 ENCOUNTER — Ambulatory Visit
Admission: RE | Admit: 2015-08-19 | Discharge: 2015-08-19 | Disposition: A | Discharge status: Home / Self Care | Payer: Medicare Other | Source: Ambulatory Visit | Attending: Radiation Oncology | Admitting: Radiation Oncology

## 2015-08-19 DIAGNOSIS — C50311 Malignant neoplasm of lower-inner quadrant of right female breast: Secondary | ICD-10-CM | POA: Diagnosis not present

## 2015-08-19 NOTE — Telephone Encounter (Signed)
Spoke to pt to assess needs during xrt. Relate doing well and without complaints. Encourage pt to call with needs. Received verbal understanding.

## 2015-08-20 ENCOUNTER — Ambulatory Visit
Admission: RE | Admit: 2015-08-20 | Discharge: 2015-08-20 | Disposition: A | Discharge status: Home / Self Care | Payer: Medicare Other | Source: Ambulatory Visit | Attending: Radiation Oncology | Admitting: Radiation Oncology

## 2015-08-20 DIAGNOSIS — C50311 Malignant neoplasm of lower-inner quadrant of right female breast: Secondary | ICD-10-CM | POA: Diagnosis not present

## 2015-08-21 ENCOUNTER — Ambulatory Visit: Payer: Medicare Other

## 2015-08-21 ENCOUNTER — Ambulatory Visit: Admission: RE | Admit: 2015-08-21 | Payer: Medicare Other | Source: Ambulatory Visit

## 2015-08-21 ENCOUNTER — Ambulatory Visit
Admission: RE | Admit: 2015-08-21 | Discharge: 2015-08-21 | Disposition: A | Discharge status: Home / Self Care | Payer: Medicare Other | Source: Ambulatory Visit | Attending: Radiation Oncology | Admitting: Radiation Oncology

## 2015-08-24 ENCOUNTER — Encounter: Payer: Self-pay | Admitting: Radiation Oncology

## 2015-08-24 ENCOUNTER — Telehealth: Payer: Self-pay | Admitting: *Deleted

## 2015-08-24 ENCOUNTER — Ambulatory Visit: Payer: Medicare Other

## 2015-08-24 ENCOUNTER — Ambulatory Visit
Admission: RE | Admit: 2015-08-24 | Discharge: 2015-08-24 | Disposition: A | Discharge status: Home / Self Care | Payer: Medicare Other | Source: Ambulatory Visit | Attending: Radiation Oncology | Admitting: Radiation Oncology

## 2015-08-24 VITALS — BP 100/75 | HR 69 | Temp 97.7°F | Ht 69.0 in | Wt 124.7 lb

## 2015-08-24 DIAGNOSIS — C50311 Malignant neoplasm of lower-inner quadrant of right female breast: Secondary | ICD-10-CM | POA: Diagnosis not present

## 2015-08-24 NOTE — Progress Notes (Addendum)
Shawna [name] has completed 9 fractions to her right breast.  She reports having pain in her feet due to neuropathy.  She is taking gabapentin which is helping.  She denies having fatigue.  She is using radiaplex twice a day.  The skin on her right breast is intact.  BP 100/75 (BP Location: Left Arm, Patient Position: Sitting)   Pulse 69   Temp 97.7 F (36.5 C) (Oral)   Ht 5\' 9"  (1.753 m)   Wt 123 lb 6.4 oz (56 kg)   LMP 01/17/1982 (Approximate)   SpO2 100%   BMI 18.22 kg/m    Wt Readings from Last 3 Encounters:  08/24/15 124 lb 11.2 oz (56.6 kg)  08/17/15 127 lb 6.4 oz (57.8 kg)  08/10/15 129 lb (58.5 kg)

## 2015-08-24 NOTE — Telephone Encounter (Signed)
CALLED PATIENT TO INFORM OF NUTRITION APPT. FOR 08-28-15 @ 9:45 AM WITH BARBARA NEFF, LVM FOR A RETURN CALL

## 2015-08-24 NOTE — Progress Notes (Signed)
   Weekly Management Note:  outpatient    ICD-9-CM ICD-10-CM   1. Breast cancer of lower-inner quadrant of right female breast ([place]) 174.3 C50.311 Amb Referral to Nutrition and Diabetic E    Current Dose:  18 Gy  Projected Dose: 60 Gy   Narrative:  The patient presents for routine under treatment assessment.  CBCT/MVCT images/Port film x-rays were reviewed.  The chart was checked. Doing well. But, losing weight and eating well per her report. Weight loss started during chemo.   Physical Findings:  height is 5\' 9"  (1.753 m) and weight is 124 lb 11.2 oz (56.6 kg). Her oral temperature is 97.7 F (36.5 C). Her blood pressure is 100/75 and her pulse is 69. Her oxygen saturation is 100%.   Wt Readings from Last 3 Encounters:  08/24/15 124 lb 11.2 oz (56.6 kg)  08/17/15 127 lb 6.4 oz (57.8 kg)  08/10/15 129 lb (58.5 kg)   No significant signs of skin irritation over right breast  Impression:  The patient is tolerating radiotherapy.  Plan:  Continue radiotherapy as planned. Patient instructed to apply Radiaplex in treatment fields.  Will  refer to nutrition.  Discussed supplements with her today.   ________________________________   Eppie Gibson, M.D.

## 2015-08-25 ENCOUNTER — Ambulatory Visit
Admission: RE | Admit: 2015-08-25 | Discharge: 2015-08-25 | Disposition: A | Discharge status: Home / Self Care | Payer: Medicare Other | Source: Ambulatory Visit | Attending: Radiation Oncology | Admitting: Radiation Oncology

## 2015-08-25 DIAGNOSIS — C50311 Malignant neoplasm of lower-inner quadrant of right female breast: Secondary | ICD-10-CM | POA: Diagnosis not present

## 2015-08-26 ENCOUNTER — Ambulatory Visit
Admission: RE | Admit: 2015-08-26 | Discharge: 2015-08-26 | Disposition: A | Discharge status: Home / Self Care | Payer: Medicare Other | Source: Ambulatory Visit | Attending: Radiation Oncology | Admitting: Radiation Oncology

## 2015-08-26 DIAGNOSIS — C50311 Malignant neoplasm of lower-inner quadrant of right female breast: Secondary | ICD-10-CM | POA: Diagnosis not present

## 2015-08-27 ENCOUNTER — Ambulatory Visit
Admission: RE | Admit: 2015-08-27 | Discharge: 2015-08-27 | Disposition: A | Discharge status: Home / Self Care | Payer: Medicare Other | Source: Ambulatory Visit | Attending: Radiation Oncology | Admitting: Radiation Oncology

## 2015-08-27 DIAGNOSIS — C50311 Malignant neoplasm of lower-inner quadrant of right female breast: Secondary | ICD-10-CM | POA: Diagnosis not present

## 2015-08-28 ENCOUNTER — Ambulatory Visit: Payer: Medicare Other | Admitting: Nutrition

## 2015-08-28 ENCOUNTER — Ambulatory Visit
Admission: RE | Admit: 2015-08-28 | Discharge: 2015-08-28 | Disposition: A | Discharge status: Home / Self Care | Payer: Medicare Other | Source: Ambulatory Visit | Attending: Radiation Oncology | Admitting: Radiation Oncology

## 2015-08-28 DIAGNOSIS — C50311 Malignant neoplasm of lower-inner quadrant of right female breast: Secondary | ICD-10-CM | POA: Diagnosis not present

## 2015-08-28 NOTE — Progress Notes (Signed)
75 year old female diagnosed with breast cancer.  She is a patient of Dr. Lindi Adie and Dr. [name].  Past medical history includes osteoporosis, kidney stones, hypertension, and hyperlipidemia.  Medications include Xanax, calcium, vitamin D, Crestor, Ativan, and Compazine.  Labs include albumin 3.5 on July 24.  Height: 5 feet 9 inches. Weight: 124.7 pounds. Usual body weight: 140 pounds per patient. BMI: 18.41.  Patient reports she has good appetite and good oral intake.  She consumes 3 meals daily.  Patient denies nausea, vomiting, constipation or diarrhea. Patient endorses weight loss.  Nutrition diagnosis:  Unintended weight loss related to increased nutrient needs secondary to cancer diagnosis as evidenced by 11% weight loss from usual body weight.  Intervention:  Patient educated to add [employer] daily.  Educated her on how to add protein and calories to meals.  Provided fact sheets. Questions were answered.  Teach back method was used.  Monitoring, evaluation, goals:  Patient will increase oral intake to minimize further weight loss.  Next visit: Patient will contact me for any questions or concerns.  **Disclaimer: This note was dictated with voice recognition software. Similar sounding words can inadvertently be transcribed and this note may contain transcription errors which may not have been corrected upon publication of note.**

## 2015-08-31 ENCOUNTER — Ambulatory Visit
Admission: RE | Admit: 2015-08-31 | Discharge: 2015-08-31 | Disposition: A | Discharge status: Home / Self Care | Payer: Medicare Other | Source: Ambulatory Visit | Attending: Radiation Oncology | Admitting: Radiation Oncology

## 2015-08-31 ENCOUNTER — Ambulatory Visit (HOSPITAL_BASED_OUTPATIENT_CLINIC_OR_DEPARTMENT_OTHER): Payer: Medicare Other

## 2015-08-31 ENCOUNTER — Encounter: Payer: Self-pay | Admitting: Radiation Oncology

## 2015-08-31 ENCOUNTER — Other Ambulatory Visit (HOSPITAL_BASED_OUTPATIENT_CLINIC_OR_DEPARTMENT_OTHER): Payer: Medicare Other

## 2015-08-31 VITALS — BP 137/64 | HR 72 | Temp 98.1°F | Resp 18

## 2015-08-31 VITALS — BP 104/71 | HR 81 | Temp 97.8°F | Ht 69.0 in | Wt 125.7 lb

## 2015-08-31 DIAGNOSIS — Z5112 Encounter for antineoplastic immunotherapy: Secondary | ICD-10-CM

## 2015-08-31 DIAGNOSIS — C50311 Malignant neoplasm of lower-inner quadrant of right female breast: Secondary | ICD-10-CM | POA: Diagnosis not present

## 2015-08-31 LAB — CBC WITH DIFFERENTIAL/PLATELET
BASO%: 1.2 % (ref 0.0–2.0)
Basophils Absolute: 0 10*3/uL (ref 0.0–0.1)
EOS%: 3.7 % (ref 0.0–7.0)
Eosinophils Absolute: 0.1 10*3/uL (ref 0.0–0.5)
HEMATOCRIT: 35.2 % (ref 34.8–46.6)
HGB: 12.1 g/dL (ref 11.6–15.9)
LYMPH#: 0.5 10*3/uL — AB (ref 0.9–3.3)
LYMPH%: 18.9 % (ref 14.0–49.7)
MCH: 36 pg — ABNORMAL HIGH (ref 25.1–34.0)
MCHC: 34.3 g/dL (ref 31.5–36.0)
MCV: 105 fL — ABNORMAL HIGH (ref 79.5–101.0)
MONO#: 0.1 10*3/uL (ref 0.1–0.9)
MONO%: 4.2 % (ref 0.0–14.0)
NEUT%: 72 % (ref 38.4–76.8)
NEUTROS ABS: 1.8 10*3/uL (ref 1.5–6.5)
PLATELETS: 160 10*3/uL (ref 145–400)
RBC: 3.35 10*6/uL — ABNORMAL LOW (ref 3.70–5.45)
RDW: 12.3 % (ref 11.2–14.5)
WBC: 2.6 10*3/uL — AB (ref 3.9–10.3)

## 2015-08-31 LAB — COMPREHENSIVE METABOLIC PANEL
ALT: 92 U/L — AB (ref 0–55)
ANION GAP: 9 meq/L (ref 3–11)
AST: 56 U/L — ABNORMAL HIGH (ref 5–34)
Albumin: 3.6 g/dL (ref 3.5–5.0)
Alkaline Phosphatase: 282 U/L — ABNORMAL HIGH (ref 40–150)
BILIRUBIN TOTAL: 0.65 mg/dL (ref 0.20–1.20)
BUN: 21.1 mg/dL (ref 7.0–26.0)
CALCIUM: 9.8 mg/dL (ref 8.4–10.4)
CO2: 26 mEq/L (ref 22–29)
CREATININE: 0.9 mg/dL (ref 0.6–1.1)
Chloride: 105 mEq/L (ref 98–109)
EGFR: 60 mL/min/{1.73_m2} — ABNORMAL LOW (ref >90)
Glucose: 85 mg/dl (ref 70–140)
Potassium: 4.1 mEq/L (ref 3.5–5.1)
Sodium: 139 mEq/L (ref 136–145)
TOTAL PROTEIN: 6.9 g/dL (ref 6.4–8.3)

## 2015-08-31 MED ORDER — DIPHENHYDRAMINE HCL 25 MG PO CAPS
ORAL_CAPSULE | ORAL | Status: AC
  Filled 2015-08-31: qty 2

## 2015-08-31 MED ORDER — SODIUM CHLORIDE 0.9 % IV SOLN
Freq: Once | INTRAVENOUS | Status: AC
  Administered 2015-08-31: 11:00:00 via INTRAVENOUS

## 2015-08-31 MED ORDER — ACETAMINOPHEN 325 MG PO TABS
ORAL_TABLET | ORAL | Status: AC
  Filled 2015-08-31: qty 2

## 2015-08-31 MED ORDER — TRASTUZUMAB CHEMO 150 MG IV SOLR
6.0000 mg/kg | Freq: Once | INTRAVENOUS | Status: AC
  Administered 2015-08-31: 378 mg via INTRAVENOUS
  Filled 2015-08-31: qty 18

## 2015-08-31 MED ORDER — SODIUM CHLORIDE 0.9% FLUSH
10.0000 mL | INTRAVENOUS | Status: DC | PRN
  Administered 2015-08-31: 10 mL
  Filled 2015-08-31: qty 10

## 2015-08-31 MED ORDER — HEPARIN SOD (PORK) LOCK FLUSH 100 UNIT/ML IV SOLN
500.0000 [IU] | Freq: Once | INTRAVENOUS | Status: AC | PRN
  Administered 2015-08-31: 500 [IU]
  Filled 2015-08-31: qty 5

## 2015-08-31 MED ORDER — DIPHENHYDRAMINE HCL 25 MG PO CAPS
50.0000 mg | ORAL_CAPSULE | Freq: Once | ORAL | Status: AC
  Administered 2015-08-31: 50 mg via ORAL

## 2015-08-31 MED ORDER — ACETAMINOPHEN 325 MG PO TABS
650.0000 mg | ORAL_TABLET | Freq: Once | ORAL | Status: AC
  Administered 2015-08-31: 650 mg via ORAL

## 2015-08-31 NOTE — Patient Instructions (Signed)
[facility] Discharge Instructions for Patients Receiving Chemotherapy  Today you received the following chemotherapy agents:  Herceptin  To help prevent nausea and vomiting after your treatment, we encourage you to take your nausea medication as prescribed.   If you develop nausea and vomiting that is not controlled by your nausea medication, call the clinic.   BELOW ARE SYMPTOMS THAT SHOULD BE REPORTED IMMEDIATELY:  *FEVER GREATER THAN 100.5 F  *CHILLS WITH OR WITHOUT FEVER  NAUSEA AND VOMITING THAT IS NOT CONTROLLED WITH YOUR NAUSEA MEDICATION  *UNUSUAL SHORTNESS OF BREATH  *UNUSUAL BRUISING OR BLEEDING  TENDERNESS IN MOUTH AND THROAT WITH OR WITHOUT PRESENCE OF ULCERS  *URINARY PROBLEMS  *BOWEL PROBLEMS  UNUSUAL RASH Items with * indicate a potential emergency and should be followed up as soon as possible.  Feel free to call the clinic you have any questions or concerns. The clinic phone number is [phone].  Please show the CHEMO ALERT CARD at check-in to the Emergency Department and triage nurse.   

## 2015-08-31 NOTE — Progress Notes (Signed)
   Weekly Management Note:  outpatient    ICD-9-CM ICD-10-CM   1. Breast cancer of lower-inner quadrant of right female breast ([facility]) 174.3 C50.311     Current Dose:  28 Gy  Projected Dose: 60 Gy   Narrative:  The patient presents for routine under treatment assessment.  CBCT/MVCT images/Port film x-rays were reviewed.  The chart was checked.  She denies pain. She does report mild fatigue over the weekend. Persistent side effects since chemotherapy, will discuss w/ med onc. Herceptin today.   Physical Findings:  height is 5\' 9"  (1.753 m) and weight is 125 lb 11.2 oz (57 kg). Her temperature is 97.8 F (36.6 C). Her blood pressure is 104/71 and her pulse is 81.   Wt Readings from Last 3 Encounters:  08/31/15 125 lb 11.2 oz (57 kg)  08/24/15 124 lb 11.2 oz (56.6 kg)  08/17/15 127 lb 6.4 oz (57.8 kg)   Mild dermatitis over right breast  Impression:  The patient is tolerating radiotherapy.  Plan:  Continue radiotherapy as planned. Patient instructed to apply Radiaplex in treatment fields. hydrocortisone 1% cream prn itching.   ________________________________   Eppie Gibson, M.D.

## 2015-08-31 NOTE — Progress Notes (Signed)
Shawna [name] is here for her 14th fraction of radiation to her Right Breast. She denies pain. She does report mild fatigue over the weekend. Her Right Breast is normal appearing except for several areas of redness (rash) to the upper inner area of her Right Breast. She denies itching at this area. She is using the radiaplex cream twice daily as directed. She is concerned about sinus congestion, and wonders if she can use something for allergies. She also reports tingling/numbness in her feet, and states the neurontin has not been working well. She is scheduled to receive Herceptin today.  BP 104/71   Pulse 81   Temp 97.8 F (36.6 C)   Ht 5\' 9"  (1.753 m)   Wt 125 lb 11.2 oz (57 kg)   LMP 01/17/1982 (Approximate)   BMI 18.56 kg/m    Wt Readings from Last 3 Encounters:  08/31/15 125 lb 11.2 oz (57 kg)  08/24/15 124 lb 11.2 oz (56.6 kg)  08/17/15 127 lb 6.4 oz (57.8 kg)

## 2015-09-01 ENCOUNTER — Ambulatory Visit
Admission: RE | Admit: 2015-09-01 | Discharge: 2015-09-01 | Disposition: A | Discharge status: Home / Self Care | Payer: Medicare Other | Source: Ambulatory Visit | Attending: Radiation Oncology | Admitting: Radiation Oncology

## 2015-09-01 DIAGNOSIS — C50311 Malignant neoplasm of lower-inner quadrant of right female breast: Secondary | ICD-10-CM | POA: Diagnosis not present

## 2015-09-02 ENCOUNTER — Ambulatory Visit
Admission: RE | Admit: 2015-09-02 | Discharge: 2015-09-02 | Disposition: A | Discharge status: Home / Self Care | Payer: Medicare Other | Source: Ambulatory Visit | Attending: Radiation Oncology | Admitting: Radiation Oncology

## 2015-09-02 DIAGNOSIS — C50311 Malignant neoplasm of lower-inner quadrant of right female breast: Secondary | ICD-10-CM | POA: Diagnosis not present

## 2015-09-03 ENCOUNTER — Ambulatory Visit
Admission: RE | Admit: 2015-09-03 | Discharge: 2015-09-03 | Disposition: A | Discharge status: Home / Self Care | Payer: Medicare Other | Source: Ambulatory Visit | Attending: Radiation Oncology | Admitting: Radiation Oncology

## 2015-09-03 ENCOUNTER — Other Ambulatory Visit (HOSPITAL_COMMUNITY): Payer: Self-pay | Admitting: *Deleted

## 2015-09-03 DIAGNOSIS — C50311 Malignant neoplasm of lower-inner quadrant of right female breast: Secondary | ICD-10-CM | POA: Diagnosis not present

## 2015-09-04 ENCOUNTER — Ambulatory Visit (HOSPITAL_BASED_OUTPATIENT_CLINIC_OR_DEPARTMENT_OTHER)
Admission: RE | Admit: 2015-09-04 | Discharge: 2015-09-04 | Disposition: A | Discharge status: Home / Self Care | Payer: Medicare Other | Source: Ambulatory Visit | Attending: Internal Medicine | Admitting: Internal Medicine

## 2015-09-04 ENCOUNTER — Ambulatory Visit
Admission: RE | Admit: 2015-09-04 | Discharge: 2015-09-04 | Disposition: A | Discharge status: Home / Self Care | Payer: Medicare Other | Source: Ambulatory Visit | Attending: Radiation Oncology | Admitting: Radiation Oncology

## 2015-09-04 ENCOUNTER — Ambulatory Visit (HOSPITAL_COMMUNITY)
Admission: RE | Admit: 2015-09-04 | Discharge: 2015-09-04 | Disposition: A | Discharge status: Home / Self Care | Payer: Medicare Other | Source: Ambulatory Visit | Attending: Internal Medicine | Admitting: Internal Medicine

## 2015-09-04 VITALS — BP 122/70 | HR 68 | Wt 124.5 lb

## 2015-09-04 DIAGNOSIS — Z803 Family history of malignant neoplasm of breast: Secondary | ICD-10-CM | POA: Diagnosis not present

## 2015-09-04 DIAGNOSIS — Z800 Family history of malignant neoplasm of digestive organs: Secondary | ICD-10-CM | POA: Insufficient documentation

## 2015-09-04 DIAGNOSIS — M810 Age-related osteoporosis without current pathological fracture: Secondary | ICD-10-CM | POA: Insufficient documentation

## 2015-09-04 DIAGNOSIS — E785 Hyperlipidemia, unspecified: Secondary | ICD-10-CM | POA: Insufficient documentation

## 2015-09-04 DIAGNOSIS — Z8262 Family history of osteoporosis: Secondary | ICD-10-CM | POA: Diagnosis not present

## 2015-09-04 DIAGNOSIS — Z7983 Long term (current) use of bisphosphonates: Secondary | ICD-10-CM | POA: Insufficient documentation

## 2015-09-04 DIAGNOSIS — Z8249 Family history of ischemic heart disease and other diseases of the circulatory system: Secondary | ICD-10-CM | POA: Diagnosis not present

## 2015-09-04 DIAGNOSIS — Z808 Family history of malignant neoplasm of other organs or systems: Secondary | ICD-10-CM | POA: Insufficient documentation

## 2015-09-04 DIAGNOSIS — Z807 Family history of other malignant neoplasms of lymphoid, hematopoietic and related tissues: Secondary | ICD-10-CM | POA: Insufficient documentation

## 2015-09-04 DIAGNOSIS — C50311 Malignant neoplasm of lower-inner quadrant of right female breast: Secondary | ICD-10-CM

## 2015-09-04 DIAGNOSIS — Z79899 Other long term (current) drug therapy: Secondary | ICD-10-CM | POA: Diagnosis not present

## 2015-09-04 DIAGNOSIS — I10 Essential (primary) hypertension: Secondary | ICD-10-CM | POA: Insufficient documentation

## 2015-09-04 LAB — ECHOCARDIOGRAM COMPLETE
CHL CUP RV SYS PRESS: 16 mmHg
E decel time: 211 msec
EERAT: 9.03
FS: 19 % — AB (ref 28–44)
IVS/LV PW RATIO, ED: 0.78
LA ID, A-P, ES: 27 mm
LA diam end sys: 27 mm
LA vol A4C: 35.6 ml
LADIAMINDEX: 1.59 cm/m2
LAVOL: 44 mL
LAVOLIN: 25.9 mL/m2
LDCA: 2.54 cm2
LV E/e' medial: 9.03
LV E/e'average: 9.03
LV PW d: 9.26 mm — AB (ref 0.6–1.1)
LV TDI E'LATERAL: 8.7
LV TDI E'MEDIAL: 6.42
LV e' LATERAL: 8.7 cm/s
LVOTD: 18 mm
MV Dec: 211
MV pk A vel: 66.5 m/s
MVPG: 2 mmHg
MVPKEVEL: 78.6 m/s
RV LATERAL S' VELOCITY: 10.3 cm/s
Reg peak vel: 183 cm/s
TAPSE: 22.8 mm
TRMAXVEL: 183 cm/s

## 2015-09-04 NOTE — Progress Notes (Signed)
  Echocardiogram 2D Echocardiogram has been performed.  Shawna [name] 09/04/2015, 9:57 AM

## 2015-09-04 NOTE — Patient Instructions (Signed)
We will contact you in 3 months to schedule your next appointment and echocardiogram  

## 2015-09-04 NOTE — Progress Notes (Signed)
Patient ID: Shawna [name], female   DOB: 1940-10-20, 75 y.o.   MRN: [id number]    Advanced Heart Failure CardioOncology Consult Note   Referring Physician: Dr Lindi Adie Oncologist: Dr. Lindi Adie Primary Cardiologist: New  HPI:  Shawna [name] is a 75 y.o. female with right breast cancer as below. Referred by Dr. Lindi Adie for enrollment into cardio-oncology clinic  Breast Cancer Profile:  Right lumpectomy 02/03/2015: Invasive ductal carcinoma grade 2, 2.1 cm, with associated DCIS, margins are negative, 0/1 lymph node, ER 0%, PR 0%, HER-2 positive ratio 2.31, T2 N0 stage II a. (Right breast biopsy 3:30 position 12/09/2014: Invasive ductal carcinoma grade 3, ER 0%, PR 0%, HER-2 negative ratio 1.5, Ki-67 90%, 1 cm irregular mass T1C N0 stage IA clinical stage) Genetic counseling revealed NBN mutation Treatment plan includes TCH x 6 cycles followed by herceptin maintenance for 1 year.   She presents today for routine follow up. Completes 6 cycles TCH. Now she continue on herceptin and she is completing radiation.  Denies SOB/PND/Orthopnea. Says she is a little tired from radiation.      Echo 01/30/15 65-70%, LS' 11.5, GLS -21.4% Echo 04/30/15 60-65%, LS' 11.4, GLS -20.3%    Past Medical History:  Diagnosis Date  . Breast cancer ([place]) 11/2014  . Family history of breast cancer   . Family history of colon cancer   . Hyperlipidemia   . Hypertension   . Kidney stone on left side   . Monoallelic mutation of NBN gene   . Osteoporosis   . Skin cancer of nose     Current Outpatient Prescriptions  Medication Sig Dispense Refill  . ALPRAZolam (XANAX) 0.25 MG tablet Take 0.25-0.5 mg by mouth 2 (two) times daily as needed for anxiety.    . Calcium Carbonate-Vitamin D (CALCIUM 600 + D PO) Take 1 tablet by mouth daily.     . CRESTOR 20 MG tablet Take 1 tablet by mouth daily.  0  . gabapentin (NEURONTIN) 300 MG capsule Take 1 capsule (300 mg total) by mouth at bedtime. 30 capsule 6  .  hydrochlorothiazide (MICROZIDE) 12.5 MG capsule Take 12.5 mg by mouth every other day.     . lidocaine-prilocaine (EMLA) cream Apply to affected area once 30 g 3  . telmisartan (MICARDIS) 80 MG tablet Take 1 tablet (80 mg total) by mouth daily.    . Vitamin D, Ergocalciferol, (DRISDOL) 50000 units CAPS capsule Take 50,000 Units by mouth 2 (two) times a week. Monday and Thursday    . zoledronic acid (RECLAST) 5 MG/100ML SOLN Inject 5 mg into the vein once. annually      No current facility-administered medications for this encounter.     No Known Allergies    Social History   Social History  . Marital status: Widowed    Spouse name: N/A  . Number of children: 2  . Years of education: N/A   Occupational History  . Not on file.   Social History Main Topics  . Smoking status: Never Smoker  . Smokeless tobacco: Never Used  . Alcohol use 4.2 oz/week    7 Glasses of wine per week     Comment: 6-7 glasses of wine a week  . Drug use: No  . Sexual activity: No   Other Topics Concern  . Not on file   Social History Narrative  . No narrative on file      Family History  Problem Relation Age of Onset  . Colon cancer Sister 83  .  Breast cancer Mother 30  . Hypertension Mother   . Osteoporosis Mother   . Heart disease Father     CABG  . Dementia Father   . Throat cancer Brother 20  . Breast cancer Sister 69  . Lymphoma Sister 47  . Melanoma Sister   . Skin cancer Brother   . Skin cancer Daughter     Vitals:   09/04/15 0951  BP: 122/70  Pulse: 68  SpO2: 100%  Weight: 124 lb 8 oz (56.5 kg)    PHYSICAL EXAM: General:  Well appearing. No respiratory difficulty HEENT: normal Neck: supple. no JVD. Carotids 2+ bilat; no bruits. No lymphadenopathy or thyromegaly appreciated. Cor: PMI nondisplaced. Regular rate & rhythm. No rubs, gallops or murmurs. Lungs: clear Abdomen: soft, nontender, nondistended. No hepatosplenomegaly. No bruits or masses. Good bowel  sounds. Extremities: no cyanosis, clubbing, rash, edema Neuro: alert & oriented x 3, cranial nerves grossly intact. moves all 4 extremities w/o difficulty. Affect pleasant.  ASSESSMENT & PLAN:   1. Right Breast Cancer - HER 2 -neu +      -Treatment plan includes TCH x 6 cycles followed by herceptin maintenance for 1 year. (now s/p 3/6 cycles TCH)      - Dr Haroldine Laws discussed and reveiwed ECHO. ECHO parameters stable.  Continue Herceptin.   Follow up in 3 months with an ECHO.  Amy Clegg NP-C   09/04/2015 10:24 AM   Patient seen and examined with Darrick Grinder, NP. We discussed all aspects of the encounter. I agree with the assessment and plan as stated above.   I reviewed echos personally. EF and Doppler parameters stable. No HF on exam. Continue Herceptin. Repeat echo in 3 months.   Gurnoor Sloop,MD 10:57 PM

## 2015-09-07 ENCOUNTER — Ambulatory Visit
Admission: RE | Admit: 2015-09-07 | Discharge: 2015-09-07 | Disposition: A | Discharge status: Home / Self Care | Payer: Medicare Other | Source: Ambulatory Visit | Attending: Radiation Oncology | Admitting: Radiation Oncology

## 2015-09-07 ENCOUNTER — Encounter: Payer: Self-pay | Admitting: Radiation Oncology

## 2015-09-07 VITALS — BP 108/68 | HR 72 | Temp 98.9°F | Resp 12 | Wt 127.3 lb

## 2015-09-07 DIAGNOSIS — C50311 Malignant neoplasm of lower-inner quadrant of right female breast: Secondary | ICD-10-CM | POA: Diagnosis not present

## 2015-09-07 NOTE — Progress Notes (Signed)
PAIN: She is currently in no pain.  SKIN: Pt right breast- positive for erythema.  Pt denies edema.  Pt continues to apply Radiaplex as directed. BP 108/68   Pulse 72   Temp 98.9 F (37.2 C) (Oral)   Resp 12   Wt 127 lb 4.8 oz (57.7 kg)   LMP 01/17/1982 (Approximate)   SpO2 100%   BMI 18.80 kg/m  Wt Readings from Last 3 Encounters:  09/07/15 127 lb 4.8 oz (57.7 kg)  09/04/15 124 lb 8 oz (56.5 kg)  08/31/15 125 lb 11.2 oz (57 kg)

## 2015-09-07 NOTE — Progress Notes (Signed)
   Weekly Management Note:  outpatient    ICD-9-CM ICD-10-CM   1. 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  CBCT/MVCT images/Port film x-rays were reviewed.  The chart was checked.  She denies pain. She does report mild fatigue over the weekend. doing well. No new concerns.  Physical Findings:  weight is 127 lb 4.8 oz (57.7 kg). Her oral temperature is 98.9 F (37.2 C). Her blood pressure is 108/68 and her pulse is 72. Her respiration is 12 and oxygen saturation is 100%.   Wt Readings from Last 3 Encounters:  09/07/15 127 lb 4.8 oz (57.7 kg)  09/04/15 124 lb 8 oz (56.5 kg)  08/31/15 125 lb 11.2 oz (57 kg)   Mild dermatitis over right breast in UIQ, remainder of right breast is erythematous.  Impression:  The patient is tolerating radiotherapy.  Plan:  Continue radiotherapy as planned. Patient instructed to apply Radiaplex in treatment fields.Hydrocortisone 1% cream prn itching.   ________________________________   Eppie Gibson, M.D.

## 2015-09-08 ENCOUNTER — Ambulatory Visit
Admission: RE | Admit: 2015-09-08 | Discharge: 2015-09-08 | Disposition: A | Discharge status: Home / Self Care | Payer: Medicare Other | Source: Ambulatory Visit | Attending: Radiation Oncology | Admitting: Radiation Oncology

## 2015-09-08 DIAGNOSIS — C50311 Malignant neoplasm of lower-inner quadrant of right female breast: Secondary | ICD-10-CM | POA: Diagnosis not present

## 2015-09-09 ENCOUNTER — Ambulatory Visit
Admission: RE | Admit: 2015-09-09 | Discharge: 2015-09-09 | Disposition: A | Discharge status: Home / Self Care | Payer: Medicare Other | Source: Ambulatory Visit | Attending: Radiation Oncology | Admitting: Radiation Oncology

## 2015-09-09 DIAGNOSIS — C50311 Malignant neoplasm of lower-inner quadrant of right female breast: Secondary | ICD-10-CM | POA: Diagnosis not present

## 2015-09-10 ENCOUNTER — Ambulatory Visit
Admission: RE | Admit: 2015-09-10 | Discharge: 2015-09-10 | Disposition: A | Discharge status: Home / Self Care | Payer: Medicare Other | Source: Ambulatory Visit | Attending: Radiation Oncology | Admitting: Radiation Oncology

## 2015-09-10 DIAGNOSIS — C50311 Malignant neoplasm of lower-inner quadrant of right female breast: Secondary | ICD-10-CM | POA: Diagnosis not present

## 2015-09-11 ENCOUNTER — Ambulatory Visit
Admission: RE | Admit: 2015-09-11 | Discharge: 2015-09-11 | Disposition: A | Discharge status: Home / Self Care | Payer: Medicare Other | Source: Ambulatory Visit | Attending: Radiation Oncology | Admitting: Radiation Oncology

## 2015-09-11 DIAGNOSIS — C50311 Malignant neoplasm of lower-inner quadrant of right female breast: Secondary | ICD-10-CM | POA: Diagnosis not present

## 2015-09-14 ENCOUNTER — Encounter: Payer: Self-pay | Admitting: Radiation Oncology

## 2015-09-14 ENCOUNTER — Ambulatory Visit
Admission: RE | Admit: 2015-09-14 | Discharge: 2015-09-14 | Disposition: A | Discharge status: Home / Self Care | Payer: Medicare Other | Source: Ambulatory Visit | Attending: Radiation Oncology | Admitting: Radiation Oncology

## 2015-09-14 ENCOUNTER — Ambulatory Visit: Payer: Medicare Other | Admitting: Radiation Oncology

## 2015-09-14 VITALS — BP 120/76 | HR 62 | Temp 97.7°F | Ht 69.0 in | Wt 126.7 lb

## 2015-09-14 DIAGNOSIS — C50311 Malignant neoplasm of lower-inner quadrant of right female breast: Secondary | ICD-10-CM

## 2015-09-14 MED ORDER — RADIAPLEXRX EX GEL
Freq: Once | CUTANEOUS | Status: AC
  Administered 2015-09-14: 10:00:00 via TOPICAL

## 2015-09-14 NOTE — Addendum Note (Signed)
Encounter addended by: Ernst Spell, RN on: 09/14/2015  9:56 AM<BR>    Actions taken: Order Entry activity accessed, Diagnosis association updated

## 2015-09-14 NOTE — Progress Notes (Signed)
Shawna [name] presents for her 24th fraction of radiation to her Right Breast. She denies pain. She does admit to slight fatigue. She has redness to her Right Breast, with a erythema to her Right Axilla and upper outer quadrant of her Right Breast. She has questions regarding care after her treatment and how to know if her treatment has been successful. She continues taking Herceptin every 3 weeks.  BP 120/76   Pulse 62   Temp 97.7 F (36.5 C)   Ht 5\' 9"  (1.753 m)   Wt 126 lb 11.2 oz (57.5 kg)   LMP 01/17/1982 (Approximate)   BMI 18.71 kg/m

## 2015-09-14 NOTE — Addendum Note (Signed)
Encounter addended by: Ernst Spell, RN on: 09/14/2015 10:12 AM<BR>    Actions taken: [facility] administration accepted

## 2015-09-14 NOTE — Progress Notes (Signed)
   Weekly Management Note:  outpatient    ICD-9-CM ICD-10-CM   1. 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  CBCT/MVCT images/Port film x-rays were reviewed.  The chart was checked.  She denies itching. Skin is pinker. Fatigue noted.  Physical Findings:  height is 5\' 9"  (1.753 m) and weight is 126 lb 11.2 oz (57.5 kg). Her temperature is 97.7 F (36.5 C). Her blood pressure is 120/76 and her pulse is 62.   Wt Readings from Last 3 Encounters:  09/14/15 126 lb 11.2 oz (57.5 kg)  09/07/15 127 lb 4.8 oz (57.7 kg)  09/04/15 124 lb 8 oz (56.5 kg)   Mild dermatitis over right breast in UIQ, remainder of right breast is brightly erythematous.  Impression:  The patient is tolerating radiotherapy.  Plan:  Continue radiotherapy as planned. Patient instructed to apply Radiaplex in treatment fields.Hydrocortisone 1% cream prn itching. She will continue Herceptin.  We discussed future followup plans including physical exams and yearly mammograms to rule out recurrences.  ________________________________   Eppie Gibson, M.D.

## 2015-09-14 NOTE — Progress Notes (Signed)
Electron Holiday representative Note  Diagnosis: Breast Cancer Breast cancer of lower-inner quadrant of right female breast ([place]) 174.3 C50.311     The patient's CT images from her initial simulation were reviewed to plan her boost treatment to her right breast  lumpectomy cavity.  Measurements were made regarding the size and depth of the surgical bed. The boost to the lumpectomy cavity will be delivered with 9 MeV electrons; 10 Gy in 5 fractions has been prescribed to the 100% isodose line.   An electron Best boy was reviewed and approved.  A custom electron cut-out will be used for her boost field.    -----------------------------------  Eppie Gibson, MD

## 2015-09-15 ENCOUNTER — Ambulatory Visit
Admission: RE | Admit: 2015-09-15 | Discharge: 2015-09-15 | Disposition: A | Discharge status: Home / Self Care | Payer: Medicare Other | Source: Ambulatory Visit | Attending: Radiation Oncology | Admitting: Radiation Oncology

## 2015-09-15 ENCOUNTER — Ambulatory Visit: Payer: Medicare Other | Admitting: Radiation Oncology

## 2015-09-15 DIAGNOSIS — C50311 Malignant neoplasm of lower-inner quadrant of right female breast: Secondary | ICD-10-CM | POA: Diagnosis not present

## 2015-09-16 ENCOUNTER — Telehealth: Payer: Self-pay

## 2015-09-16 ENCOUNTER — Ambulatory Visit
Admission: RE | Admit: 2015-09-16 | Discharge: 2015-09-16 | Disposition: A | Discharge status: Home / Self Care | Payer: Medicare Other | Source: Ambulatory Visit | Attending: Radiation Oncology | Admitting: Radiation Oncology

## 2015-09-16 DIAGNOSIS — C50311 Malignant neoplasm of lower-inner quadrant of right female breast: Secondary | ICD-10-CM | POA: Diagnosis not present

## 2015-09-16 NOTE — Telephone Encounter (Signed)
I called Ms. Viglione to inform her that she had an exposure to the Flu virus during Radiation Therapy. I offered to call in a prescription for Tamiflu as a preventative, and she agreed. A prescription for Tamiflu was called in to the [facility] on [address] per her request.

## 2015-09-17 ENCOUNTER — Ambulatory Visit
Admission: RE | Admit: 2015-09-17 | Discharge: 2015-09-17 | Disposition: A | Discharge status: Home / Self Care | Payer: Medicare Other | Source: Ambulatory Visit | Attending: Radiation Oncology | Admitting: Radiation Oncology

## 2015-09-17 DIAGNOSIS — C50311 Malignant neoplasm of lower-inner quadrant of right female breast: Secondary | ICD-10-CM | POA: Diagnosis not present

## 2015-09-18 ENCOUNTER — Ambulatory Visit
Admission: RE | Admit: 2015-09-18 | Discharge: 2015-09-18 | Disposition: A | Discharge status: Home / Self Care | Payer: Medicare Other | Source: Ambulatory Visit | Attending: Radiation Oncology | Admitting: Radiation Oncology

## 2015-09-18 DIAGNOSIS — C50311 Malignant neoplasm of lower-inner quadrant of right female breast: Secondary | ICD-10-CM | POA: Diagnosis not present

## 2015-09-22 ENCOUNTER — Ambulatory Visit (HOSPITAL_BASED_OUTPATIENT_CLINIC_OR_DEPARTMENT_OTHER): Payer: Medicare Other | Admitting: Hematology and Oncology

## 2015-09-22 ENCOUNTER — Ambulatory Visit (HOSPITAL_BASED_OUTPATIENT_CLINIC_OR_DEPARTMENT_OTHER): Payer: Medicare Other

## 2015-09-22 ENCOUNTER — Other Ambulatory Visit (HOSPITAL_BASED_OUTPATIENT_CLINIC_OR_DEPARTMENT_OTHER): Payer: Medicare Other

## 2015-09-22 ENCOUNTER — Encounter: Payer: Self-pay | Admitting: *Deleted

## 2015-09-22 ENCOUNTER — Encounter: Payer: Self-pay | Admitting: Hematology and Oncology

## 2015-09-22 ENCOUNTER — Ambulatory Visit: Payer: Medicare Other

## 2015-09-22 ENCOUNTER — Telehealth: Payer: Self-pay | Admitting: Hematology and Oncology

## 2015-09-22 DIAGNOSIS — Z171 Estrogen receptor negative status [ER-]: Secondary | ICD-10-CM

## 2015-09-22 DIAGNOSIS — C50311 Malignant neoplasm of lower-inner quadrant of right female breast: Secondary | ICD-10-CM | POA: Diagnosis not present

## 2015-09-22 DIAGNOSIS — Z5112 Encounter for antineoplastic immunotherapy: Secondary | ICD-10-CM | POA: Diagnosis not present

## 2015-09-22 DIAGNOSIS — R2240 Localized swelling, mass and lump, unspecified lower limb: Secondary | ICD-10-CM

## 2015-09-22 DIAGNOSIS — G629 Polyneuropathy, unspecified: Secondary | ICD-10-CM | POA: Diagnosis not present

## 2015-09-22 DIAGNOSIS — D6481 Anemia due to antineoplastic chemotherapy: Secondary | ICD-10-CM

## 2015-09-22 LAB — COMPREHENSIVE METABOLIC PANEL
ALK PHOS: 183 U/L — AB (ref 40–150)
ALT: 46 U/L (ref 0–55)
AST: 38 U/L — AB (ref 5–34)
Albumin: 3.4 g/dL — ABNORMAL LOW (ref 3.5–5.0)
Anion Gap: 10 mEq/L (ref 3–11)
BUN: 21.9 mg/dL (ref 7.0–26.0)
CALCIUM: 9.4 mg/dL (ref 8.4–10.4)
CO2: 24 mEq/L (ref 22–29)
CREATININE: 0.9 mg/dL (ref 0.6–1.1)
Chloride: 108 mEq/L (ref 98–109)
EGFR: 66 mL/min/{1.73_m2} — ABNORMAL LOW (ref >90)
GLUCOSE: 78 mg/dL (ref 70–140)
POTASSIUM: 3.7 meq/L (ref 3.5–5.1)
Sodium: 142 mEq/L (ref 136–145)
Total Bilirubin: 0.7 mg/dL (ref 0.20–1.20)
Total Protein: 6.4 g/dL (ref 6.4–8.3)

## 2015-09-22 LAB — CBC WITH DIFFERENTIAL/PLATELET
BASO%: 1.3 % (ref 0.0–2.0)
BASOS ABS: 0 10*3/uL (ref 0.0–0.1)
EOS%: 4.2 % (ref 0.0–7.0)
Eosinophils Absolute: 0.1 10*3/uL (ref 0.0–0.5)
HCT: 33.4 % — ABNORMAL LOW (ref 34.8–46.6)
HGB: 11.4 g/dL — ABNORMAL LOW (ref 11.6–15.9)
LYMPH%: 10.1 % — AB (ref 14.0–49.7)
MCH: 34.9 pg — AB (ref 25.1–34.0)
MCHC: 34 g/dL (ref 31.5–36.0)
MCV: 102.6 fL — ABNORMAL HIGH (ref 79.5–101.0)
MONO#: 0.2 10*3/uL (ref 0.1–0.9)
MONO%: 7.1 % (ref 0.0–14.0)
NEUT#: 2.6 10*3/uL (ref 1.5–6.5)
NEUT%: 77.3 % — AB (ref 38.4–76.8)
PLATELETS: 153 10*3/uL (ref 145–400)
RBC: 3.26 10*6/uL — AB (ref 3.70–5.45)
RDW: 12.3 % (ref 11.2–14.5)
WBC: 3.3 10*3/uL — ABNORMAL LOW (ref 3.9–10.3)
lymph#: 0.3 10*3/uL — ABNORMAL LOW (ref 0.9–3.3)

## 2015-09-22 MED ORDER — SODIUM CHLORIDE 0.9 % IV SOLN
Freq: Once | INTRAVENOUS | Status: AC
  Administered 2015-09-22: 10:00:00 via INTRAVENOUS

## 2015-09-22 MED ORDER — DIPHENHYDRAMINE HCL 25 MG PO CAPS
ORAL_CAPSULE | ORAL | Status: AC
  Filled 2015-09-22: qty 2

## 2015-09-22 MED ORDER — TRASTUZUMAB CHEMO 150 MG IV SOLR
6.0000 mg/kg | Freq: Once | INTRAVENOUS | Status: AC
  Administered 2015-09-22: 378 mg via INTRAVENOUS
  Filled 2015-09-22: qty 18

## 2015-09-22 MED ORDER — ACETAMINOPHEN 325 MG PO TABS
650.0000 mg | ORAL_TABLET | Freq: Once | ORAL | Status: AC
  Administered 2015-09-22: 650 mg via ORAL

## 2015-09-22 MED ORDER — SODIUM CHLORIDE 0.9% FLUSH
10.0000 mL | INTRAVENOUS | Status: DC | PRN
  Administered 2015-09-22: 10 mL
  Filled 2015-09-22: qty 10

## 2015-09-22 MED ORDER — ACETAMINOPHEN 325 MG PO TABS
ORAL_TABLET | ORAL | Status: AC
  Filled 2015-09-22: qty 2

## 2015-09-22 MED ORDER — HEPARIN SOD (PORK) LOCK FLUSH 100 UNIT/ML IV SOLN
500.0000 [IU] | Freq: Once | INTRAVENOUS | Status: AC | PRN
  Administered 2015-09-22: 500 [IU]
  Filled 2015-09-22: qty 5

## 2015-09-22 MED ORDER — DIPHENHYDRAMINE HCL 25 MG PO CAPS
50.0000 mg | ORAL_CAPSULE | Freq: Once | ORAL | Status: AC
  Administered 2015-09-22: 50 mg via ORAL

## 2015-09-22 NOTE — Assessment & Plan Note (Signed)
Right lumpectomy 02/03/2015: Invasive ductal carcinoma grade 2, 2.1 cm, with associated DCIS, margins are negative, 0/1 lymph node, ER 0%, PR 0%, HER-2 positiveratio 2.31, T2 N0 stage II a. 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 Adjuvant chemotherapy with TCH 6 cycles started 03/09/15 completed 06/22/15 now on Herceptin maintenance for 1 year  2. Followed by radiation therapy 08/11/2015 to 09/22/2015 -------------------------------------------------------------------------------------------------------------------------------------------------------- Current Treatment: Herceptin maintenance ECHO 4/17: EF 65-70% (patient will need new echo) Monitoring closely for toxicities I reviewed her blood work.  Leg swelling: I will get an ultrasound of the legs to evaluate and rule out DVT. Patient's primary care physician is helping her adjust her blood pressure medications and treat the leg swelling. 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 We will have to closely watch and monitor her neuropathy symptoms. Nail changes: Fever the nails are healing on the nails actually came off yesterday on her toes. These are related to prior chemotherapy.  RTC in 3 weeks for Herceptin maintenance in 6 weeks for follow-up

## 2015-09-22 NOTE — Progress Notes (Signed)
Patient Care Team: [name], MD as PCP - General (Internal Medicine) [name], MD as Consulting Physician (General Surgery) [name], MD as Consulting Physician (Hematology and Oncology)  DIAGNOSIS: No matching staging information was found for the patient.  SUMMARY OF ONCOLOGIC HISTORY:   Breast cancer of lower-inner quadrant of right female breast ([place])   12/02/2014 Mammogram    Right breast irregular mass 1 cm size with calcifications      12/09/2014 Initial Diagnosis    Right breast biopsy 3:30 position: Invasive ductal carcinoma grade 3, ER 0%, PR 0%, HER-2 negative ratio 1.5, Ki-67 90%, T1C N0 stage IA clinical stage      12/16/2014 Procedure    genetic testing showed NBN pathologic mutation      02/03/2015 Surgery    Right lumpectomy: Invasive ductal carcinoma grade 2, 2.1 cm, with associated DCIS, margins are negative, 0/1 lymph node, ER 0%, PR 0%, HER-2 positive ratio 2.31, T2 N0 stage II a      03/09/2015 - 06/22/2015 Chemotherapy    Adjuvant chemotherapy with [place] 6 followed by Herceptin maintenance for 1 year      08/11/2015 - 09/22/2015 Radiation Therapy    Adjuvant radiation therapy       CHIEF COMPLIANT: F/U on Herceptin  INTERVAL HISTORY: Shawna [name] is a 75 Yr old with prior Rt Breast cancer now on herceptin maintenance and is finishing XRT tomorrow. Continues to have neuropathy  REVIEW OF SYSTEMS:   Constitutional: Denies fevers, chills or abnormal weight loss Eyes: Denies blurriness of vision Ears, nose, mouth, throat, and face: Denies mucositis or sore throat Respiratory: Denies cough, dyspnea or wheezes Cardiovascular: Denies palpitation, chest discomfort Gastrointestinal:  Denies nausea, heartburn or change in bowel habits Skin: Denies abnormal skin rashes Lymphatics: Denies new lymphadenopathy or easy bruising Neurological:Bil peripheral neuropathy Behavioral/Psych: Mood is stable, no new changes  Extremities: No lower extremity  edema Breast:  denies any pain or lumps or nodules in either breasts All other systems were reviewed with the patient and are negative.  I have reviewed the past medical history, past surgical history, social history and family history with the patient and they are unchanged from previous note.  ALLERGIES:  has No Known Allergies.  MEDICATIONS:  Current Outpatient Prescriptions  Medication Sig Dispense Refill  . ALPRAZolam (XANAX) 0.25 MG tablet Take 0.25-0.5 mg by mouth 2 (two) times daily as needed for anxiety.    . Calcium Carbonate-Vitamin D (CALCIUM 600 + D PO) Take 1 tablet by mouth daily.     . CRESTOR 20 MG tablet Take 1 tablet by mouth daily.  0  . gabapentin (NEURONTIN) 300 MG capsule Take 1 capsule (300 mg total) by mouth at bedtime. 30 capsule 6  . hydrochlorothiazide (MICROZIDE) 12.5 MG capsule Take 12.5 mg by mouth every other day.     . lidocaine-prilocaine (EMLA) cream Apply to affected area once 30 g 3  . telmisartan (MICARDIS) 80 MG tablet Take 1 tablet (80 mg total) by mouth daily.    . Vitamin D, Ergocalciferol, (DRISDOL) 50000 units CAPS capsule Take 50,000 Units by mouth 2 (two) times a week. Monday and Thursday    . zoledronic acid (RECLAST) 5 MG/100ML SOLN Inject 5 mg into the vein once. annually      No current facility-administered medications for this visit.     PHYSICAL EXAMINATION: ECOG PERFORMANCE STATUS: 1 - Symptomatic but completely ambulatory  Vitals:   09/22/15 0921  BP: 124/81  Pulse: 71  Resp: 18  Temp: 97.9 F (36.6 C)   Filed Weights   09/22/15 0921  Weight: 127 lb 8 oz (57.8 kg)    GENERAL:alert, no distress and comfortable SKIN: skin color, texture, turgor are normal, no rashes or significant lesions EYES: normal, Conjunctiva are pink and non-injected, sclera clear OROPHARYNX:no exudate, no erythema and lips, buccal mucosa, and tongue normal  NECK: supple, thyroid normal size, non-tender, without nodularity LYMPH:  no palpable  lymphadenopathy in the cervical, axillary or inguinal LUNGS: clear to auscultation and percussion with normal breathing effort HEART: regular rate & rhythm and no murmurs and no lower extremity edema ABDOMEN:abdomen soft, non-tender and normal bowel sounds MUSCULOSKELETAL:no cyanosis of digits and no clubbing  NEURO: 2+ neuropathy EXTREMITIES: No lower extremity edema  LABORATORY DATA:  I have reviewed the data as listed   Chemistry      Component Value Date/Time   NA 142 09/22/2015 0902   K 3.7 09/22/2015 0902   CL 108 01/29/2015 1545   CO2 24 09/22/2015 0902   BUN 21.9 09/22/2015 0902   CREATININE 0.9 09/22/2015 0902      Component Value Date/Time   CALCIUM 9.4 09/22/2015 0902   ALKPHOS 183 (H) 09/22/2015 0902   AST 38 (H) 09/22/2015 0902   ALT 46 09/22/2015 0902   BILITOT 0.70 09/22/2015 0902       Lab Results  Component Value Date   WBC 3.3 (L) 09/22/2015   HGB 11.4 (L) 09/22/2015   HCT 33.4 (L) 09/22/2015   MCV 102.6 (H) 09/22/2015   PLT 153 09/22/2015   NEUTROABS 2.6 09/22/2015     ASSESSMENT & PLAN:  Breast cancer of lower-inner quadrant of right female breast ([place]) Right lumpectomy 02/03/2015: Invasive ductal carcinoma grade 2, 2.1 cm, with associated DCIS, margins are negative, 0/1 lymph node, ER 0%, PR 0%, HER-2 positiveratio 2.31, T2 N0 stage II a. 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 Adjuvant chemotherapy with TCH 6 cycles started 03/09/15 completed 06/22/15 now on Herceptin maintenance for 1 year  2. Followed by radiation therapy 08/11/2015 to 09/22/2015 -------------------------------------------------------------------------------------------------------------------------------------------------------- Current Treatment: Herceptin  maintenance ECHO 4/17: EF 65-70% (patient will need new echo) Monitoring closely for toxicities I reviewed her blood work.  Leg swelling: I will get an ultrasound of the legs to evaluate and rule out DVT. Patient's primary care physician is helping her adjust her blood pressure medications and treat the leg swelling. 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 We will have to closely watch and monitor her neuropathy symptoms. I do not believe that Herceptin is causing her neuropathy.   Nail changes: Fever the nails are healing on the nails actually came off yesterday on her toes. These are related to prior chemotherapy.  RTC in 3 weeks for Herceptin maintenance in 6 weeks for follow-up  No orders of the defined types were placed in this encounter.  The patient has a good understanding of the overall plan. she agrees with it. she will call with any problems that may develop before the next visit here.   Rulon Eisenmenger, MD 09/22/15

## 2015-09-22 NOTE — Patient Instructions (Signed)
[facility] Discharge Instructions for Patients Receiving Chemotherapy  Today you received the following chemotherapy agents Herceptin  To help prevent nausea and vomiting after your treatment, we encourage you to take your nausea medication    If you develop nausea and vomiting that is not controlled by your nausea medication, call the clinic.   BELOW ARE SYMPTOMS THAT SHOULD BE REPORTED IMMEDIATELY:  *FEVER GREATER THAN 100.5 F  *CHILLS WITH OR WITHOUT FEVER  NAUSEA AND VOMITING THAT IS NOT CONTROLLED WITH YOUR NAUSEA MEDICATION  *UNUSUAL SHORTNESS OF BREATH  *UNUSUAL BRUISING OR BLEEDING  TENDERNESS IN MOUTH AND THROAT WITH OR WITHOUT PRESENCE OF ULCERS  *URINARY PROBLEMS  *BOWEL PROBLEMS  UNUSUAL RASH Items with * indicate a potential emergency and should be followed up as soon as possible.  Feel free to call the clinic you have any questions or concerns. The clinic phone number is [phone].  Please show the CHEMO ALERT CARD at check-in to the Emergency Department and triage nurse.   

## 2015-09-22 NOTE — Telephone Encounter (Signed)
appt made and avs printed °

## 2015-09-23 ENCOUNTER — Ambulatory Visit: Payer: Medicare Other

## 2015-09-23 ENCOUNTER — Encounter: Payer: Self-pay | Admitting: Radiation Oncology

## 2015-09-23 ENCOUNTER — Ambulatory Visit
Admission: RE | Admit: 2015-09-23 | Discharge: 2015-09-23 | Disposition: A | Discharge status: Home / Self Care | Payer: Medicare Other | Source: Ambulatory Visit | Attending: Radiation Oncology | Admitting: Radiation Oncology

## 2015-09-23 VITALS — BP 123/81 | HR 75 | Temp 98.2°F | Ht 69.0 in | Wt 128.7 lb

## 2015-09-23 DIAGNOSIS — C50311 Malignant neoplasm of lower-inner quadrant of right female breast: Secondary | ICD-10-CM | POA: Diagnosis not present

## 2015-09-23 NOTE — Progress Notes (Signed)
Ms. Shawna [name] is here for her last fraction of radiation to her Right Breast. She denies pain. She reports some mild fatigue. Her skin is slightly hyperpigmented and red. She denies tenderness. She is using the Radiaplex cream twice daily and will continue for another 3 weeks. She will being using Vitamin E cream afterwards. She was given a follow up appointment. She will continue herceptin every 3 weeks until January 30th.   BP 123/81   Pulse 75   Temp 98.2 F (36.8 C)   Ht 5\' 9"  (1.753 m)   Wt 128 lb 11.2 oz (58.4 kg)   LMP 01/17/1982 (Approximate)   BMI 19.01 kg/m    Wt Readings from Last 3 Encounters:  09/23/15 128 lb 11.2 oz (58.4 kg)  09/22/15 127 lb 8 oz (57.8 kg)  09/14/15 126 lb 11.2 oz (57.5 kg)

## 2015-09-23 NOTE — Progress Notes (Signed)
   Weekly Management Note:  outpatient    ICD-9-CM ICD-10-CM   1. 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  CBCT/MVCT images/Port film x-rays were reviewed.  The chart was checked. Shawna [name] is here for her last fraction of radiation to her Right Breast. She denies pain. She reports mild fatigue. Her skin is slightly hyperpigmented and red. She denies tenderness. She is using Radiaplex cream twice daily and will continue for another 3 weeks. She will be using Vitamin E cream afterwards. She was given a follow up appointment. She will continue herceptin every 3 weeks until January 30th.  Physical Findings:  height is 5\' 9"  (1.753 m) and weight is 128 lb 11.2 oz (58.4 kg). Her temperature is 98.2 F (36.8 C). Her blood pressure is 123/81 and her pulse is 75.   Wt Readings from Last 3 Encounters:  09/23/15 128 lb 11.2 oz (58.4 kg)  09/22/15 127 lb 8 oz (57.8 kg)  09/14/15 126 lb 11.2 oz (57.5 kg)     Impression:  The patient has tolerated radiotherapy.  Plan:  Follow up in one month. She has received an appointment. Skin care was discussed.   ________________________________   Eppie Gibson, M.D.   This document serves as a record of services personally performed by Eppie Gibson, MD. It was created on her behalf by Bethann Humble, a trained medical scribe. The creation of this record is based on the scribe's personal observations and the provider's statements to them. This document has been checked and approved by the attending provider.

## 2015-09-30 NOTE — Progress Notes (Signed)
  Radiation Oncology         [phone] ________________________________  Name: Shawna [name] MRN: [id number]  Date: 09/23/2015  DOB: 02-18-1940  End of Treatment Note  DIAGNOSIS:     ICD-9-CM ICD-10-CM   1. Breast cancer of lower-inner quadrant of right female breast ([place]) 174.3 C50.311    Indication for treatment:  curative      Radiation treatment dates:   08/11/2015-09/23/2015  Site/dose:   1) Right Breast / 50 Gy in 25 fractions ; 2) Right Breast Boost / 10 Gy in 5 fractions  Beams/energy:   1) 3D / 10 and 6 X  ; 2) Electrons / 9 MeV  Narrative: The patient tolerated radiation treatment relatively well.      Plan: The patient has completed radiation treatment. The patient will return to radiation oncology clinic for routine followup in one month. I advised them to call or return sooner if they have any questions or concerns related to their recovery or treatment.  -----------------------------------  Eppie Gibson, MD

## 2015-10-12 ENCOUNTER — Other Ambulatory Visit: Payer: Medicare Other

## 2015-10-12 ENCOUNTER — Ambulatory Visit (HOSPITAL_BASED_OUTPATIENT_CLINIC_OR_DEPARTMENT_OTHER): Payer: Medicare Other

## 2015-10-12 ENCOUNTER — Ambulatory Visit: Payer: Medicare Other | Admitting: Hematology and Oncology

## 2015-10-12 VITALS — BP 133/79 | HR 65 | Temp 98.2°F | Resp 18

## 2015-10-12 DIAGNOSIS — C50311 Malignant neoplasm of lower-inner quadrant of right female breast: Secondary | ICD-10-CM

## 2015-10-12 DIAGNOSIS — Z5112 Encounter for antineoplastic immunotherapy: Secondary | ICD-10-CM

## 2015-10-12 MED ORDER — ACETAMINOPHEN 325 MG PO TABS
ORAL_TABLET | ORAL | Status: AC
  Filled 2015-10-12: qty 2

## 2015-10-12 MED ORDER — SODIUM CHLORIDE 0.9% FLUSH
10.0000 mL | INTRAVENOUS | Status: DC | PRN
Start: 2015-10-12 — End: 2015-10-12
  Administered 2015-10-12: 10 mL
  Filled 2015-10-12: qty 10

## 2015-10-12 MED ORDER — HEPARIN SOD (PORK) LOCK FLUSH 100 UNIT/ML IV SOLN
500.0000 [IU] | Freq: Once | INTRAVENOUS | Status: AC | PRN
  Administered 2015-10-12: 500 [IU]
  Filled 2015-10-12: qty 5

## 2015-10-12 MED ORDER — DIPHENHYDRAMINE HCL 25 MG PO CAPS
50.0000 mg | ORAL_CAPSULE | Freq: Once | ORAL | Status: AC
  Administered 2015-10-12: 50 mg via ORAL

## 2015-10-12 MED ORDER — SODIUM CHLORIDE 0.9 % IV SOLN
Freq: Once | INTRAVENOUS | Status: AC
  Administered 2015-10-12: 11:00:00 via INTRAVENOUS

## 2015-10-12 MED ORDER — TRASTUZUMAB CHEMO 150 MG IV SOLR
6.0000 mg/kg | Freq: Once | INTRAVENOUS | Status: AC
  Administered 2015-10-12: 378 mg via INTRAVENOUS
  Filled 2015-10-12: qty 18

## 2015-10-12 MED ORDER — ACETAMINOPHEN 325 MG PO TABS
650.0000 mg | ORAL_TABLET | Freq: Once | ORAL | Status: AC
  Administered 2015-10-12: 650 mg via ORAL

## 2015-10-12 MED ORDER — DIPHENHYDRAMINE HCL 25 MG PO CAPS
ORAL_CAPSULE | ORAL | Status: AC
  Filled 2015-10-12: qty 2

## 2015-10-12 NOTE — Patient Instructions (Signed)
[facility] Discharge Instructions for Patients Receiving Chemotherapy  Today you received the following chemotherapy agents Herceptin  To help prevent nausea and vomiting after your treatment, we encourage you to take your nausea medication    If you develop nausea and vomiting that is not controlled by your nausea medication, call the clinic.   BELOW ARE SYMPTOMS THAT SHOULD BE REPORTED IMMEDIATELY:  *FEVER GREATER THAN 100.5 F  *CHILLS WITH OR WITHOUT FEVER  NAUSEA AND VOMITING THAT IS NOT CONTROLLED WITH YOUR NAUSEA MEDICATION  *UNUSUAL SHORTNESS OF BREATH  *UNUSUAL BRUISING OR BLEEDING  TENDERNESS IN MOUTH AND THROAT WITH OR WITHOUT PRESENCE OF ULCERS  *URINARY PROBLEMS  *BOWEL PROBLEMS  UNUSUAL RASH Items with * indicate a potential emergency and should be followed up as soon as possible.  Feel free to call the clinic you have any questions or concerns. The clinic phone number is [phone].  Please show the CHEMO ALERT CARD at check-in to the Emergency Department and triage nurse.   

## 2015-10-27 DIAGNOSIS — I10 Essential (primary) hypertension: Secondary | ICD-10-CM | POA: Diagnosis not present

## 2015-10-27 DIAGNOSIS — N390 Urinary tract infection, site not specified: Secondary | ICD-10-CM | POA: Diagnosis not present

## 2015-10-27 DIAGNOSIS — E559 Vitamin D deficiency, unspecified: Secondary | ICD-10-CM | POA: Diagnosis not present

## 2015-10-27 DIAGNOSIS — E784 Other hyperlipidemia: Secondary | ICD-10-CM | POA: Diagnosis not present

## 2015-10-27 DIAGNOSIS — R8299 Other abnormal findings in urine: Secondary | ICD-10-CM | POA: Diagnosis not present

## 2015-10-28 ENCOUNTER — Encounter: Payer: Self-pay | Admitting: Radiation Oncology

## 2015-10-30 ENCOUNTER — Encounter: Payer: Self-pay | Admitting: Radiation Oncology

## 2015-10-30 ENCOUNTER — Ambulatory Visit
Admission: RE | Admit: 2015-10-30 | Discharge: 2015-10-30 | Disposition: A | Discharge status: Home / Self Care | Payer: Medicare Other | Source: Ambulatory Visit | Attending: Radiation Oncology | Admitting: Radiation Oncology

## 2015-10-30 DIAGNOSIS — C50311 Malignant neoplasm of lower-inner quadrant of right female breast: Secondary | ICD-10-CM | POA: Diagnosis not present

## 2015-10-30 HISTORY — DX: Personal history of irradiation: Z92.3

## 2015-10-30 NOTE — Progress Notes (Signed)
Shawna [name] is here for follow up of radiation completed 09/23/15 to her Right Breast. She denies pain. She does have some fatigue especially after her Heceptin infusion. She will receive this until the end of January.  She reports the skin to her Right Breast has healed. She has not been using cream, but will begin using Radiaplex again for skin protection and switch to a Vitamin E cream when completed.  BP 129/79   Pulse 78   Temp 98.2 F (36.8 C)   Ht 5\' 9"  (1.753 m)   Wt 129 lb (58.5 kg)   LMP 01/17/1982 (Approximate)   SpO2 100% Comment: room air  BMI 19.05 kg/m    Wt Readings from Last 3 Encounters:  10/30/15 129 lb (58.5 kg)  09/23/15 128 lb 11.2 oz (58.4 kg)  09/22/15 127 lb 8 oz (57.8 kg)

## 2015-10-30 NOTE — Progress Notes (Signed)
Radiation Oncology         [phone] ________________________________  Name: Shawna [name] MRN: [id number]  Date: 10/30/2015  DOB: 07/15/1940  Follow-Up Visit Note  Outpatient  CC: Precious Reel, MD  [name], MD  Diagnosis and Prior Radiotherapy:    ICD-9-CM ICD-10-CM   1. 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 She denies pain. She does have some fatigue especially after her Heceptin infusion. She will receive this until the end of January.  She reports the skin to her Right Breast has healed. She has not been using cream, but will  switch to a Vitamin E lotion  ALLERGIES:  has No Known Allergies.  Meds: Current Outpatient Prescriptions  Medication Sig Dispense Refill  . ALPRAZolam (XANAX) 0.25 MG tablet Take 0.25-0.5 mg by mouth 2 (two) times daily as needed for anxiety.    . Calcium Carbonate-Vitamin D (CALCIUM 600 + D PO) Take 1 tablet by mouth daily.     . CRESTOR 20 MG tablet Take 1 tablet by mouth daily.  0  . gabapentin (NEURONTIN) 300 MG capsule Take 1 capsule (300 mg total) by mouth at bedtime. 30 capsule 6  . hydrochlorothiazide (MICROZIDE) 12.5 MG capsule Take 12.5 mg by mouth every other day.     . lidocaine-prilocaine (EMLA) cream Apply to affected area once 30 g 3  . telmisartan (MICARDIS) 80 MG tablet Take 1 tablet (80 mg total) by mouth daily.    Marland Kitchen tolterodine (DETROL) 2 MG tablet Take 2 mg by mouth at bedtime.    . Vitamin D, Ergocalciferol, (DRISDOL) 50000 units CAPS capsule Take 50,000 Units by mouth 2 (two) times a week. Monday and Thursday    . zoledronic acid (RECLAST) 5 MG/100ML  SOLN Inject 5 mg into the vein once. annually      No current facility-administered medications for this encounter.     Physical Findings: The patient is in no acute distress. Patient is alert and oriented.  height is _0  (1.753 m) and weight is 129 lb (58.5 kg). Her temperature is 98.2 F (36.8 C). Her blood pressure is 129/79 and her pulse is 78. Her oxygen saturation is 100%. .    Breast: Faint hyperpigmentation over the right breast. The skin has healed well overall.  Lab Findings: Lab Results  Component Value Date   WBC 3.3 (L) 09/22/2015   HGB 11.4 (L) 09/22/2015   HCT 33.4 (L) 09/22/2015   MCV 102.6 (H) 09/22/2015   PLT 153 09/22/2015    Radiographic Findings: No results found.  Impression/Plan: Healing well from radiotherapy.  Apply vitamin E lotion to skin.  I encouraged her to continue with yearly mammography and followup with medical oncology. I will see her back on an as-needed basis. I have encouraged her to call if she has any issues or concerns in the future. I wished her the very best.  _____________________________________   Eppie Gibson, MD  This document serves as a record of services personally performed by Eppie Gibson, MD. It was created on her behalf by Darcus Austin, a trained medical scribe. The creation of this record is  based on the scribe's personal observations and the provider's statements to them. This document has been checked and approved by the attending provider.

## 2015-11-02 ENCOUNTER — Ambulatory Visit (HOSPITAL_BASED_OUTPATIENT_CLINIC_OR_DEPARTMENT_OTHER): Payer: Medicare Other

## 2015-11-02 ENCOUNTER — Encounter: Payer: Self-pay | Admitting: *Deleted

## 2015-11-02 ENCOUNTER — Ambulatory Visit (HOSPITAL_BASED_OUTPATIENT_CLINIC_OR_DEPARTMENT_OTHER): Payer: Medicare Other | Admitting: Hematology and Oncology

## 2015-11-02 ENCOUNTER — Other Ambulatory Visit (HOSPITAL_BASED_OUTPATIENT_CLINIC_OR_DEPARTMENT_OTHER): Payer: Medicare Other

## 2015-11-02 ENCOUNTER — Encounter: Payer: Self-pay | Admitting: Hematology and Oncology

## 2015-11-02 DIAGNOSIS — G629 Polyneuropathy, unspecified: Secondary | ICD-10-CM

## 2015-11-02 DIAGNOSIS — Z5112 Encounter for antineoplastic immunotherapy: Secondary | ICD-10-CM

## 2015-11-02 DIAGNOSIS — Z171 Estrogen receptor negative status [ER-]: Secondary | ICD-10-CM | POA: Diagnosis not present

## 2015-11-02 DIAGNOSIS — C50311 Malignant neoplasm of lower-inner quadrant of right female breast: Secondary | ICD-10-CM | POA: Diagnosis not present

## 2015-11-02 DIAGNOSIS — D6481 Anemia due to antineoplastic chemotherapy: Secondary | ICD-10-CM | POA: Diagnosis not present

## 2015-11-02 LAB — CBC WITH DIFFERENTIAL/PLATELET
BASO%: 1.3 % (ref 0.0–2.0)
Basophils Absolute: 0 10*3/uL (ref 0.0–0.1)
EOS ABS: 0.2 10*3/uL (ref 0.0–0.5)
EOS%: 4.7 % (ref 0.0–7.0)
HCT: 34.2 % — ABNORMAL LOW (ref 34.8–46.6)
HGB: 11.6 g/dL (ref 11.6–15.9)
LYMPH%: 12.5 % — AB (ref 14.0–49.7)
MCH: 33.7 pg (ref 25.1–34.0)
MCHC: 33.9 g/dL (ref 31.5–36.0)
MCV: 99.4 fL (ref 79.5–101.0)
MONO#: 0.3 10*3/uL (ref 0.1–0.9)
MONO%: 8.5 % (ref 0.0–14.0)
NEUT%: 73 % (ref 38.4–76.8)
NEUTROS ABS: 2.3 10*3/uL (ref 1.5–6.5)
Platelets: 157 10*3/uL (ref 145–400)
RBC: 3.44 10*6/uL — AB (ref 3.70–5.45)
RDW: 13.9 % (ref 11.2–14.5)
WBC: 3.2 10*3/uL — AB (ref 3.9–10.3)
lymph#: 0.4 10*3/uL — ABNORMAL LOW (ref 0.9–3.3)

## 2015-11-02 LAB — COMPREHENSIVE METABOLIC PANEL
ALT: 33 U/L (ref 0–55)
AST: 30 U/L (ref 5–34)
Albumin: 3.3 g/dL — ABNORMAL LOW (ref 3.5–5.0)
Alkaline Phosphatase: 147 U/L (ref 40–150)
Anion Gap: 9 mEq/L (ref 3–11)
BUN: 20.5 mg/dL (ref 7.0–26.0)
CO2: 25 meq/L (ref 22–29)
Calcium: 9.5 mg/dL (ref 8.4–10.4)
Chloride: 108 mEq/L (ref 98–109)
Creatinine: 0.9 mg/dL (ref 0.6–1.1)
EGFR: 62 mL/min/{1.73_m2} — AB (ref >90)
GLUCOSE: 69 mg/dL — AB (ref 70–140)
POTASSIUM: 3.9 meq/L (ref 3.5–5.1)
SODIUM: 142 meq/L (ref 136–145)
Total Bilirubin: 0.53 mg/dL (ref 0.20–1.20)
Total Protein: 6.4 g/dL (ref 6.4–8.3)

## 2015-11-02 MED ORDER — SODIUM CHLORIDE 0.9% FLUSH
10.0000 mL | INTRAVENOUS | Status: DC | PRN
  Administered 2015-11-02: 10 mL
  Filled 2015-11-02: qty 10

## 2015-11-02 MED ORDER — ACETAMINOPHEN 325 MG PO TABS
325.0000 mg | ORAL_TABLET | Freq: Once | ORAL | Status: AC
  Administered 2015-11-02: 325 mg via ORAL

## 2015-11-02 MED ORDER — HEPARIN SOD (PORK) LOCK FLUSH 100 UNIT/ML IV SOLN
500.0000 [IU] | Freq: Once | INTRAVENOUS | Status: AC | PRN
Start: 2015-11-02 — End: 2015-11-02
  Administered 2015-11-02: 500 [IU]
  Filled 2015-11-02: qty 5

## 2015-11-02 MED ORDER — SODIUM CHLORIDE 0.9 % IV SOLN
6.0000 mg/kg | Freq: Once | INTRAVENOUS | Status: AC
  Administered 2015-11-02: 378 mg via INTRAVENOUS
  Filled 2015-11-02: qty 18

## 2015-11-02 MED ORDER — SODIUM CHLORIDE 0.9 % IV SOLN
Freq: Once | INTRAVENOUS | Status: AC
  Administered 2015-11-02: 11:00:00 via INTRAVENOUS

## 2015-11-02 MED ORDER — ACETAMINOPHEN 325 MG PO TABS
ORAL_TABLET | ORAL | Status: AC
  Filled 2015-11-02: qty 1

## 2015-11-02 MED ORDER — DIPHENHYDRAMINE HCL 25 MG PO CAPS
25.0000 mg | ORAL_CAPSULE | Freq: Once | ORAL | Status: AC
  Administered 2015-11-02: 25 mg via ORAL

## 2015-11-02 MED ORDER — DIPHENHYDRAMINE HCL 25 MG PO CAPS
ORAL_CAPSULE | ORAL | Status: AC
  Filled 2015-11-02: qty 1

## 2015-11-02 NOTE — Progress Notes (Signed)
Patient Care Team: [name], MD as PCP - General (Internal Medicine) [name], MD as Consulting Physician (General Surgery) [name], MD as Consulting Physician (Hematology and Oncology)  DIAGNOSIS: No matching staging information was found for the patient.  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      02/03/2015 Surgery    Right lumpectomy: Invasive ductal carcinoma grade 2, 2.1 cm, with associated DCIS, margins are negative, 0/1 lymph node, ER 0%, PR 0%, HER-2 positive ratio 2.31, T2 N0 stage II a      03/09/2015 - 06/22/2015 Chemotherapy    Adjuvant chemotherapy with [place] 6 followed by Herceptin maintenance for 1 year      08/11/2015 - 09/22/2015 Radiation Therapy    Adjuvant radiation therapy       CHIEF COMPLIANT: Surveillance of breast cancer  INTERVAL HISTORY: Shawna [name] is a 75 year old with above-mentioned history of right breast cancer treated with lumpectomy adjuvant chemotherapy and radiation. She is here for surveillance. She complains of neuropathy continues to be a problem in her feet. Denies any nausea vomiting. Energy levels are improving. She is starting to exercise more often.  REVIEW OF SYSTEMS:   Constitutional: Denies fevers, chills or abnormal weight loss Eyes: Denies blurriness of vision Ears, nose, mouth, throat, and face: Denies mucositis or sore throat Respiratory: Denies cough, dyspnea or wheezes Cardiovascular: Denies palpitation, chest discomfort Gastrointestinal:  Denies nausea, heartburn or change in bowel habits Skin:  Denies abnormal skin rashes Lymphatics: Denies new lymphadenopathy or easy bruising Neurological:Denies numbness, tingling or new weaknesses Behavioral/Psych: Mood is stable, no new changes  Extremities: No lower extremity edema Breast:  denies any pain or lumps or nodules in either breasts All other systems were reviewed with the patient and are negative.  I have reviewed the past medical history, past surgical history, social history and family history with the patient and they are unchanged from previous note.  ALLERGIES:  has No Known Allergies.  MEDICATIONS:  Current Outpatient Prescriptions  Medication Sig Dispense Refill  . ALPRAZolam (XANAX) 0.25 MG tablet Take 0.25-0.5 mg by mouth 2 (two) times daily as needed for anxiety.    . Calcium Carbonate-Vitamin D (CALCIUM 600 + D PO) Take 1 tablet by mouth daily.     . CRESTOR 20 MG tablet Take 1 tablet by mouth daily.  0  . gabapentin (NEURONTIN) 300 MG capsule Take 1 capsule (300 mg total) by mouth at bedtime. 30 capsule 6  . hydrochlorothiazide (MICROZIDE) 12.5 MG capsule Take 12.5 mg by mouth every other day.     . lidocaine-prilocaine (EMLA) cream Apply to affected area once 30 g 3  . telmisartan (MICARDIS) 80 MG tablet Take 1 tablet (80 mg total) by mouth daily.    Marland Kitchen tolterodine (DETROL) 2 MG tablet Take 2 mg by mouth at bedtime.    . Vitamin D, Ergocalciferol, (DRISDOL) 50000 units CAPS capsule Take 50,000 Units by mouth 2 (two) times a week. Monday and Thursday    . zoledronic acid (RECLAST) 5 MG/100ML SOLN Inject 5  mg into the vein once. annually      No current facility-administered medications for this visit.     PHYSICAL EXAMINATION: ECOG PERFORMANCE STATUS: 1 - Symptomatic but completely ambulatory  Vitals:   11/02/15 0942  BP: 139/76  Pulse: 73  Resp: 18  Temp: 97.7 F (36.5 C)   Filed Weights   11/02/15 0942  Weight: 128 lb 4.8 oz (58.2 kg)    GENERAL:alert, no distress and comfortable SKIN: skin color,  texture, turgor are normal, no rashes or significant lesions EYES: normal, Conjunctiva are pink and non-injected, sclera clear OROPHARYNX:no exudate, no erythema and lips, buccal mucosa, and tongue normal  NECK: supple, thyroid normal size, non-tender, without nodularity LYMPH:  no palpable lymphadenopathy in the cervical, axillary or inguinal LUNGS: clear to auscultation and percussion with normal breathing effort HEART: regular rate & rhythm and no murmurs and no lower extremity edema ABDOMEN:abdomen soft, non-tender and normal bowel sounds MUSCULOSKELETAL:no cyanosis of digits and no clubbing  NEURO: Neuropathy in the feet EXTREMITIES: No lower extremity edema BREAST: No palpable masses or nodules in either right or left breasts. No palpable axillary supraclavicular or infraclavicular adenopathy no breast tenderness or nipple discharge. (exam performed in the presence of a chaperone)  LABORATORY DATA:  I have reviewed the data as listed   Chemistry      Component Value Date/Time   NA 142 09/22/2015 0902   K 3.7 09/22/2015 0902   CL 108 01/29/2015 1545   CO2 24 09/22/2015 0902   BUN 21.9 09/22/2015 0902   CREATININE 0.9 09/22/2015 0902      Component Value Date/Time   CALCIUM 9.4 09/22/2015 0902   ALKPHOS 183 (H) 09/22/2015 0902   AST 38 (H) 09/22/2015 0902   ALT 46 09/22/2015 0902   BILITOT 0.70 09/22/2015 0902       Lab Results  Component Value Date   WBC 3.2 (L) 11/02/2015   HGB 11.6 11/02/2015   HCT 34.2 (L) 11/02/2015   MCV 99.4 11/02/2015   PLT 157 11/02/2015   NEUTROABS 2.3 11/02/2015     ASSESSMENT & PLAN:  Breast cancer of lower-inner quadrant of right female breast ([place]) Right lumpectomy 02/03/2015: Invasive ductal carcinoma grade 2, 2.1 cm, with associated DCIS, margins are negative, 0/1 lymph node, ER 0%, PR 0%, HER-2 positiveratio 2.31, T2 N0 stage II a. (Right breast biopsy 3:30 position 12/09/2014: Invasive ductal carcinoma grade 3, ER 0%, PR 0%,  HER-2 negativeratio 1.5, Ki-67 90%, 1 cm irregular mass T1C N0 stage IA clinical stage) Genetic counseling revealed NBN mutation  Treatment summary: 1. Adjuvant chemotherapy with [facility] 6 cycles started 03/09/15 completed 06/22/15 now onHerceptin maintenance for 1 year  2. Followed by radiation therapy 08/11/2015 to 09/22/2015 -------------------------------------------------------------------------------------------------------------------------------------------------------- Current Treatment: Herceptin maintenance ECHO 4/17: EF 65-70% (patient will need new echo) Monitoring closely for toxicities I reviewed her blood work.  Patient's primary care physician is helping her adjust her blood pressure medications and treat the leg swelling. 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 We will have to closely watch and monitor her neuropathy symptoms. I do not believe that Herceptin is causing her neuropathy.   Nail changes: Fever the nails are healing on the nails actually came off yesterday on her toes. These are related to prior chemotherapy.  RTC in 3 weeks for Herceptin maintenance in 6 weeks for follow-up   No orders of the defined types were placed in this encounter.  The patient has a  good understanding of the overall plan. she agrees with it. she will call with any problems that may develop before the next visit here.   Rulon Eisenmenger, MD 11/02/15

## 2015-11-02 NOTE — Assessment & Plan Note (Signed)
Right lumpectomy 02/03/2015: Invasive ductal carcinoma grade 2, 2.1 cm, with associated DCIS, margins are negative, 0/1 lymph node, ER 0%, PR 0%, HER-2 positiveratio 2.31, T2 N0 stage II a. 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 Adjuvant chemotherapy with TCH 6 cycles started 03/09/15 completed 06/22/15 now onHerceptin maintenance for 1 year  2. Followed by radiation therapy 08/11/2015 to 09/22/2015 -------------------------------------------------------------------------------------------------------------------------------------------------------- Current Treatment: Herceptin maintenance ECHO 4/17: EF 65-70% (patient will need new echo) Monitoring closely for toxicities I reviewed her blood work.  Leg swelling: I will get an ultrasound of the legs to evaluate and rule out DVT. Patient's primary care physician is helping her adjust her blood pressure medications and treat the leg swelling. 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 We will have to closely watch and monitor her neuropathy symptoms. I do not believe that Herceptin is causing her neuropathy.   Nail changes: Fever the nails are healing on the nails actually came off yesterday on her toes. These are related to prior chemotherapy.  RTC in 3 weeks for Herceptin maintenance in 6 weeks for follow-up

## 2015-11-02 NOTE — Patient Instructions (Signed)
[facility] Discharge Instructions for Patients Receiving Chemotherapy  Today you received the following chemotherapy agents:  Herceptin  To help prevent nausea and vomiting after your treatment, we encourage you to take your nausea medication as prescribed.   If you develop nausea and vomiting that is not controlled by your nausea medication, call the clinic.   BELOW ARE SYMPTOMS THAT SHOULD BE REPORTED IMMEDIATELY:  *FEVER GREATER THAN 100.5 F  *CHILLS WITH OR WITHOUT FEVER  NAUSEA AND VOMITING THAT IS NOT CONTROLLED WITH YOUR NAUSEA MEDICATION  *UNUSUAL SHORTNESS OF BREATH  *UNUSUAL BRUISING OR BLEEDING  TENDERNESS IN MOUTH AND THROAT WITH OR WITHOUT PRESENCE OF ULCERS  *URINARY PROBLEMS  *BOWEL PROBLEMS  UNUSUAL RASH Items with * indicate a potential emergency and should be followed up as soon as possible.  Feel free to call the clinic you have any questions or concerns. The clinic phone number is [phone].  Please show the CHEMO ALERT CARD at check-in to the Emergency Department and triage nurse.   

## 2015-11-02 NOTE — Progress Notes (Signed)
Received copy of HCPOA.  Chart updated and document sent to scan.

## 2015-11-03 DIAGNOSIS — R152 Fecal urgency: Secondary | ICD-10-CM | POA: Diagnosis not present

## 2015-11-03 DIAGNOSIS — E784 Other hyperlipidemia: Secondary | ICD-10-CM | POA: Diagnosis not present

## 2015-11-03 DIAGNOSIS — M810 Age-related osteoporosis without current pathological fracture: Secondary | ICD-10-CM | POA: Diagnosis not present

## 2015-11-03 DIAGNOSIS — Z681 Body mass index (BMI) 19 or less, adult: Secondary | ICD-10-CM | POA: Diagnosis not present

## 2015-11-03 DIAGNOSIS — E559 Vitamin D deficiency, unspecified: Secondary | ICD-10-CM | POA: Diagnosis not present

## 2015-11-03 DIAGNOSIS — D6489 Other specified anemias: Secondary | ICD-10-CM | POA: Diagnosis not present

## 2015-11-03 DIAGNOSIS — I10 Essential (primary) hypertension: Secondary | ICD-10-CM | POA: Diagnosis not present

## 2015-11-03 DIAGNOSIS — Z0000 Encounter for general adult medical examination without abnormal findings: Secondary | ICD-10-CM | POA: Diagnosis not present

## 2015-11-03 DIAGNOSIS — G6289 Other specified polyneuropathies: Secondary | ICD-10-CM | POA: Diagnosis not present

## 2015-11-03 DIAGNOSIS — C50311 Malignant neoplasm of lower-inner quadrant of right female breast: Secondary | ICD-10-CM | POA: Diagnosis not present

## 2015-11-03 DIAGNOSIS — D649 Anemia, unspecified: Secondary | ICD-10-CM | POA: Insufficient documentation

## 2015-11-06 ENCOUNTER — Telehealth (HOSPITAL_COMMUNITY): Payer: Self-pay | Admitting: Vascular Surgery

## 2015-11-06 DIAGNOSIS — Z1212 Encounter for screening for malignant neoplasm of rectum: Secondary | ICD-10-CM | POA: Diagnosis not present

## 2015-11-06 NOTE — Telephone Encounter (Signed)
Left pt message to make f/u appt w/ DB /ECHO

## 2015-11-16 ENCOUNTER — Ambulatory Visit (INDEPENDENT_AMBULATORY_CARE_PROVIDER_SITE_OTHER): Payer: Medicare Other | Admitting: Nurse Practitioner

## 2015-11-16 ENCOUNTER — Encounter: Payer: Self-pay | Admitting: Nurse Practitioner

## 2015-11-16 ENCOUNTER — Telehealth: Payer: Self-pay | Admitting: Nurse Practitioner

## 2015-11-16 VITALS — BP 136/84 | HR 72 | Ht 69.0 in | Wt 130.0 lb

## 2015-11-16 DIAGNOSIS — N812 Incomplete uterovaginal prolapse: Secondary | ICD-10-CM | POA: Diagnosis not present

## 2015-11-16 LAB — POCT URINALYSIS DIPSTICK
BILIRUBIN UA: NEGATIVE
Blood, UA: NEGATIVE
GLUCOSE UA: NEGATIVE
Ketones, UA: NEGATIVE
Leukocytes, UA: NEGATIVE
NITRITE UA: NEGATIVE
Protein, UA: NEGATIVE
UROBILINOGEN UA: NEGATIVE
pH, UA: 5

## 2015-11-16 NOTE — Telephone Encounter (Signed)
Spoke with patient. Patient scheduled for today, 11/16/15 at 12:45 pm with Kem Boroughs, NP. Patient is agreeable to date and time.    Routing to provider for final review. Patient is agreeable to disposition. Will close encounter.

## 2015-11-16 NOTE — Patient Instructions (Signed)

## 2015-11-16 NOTE — Telephone Encounter (Signed)
Spoke with patient. Patient states she feels a bulge intermittently when urinating and having difficulty urinating. Patient states she has urge to void but only dribbles. Patient states this has been intermittent since it started on 11/13/15. Patient reports lower back discomfort that started at the same time. Advised patient she would need to be seen in office for further evaluation. Advised patient no appointment times available, would need to speak with provider and return call. Patient is agreeable.   Kem Boroughs, NP please advise?

## 2015-11-16 NOTE — Telephone Encounter (Signed)
Patient called and said, "I have some things going on I think my be related to prolapse. I'd like to speak with the nurse about my symptoms to see if she thinks I should be seen for an appointment."

## 2015-11-16 NOTE — Progress Notes (Signed)
75 y.o. Married Caucasian female G2P2002 here for evaluation of pelvic prolapse.   Patient states she feels a "bulge" intermittently when urinating and having difficulty urinating. Patient states she has urge to void but only dribbles. Patient states this has been intermittent since it started on 11/13/15. Patient reports lower back discomfort that started at the same time.  Denies any lifting, pulling or activity that has caused these symptoms.  She has completed her right breast cancer treatment with lumpectomy, chemotherapy and radiation.  O: Healthy WD,WN female Affect: normal, no distress  Abdomen:soft, non tender Pelvic exam:EXTERNAL GENITALIA: normal appearing vulva with no masses, tenderness or lesions VAGINA: no abnormal discharge or lesions She has pelvic organ prolapse with a cystocele which is mild, rectocele grade II with prolapse just beyond the introitus.  No mass and no bladder tenderness.  Urine: negative  A: Pelvic organ prolapse  G2, P2  Right breast cancer grade 2 with DCIS and E/PR negative and HER2 positive, lumpectomy 02/03/15, chemo & radiation   P:  Discussed findings of prolapse and recommend that she see Dr. Quincy Simmonds for evaluation and see if intervention needs to be done. Discussed briefly of pessary use.   Labs :  none  RV

## 2015-11-17 NOTE — Progress Notes (Signed)
Encounter reviewed by Dr. Aundria Rud. Urine dip negative in office. I am happy to evaluate the patient.

## 2015-11-19 NOTE — Progress Notes (Signed)
GYNECOLOGY  VISIT   HPI: 75 y.o.   Widowed  Caucasian  female   G2P2002 with Patient's last menstrual period was 01/17/1982 (approximate).   here for pelvic prolapse.     Notices that some thing was falling out of the vagina last week. She pushed it back up.  Was voiding more often before before she felt the bulge.  Now has intermittent bulge and intermittent difficulty voiding.  Has urinary urgency with need to void every 30 minutes and can void well or only dribbles. Denies dysuria.  Urine dip negative on 11/16/15.  Is currently on Detrol through PCP for the last two months.  Used Vesicare in the past through urology.  Stopped the Vesicare once and had urinary frequency.  Hx renal stones.   A little bit of leak with cough, laugh, sneeze, run/jump.  No pad use but has some accidents.  No constipation.  Had fecal incontinence in the past.   Has prolapse on exam with cystocele and rectocele.   Just finishing breast cancer treatment in January.  Has port a cath in place.   Loves her coffee - one per day.  No tea or soda.   2 vaginal deliveries.  7 pounds 7 ounces and 7 pounds 14 ounces.   GYNECOLOGIC HISTORY: Patient's last menstrual period was 01/17/1982 (approximate). Contraception: Postmenopausal Menopausal hormone therapy:  none Last mammogram: 12/09/14 with invasive ductal carcinoma right breast Last pap smear:   02-24-15 Neg        OB History    Gravida Para Term Preterm AB Living   2 2 2  0 0 2   SAB TAB Ectopic Multiple Live Births   0 0 0 0 2         Patient Active Problem List   Diagnosis Date Noted  . Monoallelic mutation of NBN gene   . Genetic testing 01/13/2015  . Family history of breast cancer   . Family history of colon cancer   . Breast cancer of lower-inner quadrant of right female breast ([place]) 12/30/2014    Past Medical History:  Diagnosis Date  . Breast cancer ([place]) 11/2014  . Family history of breast cancer   . Family history of colon  cancer   . History of radiation therapy 08/11/15- 09/23/2015   Right Breast  . Hyperlipidemia   . Hypertension   . Kidney stone on left side   . Monoallelic mutation of NBN gene   . Osteoporosis   . Skin cancer of nose     Past Surgical History:  Procedure Laterality Date  . APPENDECTOMY    . BREAST LUMPECTOMY WITH RADIOACTIVE SEED AND SENTINEL LYMPH NODE BIOPSY Right 02/03/2015   Procedure: RIGHT BREAST LUMPECTOMY WITH RADIOACTIVE SEED AND RIGHT SENTINEL LYMPH NODE BIOPSY;  Surgeon: [name], MD;  Location: [place];  Service: General;  Laterality: Right;  . BREAST SURGERY  11/2012   biopsy, benign breast tissue  . COLONOSCOPY  06/2010   rec  . LITHOTRIPSY     kidney stone on left side  . OVARIAN CYST REMOVAL  age 47  . POLYPECTOMY  04/2005   colon  . PORTACATH PLACEMENT Left 02/03/2015   Procedure: INSERTION PORT-A-CATH WITH ULTRA SOUND GUIDANCE;  Surgeon: [name], MD;  Location: [place];  Service: General;  Laterality: Left;    Current Outpatient Prescriptions  Medication Sig Dispense Refill  . ALPRAZolam (XANAX) 0.25 MG tablet Take 0.25-0.5 mg by mouth 2 (two) times daily as needed  for anxiety.    . Calcium Carbonate-Vitamin D (CALCIUM 600 + D PO) Take 1 tablet by mouth daily.     . CRESTOR 20 MG tablet Take 1 tablet by mouth daily.  0  . gabapentin (NEURONTIN) 300 MG capsule Take 1 capsule (300 mg total) by mouth at bedtime. 30 capsule 6  . hydrochlorothiazide (MICROZIDE) 12.5 MG capsule Take 12.5 mg by mouth every other day.     . lidocaine-prilocaine (EMLA) cream Apply to affected area once 30 g 3  . losartan (COZAAR) 100 MG tablet Take 100 mg by mouth daily.  0  . tolterodine (DETROL) 2 MG tablet Take 2 mg by mouth at bedtime.    . Vitamin D, Ergocalciferol, (DRISDOL) 50000 units CAPS capsule Take 50,000 Units by mouth 2 (two) times a week. Monday and Thursday    . zoledronic acid (RECLAST) 5 MG/100ML SOLN Inject 5 mg into the vein  once. annually      No current facility-administered medications for this visit.      ALLERGIES: Review of patient's allergies indicates no known allergies.  Family History  Problem Relation Age of Onset  . Colon cancer Sister 66  . Breast cancer Mother 80  . Hypertension Mother   . Osteoporosis Mother   . Heart disease Father     CABG  . Dementia Father   . Throat cancer Brother 21  . Breast cancer Sister 87  . Lymphoma Sister 47  . Melanoma Sister   . Skin cancer Brother   . Skin cancer Daughter     Social History   Social History  . Marital status: Widowed    Spouse name: N/A  . Number of children: 2  . Years of education: N/A   Occupational History  . Not on file.   Social History Main Topics  . Smoking status: Never Smoker  . Smokeless tobacco: Never Used  . Alcohol use 4.2 oz/week    7 Glasses of wine per week     Comment: 6-7 glasses of wine a week  . Drug use: No  . Sexual activity: No   Other Topics Concern  . Not on file   Social History Narrative  . No narrative on file    ROS:  Pertinent items are noted in HPI.  PHYSICAL EXAMINATION:    BP 120/68 (BP Location: Left Arm, Patient Position: Sitting, Cuff Size: Normal)   Pulse 80   Ht 5\' 9"  (1.753 m)   Wt 130 lb (59 kg)   LMP 01/17/1982 (Approximate)   BMI 19.20 kg/m     General appearance: alert, cooperative and appears stated age  Pelvic: External genitalia:  no lesions              Urethra:  normal appearing urethra with no masses, tenderness or lesions              Bartholins and Skenes: normal                 Vagina: normal appearing vagina with normal color and discharge, no lesions.  Third degree cystocele, almost second degree uterine prolapse, and first degree rectocele.              Cervix: no lesions                Bimanual Exam:  Uterus:  normal size, contour, position, consistency, mobility, non-tender              Adnexa: no mass, fullness,  tenderness              Rectal  exam: Yes.  .  Confirms.              Anus:  normal sphincter tone, no lesions  Bladder catheterization: Verbal consent. Sterile prep with betadine.  Catheterization performed and 135 cc clear, yellow urine obtained.  Tolerated well and without problems.  Chaperone was present for exam.  ASSESSMENT  Incomplete uterovaginal prolapse. Intermittent voiding dysfunction with urinary retention. This is likely a function of both her prolapse and possible the Detrol LA.  Her residual amount today is acceptable. Current chemotherapy for breast cancer.   PLAN  We discussed pelvic organ prolapse - etiologies, symptoms, and treatment options.  We discussed the effect of the prolapse and Detrol on her voiding pattern.  We reviewed treatments of pessary, PT, and surgical care with either a vaginal, abdominal, or laparoscopic reconstruction procedure.  She will proceed with pessary care and return for a visit for fitting.  If her voiding does not improve immediately, she will need to stop the Detrol.  We will consider surgery in the future if the pessary is not acceptable.  She needs to finish her breast cancer care right now.   An After Visit Summary was printed and given to the patient.  __25____ minutes face to face time of which over 50% was spent in counseling.

## 2015-11-20 ENCOUNTER — Ambulatory Visit (INDEPENDENT_AMBULATORY_CARE_PROVIDER_SITE_OTHER): Payer: Medicare Other | Admitting: Obstetrics and Gynecology

## 2015-11-20 ENCOUNTER — Encounter: Payer: Self-pay | Admitting: Obstetrics and Gynecology

## 2015-11-20 VITALS — BP 120/68 | HR 80 | Ht 69.0 in | Wt 130.0 lb

## 2015-11-20 DIAGNOSIS — R339 Retention of urine, unspecified: Secondary | ICD-10-CM | POA: Diagnosis not present

## 2015-11-20 DIAGNOSIS — N812 Incomplete uterovaginal prolapse: Secondary | ICD-10-CM | POA: Diagnosis not present

## 2015-11-23 ENCOUNTER — Ambulatory Visit (HOSPITAL_BASED_OUTPATIENT_CLINIC_OR_DEPARTMENT_OTHER): Payer: Medicare Other

## 2015-11-23 VITALS — BP 132/78 | HR 70 | Temp 98.5°F | Resp 17

## 2015-11-23 DIAGNOSIS — Z5112 Encounter for antineoplastic immunotherapy: Secondary | ICD-10-CM

## 2015-11-23 DIAGNOSIS — C50311 Malignant neoplasm of lower-inner quadrant of right female breast: Secondary | ICD-10-CM | POA: Diagnosis not present

## 2015-11-23 MED ORDER — ACETAMINOPHEN 325 MG PO TABS
ORAL_TABLET | ORAL | Status: AC
  Filled 2015-11-23: qty 2

## 2015-11-23 MED ORDER — TRASTUZUMAB CHEMO 150 MG IV SOLR
6.0000 mg/kg | Freq: Once | INTRAVENOUS | Status: AC
  Administered 2015-11-23: 378 mg via INTRAVENOUS
  Filled 2015-11-23: qty 18

## 2015-11-23 MED ORDER — HEPARIN SOD (PORK) LOCK FLUSH 100 UNIT/ML IV SOLN
500.0000 [IU] | Freq: Once | INTRAVENOUS | Status: AC | PRN
  Administered 2015-11-23: 500 [IU]
  Filled 2015-11-23: qty 5

## 2015-11-23 MED ORDER — SODIUM CHLORIDE 0.9 % IV SOLN
Freq: Once | INTRAVENOUS | Status: AC
  Administered 2015-11-23: 10:00:00 via INTRAVENOUS

## 2015-11-23 MED ORDER — SODIUM CHLORIDE 0.9% FLUSH
10.0000 mL | INTRAVENOUS | Status: DC | PRN
  Administered 2015-11-23: 10 mL
  Filled 2015-11-23: qty 10

## 2015-11-23 MED ORDER — DIPHENHYDRAMINE HCL 25 MG PO CAPS
ORAL_CAPSULE | ORAL | Status: AC
  Filled 2015-11-23: qty 1

## 2015-11-23 MED ORDER — ACETAMINOPHEN 325 MG PO TABS
325.0000 mg | ORAL_TABLET | Freq: Once | ORAL | Status: AC
  Administered 2015-11-23: 325 mg via ORAL

## 2015-11-23 MED ORDER — DIPHENHYDRAMINE HCL 25 MG PO CAPS
25.0000 mg | ORAL_CAPSULE | Freq: Once | ORAL | Status: AC
  Administered 2015-11-23: 25 mg via ORAL

## 2015-11-23 NOTE — Patient Instructions (Signed)
[facility] Discharge Instructions for Patients Receiving Chemotherapy  Today you received the following chemotherapy agents:  Herceptin  To help prevent nausea and vomiting after your treatment, we encourage you to take your nausea medication as prescribed.   If you develop nausea and vomiting that is not controlled by your nausea medication, call the clinic.   BELOW ARE SYMPTOMS THAT SHOULD BE REPORTED IMMEDIATELY:  *FEVER GREATER THAN 100.5 F  *CHILLS WITH OR WITHOUT FEVER  NAUSEA AND VOMITING THAT IS NOT CONTROLLED WITH YOUR NAUSEA MEDICATION  *UNUSUAL SHORTNESS OF BREATH  *UNUSUAL BRUISING OR BLEEDING  TENDERNESS IN MOUTH AND THROAT WITH OR WITHOUT PRESENCE OF ULCERS  *URINARY PROBLEMS  *BOWEL PROBLEMS  UNUSUAL RASH Items with * indicate a potential emergency and should be followed up as soon as possible.  Feel free to call the clinic you have any questions or concerns. The clinic phone number is [phone].  Please show the CHEMO ALERT CARD at check-in to the Emergency Department and triage nurse.   

## 2015-11-27 ENCOUNTER — Ambulatory Visit (INDEPENDENT_AMBULATORY_CARE_PROVIDER_SITE_OTHER): Payer: Medicare Other | Admitting: Obstetrics and Gynecology

## 2015-11-27 ENCOUNTER — Encounter: Payer: Self-pay | Admitting: Obstetrics and Gynecology

## 2015-11-27 VITALS — BP 122/78 | HR 70 | Ht 69.0 in | Wt 131.0 lb

## 2015-11-27 DIAGNOSIS — N812 Incomplete uterovaginal prolapse: Secondary | ICD-10-CM

## 2015-11-27 NOTE — Progress Notes (Signed)
GYNECOLOGY  VISIT   HPI: 75 y.o.   Widowed  Caucasian  female   G2P2002 with Patient's last menstrual period was 01/17/1982 (approximate).   here for pessary fitting.    Some difficulty voiding.  Some mild stress incontinence.  Known cystocele and rectocele.   GYNECOLOGIC HISTORY: Patient's last menstrual period was 01/17/1982 (approximate). Contraception:  Postmenopausal Menopausal hormone therapy:  none Last mammogram:  12/09/14 with invasive ductal carcinoma right breast Last pap smear:    02-24-15 Neg        OB History    Gravida Para Term Preterm AB Living   2 2 2  0 0 2   SAB TAB Ectopic Multiple Live Births   0 0 0 0 2         Patient Active Problem List   Diagnosis Date Noted  . Monoallelic mutation of NBN gene   . Genetic testing 01/13/2015  . Family history of breast cancer   . Family history of colon cancer   . Breast cancer of lower-inner quadrant of right female breast ([place]) 12/30/2014    Past Medical History:  Diagnosis Date  . Breast cancer ([place]) 11/2014  . Family history of breast cancer   . Family history of colon cancer   . History of radiation therapy 08/11/15- 09/23/2015   Right Breast  . Hyperlipidemia   . Hypertension   . Kidney stone on left side   . Monoallelic mutation of NBN gene   . Osteoporosis   . Skin cancer of nose     Past Surgical History:  Procedure Laterality Date  . APPENDECTOMY    . BREAST LUMPECTOMY WITH RADIOACTIVE SEED AND SENTINEL LYMPH NODE BIOPSY Right 02/03/2015   Procedure: RIGHT BREAST LUMPECTOMY WITH RADIOACTIVE SEED AND RIGHT SENTINEL LYMPH NODE BIOPSY;  Surgeon: [name], MD;  Location: [facility];  Service: General;  Laterality: Right;  . BREAST SURGERY  11/2012   biopsy, benign breast tissue  . COLONOSCOPY  06/2010   rec  . LITHOTRIPSY     kidney stone on left side  . OVARIAN CYST REMOVAL  age 32  . POLYPECTOMY  04/2005   colon  . PORTACATH PLACEMENT Left 02/03/2015   Procedure: INSERTION  PORT-A-CATH WITH ULTRA SOUND GUIDANCE;  Surgeon: [name], MD;  Location: [facility];  Service: General;  Laterality: Left;    Current Outpatient Prescriptions  Medication Sig Dispense Refill  . ALPRAZolam (XANAX) 0.25 MG tablet Take 0.25-0.5 mg by mouth 2 (two) times daily as needed for anxiety.    . Calcium Carbonate-Vitamin D (CALCIUM 600 + D PO) Take 1 tablet by mouth daily.     . CRESTOR 20 MG tablet Take 1 tablet by mouth daily.  0  . gabapentin (NEURONTIN) 300 MG capsule Take 1 capsule (300 mg total) by mouth at bedtime. 30 capsule 6  . hydrochlorothiazide (MICROZIDE) 12.5 MG capsule Take 12.5 mg by mouth every other day.     . lidocaine-prilocaine (EMLA) cream Apply to affected area once 30 g 3  . losartan (COZAAR) 100 MG tablet Take 100 mg by mouth daily.  0  . tolterodine (DETROL) 2 MG tablet Take 2 mg by mouth at bedtime.    . Vitamin D, Ergocalciferol, (DRISDOL) 50000 units CAPS capsule Take 50,000 Units by mouth 2 (two) times a week. Monday and Thursday    . zoledronic acid (RECLAST) 5 MG/100ML SOLN Inject 5 mg into the vein once. annually      No  current facility-administered medications for this visit.      ALLERGIES: Patient has no known allergies.  Family History  Problem Relation Age of Onset  . Colon cancer Sister 68  . Breast cancer Mother 48  . Hypertension Mother   . Osteoporosis Mother   . Heart disease Father     CABG  . Dementia Father   . Throat cancer Brother 60  . Breast cancer Sister 9  . Lymphoma Sister 71  . Melanoma Sister   . Skin cancer Brother   . Skin cancer Daughter     Social History   Social History  . Marital status: Widowed    Spouse name: N/A  . Number of children: 2  . Years of education: N/A   Occupational History  . Not on file.   Social History Main Topics  . Smoking status: Never Smoker  . Smokeless tobacco: Never Used  . Alcohol use 4.2 oz/week    7 Glasses of wine per week     Comment: 6-7  glasses of wine a week  . Drug use: No  . Sexual activity: No   Other Topics Concern  . Not on file   Social History Narrative  . No narrative on file    ROS:  Pertinent items are noted in HPI.  PHYSICAL EXAMINATION:    BP 122/78 (BP Location: Left Arm, Patient Position: Sitting, Cuff Size: Normal)   Pulse 70   Ht 5\' 9"  (1.753 m)   Wt 131 lb (59.4 kg)   LMP 01/17/1982 (Approximate)   BMI 19.35 kg/m     General appearance: alert, cooperative and appears stated age   Pelvic: External genitalia:  no lesions              Urethra:  normal appearing urethra with no masses, tenderness or lesions              Bartholins and Skenes: normal                 Vagina: normal appearing vagina with normal color and discharge, no lesions              Cervix: no lesions                Bimanual Exam:  Uterus:  normal size, contour, position, consistency, mobility, non-tender              Adnexa: no mass, fullness, tenderness                Chaperone was present for exam.  ASSESSMENT  Cystocele and rectocele.   PLAN  Pessary fitting performed.  #3 ring with support pessary comfortable with sitting and maneuvers, and this will be ordered.  #4 ring with support a little uncomfortable - back pain and some pressure. Return in about 2 weeks to receive pessary.  Discussed options for patient to care for pessary or for her to return to the office for [place] to manage this.    An After Visit Summary was printed and given to the patient.  ___15___ minutes face to face time of which over 50% was spent in counseling.

## 2015-12-03 ENCOUNTER — Ambulatory Visit (HOSPITAL_COMMUNITY)
Admission: RE | Admit: 2015-12-03 | Discharge: 2015-12-03 | Disposition: A | Discharge status: Home / Self Care | Payer: Medicare Other | Source: Ambulatory Visit | Attending: Internal Medicine | Admitting: Internal Medicine

## 2015-12-03 ENCOUNTER — Encounter (HOSPITAL_COMMUNITY): Payer: Self-pay | Admitting: Internal Medicine

## 2015-12-03 ENCOUNTER — Ambulatory Visit (HOSPITAL_BASED_OUTPATIENT_CLINIC_OR_DEPARTMENT_OTHER)
Admission: RE | Admit: 2015-12-03 | Discharge: 2015-12-03 | Disposition: A | Discharge status: Home / Self Care | Payer: Medicare Other | Source: Ambulatory Visit | Attending: Internal Medicine | Admitting: Internal Medicine

## 2015-12-03 ENCOUNTER — Telehealth: Payer: Self-pay

## 2015-12-03 ENCOUNTER — Other Ambulatory Visit (HOSPITAL_COMMUNITY): Payer: Self-pay | Admitting: *Deleted

## 2015-12-03 VITALS — BP 124/80 | HR 63 | Wt 129.8 lb

## 2015-12-03 DIAGNOSIS — Z8249 Family history of ischemic heart disease and other diseases of the circulatory system: Secondary | ICD-10-CM | POA: Insufficient documentation

## 2015-12-03 DIAGNOSIS — Z808 Family history of malignant neoplasm of other organs or systems: Secondary | ICD-10-CM | POA: Diagnosis not present

## 2015-12-03 DIAGNOSIS — C50011 Malignant neoplasm of nipple and areola, right female breast: Secondary | ICD-10-CM | POA: Insufficient documentation

## 2015-12-03 DIAGNOSIS — Z09 Encounter for follow-up examination after completed treatment for conditions other than malignant neoplasm: Secondary | ICD-10-CM | POA: Insufficient documentation

## 2015-12-03 DIAGNOSIS — Z803 Family history of malignant neoplasm of breast: Secondary | ICD-10-CM | POA: Insufficient documentation

## 2015-12-03 DIAGNOSIS — Z800 Family history of malignant neoplasm of digestive organs: Secondary | ICD-10-CM | POA: Insufficient documentation

## 2015-12-03 DIAGNOSIS — Z79899 Other long term (current) drug therapy: Secondary | ICD-10-CM | POA: Diagnosis not present

## 2015-12-03 DIAGNOSIS — G629 Polyneuropathy, unspecified: Secondary | ICD-10-CM | POA: Diagnosis not present

## 2015-12-03 DIAGNOSIS — I10 Essential (primary) hypertension: Secondary | ICD-10-CM | POA: Diagnosis not present

## 2015-12-03 NOTE — Telephone Encounter (Signed)
Called patient on [phone] and cell#[phone] to make appointment for pessary insertion. Left message for patient to call me.

## 2015-12-03 NOTE — Telephone Encounter (Signed)
Patient is returning a call to Amanda.

## 2015-12-03 NOTE — Addendum Note (Signed)
Encounter addended by: Kennieth Rad, RN on: 12/03/2015 12:46 PM<BR>    Actions taken: Sign clinical note

## 2015-12-03 NOTE — Progress Notes (Signed)
*  PRELIMINARY RESULTS* Echocardiogram 2D Echocardiogram has been performed.  Shawna [name] 12/03/2015, 12:08 PM

## 2015-12-03 NOTE — Progress Notes (Signed)
Patient ID: Shawna [name], female   DOB: Dec 27, 1940, 75 y.o.   MRN: [id number]    Advanced Heart Failure CardioOncology Clinic Note   Referring Physician: Dr Lindi Adie Oncologist: Dr. Lindi Adie   HPI:  Shawna [name] is a 75 y.o. female with right breast cancer as below. Referred by Dr. Lindi Adie for enrollment into cardio-oncology clinic  Breast Cancer Profile:  Right lumpectomy 02/03/2015: Invasive ductal carcinoma grade 2, 2.1 cm, with associated DCIS, margins are negative, 0/1 lymph node, ER 0%, PR 0%, HER-2 positive ratio 2.31, T2 N0 stage II a. (Right breast biopsy 3:30 position 12/09/2014: Invasive ductal carcinoma grade 3, ER 0%, PR 0%, HER-2 negative ratio 1.5, Ki-67 90%, 1 cm irregular mass T1C N0 stage IA clinical stage) Genetic counseling revealed NBN mutation Treatment plan includes TCH x 6 cycles followed by herceptin maintenance for 1 year.   She presents today for routine follow up. Completes 6 cycles TCH and XRT. Now continues on herceptin and completes it in January.  Denies SOB/PND/Orthopnea. 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    Echo 01/30/15 65-70%, LS' 11.5, GLS -21.4% Echo 04/30/15 60-65%, LS' 11.4, GLS -20.3% Echo 12/03/15 60-65%, LS' 12.0, GLS -19.2%   Past Medical History:  Diagnosis Date  . Breast cancer ([place]) 11/2014  . Family history of breast cancer   . Family history of colon cancer   . History of radiation therapy 08/11/15- 09/23/2015   Right Breast  . Hyperlipidemia   . Hypertension   . Kidney stone on left side   . Monoallelic mutation of NBN gene   . Osteoporosis   . Skin cancer of nose     Current Outpatient Prescriptions  Medication Sig Dispense Refill  . Calcium Carbonate-Vitamin D (CALCIUM 600 + D PO) Take 1 tablet by mouth daily.     . CRESTOR 20 MG tablet Take 1 tablet by mouth daily.  0  . gabapentin (NEURONTIN) 300 MG capsule Take 1 capsule (300 mg total) by mouth at bedtime. 30 capsule 6  . hydrochlorothiazide (MICROZIDE) 12.5 MG  capsule Take 12.5 mg by mouth every other day.     . lidocaine-prilocaine (EMLA) cream Apply to affected area once 30 g 3  . losartan (COZAAR) 100 MG tablet Take 100 mg by mouth daily.  0  . tolterodine (DETROL) 2 MG tablet Take 2 mg by mouth at bedtime.    . Vitamin D, Ergocalciferol, (DRISDOL) 50000 units CAPS capsule Take 50,000 Units by mouth 2 (two) times a week. Monday and Thursday    . zoledronic acid (RECLAST) 5 MG/100ML SOLN Inject 5 mg into the vein once. annually     . ALPRAZolam (XANAX) 0.25 MG tablet Take 0.25-0.5 mg by mouth 2 (two) times daily as needed for anxiety.     No current facility-administered medications for this encounter.     No Known Allergies    Social History   Social History  . Marital status: Widowed    Spouse name: N/A  . Number of children: 2  . Years of education: N/A   Occupational History  . Not on file.   Social History Main Topics  . Smoking status: Never Smoker  . Smokeless tobacco: Never Used  . Alcohol use 4.2 oz/week    7 Glasses of wine per week     Comment: 6-7 glasses of wine a week  . Drug use: No  . Sexual activity: No   Other Topics Concern  . Not on file   Social History Narrative  .  No narrative on file      Family History  Problem Relation Age of Onset  . Colon cancer Sister 12  . Breast cancer Mother 20  . Hypertension Mother   . Osteoporosis Mother   . Heart disease Father     CABG  . Dementia Father   . Throat cancer Brother 19  . Breast cancer Sister 44  . Lymphoma Sister 17  . Melanoma Sister   . Skin cancer Brother   . Skin cancer Daughter     Vitals:   12/03/15 1212  BP: 124/80  Pulse: 63  SpO2: 97%  Weight: 129 lb 12 oz (58.9 kg)    PHYSICAL EXAM: General:  Well appearing. No respiratory difficulty HEENT: normal Neck: supple. no JVD. Carotids 2+ bilat; no bruits. No lymphadenopathy or thyromegaly appreciated. Cor: PMI nondisplaced. Regular rate & rhythm. No rubs, gallops or  murmurs. Lungs: clear Abdomen: soft, nontender, nondistended. No hepatosplenomegaly. No bruits or masses. Good bowel sounds. Extremities: no cyanosis, clubbing, rash, edema Neuro: alert & oriented x 3, cranial nerves grossly intact. moves all 4 extremities w/o difficulty. Affect pleasant.  ASSESSMENT & PLAN:   1. Right Breast Cancer - HER 2 -neu +      -Treatment plan includes TCH x 6 cycles followed by herceptin maintenance for 1 year.       - I reviewed echos personally. EF and Doppler parameters stable. No HF on exam. Continue Herceptin.   Shesis doing well. No evidence of cardio-toxicity.  Will finish Herceptin in January. We discussed possibility of coming for post Herceptin echo and she would prefer to defer. Will see her back as needed.  Shawna Kissner,MD 12:40 PM

## 2015-12-03 NOTE — Patient Instructions (Signed)
Follow up as needed

## 2015-12-04 NOTE — Telephone Encounter (Signed)
Spoke with patient and notified pessary is here and need to make appointment for insertion. Made appointment 12-14-15 at 4:00pm and patient to arrive at 3:45pm.

## 2015-12-14 ENCOUNTER — Encounter: Payer: Self-pay | Admitting: Hematology and Oncology

## 2015-12-14 ENCOUNTER — Ambulatory Visit (HOSPITAL_BASED_OUTPATIENT_CLINIC_OR_DEPARTMENT_OTHER): Payer: Medicare Other | Admitting: Hematology and Oncology

## 2015-12-14 ENCOUNTER — Ambulatory Visit (INDEPENDENT_AMBULATORY_CARE_PROVIDER_SITE_OTHER): Payer: Medicare Other | Admitting: Obstetrics and Gynecology

## 2015-12-14 ENCOUNTER — Encounter: Payer: Self-pay | Admitting: Obstetrics and Gynecology

## 2015-12-14 ENCOUNTER — Encounter: Payer: Self-pay | Admitting: *Deleted

## 2015-12-14 ENCOUNTER — Other Ambulatory Visit (HOSPITAL_BASED_OUTPATIENT_CLINIC_OR_DEPARTMENT_OTHER): Payer: Medicare Other

## 2015-12-14 ENCOUNTER — Ambulatory Visit (HOSPITAL_BASED_OUTPATIENT_CLINIC_OR_DEPARTMENT_OTHER): Payer: Medicare Other

## 2015-12-14 VITALS — BP 126/68 | HR 80 | Resp 16 | Ht 69.0 in | Wt 134.8 lb

## 2015-12-14 DIAGNOSIS — C50311 Malignant neoplasm of lower-inner quadrant of right female breast: Secondary | ICD-10-CM

## 2015-12-14 DIAGNOSIS — N816 Rectocele: Secondary | ICD-10-CM

## 2015-12-14 DIAGNOSIS — Z171 Estrogen receptor negative status [ER-]: Secondary | ICD-10-CM

## 2015-12-14 DIAGNOSIS — N8110 Cystocele, unspecified: Secondary | ICD-10-CM

## 2015-12-14 DIAGNOSIS — Z5112 Encounter for antineoplastic immunotherapy: Secondary | ICD-10-CM

## 2015-12-14 DIAGNOSIS — D6481 Anemia due to antineoplastic chemotherapy: Secondary | ICD-10-CM | POA: Diagnosis not present

## 2015-12-14 DIAGNOSIS — G629 Polyneuropathy, unspecified: Secondary | ICD-10-CM

## 2015-12-14 LAB — COMPREHENSIVE METABOLIC PANEL
ALT: 76 U/L — AB (ref 0–55)
ANION GAP: 9 meq/L (ref 3–11)
AST: 55 U/L — AB (ref 5–34)
Albumin: 3.4 g/dL — ABNORMAL LOW (ref 3.5–5.0)
Alkaline Phosphatase: 142 U/L (ref 40–150)
BILIRUBIN TOTAL: 0.66 mg/dL (ref 0.20–1.20)
BUN: 25.7 mg/dL (ref 7.0–26.0)
CO2: 25 meq/L (ref 22–29)
CREATININE: 0.9 mg/dL (ref 0.6–1.1)
Calcium: 9.3 mg/dL (ref 8.4–10.4)
Chloride: 109 mEq/L (ref 98–109)
EGFR: 64 mL/min/{1.73_m2} — ABNORMAL LOW (ref >90)
Glucose: 93 mg/dl (ref 70–140)
Potassium: 4.1 mEq/L (ref 3.5–5.1)
Sodium: 143 mEq/L (ref 136–145)
TOTAL PROTEIN: 6.3 g/dL — AB (ref 6.4–8.3)

## 2015-12-14 LAB — CBC WITH DIFFERENTIAL/PLATELET
BASO%: 1.3 % (ref 0.0–2.0)
Basophils Absolute: 0 10*3/uL (ref 0.0–0.1)
EOS%: 3.3 % (ref 0.0–7.0)
Eosinophils Absolute: 0.1 10*3/uL (ref 0.0–0.5)
HEMATOCRIT: 37.5 % (ref 34.8–46.6)
HGB: 12.5 g/dL (ref 11.6–15.9)
LYMPH#: 0.4 10*3/uL — AB (ref 0.9–3.3)
LYMPH%: 15.2 % (ref 14.0–49.7)
MCH: 33.3 pg (ref 25.1–34.0)
MCHC: 33.4 g/dL (ref 31.5–36.0)
MCV: 99.7 fL (ref 79.5–101.0)
MONO#: 0.2 10*3/uL (ref 0.1–0.9)
MONO%: 5.7 % (ref 0.0–14.0)
NEUT%: 74.5 % (ref 38.4–76.8)
NEUTROS ABS: 2 10*3/uL (ref 1.5–6.5)
PLATELETS: 145 10*3/uL (ref 145–400)
RBC: 3.76 10*6/uL (ref 3.70–5.45)
RDW: 14.1 % (ref 11.2–14.5)
WBC: 2.7 10*3/uL — AB (ref 3.9–10.3)

## 2015-12-14 MED ORDER — SODIUM CHLORIDE 0.9% FLUSH
10.0000 mL | INTRAVENOUS | Status: DC | PRN
  Administered 2015-12-14: 10 mL
  Filled 2015-12-14: qty 10

## 2015-12-14 MED ORDER — SODIUM CHLORIDE 0.9 % IV SOLN
Freq: Once | INTRAVENOUS | Status: AC
  Administered 2015-12-14: 10:00:00 via INTRAVENOUS

## 2015-12-14 MED ORDER — ACETAMINOPHEN 325 MG PO TABS
ORAL_TABLET | ORAL | Status: AC
  Filled 2015-12-14: qty 1

## 2015-12-14 MED ORDER — DIPHENHYDRAMINE HCL 25 MG PO CAPS
25.0000 mg | ORAL_CAPSULE | Freq: Once | ORAL | Status: AC
  Administered 2015-12-14: 25 mg via ORAL

## 2015-12-14 MED ORDER — ACETAMINOPHEN 325 MG PO TABS
325.0000 mg | ORAL_TABLET | Freq: Once | ORAL | Status: AC
  Administered 2015-12-14: 325 mg via ORAL

## 2015-12-14 MED ORDER — HEPARIN SOD (PORK) LOCK FLUSH 100 UNIT/ML IV SOLN
500.0000 [IU] | Freq: Once | INTRAVENOUS | Status: AC | PRN
  Administered 2015-12-14: 500 [IU]
  Filled 2015-12-14: qty 5

## 2015-12-14 MED ORDER — TRASTUZUMAB CHEMO 150 MG IV SOLR
6.0000 mg/kg | Freq: Once | INTRAVENOUS | Status: AC
  Administered 2015-12-14: 378 mg via INTRAVENOUS
  Filled 2015-12-14: qty 18

## 2015-12-14 MED ORDER — DIPHENHYDRAMINE HCL 25 MG PO CAPS
ORAL_CAPSULE | ORAL | Status: AC
  Filled 2015-12-14: qty 1

## 2015-12-14 NOTE — Patient Instructions (Signed)
[facility] Discharge Instructions for Patients Receiving Chemotherapy  Today you received the following chemotherapy agents:  Herceptin  To help prevent nausea and vomiting after your treatment, we encourage you to take your nausea medication as prescribed.   If you develop nausea and vomiting that is not controlled by your nausea medication, call the clinic.   BELOW ARE SYMPTOMS THAT SHOULD BE REPORTED IMMEDIATELY:  *FEVER GREATER THAN 100.5 F  *CHILLS WITH OR WITHOUT FEVER  NAUSEA AND VOMITING THAT IS NOT CONTROLLED WITH YOUR NAUSEA MEDICATION  *UNUSUAL SHORTNESS OF BREATH  *UNUSUAL BRUISING OR BLEEDING  TENDERNESS IN MOUTH AND THROAT WITH OR WITHOUT PRESENCE OF ULCERS  *URINARY PROBLEMS  *BOWEL PROBLEMS  UNUSUAL RASH Items with * indicate a potential emergency and should be followed up as soon as possible.  Feel free to call the clinic you have any questions or concerns. The clinic phone number is [phone].  Please show the CHEMO ALERT CARD at check-in to the Emergency Department and triage nurse.   

## 2015-12-14 NOTE — Progress Notes (Signed)
Patient Care Team: [name], MD as PCP - General (Internal Medicine) [name], MD as Consulting Physician (General Surgery) [name], MD as Consulting Physician (Hematology and Oncology)  DIAGNOSIS:  Encounter Diagnosis  Name Primary?  . Malignant neoplasm of lower-inner quadrant of right breast of female, estrogen receptor negative ([place])     SUMMARY OF ONCOLOGIC HISTORY:   Breast cancer of lower-inner quadrant of right female breast ([place])   12/02/2014 Mammogram    Right breast irregular mass 1 cm size with calcifications      12/09/2014 Initial Diagnosis    Right breast biopsy 3:30 position: Invasive ductal carcinoma grade 3, ER 0%, PR 0%, HER-2 negative ratio 1.5, Ki-67 90%, T1C N0 stage IA clinical stage      12/16/2014 Procedure    genetic testing showed NBN pathologic mutation      02/03/2015 Surgery    Right lumpectomy: Invasive ductal carcinoma grade 2, 2.1 cm, with associated DCIS, margins are negative, 0/1 lymph node, ER 0%, PR 0%, HER-2 positive ratio 2.31, T2 N0 stage II a      03/09/2015 - 06/22/2015 Chemotherapy    Adjuvant chemotherapy with [place] 6 followed by Herceptin maintenance for 1 year      08/11/2015 - 09/22/2015 Radiation Therapy    Adjuvant radiation therapy       CHIEF COMPLIANT: Follow-up on Herceptin therapy  INTERVAL HISTORY: Shawna [name] is a 75 year old with above-mentioned history of right breast cancer treated with lumpectomy followed by adjuvant chemotherapy with [place] 6 cycles but is on Herceptin maintenance. Patient will finish Herceptin maintenance by February 2018. She is tolerating Herceptin extremely well. She continues to have neuropathy of the feet which are getting slowly better over time. 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 She has scaling skin changes on her scalp. She does. She had these even prior to chemotherapy but they have gone away during chemotherapy now have reappeared. She has a dermatologist  appointment next month.  REVIEW OF SYSTEMS:   Constitutional: Denies fevers, chills or abnormal weight loss Eyes: Denies blurriness of vision Ears, nose, mouth, throat, and face: Denies mucositis or sore throat, eczematous changes in the scalp Respiratory: Denies cough, dyspnea or wheezes Cardiovascular: Denies palpitation, chest discomfort Gastrointestinal:  Denies nausea, heartburn or change in bowel habits Skin: Denies abnormal skin rashes Lymphatics: Denies new lymphadenopathy or easy bruising Neurological:Denies numbness, tingling or new weaknesses Behavioral/Psych: Mood is stable, no new changes  Extremities: No lower extremity edema Breast:  denies any pain or lumps or nodules in either breasts All other systems were reviewed with the patient and are negative.  I have reviewed the past medical history, past surgical history, social history and family history with the patient and they are unchanged from previous note.  ALLERGIES:  has No Known Allergies.  MEDICATIONS:  Current Outpatient Prescriptions  Medication Sig Dispense Refill  . ALPRAZolam (XANAX) 0.25 MG tablet Take 0.25-0.5 mg by mouth 2 (two) times daily as needed for anxiety.    . Calcium Carbonate-Vitamin D (CALCIUM 600 + D PO) Take 1 tablet by mouth daily.     . CRESTOR 20 MG tablet Take 1 tablet by mouth daily.  0  . gabapentin (NEURONTIN) 300 MG capsule Take 1 capsule (300 mg total) by mouth at bedtime. 30 capsule 6  . hydrochlorothiazide (MICROZIDE) 12.5 MG capsule Take 12.5 mg by mouth every other day.     . lidocaine-prilocaine (EMLA) cream Apply to affected area once 30 g 3  .  losartan (COZAAR) 100 MG tablet Take 100 mg by mouth daily.  0  . tolterodine (DETROL) 2 MG tablet Take 2 mg by mouth at bedtime.    . Vitamin D, Ergocalciferol, (DRISDOL) 50000 units CAPS capsule Take 50,000 Units by mouth 2 (two) times a week. Monday and Thursday    . zoledronic acid (RECLAST) 5 MG/100ML SOLN Inject 5 mg into the vein  once. annually      No current facility-administered medications for this visit.     PHYSICAL EXAMINATION: ECOG PERFORMANCE STATUS: 1 - Symptomatic but completely ambulatory  Vitals:   12/14/15 0928  BP: 134/77  Pulse: 79  Resp: 16  Temp: 97.6 F (36.4 C)   Filed Weights   12/14/15 0928  Weight: 134 lb 14.4 oz (61.2 kg)    GENERAL:alert, no distress and comfortable SKIN: skin color, texture, turgor are normal, no rashes or significant lesions EYES: normal, Conjunctiva are pink and non-injected, sclera clear OROPHARYNX:no exudate, no erythema and lips, buccal mucosa, and tongue normal  NECK: supple, thyroid normal size, non-tender, without nodularity LYMPH:  no palpable lymphadenopathy in the cervical, axillary or inguinal LUNGS: clear to auscultation and percussion with normal breathing effort HEART: regular rate & rhythm and no murmurs and no lower extremity edema ABDOMEN:abdomen soft, non-tender and normal bowel sounds MUSCULOSKELETAL:no cyanosis of digits and no clubbing  NEURO: Grade 2 peripheral neuropathy EXTREMITIES: No lower extremity edema  LABORATORY DATA:  I have reviewed the data as listed   Chemistry      Component Value Date/Time   NA 143 12/14/2015 0912   K 4.1 12/14/2015 0912   CL 108 01/29/2015 1545   CO2 25 12/14/2015 0912   BUN 25.7 12/14/2015 0912   CREATININE 0.9 12/14/2015 0912      Component Value Date/Time   CALCIUM 9.3 12/14/2015 0912   ALKPHOS 142 12/14/2015 0912   AST 55 (H) 12/14/2015 0912   ALT 76 (H) 12/14/2015 0912   BILITOT 0.66 12/14/2015 0912       Lab Results  Component Value Date   WBC 2.7 (L) 12/14/2015   HGB 12.5 12/14/2015   HCT 37.5 12/14/2015   MCV 99.7 12/14/2015   PLT 145 12/14/2015   NEUTROABS 2.0 12/14/2015    ASSESSMENT & PLAN:  Breast cancer of lower-inner quadrant of right female breast ([place]) Right lumpectomy 02/03/2015: Invasive ductal carcinoma grade 2, 2.1 cm, with associated DCIS, margins are  negative, 0/1 lymph node, ER 0%, PR 0%, HER-2 positiveratio 2.31, T2 N0 stage II a. 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 Adjuvant chemotherapy with TCH 6 cycles started 03/09/15 completed 06/22/15 now onHerceptin maintenance for 1 year  2. Followed by radiation therapy 08/11/2015 to09/05/2015 -------------------------------------------------------------------------------------------------------------------------------------------------------- Current Treatment: Herceptin maintenance ECHO 4/17: EF 65-70% (patient will need new echo) Monitoring closely for toxicities I reviewed her blood work.  Patient's primary care physician is helping her adjust her blood pressure medications and treat the leg swelling. 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 We will have to closely watch and monitor her neuropathy symptoms. I do not believe that Herceptin is causing her neuropathy.  Nail changes: Fever the nails are healing on the nails actually came off yesterday on her toes. These are related to prior chemotherapy. Scalp eczema: I encouraged  her to use Coconut oil. She will discuss with her dermatologist about what other options she has.  I discussed with the patient extensively regarding oral therapy with Neratinib. This treatment has been approved for HER-2 positive breast cancer to be taken after Herceptin maintenance is been completed.  RTC in 3 weeks for Herceptin maintenance in 6 weeks for follow-up    No orders of the defined types were placed in this encounter.  The patient has a good understanding of the overall plan. she agrees with it. she will call with any problems that may develop before the next visit  here.   Rulon Eisenmenger, MD 12/14/15

## 2015-12-14 NOTE — Assessment & Plan Note (Signed)
Right lumpectomy 02/03/2015: Invasive ductal carcinoma grade 2, 2.1 cm, with associated DCIS, margins are negative, 0/1 lymph node, ER 0%, PR 0%, HER-2 positiveratio 2.31, T2 N0 stage II a. 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 Adjuvant chemotherapy with TCH 6 cycles started 03/09/15 completed 06/22/15 now onHerceptin maintenance for 1 year  2. Followed by radiation therapy 08/11/2015 to09/05/2015 -------------------------------------------------------------------------------------------------------------------------------------------------------- Current Treatment: Herceptin maintenance ECHO 4/17: EF 65-70% (patient will need new echo) Monitoring closely for toxicities I reviewed her blood work.  Patient's primary care physician is helping her adjust her blood pressure medications and treat the leg swelling. 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 We will have to closely watch and monitor her neuropathy symptoms. I do not believe that Herceptin is causing her neuropathy.  Nail changes: Fever the nails are healing on the nails actually came off yesterday on her toes. These are related to prior chemotherapy.  RTC in 3 weeks for Herceptin maintenance in 6 weeks for follow-up

## 2015-12-14 NOTE — Progress Notes (Signed)
GYNECOLOGY  VISIT   HPI: 75 y.o.   Widowed  Caucasian  female   G2P2002 with Patient's last menstrual period was 01/17/1982 (approximate).   here for   Pessary insertion.  Stopped the Detrol while she was in [place] and she had less awareness of the prolapse. Did not feel it.  GYNECOLOGIC HISTORY: Patient's last menstrual period was 01/17/1982 (approximate). 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 Monoallelic mutation of NBN gene   . Genetic testing 01/13/2015  . Family history of breast cancer   . Family history of colon cancer   . Breast cancer of lower-inner quadrant of right female breast ([place]) 12/30/2014    Past Medical History:  Diagnosis Date  . Breast cancer ([place]) 11/2014  . Family history of breast cancer   . Family history of colon cancer   . History of radiation therapy 08/11/15- 09/23/2015   Right Breast  . Hyperlipidemia   . Hypertension   . Kidney stone on left side   . Monoallelic mutation of NBN gene   . Osteoporosis   . Skin cancer of nose     Past Surgical History:  Procedure Laterality Date  . APPENDECTOMY    . BREAST LUMPECTOMY WITH RADIOACTIVE SEED AND SENTINEL LYMPH NODE BIOPSY Right 02/03/2015   Procedure: RIGHT BREAST LUMPECTOMY WITH RADIOACTIVE SEED AND RIGHT SENTINEL LYMPH NODE BIOPSY;  Surgeon: [name], MD;  Location: [facility];  Service: General;  Laterality: Right;  . BREAST SURGERY  11/2012   biopsy, benign breast tissue  . COLONOSCOPY  06/2010   rec  . LITHOTRIPSY     kidney stone on left side  . OVARIAN CYST REMOVAL  age 70  . POLYPECTOMY  04/2005   colon  . PORTACATH PLACEMENT Left 02/03/2015    Procedure: INSERTION PORT-A-CATH WITH ULTRA SOUND GUIDANCE;  Surgeon: [name], MD;  Location: [facility];  Service: General;  Laterality: Left;    Current Outpatient Prescriptions  Medication Sig Dispense Refill  . ALPRAZolam (XANAX) 0.25 MG tablet Take 0.25-0.5 mg by mouth 2 (two) times daily as needed for anxiety.    . Calcium Carbonate-Vitamin D (CALCIUM 600 + D PO) Take 1 tablet by mouth daily.     . CRESTOR 20 MG tablet Take 1 tablet by mouth daily.  0  . gabapentin (NEURONTIN) 300 MG capsule Take 1 capsule (300 mg total) by mouth at bedtime. 30 capsule 6  . hydrochlorothiazide (MICROZIDE) 12.5 MG capsule Take 12.5 mg by mouth every other day.     . lidocaine-prilocaine (EMLA) cream Apply to affected area once 30 g 3  . losartan (COZAAR) 100 MG tablet Take 100 mg by mouth daily.  0  . tolterodine (DETROL) 2 MG tablet Take 2 mg by mouth at bedtime.    . Vitamin D, Ergocalciferol, (DRISDOL) 50000 units CAPS capsule Take 50,000 Units by mouth 2 (two) times a week. Monday and Thursday    . zoledronic acid (RECLAST) 5 MG/100ML SOLN Inject 5 mg into the vein once.  annually      No current facility-administered medications for this visit.      ALLERGIES: Patient has no known allergies.  Family History  Problem Relation Age of Onset  . Colon cancer Sister 44  . Breast cancer Mother 26  . Hypertension Mother   . Osteoporosis Mother   . Heart disease Father     CABG  . Dementia Father   . Throat cancer Brother 17  . Breast cancer Sister 3  . Lymphoma Sister 5  . Melanoma Sister   . Skin cancer Brother   . Skin cancer Daughter     Social History   Social History  . Marital status: Widowed    Spouse name: N/A  . Number of children: 2  . Years of education: N/A   Occupational History  . Not on file.   Social History Main Topics  . Smoking status: Never Smoker  . Smokeless tobacco: Never Used  . Alcohol use 4.2 oz/week    7 Glasses of wine per week      Comment: 6-7 glasses of wine a week  . Drug use: No  . Sexual activity: No   Other Topics Concern  . Not on file   Social History Narrative  . No narrative on file    ROS:  Pertinent items are noted in HPI.  PHYSICAL EXAMINATION:    BP 126/68 (BP Location: Right Arm, Patient Position: Sitting, Cuff Size: Normal)   Pulse 80   Resp 16   Ht 5\' 9"  (1.753 m)   Wt 134 lb 12.8 oz (61.1 kg)   LMP 01/17/1982 (Approximate)   BMI 19.91 kg/m     General appearance: alert, cooperative and appears stated age  Pelvic: External genitalia:  no lesions              Urethra:  normal appearing urethra with no masses, tenderness or lesions              Bartholins and Skenes: normal                 Vagina: normal appearing vagina with normal color and discharge, no lesions              Third degree cystocele, almost second degree uterine prolapse, and first degree rectocele.              Cervix: no lesions                Bimanual Exam:  Uterus:  normal size, contour, position, consistency, mobility, non-tender              Adnexa: no mass, fullness, tenderness            Pessary #3 ring with support placed, comfortable and maintains well with maneuvers in the room - Lot number [id number], [employer].   Chaperone was present for exam.  ASSESSMENT  Incomplete uterovaginal prolapse.  Pessary fitting completed.  PLAN  Patient declines learning to place and remove today.  Instructed in pessary care.  Return in one week for a recheck.  Call for vaginal bleeding, pain, heavy vaginal discharge, or any other concern.    An After Visit Summary was printed and given to the patient.  _15_____ minutes face to face time of which over 50% was spent in counseling.

## 2015-12-15 ENCOUNTER — Telehealth: Payer: Self-pay | Admitting: Obstetrics and Gynecology

## 2015-12-15 NOTE — Telephone Encounter (Signed)
Call back to patient. She has looked with mirror and is unable to see pessary. She sees balloon type bulge in vagina. No pain, just feels the vaginal pressure. States feels slightly "dry." Voiding without difficulty.  Advised  She may apply small amount of KY gel, olive oil or coconut oil to relieve dryness. Advised not to use any other scented products. Encouraged limited lifting or strenuous activity tonight. Discussed pressure and bulging may be worse with prolonged activity or time on her feet. Appointment scheduled for tomorrow morning and instructed to call back if any changes prior to appointment.  Advised Dr Sabra Heck will review call in Dr Elza Rafter absence and will call back if any additional instruction.   Routing to provider for final review. Patient agreeable to disposition. Will close encounter.   Routing to Dr Sabra Heck while Dr Quincy Simmonds off.   CC: Dr Quincy Simmonds, Juluis Rainier.

## 2015-12-15 NOTE — Telephone Encounter (Signed)
Patient had pessary inserted yesterday and she is having some issues.

## 2015-12-15 NOTE — Telephone Encounter (Signed)
Spoke with patient. Patient states she had pessary placed by Dr. Quincy Simmonds 12/14/15. Patient states around lunchtime today she noticed "an organ bulging out of vagina" Patient states the pessary has not fallen out and she is able to urinate. Advised patient Dr. Quincy Simmonds is out of the office, will review with covering provider and return call. Patient is agreeable.  Dr. Sabra Heck, please advise?  Cc: Dr. Quincy Simmonds

## 2015-12-16 ENCOUNTER — Encounter: Payer: Self-pay | Admitting: Obstetrics and Gynecology

## 2015-12-16 ENCOUNTER — Ambulatory Visit (INDEPENDENT_AMBULATORY_CARE_PROVIDER_SITE_OTHER): Payer: Medicare Other | Admitting: Obstetrics and Gynecology

## 2015-12-16 VITALS — BP 118/78 | HR 70 | Ht 69.0 in | Wt 134.0 lb

## 2015-12-16 DIAGNOSIS — Z4689 Encounter for fitting and adjustment of other specified devices: Secondary | ICD-10-CM

## 2015-12-16 NOTE — Progress Notes (Signed)
GYNECOLOGY  VISIT   HPI: 75 y.o.   Widowed  Caucasian  female   G2P2002 with Patient's last menstrual period was 01/17/1982 (approximate).   here for follow up after pessary insertion. Patient states "something is bulging out". She thinks pessary is still inside vagina. Patient is able to void without difficulty.  Able to maintain the pessary with BMs.  No discomfort but has an awareness that the bulging is there.  Received #3 ring with support pessary on 12/14/15.  Was comfortable and able to maintain maneuvers with it in place.   #4 ring with support was uncomfortable for the patient when we did the fitting.   GYNECOLOGIC HISTORY: Patient's last menstrual period was 01/17/1982 (approximate). 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 Monoallelic mutation of NBN gene   . Genetic testing 01/13/2015  . Family history of breast cancer   . Family history of colon cancer   . Breast cancer of lower-inner quadrant of right female breast ([place]) 12/30/2014    Past Medical History:  Diagnosis Date  . Breast cancer ([facility]) 11/2014  . Family history of breast cancer   . Family history of colon cancer   . History of radiation therapy 08/11/15- 09/23/2015   Right Breast  . Hyperlipidemia   . Hypertension   . Kidney stone on left side   . Monoallelic mutation of NBN gene   . Osteoporosis   . Skin cancer of nose     Past Surgical History:  Procedure Laterality Date  . APPENDECTOMY    . BREAST LUMPECTOMY WITH RADIOACTIVE SEED AND SENTINEL LYMPH NODE BIOPSY Right 02/03/2015   Procedure: RIGHT BREAST LUMPECTOMY WITH RADIOACTIVE SEED AND RIGHT SENTINEL LYMPH NODE BIOPSY;  Surgeon: [name], MD;  Location: [facility];  Service: General;  Laterality: Right;  . BREAST SURGERY  11/2012   biopsy, benign breast tissue  . COLONOSCOPY  06/2010   rec  . LITHOTRIPSY     kidney stone on left side  . OVARIAN CYST REMOVAL  age 25  . POLYPECTOMY  04/2005   colon  . PORTACATH PLACEMENT Left 02/03/2015   Procedure: INSERTION PORT-A-CATH WITH ULTRA SOUND GUIDANCE;  Surgeon: [name], MD;  Location: [facility];  Service: General;  Laterality: Left;    Current Outpatient Prescriptions  Medication Sig Dispense Refill  . ALPRAZolam (XANAX) 0.25 MG tablet Take 0.25-0.5 mg by mouth 2 (two) times daily as needed for anxiety.    . Calcium Carbonate-Vitamin D (CALCIUM 600 + D PO) Take 1 tablet by mouth daily.     . CRESTOR 20 MG tablet Take 1 tablet by mouth daily.  0  . gabapentin (NEURONTIN) 300 MG capsule Take 1 capsule (300 mg total) by mouth at bedtime. 30 capsule 6  . hydrochlorothiazide (MICROZIDE) 12.5 MG capsule Take 12.5 mg by mouth every other day.     . lidocaine-prilocaine (EMLA) cream Apply to affected area once 30 g 3  . losartan (COZAAR) 100 MG tablet Take 100 mg by mouth daily.  0  . tolterodine (DETROL) 2 MG tablet Take 2 mg by mouth at bedtime.    . Vitamin D, Ergocalciferol, (DRISDOL) 50000 units CAPS capsule Take 50,000 Units by mouth 2 (two) times a week. Monday and Thursday    . zoledronic acid (RECLAST) 5 MG/100ML SOLN Inject 5 mg into the vein once. annually      No current facility-administered medications for this visit.      ALLERGIES: Patient has no known allergies.  Family History  Problem Relation Age of Onset  . Colon cancer Sister 53  . Breast cancer Mother 11  . Hypertension Mother   . Osteoporosis Mother   . Heart disease Father     CABG  . Dementia Father   . Throat cancer Brother 31  . Breast cancer Sister 50  . Lymphoma Sister 81  . Melanoma Sister   . Skin cancer Brother   . Skin cancer Daughter      Social History   Social History  . Marital status: Widowed    Spouse name: N/A  . Number of children: 2  . Years of education: N/A   Occupational History  . Not on file.   Social History Main Topics  . Smoking status: Never Smoker  . Smokeless tobacco: Never Used  . Alcohol use 4.2 oz/week    7 Glasses of wine per week     Comment: 6-7 glasses of wine a week  . Drug use: No  . Sexual activity: No   Other Topics Concern  . Not on file   Social History Narrative  . No narrative on file    ROS:  Pertinent items are noted in HPI.  PHYSICAL EXAMINATION:    BP 118/78 (BP Location: Left Arm, Patient Position: Sitting, Cuff Size: Normal)   Pulse 70   Ht 5\' 9"  (1.753 m)   Wt 134 lb (60.8 kg)   LMP 01/17/1982 (Approximate)   BMI 19.79 kg/m     General appearance: alert, cooperative and appears stated age  Pelvic: External genitalia:  no lesions              Urethra:  normal appearing urethra with no masses, tenderness or lesions              Bartholins and Skenes: normal                 Vagina: normal appearing vagina with normal color and discharge, no lesions.  Very minor erythema at the vaginal cuff.               Cervix: no lesions                Bimanual Exam:  Uterus:  normal size, contour, position, consistency, mobility, non-tender.  Pessary in place.  With Valsalva, the bladder does prolapse slightly in front of the pessary.              Adnexa: no mass, fullness, tenderness    Pessary removed, cleansed, and replaced.   Chaperone was present for exam.  ASSESSMENT  Incomplete uterovaginal prolapse.  Some prolapse in front of the pessary.  Hx breast cancer.   PLAN  Discussion of pessaries and how they are not a perfect science with respect to fit and size.  With straining and activity, the vagina and prolapse will normally drop down some.  I really want patient to be happy with her vaginal support.  Options of continued use of current pessary, new  right  with support of larger size, or Gelhorn discussed.  She will continue with the pessary for another week and then re-evaluate in one week.  She hopes to avoid surgery.   An After Visit Summary was printed and given to the patient.  _15_____ minutes face to face time of which over 50% was spent in counseling.

## 2015-12-24 ENCOUNTER — Encounter: Payer: Self-pay | Admitting: Obstetrics and Gynecology

## 2015-12-24 ENCOUNTER — Ambulatory Visit (INDEPENDENT_AMBULATORY_CARE_PROVIDER_SITE_OTHER): Payer: Medicare Other | Admitting: Obstetrics and Gynecology

## 2015-12-24 VITALS — BP 126/74 | HR 70 | Ht 69.0 in | Wt 132.0 lb

## 2015-12-24 DIAGNOSIS — N812 Incomplete uterovaginal prolapse: Secondary | ICD-10-CM | POA: Diagnosis not present

## 2015-12-24 NOTE — Progress Notes (Signed)
GYNECOLOGY  VISIT   HPI: 75 y.o.   Widowed  Caucasian  female   G2P2002 with Patient's last menstrual period was 01/17/1982 (approximate).   here for pessary check.    Has #3 ring with support pessary.   Doing well overall.  Feels a pressure before voiding.  Able to void and have BMs.  No pain.  No bleeding.  Pessary has not fallen out.  Satisfied overall.  Does not want to change to a different pessary.  GYNECOLOGIC HISTORY: Patient's last menstrual period was 01/17/1982 (approximate). 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 Monoallelic mutation of NBN gene   . Genetic testing 01/13/2015  . Family history of breast cancer   . Family history of colon cancer   . Breast cancer of lower-inner quadrant of right female breast ([facility]) 12/30/2014    Past Medical History:  Diagnosis Date  . Breast cancer ([facility]) 11/2014  . Family history of breast cancer   . Family history of colon cancer   . History of radiation therapy 08/11/15- 09/23/2015   Right Breast  . Hyperlipidemia   . Hypertension   . Kidney stone on left side   . Monoallelic mutation of NBN gene   . Osteoporosis   . Skin cancer of nose     Past Surgical History:  Procedure Laterality Date  . APPENDECTOMY    . BREAST LUMPECTOMY WITH RADIOACTIVE SEED AND SENTINEL LYMPH NODE BIOPSY Right 02/03/2015   Procedure: RIGHT BREAST LUMPECTOMY WITH RADIOACTIVE SEED AND RIGHT SENTINEL LYMPH NODE BIOPSY;  Surgeon: [name], MD;  Location: [facility];  Service: General;  Laterality: Right;  . BREAST SURGERY  11/2012   biopsy, benign breast tissue  . COLONOSCOPY  06/2010   rec  . LITHOTRIPSY     kidney  stone on left side  . OVARIAN CYST REMOVAL  age 42  . POLYPECTOMY  04/2005   colon  . PORTACATH PLACEMENT Left 02/03/2015   Procedure: INSERTION PORT-A-CATH WITH ULTRA SOUND GUIDANCE;  Surgeon: [name], MD;  Location: [facility];  Service: General;  Laterality: Left;    Current Outpatient Prescriptions  Medication Sig Dispense Refill  . ALPRAZolam (XANAX) 0.25 MG tablet Take 0.25-0.5 mg by mouth 2 (two) times daily as needed for anxiety.    . Calcium Carbonate-Vitamin D (CALCIUM 600 + D PO) Take 1 tablet by mouth daily.     . CRESTOR 20 MG tablet Take 1 tablet by mouth daily.  0  . gabapentin (NEURONTIN) 300 MG capsule Take 1 capsule (300 mg total) by mouth at bedtime. 30 capsule 6  . hydrochlorothiazide (MICROZIDE) 12.5 MG capsule Take 12.5 mg by mouth every other day.     . lidocaine-prilocaine (EMLA) cream Apply to affected area once 30 g 3  . losartan (COZAAR) 100 MG tablet Take 100 mg by mouth daily.  0  . Vitamin D, Ergocalciferol, (DRISDOL) 50000 units CAPS capsule Take 50,000 Units by mouth 2 (two) times a week. Monday and Thursday    . zoledronic acid (RECLAST)  5 MG/100ML SOLN Inject 5 mg into the vein once. annually      No current facility-administered medications for this visit.      ALLERGIES: Patient has no known allergies.  Family History  Problem Relation Age of Onset  . Colon cancer Sister 79  . Breast cancer Mother 53  . Hypertension Mother   . Osteoporosis Mother   . Heart disease Father     CABG  . Dementia Father   . Throat cancer Brother 48  . Breast cancer Sister 43  . Lymphoma Sister 33  . Melanoma Sister   . Skin cancer Brother   . Skin cancer Daughter     Social History   Social History  . Marital status: Widowed    Spouse name: N/A  . Number of children: 2  . Years of education: N/A   Occupational History  . Not on file.   Social History Main Topics  . Smoking status: Never Smoker  . Smokeless tobacco: Never Used  .  Alcohol use 4.2 oz/week    7 Glasses of wine per week     Comment: 6-7 glasses of wine a week  . Drug use: No  . Sexual activity: No   Other Topics Concern  . Not on file   Social History Narrative  . No narrative on file    ROS:  Pertinent items are noted in HPI.  PHYSICAL EXAMINATION:    BP 126/74 (BP Location: Left Arm, Patient Position: Sitting, Cuff Size: Normal)   Pulse 70 Comment: irregular  Ht 5\' 9"  (1.753 m)   Wt 132 lb (59.9 kg)   LMP 01/17/1982 (Approximate)   BMI 19.49 kg/m     General appearance: alert, cooperative and appears stated age    Pelvic: External genitalia:  no lesions              Urethra:  normal appearing urethra with no masses, tenderness or lesions              Bartholins and Skenes: normal                 Vagina: normal appearing vagina with normal color and discharge, no lesions              Cervix: no lesions                Bimanual Exam:  Uterus:  normal size, contour, position, consistency, mobility, non-tender              Adnexa: no mass, fullness, tenderness     Pessary holding prolapse adequately.  Pessary removed and cleansed and replaced.  Patient was able to remove pessary then on her own.  Patient subsequently dropped her pessary on the floor with teaching her how to place it back again.        New Premier pessary #3 ring with support to patient. Lot number;  [id number].  Chaperone was present for exam.  ASSESSMENT  Incomplete uterovaginal prolapse. Doing well with pessary overall.   PLAN  Patients old pessary was sent to be sterilized and will be retained by the office as she has a new one.  She is taking the new pessary home with her and will place in on her own there.  Call for any problems.  Return for routine annual exam 02/26/16 with Edman Circle and prn.   An After Visit Summary was printed and given to the patient.  _15_____ minutes face to face time  of which over 50% was spent in counseling.

## 2015-12-29 DIAGNOSIS — R922 Inconclusive mammogram: Secondary | ICD-10-CM | POA: Diagnosis not present

## 2015-12-31 ENCOUNTER — Encounter: Payer: Self-pay | Admitting: Nurse Practitioner

## 2016-01-04 ENCOUNTER — Ambulatory Visit (HOSPITAL_BASED_OUTPATIENT_CLINIC_OR_DEPARTMENT_OTHER): Payer: Medicare Other

## 2016-01-04 VITALS — BP 148/84 | HR 67 | Temp 99.0°F | Resp 17

## 2016-01-04 DIAGNOSIS — Z5112 Encounter for antineoplastic immunotherapy: Secondary | ICD-10-CM | POA: Diagnosis not present

## 2016-01-04 DIAGNOSIS — C50311 Malignant neoplasm of lower-inner quadrant of right female breast: Secondary | ICD-10-CM | POA: Diagnosis not present

## 2016-01-04 MED ORDER — TRASTUZUMAB CHEMO 150 MG IV SOLR
6.0000 mg/kg | Freq: Once | INTRAVENOUS | Status: AC
  Administered 2016-01-04: 378 mg via INTRAVENOUS
  Filled 2016-01-04: qty 18

## 2016-01-04 MED ORDER — ACETAMINOPHEN 325 MG PO TABS
ORAL_TABLET | ORAL | Status: AC
  Filled 2016-01-04: qty 2

## 2016-01-04 MED ORDER — SODIUM CHLORIDE 0.9% FLUSH
10.0000 mL | INTRAVENOUS | Status: DC | PRN
  Administered 2016-01-04: 10 mL
  Filled 2016-01-04: qty 10

## 2016-01-04 MED ORDER — DIPHENHYDRAMINE HCL 25 MG PO CAPS
ORAL_CAPSULE | ORAL | Status: AC
  Filled 2016-01-04: qty 2

## 2016-01-04 MED ORDER — ACETAMINOPHEN 325 MG PO TABS
325.0000 mg | ORAL_TABLET | Freq: Once | ORAL | Status: AC
  Administered 2016-01-04: 325 mg via ORAL

## 2016-01-04 MED ORDER — SODIUM CHLORIDE 0.9 % IV SOLN
Freq: Once | INTRAVENOUS | Status: AC
  Administered 2016-01-04: 10:00:00 via INTRAVENOUS

## 2016-01-04 MED ORDER — HEPARIN SOD (PORK) LOCK FLUSH 100 UNIT/ML IV SOLN
500.0000 [IU] | Freq: Once | INTRAVENOUS | Status: AC | PRN
  Administered 2016-01-04: 500 [IU]
  Filled 2016-01-04: qty 5

## 2016-01-04 MED ORDER — DIPHENHYDRAMINE HCL 25 MG PO CAPS
25.0000 mg | ORAL_CAPSULE | Freq: Once | ORAL | Status: AC
  Administered 2016-01-04: 25 mg via ORAL

## 2016-01-04 NOTE — Patient Instructions (Signed)
[facility] Discharge Instructions for Patients Receiving Chemotherapy  Today you received the following chemotherapy agents:  Herceptin  To help prevent nausea and vomiting after your treatment, we encourage you to take your nausea medication as prescribed.   If you develop nausea and vomiting that is not controlled by your nausea medication, call the clinic.   BELOW ARE SYMPTOMS THAT SHOULD BE REPORTED IMMEDIATELY:  *FEVER GREATER THAN 100.5 F  *CHILLS WITH OR WITHOUT FEVER  NAUSEA AND VOMITING THAT IS NOT CONTROLLED WITH YOUR NAUSEA MEDICATION  *UNUSUAL SHORTNESS OF BREATH  *UNUSUAL BRUISING OR BLEEDING  TENDERNESS IN MOUTH AND THROAT WITH OR WITHOUT PRESENCE OF ULCERS  *URINARY PROBLEMS  *BOWEL PROBLEMS  UNUSUAL RASH Items with * indicate a potential emergency and should be followed up as soon as possible.  Feel free to call the clinic you have any questions or concerns. The clinic phone number is [phone].  Please show the CHEMO ALERT CARD at check-in to the Emergency Department and triage nurse.   

## 2016-01-05 ENCOUNTER — Telehealth: Payer: Self-pay | Admitting: Obstetrics and Gynecology

## 2016-01-05 NOTE — Telephone Encounter (Signed)
Spoke with patient. Reviewed with nursing supervisor. Appointment scheduled for tomorrow at 3:45 pm with Dr.Silva. Advised patient I will review with Dr.Silva tomorrow morning and return call if she has any additional recommendations or if appointment needs to be moved forward. Patient is agreeable.

## 2016-01-05 NOTE — Telephone Encounter (Signed)
Spoke with patient. Patient was seen in the office on 12/24/2015 for follow up with pessary. Reports she tried to place her pessary for many days without success following appointment. Successfully placed pessary last week. Now feels a "bulge" in her vagina. On 01/03/2016 she began having burning and discomfort with urination. Denies any lower back pain, fever, or chills. Took temperature today which was 96.5 and BP was 136/80. Patient has not tried removing her pessary. Would like to see Dr.Silva for assessment to check placement and have pessary removed if needed. Requests to see Dr.Silva only. Advised I will review with nursing supervisor and return call regarding scheduling. Patient is agreeable.

## 2016-01-05 NOTE — Telephone Encounter (Signed)
Please have patient come in earlier if possible.  How about 1:15 tomorrow?

## 2016-01-05 NOTE — Telephone Encounter (Signed)
Patient is having bladder issues and thinks it may be because of her pessary.

## 2016-01-06 ENCOUNTER — Encounter: Payer: Self-pay | Admitting: Obstetrics and Gynecology

## 2016-01-06 ENCOUNTER — Ambulatory Visit (INDEPENDENT_AMBULATORY_CARE_PROVIDER_SITE_OTHER): Payer: Medicare Other | Admitting: Obstetrics and Gynecology

## 2016-01-06 ENCOUNTER — Ambulatory Visit: Payer: Medicare Other | Admitting: Obstetrics and Gynecology

## 2016-01-06 VITALS — BP 134/80 | HR 62 | Resp 12 | Ht 69.0 in | Wt 132.6 lb

## 2016-01-06 DIAGNOSIS — N812 Incomplete uterovaginal prolapse: Secondary | ICD-10-CM

## 2016-01-06 DIAGNOSIS — R3915 Urgency of urination: Secondary | ICD-10-CM

## 2016-01-06 LAB — POCT URINALYSIS DIPSTICK
BILIRUBIN UA: NEGATIVE
KETONES UA: NEGATIVE
Nitrite, UA: NEGATIVE
Urobilinogen, UA: NEGATIVE
pH, UA: 5

## 2016-01-06 MED ORDER — SULFAMETHOXAZOLE-TRIMETHOPRIM 800-160 MG PO TABS: 1.0000 | ORAL_TABLET | Freq: Two times a day (BID) | ORAL | 0 refills | Status: DC

## 2016-01-06 NOTE — Telephone Encounter (Signed)
Spoke with patient. Patient is agreeable to moving her appointment up. Appointment moved to today at 1:15 pm with Dr.Silva.  Routing to provider for final review. Patient agreeable to disposition. Will close encounter.

## 2016-01-06 NOTE — Patient Instructions (Signed)
You may try AZO standard 1 tablet by mouth three times a day for bladder and urethral pain with a urinary tract infection.  This will not treat the infection, but will treat the pain.

## 2016-01-06 NOTE — Progress Notes (Signed)
GYNECOLOGY  VISIT   HPI: 75 y.o.   Widowed  Caucasian  female   G2P2002 with Patient's last menstrual period was 01/17/1982 (approximate).   here for pessary check and urinary burning and urgency.   Had Herceptin yesterday at [facility].  Urine dip - 3+ blood, 3+ WBCs, 2+ protein, trace glucose, pH 5.0.  Had a donut this am.  GYNECOLOGIC HISTORY: Patient's last menstrual period was 01/17/1982 (approximate).          OB History    Gravida Para Term Preterm AB Living   2 2 2  0 0 2   SAB TAB Ectopic Multiple Live Births   0 0 0 0 2         Patient Active Problem List   Diagnosis Date Noted  . Monoallelic mutation of NBN gene   . Genetic testing 01/13/2015  . Family history of breast cancer   . Family history of colon cancer   . 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 Breast cancer ([place]) 11/2014  . Family history of breast cancer   . Family history of colon cancer   . History of radiation therapy 08/11/15- 09/23/2015   Right Breast  . Hyperlipidemia   . Hypertension   . Kidney stone on left side   . Monoallelic mutation of NBN gene   . Osteoporosis   . Skin cancer of nose     Past Surgical History:  Procedure Laterality Date  . APPENDECTOMY    . BREAST LUMPECTOMY WITH RADIOACTIVE SEED AND SENTINEL LYMPH NODE BIOPSY Right 02/03/2015   Procedure: RIGHT BREAST LUMPECTOMY WITH RADIOACTIVE SEED AND RIGHT SENTINEL LYMPH NODE BIOPSY;  Surgeon: [name], MD;  Location: [place];  Service: General;  Laterality: Right;  . BREAST SURGERY  11/2012   biopsy, benign breast tissue  . COLONOSCOPY  06/2010   rec  . LITHOTRIPSY     kidney stone on left side  . OVARIAN CYST REMOVAL  age 61  . POLYPECTOMY  04/2005   colon  . PORTACATH PLACEMENT Left 02/03/2015   Procedure: INSERTION PORT-A-CATH WITH ULTRA SOUND GUIDANCE;  Surgeon: [name], MD;  Location: [place];  Service: General;  Laterality: Left;    Current Outpatient Prescriptions  Medication Sig Dispense Refill  . ALPRAZolam (XANAX) 0.25 MG tablet Take 0.25-0.5 mg by mouth 2 (two) times daily as needed for anxiety.    . Calcium Carbonate-Vitamin D (CALCIUM 600 + D PO) Take 1 tablet by mouth daily.     . CRESTOR 20 MG tablet Take 1 tablet by mouth daily.  0  . gabapentin (NEURONTIN) 300 MG capsule Take 1 capsule (300 mg total) by mouth at bedtime. 30 capsule 6  . hydrochlorothiazide (MICROZIDE) 12.5 MG capsule Take 12.5 mg by mouth every other day.     . lidocaine-prilocaine (EMLA) cream Apply to affected area once 30 g 3  . losartan (COZAAR) 100 MG tablet Take 100 mg by mouth daily.  0  . sulfamethoxazole-trimethoprim (BACTRIM DS) 800-160 MG tablet Take 1 tablet by mouth 2 (two) times daily. Take for one week. 14 tablet 0  . Vitamin D, Ergocalciferol, (DRISDOL) 50000 units CAPS capsule Take 50,000 Units by mouth 2 (two) times a week. Monday and Thursday    . zoledronic acid (RECLAST) 5 MG/100ML SOLN Inject 5 mg into the vein once. annually      No  current facility-administered medications for this visit.      ALLERGIES: Patient has no known allergies.  Family History  Problem Relation Age of Onset  . Colon cancer Sister 4  . Breast cancer Mother 7  . Hypertension Mother   . Osteoporosis Mother   . Heart disease Father     CABG  . Dementia Father   . Throat cancer Brother 68  . Breast cancer Sister 68  . Lymphoma Sister 64  . Melanoma Sister   . Skin cancer Brother   . Skin cancer Daughter     Social History   Social History  . Marital status: Widowed    Spouse name: N/A  . Number of children: 2  . Years of education: N/A   Occupational History  . Not on file.   Social History Main Topics  . Smoking status: Never Smoker  . Smokeless tobacco: Never Used  . Alcohol use 4.2 oz/week    7 Glasses of wine per week     Comment: 6-7 glasses of wine a week  . Drug  use: No  . Sexual activity: No   Other Topics Concern  . Not on file   Social History Narrative  . No narrative on file    ROS:  Pertinent items are noted in HPI.  PHYSICAL EXAMINATION:    BP 134/80 (BP Location: Left Arm)   Pulse 62   Resp 12   Ht 5\' 9"  (1.753 m)   Wt 132 lb 9.6 oz (60.1 kg)   LMP 01/17/1982 (Approximate)   BMI 19.58 kg/m     General appearance: alert, cooperative and appears stated age    Pelvic: External genitalia:  no lesions              Urethra:  normal appearing urethra with no masses, tenderness or lesions              Bartholins and Skenes: normal                 Vagina: normal appearing vagina with normal color and discharge, no lesions              Cervix: no lesions                Bimanual Exam:  Uterus:  normal size, contour, position, consistency, mobility, non-tender              Adnexa: no mass, fullness, tenderness        Pessary removed, cleansed, and replaced.  Chaperone was present for exam.  ASSESSMENT  Incomplete uterovaginal prolapse. Urinary urgency.  UTI.  PLAN  Urine micro and culture. Bactrim DS po bid for one week. Discussed AZO standard 100 mg po tid prn for the next 2 days.  Call for fever, back pain, nausea and vomiting, or blood in the urine. Hydrate with water. Follow up prn.    An After Visit Summary was printed and given to the patient.  _15_____ minutes face to face time of which over 50% was spent in counseling.

## 2016-01-07 LAB — URINALYSIS, MICROSCOPIC ONLY
CASTS: NONE SEEN [LPF]
CRYSTALS: NONE SEEN [HPF]
Yeast: NONE SEEN [HPF]

## 2016-01-08 LAB — URINE CULTURE: Colony Count: >=100000

## 2016-01-24 NOTE — Assessment & Plan Note (Signed)
Right lumpectomy 02/03/2015: Invasive ductal carcinoma grade 2, 2.1 cm, with associated DCIS, margins are negative, 0/1 lymph node, ER 0%, PR 0%, HER-2 positiveratio 2.31, T2 N0 stage II a. 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 Adjuvant chemotherapy with TCH 6 cycles started 03/09/15 completed 06/22/15 now onHerceptin maintenance for 1 year  2. Followed by radiation therapy 08/11/2015 to09/05/2015 -------------------------------------------------------------------------------------------------------------------------------------------------------- Current Treatment: Herceptin maintenance ECHO 4/17: EF 65-70% (patient will need new echo) Monitoring closely for toxicities I reviewed her blood work.  Hypertension: 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 Monitoring  Nail changes:  Scalp eczema:Getting better  I discussed with the patient extensively regarding oral therapy with Neratinib. This treatment has been approved for HER-2 positive breast cancer to be taken after Herceptin maintenance is been completed.  RTC in 3 weeks for Herceptin maintenance in 6 weeks for follow-up

## 2016-01-25 ENCOUNTER — Other Ambulatory Visit (HOSPITAL_BASED_OUTPATIENT_CLINIC_OR_DEPARTMENT_OTHER): Payer: Medicare Other

## 2016-01-25 ENCOUNTER — Ambulatory Visit (HOSPITAL_BASED_OUTPATIENT_CLINIC_OR_DEPARTMENT_OTHER): Payer: Medicare Other

## 2016-01-25 ENCOUNTER — Encounter: Payer: Self-pay | Admitting: Hematology and Oncology

## 2016-01-25 ENCOUNTER — Ambulatory Visit (HOSPITAL_BASED_OUTPATIENT_CLINIC_OR_DEPARTMENT_OTHER): Payer: Medicare Other | Admitting: Hematology and Oncology

## 2016-01-25 DIAGNOSIS — C50311 Malignant neoplasm of lower-inner quadrant of right female breast: Secondary | ICD-10-CM

## 2016-01-25 DIAGNOSIS — G629 Polyneuropathy, unspecified: Secondary | ICD-10-CM | POA: Diagnosis not present

## 2016-01-25 DIAGNOSIS — I10 Essential (primary) hypertension: Secondary | ICD-10-CM | POA: Diagnosis not present

## 2016-01-25 DIAGNOSIS — Z171 Estrogen receptor negative status [ER-]: Secondary | ICD-10-CM | POA: Diagnosis not present

## 2016-01-25 DIAGNOSIS — Z5112 Encounter for antineoplastic immunotherapy: Secondary | ICD-10-CM | POA: Diagnosis not present

## 2016-01-25 LAB — CBC WITH DIFFERENTIAL/PLATELET
BASO%: 0.8 % (ref 0.0–2.0)
BASOS ABS: 0 10*3/uL (ref 0.0–0.1)
EOS%: 1.6 % (ref 0.0–7.0)
Eosinophils Absolute: 0.1 10*3/uL (ref 0.0–0.5)
HEMATOCRIT: 37.3 % (ref 34.8–46.6)
HGB: 12.6 g/dL (ref 11.6–15.9)
LYMPH#: 0.6 10*3/uL — AB (ref 0.9–3.3)
LYMPH%: 16.4 % (ref 14.0–49.7)
MCH: 33.3 pg (ref 25.1–34.0)
MCHC: 33.8 g/dL (ref 31.5–36.0)
MCV: 98.7 fL (ref 79.5–101.0)
MONO#: 0.2 10*3/uL (ref 0.1–0.9)
MONO%: 5.2 % (ref 0.0–14.0)
NEUT#: 2.8 10*3/uL (ref 1.5–6.5)
NEUT%: 76 % (ref 38.4–76.8)
PLATELETS: 155 10*3/uL (ref 145–400)
RBC: 3.78 10*6/uL (ref 3.70–5.45)
RDW: 13.4 % (ref 11.2–14.5)
WBC: 3.7 10*3/uL — ABNORMAL LOW (ref 3.9–10.3)

## 2016-01-25 LAB — COMPREHENSIVE METABOLIC PANEL
ALT: 33 U/L (ref 0–55)
ANION GAP: 9 meq/L (ref 3–11)
AST: 33 U/L (ref 5–34)
Albumin: 3.9 g/dL (ref 3.5–5.0)
Alkaline Phosphatase: 113 U/L (ref 40–150)
BUN: 30.7 mg/dL — ABNORMAL HIGH (ref 7.0–26.0)
CALCIUM: 9.8 mg/dL (ref 8.4–10.4)
CHLORIDE: 106 meq/L (ref 98–109)
CO2: 24 mEq/L (ref 22–29)
CREATININE: 1 mg/dL (ref 0.6–1.1)
EGFR: 53 mL/min/{1.73_m2} — AB (ref >90)
Glucose: 79 mg/dl (ref 70–140)
POTASSIUM: 3.8 meq/L (ref 3.5–5.1)
Sodium: 140 mEq/L (ref 136–145)
Total Bilirubin: 0.69 mg/dL (ref 0.20–1.20)
Total Protein: 6.5 g/dL (ref 6.4–8.3)

## 2016-01-25 MED ORDER — SODIUM CHLORIDE 0.9% FLUSH
10.0000 mL | INTRAVENOUS | Status: DC | PRN
  Administered 2016-01-25: 10 mL
  Filled 2016-01-25: qty 10

## 2016-01-25 MED ORDER — ACETAMINOPHEN 325 MG PO TABS
ORAL_TABLET | ORAL | Status: AC
  Filled 2016-01-25: qty 1

## 2016-01-25 MED ORDER — ACETAMINOPHEN 325 MG PO TABS
325.0000 mg | ORAL_TABLET | Freq: Once | ORAL | Status: AC
  Administered 2016-01-25: 325 mg via ORAL

## 2016-01-25 MED ORDER — SODIUM CHLORIDE 0.9 % IV SOLN
6.0000 mg/kg | Freq: Once | INTRAVENOUS | Status: AC
  Administered 2016-01-25: 378 mg via INTRAVENOUS
  Filled 2016-01-25: qty 18

## 2016-01-25 MED ORDER — SODIUM CHLORIDE 0.9 % IV SOLN
Freq: Once | INTRAVENOUS | Status: AC
  Administered 2016-01-25: 11:00:00 via INTRAVENOUS

## 2016-01-25 MED ORDER — DIPHENHYDRAMINE HCL 25 MG PO CAPS
25.0000 mg | ORAL_CAPSULE | Freq: Once | ORAL | Status: AC
  Administered 2016-01-25: 25 mg via ORAL

## 2016-01-25 MED ORDER — DIPHENHYDRAMINE HCL 25 MG PO CAPS
ORAL_CAPSULE | ORAL | Status: AC
  Filled 2016-01-25: qty 1

## 2016-01-25 MED ORDER — HEPARIN SOD (PORK) LOCK FLUSH 100 UNIT/ML IV SOLN
500.0000 [IU] | Freq: Once | INTRAVENOUS | Status: AC | PRN
  Administered 2016-01-25: 500 [IU]
  Filled 2016-01-25: qty 5

## 2016-01-25 NOTE — Patient Instructions (Signed)
[facility] Discharge Instructions for Patients Receiving Chemotherapy  Today you received the following chemotherapy agents:  Herceptin  To help prevent nausea and vomiting after your treatment, we encourage you to take your nausea medication as prescribed.   If you develop nausea and vomiting that is not controlled by your nausea medication, call the clinic.   BELOW ARE SYMPTOMS THAT SHOULD BE REPORTED IMMEDIATELY:  *FEVER GREATER THAN 100.5 F  *CHILLS WITH OR WITHOUT FEVER  NAUSEA AND VOMITING THAT IS NOT CONTROLLED WITH YOUR NAUSEA MEDICATION  *UNUSUAL SHORTNESS OF BREATH  *UNUSUAL BRUISING OR BLEEDING  TENDERNESS IN MOUTH AND THROAT WITH OR WITHOUT PRESENCE OF ULCERS  *URINARY PROBLEMS  *BOWEL PROBLEMS  UNUSUAL RASH Items with * indicate a potential emergency and should be followed up as soon as possible.  Feel free to call the clinic you have any questions or concerns. The clinic phone number is [phone].  Please show the CHEMO ALERT CARD at check-in to the Emergency Department and triage nurse.   

## 2016-01-25 NOTE — Progress Notes (Signed)
Patient Care Team: [name], MD as PCP - General (Internal Medicine) [name], MD as Consulting Physician (General Surgery) [name], MD as Consulting Physician (Hematology and Oncology)  DIAGNOSIS:  Encounter Diagnosis  Name Primary?  . Malignant neoplasm of lower-inner quadrant of right breast of female, estrogen receptor negative ([place])     SUMMARY OF ONCOLOGIC HISTORY:   Breast cancer of lower-inner quadrant of right female breast ([place])   12/02/2014 Mammogram    Right breast irregular mass 1 cm size with calcifications      12/09/2014 Initial Diagnosis    Right breast biopsy 3:30 position: Invasive ductal carcinoma grade 3, ER 0%, PR 0%, HER-2 negative ratio 1.5, Ki-67 90%, T1C N0 stage IA clinical stage      12/16/2014 Procedure    genetic testing showed NBN pathologic mutation      02/03/2015 Surgery    Right lumpectomy: Invasive ductal carcinoma grade 2, 2.1 cm, with associated DCIS, margins are negative, 0/1 lymph node, ER 0%, PR 0%, HER-2 positive ratio 2.31, T2 N0 stage II a      03/09/2015 - 06/22/2015 Chemotherapy    Adjuvant chemotherapy with [place] 6 followed by Herceptin maintenance for 1 year      08/11/2015 - 09/22/2015 Radiation Therapy    Adjuvant radiation therapy       CHIEF COMPLIANT: Follow-up on Herceptin  INTERVAL HISTORY: Shawna [name] is a 76 year old with above-mentioned history of right breast cancer treated with lumpectomy and finished adjuvant chemotherapy and she will finish Herceptin in 3 weeks time. She has tolerated Herceptin reasonably well. Her biggest issue has been neuropathy in the feet. It has not been improving. She has been taking gabapentin 300 mg at bedtime. She reports that it has not made any difference. She would like to discontinue it. She finishes Herceptin in 3 weeks. She will then subsequently get her port removed. She also has alopecia several bald spots. She is looking forward to May 2018 when her daughter is  going to be married at [place]:   Constitutional: Denies fevers, chills or abnormal weight loss Eyes: Denies blurriness of vision Ears, nose, mouth, throat, and face: Denies mucositis or sore throat Respiratory: Denies cough, dyspnea or wheezes Cardiovascular: Denies palpitation, chest discomfort Gastrointestinal:  Denies nausea, heartburn or change in bowel habits Skin: Denies abnormal skin rashes Lymphatics: Denies new lymphadenopathy or easy bruising Neurological: Peripheral sensory neuropathy Behavioral/Psych: Mood is stable, no new changes  Extremities: No lower extremity edema Breast:  denies any pain or lumps or nodules in either breasts All other systems were reviewed with the patient and are negative.  I have reviewed the past medical history, past surgical history, social history and family history with the patient and they are unchanged from previous note.  ALLERGIES:  has No Known Allergies.  MEDICATIONS:  Current Outpatient Prescriptions  Medication Sig Dispense Refill  . Calcium Carbonate-Vitamin D (CALCIUM 600 + D PO) Take 1 tablet by mouth daily.     . CRESTOR 20 MG tablet Take 1 tablet by mouth daily.  0  . gabapentin (NEURONTIN) 300 MG capsule Take 1 capsule (300 mg total) by mouth at bedtime. 30 capsule 6  . hydrochlorothiazide (MICROZIDE) 12.5 MG capsule Take 12.5 mg by mouth every other day.     . lidocaine-prilocaine (EMLA) cream Apply to affected area once 30 g 3  . losartan (COZAAR) 100 MG tablet Take 100 mg by mouth daily.  0  . Vitamin  D, Ergocalciferol, (DRISDOL) 50000 units CAPS capsule Take 50,000 Units by mouth 2 (two) times a week. Monday and Thursday    . zoledronic acid (RECLAST) 5 MG/100ML SOLN Inject 5 mg into the vein once. annually      No current facility-administered medications for this visit.     PHYSICAL EXAMINATION: ECOG PERFORMANCE STATUS: 1 - Symptomatic but completely ambulatory  Vitals:   01/25/16 0933  BP:  (!) 144/83  Pulse: 67  Resp: 17  Temp: 98.5 F (36.9 C)   Filed Weights   01/25/16 0933  Weight: 134 lb 6.4 oz (61 kg)    GENERAL:alert, no distress and comfortable SKIN: skin color, texture, turgor are normal, no rashes or significant lesions EYES: normal, Conjunctiva are pink and non-injected, sclera clear OROPHARYNX:no exudate, no erythema and lips, buccal mucosa, and tongue normal  NECK: supple, thyroid normal size, non-tender, without nodularity LYMPH:  no palpable lymphadenopathy in the cervical, axillary or inguinal LUNGS: clear to auscultation and percussion with normal breathing effort HEART: regular rate & rhythm and no murmurs and no lower extremity edema ABDOMEN:abdomen soft, non-tender and normal bowel sounds MUSCULOSKELETAL:no cyanosis of digits and no clubbing  NEURO: alert & oriented x 3 with fluent speech, peripheral neuropathy in the feet EXTREMITIES: No lower extremity edema  LABORATORY DATA:  I have reviewed the data as listed   Chemistry      Component Value Date/Time   NA 140 01/25/2016 0916   K 3.8 01/25/2016 0916   CL 108 01/29/2015 1545   CO2 24 01/25/2016 0916   BUN 30.7 (H) 01/25/2016 0916   CREATININE 1.0 01/25/2016 0916      Component Value Date/Time   CALCIUM 9.8 01/25/2016 0916   ALKPHOS 113 01/25/2016 0916   AST 33 01/25/2016 0916   ALT 33 01/25/2016 0916   BILITOT 0.69 01/25/2016 0916       Lab Results  Component Value Date   WBC 3.7 (L) 01/25/2016   HGB 12.6 01/25/2016   HCT 37.3 01/25/2016   MCV 98.7 01/25/2016   PLT 155 01/25/2016   NEUTROABS 2.8 01/25/2016    ASSESSMENT & PLAN:  Breast cancer of lower-inner quadrant of right female breast ([place]) Right lumpectomy 02/03/2015: Invasive ductal carcinoma grade 2, 2.1 cm, with associated DCIS, margins are negative, 0/1 lymph node, ER 0%, PR 0%, HER-2 positiveratio 2.31, T2 N0 stage II a. (Right breast biopsy 3:30 position 12/09/2014: Invasive ductal carcinoma grade 3, ER 0%, PR  0%, HER-2 negativeratio 1.5, Ki-67 90%, 1 cm irregular mass T1C N0 stage IA clinical stage) Genetic counseling revealed NBN mutation  Treatment summary: 1. Adjuvant chemotherapy with TCH 6 cycles started 03/09/15 completed 06/22/15 now onHerceptin maintenance for 1 year  2. Followed by radiation therapy 08/11/2015 to09/05/2015 -------------------------------------------------------------------------------------------------------------------------------------------------------- Current Treatment: Herceptin maintenance ECHO 4/17: EF 65-70%  Monitoring closely for toxicities I reviewed her blood work.  Hypertension:Monitoring closely 1 44 x 83 today. Anemia:Resolved. 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 I instructed her to take Neurontin every other day for couple weeks and then stop it.  Nail changes: Encouraged the patient to apply moisturizers. Scalp eczema: Improved  We discussed the pros and cons of Neratinib. Because she did not have lymph node involvement, I did not recommend it.  RTC in 3 weeks for Herceptin maintenance in 6 months for follow-up for surveillance.   No orders of the defined types were placed in this encounter.  The patient has a  good understanding of the overall plan. she agrees with it. she will call with any problems that may develop before the next visit here.   Rulon Eisenmenger, MD 01/25/16

## 2016-02-01 DIAGNOSIS — D485 Neoplasm of uncertain behavior of skin: Secondary | ICD-10-CM | POA: Diagnosis not present

## 2016-02-01 DIAGNOSIS — L570 Actinic keratosis: Secondary | ICD-10-CM | POA: Diagnosis not present

## 2016-02-01 DIAGNOSIS — D225 Melanocytic nevi of trunk: Secondary | ICD-10-CM | POA: Diagnosis not present

## 2016-02-01 DIAGNOSIS — L821 Other seborrheic keratosis: Secondary | ICD-10-CM | POA: Diagnosis not present

## 2016-02-01 DIAGNOSIS — D210 Benign neoplasm of connective and other soft tissue of head, face and neck: Secondary | ICD-10-CM | POA: Diagnosis not present

## 2016-02-01 DIAGNOSIS — Z85828 Personal history of other malignant neoplasm of skin: Secondary | ICD-10-CM | POA: Diagnosis not present

## 2016-02-03 ENCOUNTER — Other Ambulatory Visit: Payer: Self-pay | Admitting: Surgery

## 2016-02-15 ENCOUNTER — Ambulatory Visit (HOSPITAL_BASED_OUTPATIENT_CLINIC_OR_DEPARTMENT_OTHER): Payer: Medicare Other

## 2016-02-15 ENCOUNTER — Other Ambulatory Visit: Payer: Medicare Other

## 2016-02-15 ENCOUNTER — Encounter: Payer: Self-pay | Admitting: *Deleted

## 2016-02-15 VITALS — BP 142/80 | HR 64 | Temp 98.4°F | Resp 18

## 2016-02-15 DIAGNOSIS — C50311 Malignant neoplasm of lower-inner quadrant of right female breast: Secondary | ICD-10-CM

## 2016-02-15 DIAGNOSIS — Z5112 Encounter for antineoplastic immunotherapy: Secondary | ICD-10-CM

## 2016-02-15 MED ORDER — DIPHENHYDRAMINE HCL 25 MG PO CAPS
ORAL_CAPSULE | ORAL | Status: AC
  Filled 2016-02-15: qty 1

## 2016-02-15 MED ORDER — SODIUM CHLORIDE 0.9 % IV SOLN
Freq: Once | INTRAVENOUS | Status: AC
  Administered 2016-02-15: 10:00:00 via INTRAVENOUS

## 2016-02-15 MED ORDER — DIPHENHYDRAMINE HCL 25 MG PO CAPS
25.0000 mg | ORAL_CAPSULE | Freq: Once | ORAL | Status: AC
  Administered 2016-02-15: 25 mg via ORAL

## 2016-02-15 MED ORDER — HEPARIN SOD (PORK) LOCK FLUSH 100 UNIT/ML IV SOLN
500.0000 [IU] | Freq: Once | INTRAVENOUS | Status: AC | PRN
  Administered 2016-02-15: 500 [IU]
  Filled 2016-02-15: qty 5

## 2016-02-15 MED ORDER — ACETAMINOPHEN 325 MG PO TABS
ORAL_TABLET | ORAL | Status: AC
  Filled 2016-02-15: qty 2

## 2016-02-15 MED ORDER — TRASTUZUMAB CHEMO 150 MG IV SOLR
6.0000 mg/kg | Freq: Once | INTRAVENOUS | Status: AC
  Administered 2016-02-15: 378 mg via INTRAVENOUS
  Filled 2016-02-15: qty 18

## 2016-02-15 MED ORDER — ACETAMINOPHEN 325 MG PO TABS
325.0000 mg | ORAL_TABLET | Freq: Once | ORAL | Status: AC
  Administered 2016-02-15: 325 mg via ORAL

## 2016-02-15 MED ORDER — SODIUM CHLORIDE 0.9% FLUSH
10.0000 mL | INTRAVENOUS | Status: DC | PRN
  Administered 2016-02-15: 10 mL
  Filled 2016-02-15: qty 10

## 2016-02-15 NOTE — Patient Instructions (Addendum)
[facility] [facility] Discharge Instructions for Patients Receiving Chemotherapy  Today you received the following chemotherapy agents; Herceptin   CONGRATULATIONS ON YOUR LAST TREATMENT!!!!!  To help prevent nausea and vomiting after your treatment, we encourage you to take your nausea medication as directed.    If you develop nausea and vomiting that is not controlled by your nausea medication, call the clinic.   BELOW ARE SYMPTOMS THAT SHOULD BE REPORTED IMMEDIATELY:  *FEVER GREATER THAN 100.5 F  *CHILLS WITH OR WITHOUT FEVER  NAUSEA AND VOMITING THAT IS NOT CONTROLLED WITH YOUR NAUSEA MEDICATION  *UNUSUAL SHORTNESS OF BREATH  *UNUSUAL BRUISING OR BLEEDING  TENDERNESS IN MOUTH AND THROAT WITH OR WITHOUT PRESENCE OF ULCERS  *URINARY PROBLEMS  *BOWEL PROBLEMS  UNUSUAL RASH Items with * indicate a potential emergency and should be followed up as soon as possible.  Feel free to call the clinic you have any questions or concerns. The clinic phone number is [phone].  Please show the [place] at check-in to the Emergency Department and triage nurse.

## 2016-02-19 DIAGNOSIS — Z853 Personal history of malignant neoplasm of breast: Secondary | ICD-10-CM | POA: Diagnosis not present

## 2016-02-19 DIAGNOSIS — Z452 Encounter for adjustment and management of vascular access device: Secondary | ICD-10-CM | POA: Diagnosis not present

## 2016-02-24 DIAGNOSIS — H25033 Anterior subcapsular polar age-related cataract, bilateral: Secondary | ICD-10-CM | POA: Diagnosis not present

## 2016-02-26 ENCOUNTER — Ambulatory Visit: Payer: Medicare Other | Admitting: Nurse Practitioner

## 2016-03-14 ENCOUNTER — Ambulatory Visit (INDEPENDENT_AMBULATORY_CARE_PROVIDER_SITE_OTHER): Payer: Medicare Other | Admitting: Nurse Practitioner

## 2016-03-14 ENCOUNTER — Encounter: Payer: Self-pay | Admitting: Nurse Practitioner

## 2016-03-14 VITALS — BP 118/62 | HR 74 | Resp 12 | Ht 68.25 in | Wt 132.8 lb

## 2016-03-14 DIAGNOSIS — R350 Frequency of micturition: Secondary | ICD-10-CM

## 2016-03-14 DIAGNOSIS — Z171 Estrogen receptor negative status [ER-]: Secondary | ICD-10-CM | POA: Diagnosis not present

## 2016-03-14 DIAGNOSIS — C50311 Malignant neoplasm of lower-inner quadrant of right female breast: Secondary | ICD-10-CM

## 2016-03-14 DIAGNOSIS — Z800 Family history of malignant neoplasm of digestive organs: Secondary | ICD-10-CM

## 2016-03-14 DIAGNOSIS — N812 Incomplete uterovaginal prolapse: Secondary | ICD-10-CM

## 2016-03-14 DIAGNOSIS — Z803 Family history of malignant neoplasm of breast: Secondary | ICD-10-CM

## 2016-03-14 DIAGNOSIS — Z01411 Encounter for gynecological examination (general) (routine) with abnormal findings: Secondary | ICD-10-CM | POA: Diagnosis not present

## 2016-03-14 LAB — POCT URINALYSIS DIPSTICK
Bilirubin, UA: NEGATIVE
Blood, UA: NEGATIVE
GLUCOSE UA: NEGATIVE
KETONES UA: NEGATIVE
Nitrite, UA: NEGATIVE
Protein, UA: NEGATIVE
Urobilinogen, UA: NEGATIVE
pH, UA: 8

## 2016-03-14 NOTE — Progress Notes (Signed)
76 y.o. G98P2002 Widowed  Caucasian Fe here for annual exam.   She has been diagnosed with right breast cancer and underwent a lumpectomy on 02/03/15.  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  She then underwent genetic counseling and she was positive for NBN mutation.  BRCA was negative per pt.   She had chemotherapy X 6, then radiation treatments. Completed Herceptin X 12. She feels well. Port cath has been removed this month.  Received #3 ring with support pessary on 12/14/15.  #4 ring with support was uncomfortable for the patient when we did the fitting. UTI 01/06/16 treated with Septra.  Today slight dysuria that has just started.  Denies problems with the pessary.  She is using no vaginal lubrication with the pessary. She does go out with friends but not dating or SA.  Patient's last menstrual period was 01/17/1982 (approximate).          Sexually active: No.  The current method of family planning is post menopausal status.    Exercising: No.  The patient does not participate in regular exercise at present. Smoker:  no  Health Maintenance: Pap:  02-24-15 WNL  02-13-12 WNL  MMG:  12-29-15 NEG BI RADS 2 Colonoscopy:  2012 normal per patient. Dr. Henrene Pastor needs repeat this year BMD:   05-07-14 Osteoporosis  will repeat at Dr. Virgina Jock TDaP:  10-08-10 Shingles: 10-13-11 Pneumonia: 04-25-14 - Prevnar 13 Hep C and HIV: not indicated due to age  Labs: PCP and [facility]    reports that she has never smoked. She has never used smokeless tobacco. She reports that she drinks about 4.2 oz of alcohol per week . She reports that she does not use drugs.  Past Medical History:  Diagnosis Date  . Breast cancer ([place]) 11/2014  . Family history of breast cancer   . Family history of colon cancer   . History of radiation therapy 08/11/15- 09/23/2015   Right Breast  . Hyperlipidemia   . Hypertension   . Kidney stone on left side   . Monoallelic mutation of NBN gene    . Osteoporosis   . Skin cancer of nose     Past Surgical History:  Procedure Laterality Date  . APPENDECTOMY    . BREAST LUMPECTOMY WITH RADIOACTIVE SEED AND SENTINEL LYMPH NODE BIOPSY Right 02/03/2015   Procedure: RIGHT BREAST LUMPECTOMY WITH RADIOACTIVE SEED AND RIGHT SENTINEL LYMPH NODE BIOPSY;  Surgeon: [name], MD;  Location: [place];  Service: General;  Laterality: Right;  . BREAST SURGERY  11/2012   biopsy, benign breast tissue  . COLONOSCOPY  06/2010   rec  . LITHOTRIPSY     kidney stone on left side  . OVARIAN CYST REMOVAL  age 18  . POLYPECTOMY  04/2005   colon  . PORTACATH PLACEMENT Left 02/03/2015   Procedure: INSERTION PORT-A-CATH WITH ULTRA SOUND GUIDANCE;  Surgeon: [name], MD;  Location: [place];  Service: General;  Laterality: Left;    Current Outpatient Prescriptions  Medication Sig Dispense Refill  . Calcium Carbonate-Vitamin D (CALCIUM 600 + D PO) Take 1 tablet by mouth daily.     . CRESTOR 20 MG tablet Take 1 tablet by mouth daily.  0  . gabapentin (NEURONTIN) 300 MG capsule Take 1 capsule (300 mg total) by mouth at bedtime. 30 capsule 6  . hydrochlorothiazide (MICROZIDE) 12.5 MG capsule Take 12.5 mg by mouth every other day.     Marland Kitchen  losartan (COZAAR) 100 MG tablet Take 100 mg by mouth daily.  0  . Vitamin D, Ergocalciferol, (DRISDOL) 50000 units CAPS capsule Take 50,000 Units by mouth 2 (two) times a week. Monday and Thursday    . zoledronic acid (RECLAST) 5 MG/100ML SOLN Inject 5 mg into the vein once. annually      No current facility-administered medications for this visit.     Family History  Problem Relation Age of Onset  . Colon cancer Sister 42  . Breast cancer Mother 43  . Hypertension Mother   . Osteoporosis Mother   . Heart disease Father     CABG  . Dementia Father   . Throat cancer Brother 49  . Breast cancer Sister 81  . Lymphoma Sister 12  . Melanoma Sister   . Skin cancer Brother   . Skin  cancer Daughter     ROS:  Pertinent items are noted in HPI.  Otherwise, a comprehensive ROS was negative.  Exam:   BP 118/62 (BP Location: Left Arm, Patient Position: Sitting, Cuff Size: Normal)   Pulse 74   Resp 12   Ht 5' 8.25" (1.734 m)   Wt 132 lb 12.8 oz (60.2 kg)   LMP 01/17/1982 (Approximate)   BMI 20.04 kg/m  Height: 5' 8.25" (173.4 cm) Ht Readings from Last 3 Encounters:  03/14/16 5' 8.25" (1.734 m)  01/25/16 '5\' 9"'  (1.753 m)  01/06/16 '5\' 9"'  (1.753 m)    General appearance: alert, cooperative and appears stated age Head: Normocephalic, without obvious abnormality, atraumatic Neck: no adenopathy, supple, symmetrical, trachea midline and thyroid normal to inspection and palpation Lungs: clear to auscultation bilaterally Breasts: normal appearance, no masses or tenderness, on the left.  On the right breast is radiation and surgical changes.  On the left with recent removal of port there is a bruising and a small scab that is hanging off making clothing getting caught and is painful.  Area is cleaned and top of scab is removed for comfort.  There is no bleeding. Heart: regular rate and rhythm Abdomen: soft, non-tender; no masses,  no organomegaly Extremities: extremities normal, atraumatic, no cyanosis or edema Skin: Skin color, texture, turgor normal. No rashes or lesions Lymph nodes: Cervical, supraclavicular, and axillary nodes normal. No abnormal inguinal nodes palpated Neurologic: Grossly normal   Pelvic: External genitalia:  no lesions              Urethra:  normal appearing urethra with no masses, tenderness or lesions              Bartholin's and Skene's: normal                 Vagina: # 3 ring pessary was removed without difficulty. Normal appearing vagina with normal color and discharge, no lesions              Cervix: anteverted,  There is a slight pink area on cervix at 10: position without erosion              Pap taken: No. Bimanual Exam:  Uterus:  normal  size, contour, position, consistency, mobility, non-tender              Adnexa: no mass, fullness, tenderness               Rectovaginal: Confirms               Anus:  normal sphincter tone, no lesions  Chaperone present: yes  A:  Well Woman with normal exam  Postmenopausal - off Prempro 2003 Borderline osteoporosis - prior use of Evista and Fosamax now on  Reclast every other year per PCP last dose given 07/26/13 OAB - doing well on Vesicare - given by PCP             Right breast cancer with lumpectomy 02/03/15   Third degree cystocele, almost second degree uterine prolapse, and first degree rectocele with #3 ring pessary  P:   Reviewed health and wellness pertinent to exam  Pap smear was not done  Mammogram is due 12/2016  Since unable to use vaginal estrogen cream - will do Trimosan 1/2 applicator weekly  Counseled on breast self exam, mammography screening, adequate intake of calcium and vitamin D, diet and exercise, Kegel's exercises return annually or prn  An After Visit Summary was printed and given to the patient.

## 2016-03-14 NOTE — Patient Instructions (Signed)

## 2016-03-14 NOTE — Progress Notes (Signed)
Encounter reviewed by Dr. Brook Amundson C. Silva.  

## 2016-03-15 DIAGNOSIS — H2513 Age-related nuclear cataract, bilateral: Secondary | ICD-10-CM | POA: Diagnosis not present

## 2016-03-15 DIAGNOSIS — H04123 Dry eye syndrome of bilateral lacrimal glands: Secondary | ICD-10-CM | POA: Diagnosis not present

## 2016-03-15 LAB — URINE CULTURE

## 2016-04-06 NOTE — Progress Notes (Signed)
CLINIC:  Survivorship   REASON FOR VISIT:  Routine follow-up post-treatment for a recent history of breast cancer.  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 Genes tested include:  ATM, BARD1, BRCA1, BRCA2, BRIP1, CDH1, CHEK2, EPCAM, FANCC, MLH1, MSH2, MSH6, NBN, PALB2, PMS2, PTEN, RAD51C, RAD51D, TP53, and XRCC2.      02/03/2015 Surgery    Right lumpectomy: Invasive ductal carcinoma grade 2, 2.1 cm, with associated DCIS, margins are negative, 0/1 lymph node, ER 0%, PR 0%, HER-2 positive ratio 2.31, T2 N0 stage II a      03/09/2015 - 06/22/2015 Chemotherapy    Adjuvant chemotherapy with [place] 6 followed by Herceptin maintenance for 1 year      08/11/2015 - 09/23/2015 Radiation Therapy    Adjuvant radiation therapy [name]):  1) Right Breast / 50 Gy in 25 fractions ; 2) Right Breast Boost / 10 Gy in 5 fractions       INTERVAL HISTORY:  Ms. Kilfoyle presents to the [facility] today for our initial meeting to review her survivorship care plan detailing her treatment course for breast cancer, as well as monitoring long-term side effects of that treatment, education regarding health maintenance, screening, and overall wellness and health promotion.     Shalva is doing well today.  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  She can still walk, and continues to do so.  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      REVIEW OF SYSTEMS:  Review of Systems  Constitutional: Negative for  chills, fever, malaise/fatigue and weight loss.  HENT: Negative for ear pain, hearing loss and tinnitus.   Eyes: Negative for blurred vision, double vision and pain.  Respiratory: Negative for cough and shortness of breath.   Cardiovascular: Negative for chest pain and palpitations.  Gastrointestinal: Negative for abdominal pain, blood in stool, constipation, diarrhea, heartburn, nausea and vomiting.  Genitourinary: Negative for dysuria.  Musculoskeletal: Negative for joint pain.  Skin: Negative for rash.  Neurological: Positive for tingling. Negative for dizziness, focal weakness, weakness and headaches.  Endo/Heme/Allergies: Negative for environmental allergies. Does not bruise/bleed easily.  Psychiatric/Behavioral: Negative for depression and memory loss. The patient is not nervous/anxious and does not have insomnia.   Breast: Denies any new nodularity, masses, tenderness, nipple changes, or nipple discharge.    ONCOLOGY TREATMENT TEAM:  1. Surgeon:  Dr. Lucia Gaskins at [facility] 2. Medical Oncologist: Dr. Lindi Adie  3. Radiation Oncologist: Dr. [name]    PAST MEDICAL/SURGICAL HISTORY:  Past Medical History:  Diagnosis Date  . Breast cancer ([place]) 11/2014  . Family history of breast cancer   . Family history of colon cancer   . History of radiation therapy 08/11/15- 09/23/2015   Right Breast  . Hyperlipidemia   . Hypertension   . Kidney stone on left side   . Monoallelic mutation of NBN gene   . Osteoporosis   . Skin cancer of nose  Past Surgical History:  Procedure Laterality Date  . APPENDECTOMY    . BREAST LUMPECTOMY WITH RADIOACTIVE SEED AND SENTINEL LYMPH NODE BIOPSY Right 02/03/2015   Procedure: RIGHT BREAST LUMPECTOMY WITH RADIOACTIVE SEED AND RIGHT SENTINEL LYMPH NODE BIOPSY;  Surgeon: [name], MD;  Location: [facility];  Service: General;  Laterality: Right;  . BREAST SURGERY  11/2012   biopsy, benign breast tissue  . COLONOSCOPY  06/2010     rec  . LITHOTRIPSY     kidney stone on left side  . OVARIAN CYST REMOVAL  age 62  . POLYPECTOMY  04/2005   colon  . PORTACATH PLACEMENT Left 02/03/2015   Procedure: INSERTION PORT-A-CATH WITH ULTRA SOUND GUIDANCE;  Surgeon: [name], MD;  Location: [facility];  Service: General;  Laterality: Left;     ALLERGIES:  No Known Allergies   CURRENT MEDICATIONS:  Outpatient Encounter Prescriptions as of 04/07/2016  Medication Sig  . Calcium Carbonate-Vitamin D (CALCIUM 600 + D PO) Take 1 tablet by mouth daily.   . CRESTOR 20 MG tablet Take 1 tablet by mouth daily.  [place] Kitchen gabapentin (NEURONTIN) 300 MG capsule Take 1 capsule (300 mg total) by mouth at bedtime.  . hydrochlorothiazide (MICROZIDE) 12.5 MG capsule Take 12.5 mg by mouth every other day.   . losartan (COZAAR) 100 MG tablet Take 100 mg by mouth daily.  . Vitamin D, Ergocalciferol, (DRISDOL) 50000 units CAPS capsule Take 50,000 Units by mouth 2 (two) times a week. Monday and Thursday  . zoledronic acid (RECLAST) 5 MG/100ML SOLN Inject 5 mg into the vein once. annually    No facility-administered encounter medications on file as of 04/07/2016.      ONCOLOGIC FAMILY HISTORY:  Family History  Problem Relation Age of Onset  . Colon cancer Sister 74  . Breast cancer Mother 85  . Hypertension Mother   . Osteoporosis Mother   . Heart disease Father     CABG  . Dementia Father   . Throat cancer Brother 66  . Breast cancer Sister 75  . Lymphoma Sister 47  . Melanoma Sister   . Skin cancer Brother   . 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 Genes tested include:  ATM, BARD1, BRCA1, BRCA2, BRIP1, CDH1, CHEK2, EPCAM, FANCC, MLH1, MSH2, MSH6, NBN, PALB2, PMS2, PTEN, RAD51C, RAD51D, TP53, and XRCC2.  SOCIAL HISTORY:  MAVA SUARES is single lives with her son (occasionally) in [place], [place].  She also has a daughter who  lives in [place].  Ms. Tufte is currently retired.  She denies any current or history of tobacco, alcohol, or illicit drug use.     PHYSICAL EXAMINATION:  Vital Signs:  There were no vitals filed for this visit. There were no vitals filed for this visit. General: Well-nourished, well-appearing female in no acute distress.  She is unaccompanied today.   HEENT: Head is normocephalic.  Pupils equal and reactive to light. Conjunctivae clear without exudate.  Sclerae anicteric. Oral mucosa is pink, moist.  Oropharynx is pink without lesions or erythema.  Lymph: No cervical, supraclavicular, or infraclavicular lymphadenopathy noted on palpation.  Cardiovascular: Regular rate and rhythm.Marland Kitchen Respiratory: Clear to auscultation bilaterally. Chest expansion symmetric; breathing non-labored.  GI: Abdomen soft and round; non-tender, non-distended. Bowel sounds normoactive.  GU: Deferred.  Neuro: No focal deficits. Steady gait.  Psych: Mood and affect normal and appropriate for situation.  Extremities: No  edema. Skin: Warm and dry.  LABORATORY DATA:  None for this visit.  DIAGNOSTIC IMAGING:  None for this visit.    ASSESSMENT AND PLAN:  Ms.. Niehoff is a pleasant 76 y.o. female with Stage IIA right breast invasive ductal carcinoma, ER-/PR-/HER2+, diagnosed in 2016, treated with lumpectomy, adjuvant chemotherapy and Trastuzumab, and adjuvant radiation therapy.  She presents to the Survivorship Clinic for our initial meeting and routine follow-up post-completion of treatment for breast cancer.    1. Stage IIA right breast cancer:  Ms. Panameno is continuing to recover from definitive treatment for breast cancer. She will follow-up with her medical oncologist, Dr. Lindi Adie in July, 2018 with history and physical exam per surveillance protocol.  Today, a comprehensive survivorship care plan and treatment summary was reviewed with the patient today detailing her breast cancer diagnosis, treatment  course, potential late/long-term effects of treatment, appropriate follow-up care with recommendations for the future, and patient education resources.  A copy of this summary, along with a letter will be sent to the patient's primary care provider via mail/fax/In Basket message after today's visit.    2. Peripheral neuropathy: Patient stopped Gabapentin because she didn't feel it was working.  I recommended she try OTC vitamin e and super b complex.    3. Bone health:  Given Ms. Fayad's age/history of breast cancer she is at risk for bone demineralization.  Her last DEXA scan was in 2016 and was consistent with osteoporosis.  She is taking Reclast.  She will continue with calcium, vitamin d and weight bearing exercises.    4. Cancer screening:  Due to Ms. Milliron's history and her age, she should receive screening for skin cancers and colon cancer.  The information and recommendations are listed on the patient's comprehensive care plan/treatment summary and were reviewed in detail with the patient.    5. Health maintenance and wellness promotion: Ms. Marcou was encouraged to consume 5-7 servings of fruits and vegetables per day. We reviewed the "Nutrition Rainbow" handout, as well as the handout "Take Control of Your Health and Reduce Your Cancer Risk" from the [facility].  She was also encouraged to engage in moderate to vigorous exercise for 30 minutes per day most days of the week. We discussed the [employer] fitness program, which is designed for cancer survivors to help them become more physically fit after cancer treatments.  She was instructed to limit her alcohol consumption and continue to abstain from tobacco use.     6. Support services/counseling: It is not uncommon for this period of the patient's cancer care trajectory to be one of many emotions and stressors.  We discussed an opportunity for her to participate in the next session of [facility] ("Finding Your New  Normal") support group series designed for patients after they have completed treatment.   Ms. Blatz was encouraged to take advantage of our many other support services programs, support groups, and/or counseling in coping with her new life as a cancer survivor after completing anti-cancer treatment.  She was offered support today through active listening and expressive supportive counseling.  She was given information regarding our available services and encouraged to contact me with any questions or for help enrolling in any of our support group/programs.    Dispo:   -Return to cancer center in July, 2018 as scheduled with Dr. Lindi Adie -She is welcome to return back to the Survivorship Clinic at any time; no additional follow-up needed at this time.  -Consider referral back to  survivorship as a long-term survivor for continued surveillance  A total of (30) minutes of face-to-face time was spent with this patient with greater than 50% of that time in counseling and care-coordination.   Gardenia Phlegm, [place] [phone]   Note: PRIMARY CARE PROVIDER Precious Reel, [place] [phone]

## 2016-04-07 ENCOUNTER — Encounter: Payer: Self-pay | Admitting: Adult Health

## 2016-04-07 ENCOUNTER — Ambulatory Visit (HOSPITAL_BASED_OUTPATIENT_CLINIC_OR_DEPARTMENT_OTHER): Payer: Medicare Other | Admitting: Adult Health

## 2016-04-07 VITALS — BP 137/77 | HR 100 | Temp 97.3°F | Resp 18 | Ht 68.25 in | Wt 133.8 lb

## 2016-04-07 DIAGNOSIS — G620 Drug-induced polyneuropathy: Secondary | ICD-10-CM | POA: Diagnosis not present

## 2016-04-07 DIAGNOSIS — C50311 Malignant neoplasm of lower-inner quadrant of right female breast: Secondary | ICD-10-CM

## 2016-04-07 DIAGNOSIS — Z171 Estrogen receptor negative status [ER-]: Secondary | ICD-10-CM

## 2016-05-10 DIAGNOSIS — I10 Essential (primary) hypertension: Secondary | ICD-10-CM | POA: Diagnosis not present

## 2016-05-10 DIAGNOSIS — C50311 Malignant neoplasm of lower-inner quadrant of right female breast: Secondary | ICD-10-CM | POA: Diagnosis not present

## 2016-05-10 DIAGNOSIS — M810 Age-related osteoporosis without current pathological fracture: Secondary | ICD-10-CM | POA: Diagnosis not present

## 2016-05-10 DIAGNOSIS — G629 Polyneuropathy, unspecified: Secondary | ICD-10-CM | POA: Diagnosis not present

## 2016-05-10 DIAGNOSIS — D649 Anemia, unspecified: Secondary | ICD-10-CM | POA: Diagnosis not present

## 2016-06-14 ENCOUNTER — Encounter: Payer: Self-pay | Admitting: Nurse Practitioner

## 2016-06-14 ENCOUNTER — Ambulatory Visit (INDEPENDENT_AMBULATORY_CARE_PROVIDER_SITE_OTHER): Payer: Medicare Other | Admitting: Nurse Practitioner

## 2016-06-14 VITALS — BP 108/70 | HR 64 | Ht 68.25 in | Wt 134.0 lb

## 2016-06-14 DIAGNOSIS — Z4689 Encounter for fitting and adjustment of other specified devices: Secondary | ICD-10-CM

## 2016-06-14 DIAGNOSIS — Z1211 Encounter for screening for malignant neoplasm of colon: Secondary | ICD-10-CM

## 2016-06-14 NOTE — Patient Instructions (Signed)
Recheck in 3 months.

## 2016-06-14 NOTE — Progress Notes (Signed)
Subjective:   76 y.o. Widowed White female [id number] here for pessary check.  Patient has been using following pessary style and size:  #3 ring pessary with support..  She describes the following issues with the pessary:  Pessary fell out last week in [place] at reception for daughters wedding.  Use of protective clothing such as Depends or pads Yes.   Problems with protective clothing with rash No.  Constipation issues with use of pessary No.  She is not sexually active.     Compliant to use of vaginal cream: No. She uses only Trimosan due to history of breast cancer.  ROS:   no breast pain or new or enlarging lumps on self exam,  no abnormal bleeding, pelvic pain or discharge, no dysuria, trouble voiding or hematuria.   General Exam:    BP 108/70 (BP Location: Right Arm, Patient Position: Sitting, Cuff Size: Normal)   Pulse 64   Ht 5' 8.25" (1.734 m)   Wt 134 lb (60.8 kg)   LMP 01/17/1982 (Approximate)   BMI 20.23 kg/m   General appearance: alert, cooperative, appears stated age and no distress  Pelvic: External genitalia:  no lesions   Pessary was removed before visit today   In correct position - must not have been since fell out and she felt a bulge as though the pessary was not fitting well.               Urethra: normal appearing urethra with no masses, tenderness or lesions              Vagina: normal appearing vagina with normal color and discharge, no lesions.  There are No abrasions or ulcerations.               Cervix: normal appearance  Cervical lesions were not found   Bimanual Exam:  Uterus:  not examined                               Adnexa:    not indicated                             Pessary was removed before today's visit.  Pessary size # 4 ring with support was placed to see if fitting was better than the size # 3.  She was able to walk, sit, move, and void without the pessary coming out or feeling uncomfortable.  New Pessary was inserted.  Patient tolerated  procedure well.    Assessment :  SUI, Urge incontinence, Cystocele- symptomatic   Use of pessary continued   History of breast cancer   History of colon polyps and needs to have another screening.   Plan:    Return to office in 3 months for recheck.         Pt to call if pessary is ill fitting or comes out     Referral to Maryanna Shape for colonoscopy  An After Visit Summary was printed and given to the patient  Charged for pessary and note for [facility] for size of pessary for re-order.

## 2016-06-15 ENCOUNTER — Encounter: Payer: Self-pay | Admitting: Internal Medicine

## 2016-06-15 NOTE — Progress Notes (Signed)
Reviewed personally.  M. Suzanne Raguel Kosloski, MD.  

## 2016-07-11 DIAGNOSIS — H2512 Age-related nuclear cataract, left eye: Secondary | ICD-10-CM | POA: Diagnosis not present

## 2016-07-11 DIAGNOSIS — H04123 Dry eye syndrome of bilateral lacrimal glands: Secondary | ICD-10-CM | POA: Diagnosis not present

## 2016-07-11 DIAGNOSIS — H2511 Age-related nuclear cataract, right eye: Secondary | ICD-10-CM | POA: Diagnosis not present

## 2016-07-25 ENCOUNTER — Ambulatory Visit (HOSPITAL_BASED_OUTPATIENT_CLINIC_OR_DEPARTMENT_OTHER): Payer: Medicare Other | Admitting: Hematology and Oncology

## 2016-07-25 ENCOUNTER — Encounter: Payer: Self-pay | Admitting: Hematology and Oncology

## 2016-07-25 DIAGNOSIS — Z171 Estrogen receptor negative status [ER-]: Secondary | ICD-10-CM

## 2016-07-25 DIAGNOSIS — Z853 Personal history of malignant neoplasm of breast: Secondary | ICD-10-CM

## 2016-07-25 DIAGNOSIS — C50311 Malignant neoplasm of lower-inner quadrant of right female breast: Secondary | ICD-10-CM

## 2016-07-25 MED ORDER — SUPER B-50 COMPLEX PO CAPS
1.0000 | ORAL_CAPSULE | Freq: Every day | ORAL | Status: DC
Start: 2016-07-25 — End: 2017-03-02

## 2016-07-25 MED ORDER — VITAMIN E 45 MG (100 UNIT) PO CAPS: 100.0000 [IU] | ORAL_CAPSULE | Freq: Every day | ORAL | Status: DC

## 2016-07-25 NOTE — Progress Notes (Signed)
Patient Care Team: [name], MD as PCP - General (Internal Medicine) [name], MD as Consulting Physician (General Surgery) [name], MD as Consulting Physician (Hematology and Oncology) Delice Bison Charlestine Massed, NP as Nurse Practitioner (Hematology and Oncology)  DIAGNOSIS:  Encounter Diagnosis  Name Primary?  . 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 Genes tested include:  ATM, BARD1, BRCA1, BRCA2, BRIP1, CDH1, CHEK2, EPCAM, FANCC, MLH1, MSH2, MSH6, NBN, PALB2, PMS2, PTEN, RAD51C, RAD51D, TP53, and XRCC2.      02/03/2015 Surgery    Right lumpectomy: Invasive ductal carcinoma grade 2, 2.1 cm, with associated DCIS, margins are negative, 0/1 lymph node, ER 0%, PR 0%, HER-2 positive ratio 2.31, T2 N0 stage II a      03/09/2015 - 06/22/2015 Chemotherapy    Adjuvant chemotherapy with [place] 6 followed by Herceptin maintenance for 1 year      08/11/2015 - 09/23/2015 Radiation Therapy    Adjuvant radiation therapy [name]):  1) Right Breast / 50 Gy in 25 fractions ; 2) Right Breast Boost / 10 Gy in 5 fractions       CHIEF COMPLIANT: Surveillance of breast cancer  INTERVAL HISTORY: Shawna [name] is a 76 year old with above-mentioned history of right breast cancer treated with lumpectomy adjuvant chemotherapy and radiation. She is here for six-month follow-up and reports to be doing quite well. She does have some tendinitis in the right wrist.  She has been exercising 4 times a week. Denies any lumps or nodules in breast. The breast and feels tender  REVIEW OF SYSTEMS:   Constitutional: Denies fevers, chills or abnormal weight loss Eyes: Denies blurriness of vision Ears, nose, mouth, throat, and face: Denies mucositis or sore throat Respiratory: Denies cough, dyspnea or wheezes Cardiovascular: Denies palpitation, chest discomfort Gastrointestinal:  Denies nausea, heartburn or change in bowel habits Skin: Denies abnormal skin rashes Lymphatics: Denies new lymphadenopathy or easy bruising Neurological:Denies numbness, tingling or new weaknesses Behavioral/Psych: Mood is stable, no new changes  Extremities: No lower extremity edema Breast:  denies any pain or lumps or nodules in either breasts All other systems were reviewed with the patient and are negative.  I have reviewed the past medical history, past surgical history, social history and family history with the patient and they are unchanged from previous note.  ALLERGIES:  has No Known Allergies.  MEDICATIONS:  Current Outpatient Prescriptions  Medication Sig Dispense Refill  . B Complex-Biotin-FA (SUPER B-50 COMPLEX) CAPS Take 1 capsule by mouth daily.    . Calcium Carbonate-Vitamin D (CALCIUM 600 + D PO) Take 1 tablet by mouth daily.     . CRESTOR 20 MG tablet Take 1 tablet by mouth daily.  0  . hydrochlorothiazide (MICROZIDE) 12.5 MG capsule Take 12.5 mg by mouth every other day.     . losartan (COZAAR) 100 MG tablet Take 100 mg by mouth daily.  0  . Vitamin D, Ergocalciferol, (  DRISDOL) 50000 units CAPS capsule Take 50,000 Units by mouth 2 (two) times a week. Monday and Thursday    . vitamin E 100 UNIT capsule Take 1 capsule (100 Units total) by mouth daily.    . zoledronic acid (RECLAST) 5 MG/100ML SOLN Inject 5 mg into the vein once. annually      No current facility-administered medications for this visit.     PHYSICAL EXAMINATION: ECOG PERFORMANCE STATUS: 1 -  Symptomatic but completely ambulatory  Vitals:   07/25/16 1006  BP: (!) 145/79  Pulse: 65  Resp: 17  Temp: 98.3 F (36.8 C)   Filed Weights   07/25/16 1006  Weight: 136 lb 3.2 oz (61.8 kg)    GENERAL:alert, no distress and comfortable SKIN: skin color, texture, turgor are normal, no rashes or significant lesions EYES: normal, Conjunctiva are pink and non-injected, sclera clear OROPHARYNX:no exudate, no erythema and lips, buccal mucosa, and tongue normal  NECK: supple, thyroid normal size, non-tender, without nodularity LYMPH:  no palpable lymphadenopathy in the cervical, axillary or inguinal LUNGS: clear to auscultation and percussion with normal breathing effort HEART: regular rate & rhythm and no murmurs and no lower extremity edema ABDOMEN:abdomen soft, non-tender and normal bowel sounds MUSCULOSKELETAL:no cyanosis of digits and no clubbing  NEURO: alert & oriented x 3 with fluent speech, no focal motor/sensory deficits EXTREMITIES: No lower extremity edema BREAST: No palpable masses or nodules in either right or left breasts. No palpable axillary supraclavicular or infraclavicular adenopathy no breast tenderness or nipple discharge. (exam performed in the presence of a chaperone)  LABORATORY DATA:  I have reviewed the data as listed   Chemistry      Component Value Date/Time   NA 140 01/25/2016 0916   K 3.8 01/25/2016 0916   CL 108 01/29/2015 1545   CO2 24 01/25/2016 0916   BUN 30.7 (H) 01/25/2016 0916   CREATININE 1.0 01/25/2016 0916      Component Value Date/Time   CALCIUM 9.8 01/25/2016 0916   ALKPHOS 113 01/25/2016 0916   AST 33 01/25/2016 0916   ALT 33 01/25/2016 0916   BILITOT 0.69 01/25/2016 0916       Lab Results  Component Value Date   WBC 3.7 (L) 01/25/2016   HGB 12.6 01/25/2016   HCT 37.3 01/25/2016   MCV 98.7 01/25/2016   PLT 155 01/25/2016   NEUTROABS 2.8 01/25/2016    ASSESSMENT & PLAN:  Breast cancer of lower-inner quadrant of right  female breast ([place]) Right lumpectomy 02/03/2015: Invasive ductal carcinoma grade 2, 2.1 cm, with associated DCIS, margins are negative, 0/1 lymph node, ER 0%, PR 0%, HER-2 positiveratio 2.31, T2 N0 stage II a. 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 Adjuvant chemotherapy with [place] 6 cycles started 03/09/15 completed 06/22/15 tookHerceptin maintenance for 1 year until 02/15/2016 2. Followed by radiation therapy 08/11/2015 to09/05/2015 -------------------------------------------------------------------------------------------------------------------------------------------------------- Surveillance of breast cancer: 1. Mammograms December 2017: Benign breast density category C 2. Breast exam 07/25/2016: Benign  Painful tendinitis involving the right wrist Return to clinic in 1 year for follow-up     I spent 25 minutes talking to the patient of which more than half was spent in counseling and coordination of care.  No orders of the defined types were placed in this encounter.  The patient has a good understanding of  the overall plan. she agrees with it. she will call with any problems that may develop before the next visit here.   Rulon Eisenmenger, MD 07/25/16

## 2016-07-25 NOTE — Assessment & Plan Note (Addendum)
Right lumpectomy 02/03/2015: Invasive ductal carcinoma grade 2, 2.1 cm, with associated DCIS, margins are negative, 0/1 lymph node, ER 0%, PR 0%, HER-2 positiveratio 2.31, T2 N0 stage II a. 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 Adjuvant chemotherapy with [place] 6 cycles started 03/09/15 completed 06/22/15 tookHerceptin maintenance for 1 year until 02/15/2016 2. Followed by radiation therapy 08/11/2015 to09/05/2015 -------------------------------------------------------------------------------------------------------------------------------------------------------- Surveillance of breast cancer: 1. Mammograms December 2017: Benign breast density category C 2. Breast exam 07/25/2016: Benign  Painful tendinitis involving the right wrist Return to clinic in 1 year for follow-up

## 2016-07-29 ENCOUNTER — Ambulatory Visit (AMBULATORY_SURGERY_CENTER): Payer: Self-pay

## 2016-07-29 ENCOUNTER — Telehealth: Payer: Self-pay

## 2016-07-29 VITALS — Ht 68.5 in | Wt 136.0 lb

## 2016-07-29 DIAGNOSIS — Z860100 Personal history of colon polyps, unspecified: Secondary | ICD-10-CM

## 2016-07-29 DIAGNOSIS — Z800 Family history of malignant neoplasm of digestive organs: Secondary | ICD-10-CM

## 2016-07-29 MED ORDER — SUPREP BOWEL PREP KIT 17.5-3.13-1.6 GM/177ML PO SOLN: 1.0000 | Freq: Once | ORAL | 0 refills | Status: AC

## 2016-07-29 NOTE — Telephone Encounter (Signed)
No issue

## 2016-07-29 NOTE — Progress Notes (Signed)
No allergies to eggs or soy No past problems with anesthesia No diet meds No home oxygen  Declined emmi 

## 2016-07-29 NOTE — Telephone Encounter (Signed)
Dr Henrene Pastor, Pt wanted to be sure that she does not need her MD to remove her pessary (artificial perineal floor device to hold her uterus from prolapsing).  I told her I didn't think it would be an issue but I would ask you to advise. Thank you, Harish Bram/PV

## 2016-08-02 ENCOUNTER — Encounter: Payer: Self-pay | Admitting: Internal Medicine

## 2016-08-03 ENCOUNTER — Telehealth: Payer: Self-pay | Admitting: Internal Medicine

## 2016-08-03 NOTE — Telephone Encounter (Signed)
Returned patients call. A pay no more than 50.00 dollars coupon was sent to [facility]. Patient was notified and said that she would pick up the prep.   Riki Sheer, LPN ( [place] )

## 2016-08-12 ENCOUNTER — Encounter: Payer: Self-pay | Admitting: Internal Medicine

## 2016-08-12 ENCOUNTER — Ambulatory Visit (AMBULATORY_SURGERY_CENTER): Payer: Medicare Other | Admitting: Internal Medicine

## 2016-08-12 VITALS — BP 154/79 | HR 62 | Temp 96.8°F | Resp 13 | Ht 68.5 in | Wt 136.0 lb

## 2016-08-12 DIAGNOSIS — D125 Benign neoplasm of sigmoid colon: Secondary | ICD-10-CM

## 2016-08-12 DIAGNOSIS — Z86010 Personal history of colonic polyps: Secondary | ICD-10-CM

## 2016-08-12 DIAGNOSIS — Z800 Family history of malignant neoplasm of digestive organs: Secondary | ICD-10-CM | POA: Diagnosis not present

## 2016-08-12 DIAGNOSIS — D122 Benign neoplasm of ascending colon: Secondary | ICD-10-CM

## 2016-08-12 DIAGNOSIS — K635 Polyp of colon: Secondary | ICD-10-CM

## 2016-08-12 MED ORDER — SODIUM CHLORIDE 0.9 % IV SOLN: 500.0000 mL | INTRAVENOUS | Status: DC

## 2016-08-12 NOTE — Op Note (Signed)
[facility] Patient Name: Shawna [name] Procedure Date: 08/12/2016 2:29 PM MRN: [id number] Endoscopist: Docia Chuck. Henrene Pastor , MD Age: 76 Referring MD:  Date of Birth: 1940/05/21 Gender: Female Account #: [id number] Procedure:                Colonoscopy, with cold snare polypectomy x 1 Indications:              High risk colon cancer surveillance: Personal                            history of non-advanced adenoma. Prior examinations                            2003, 2007, 2012 (negative). Medicines:                Monitored Anesthesia Care Procedure:                Pre-Anesthesia Assessment:                           - Prior to the procedure, a History and Physical                            was performed, and patient medications and                            allergies were reviewed. The patient's tolerance of                            previous anesthesia was also reviewed. The risks                            and benefits of the procedure and the sedation                            options and risks were discussed with the patient.                            All questions were answered, and informed consent                            was obtained. Prior Anticoagulants: The patient has                            taken no previous anticoagulant or antiplatelet                            agents. ASA Grade Assessment: II - A patient with                            mild systemic disease. After reviewing the risks                            and benefits, the patient was deemed in  satisfactory condition to undergo the procedure.                           After obtaining informed consent, the colonoscope                            was passed under direct vision. Throughout the                            procedure, the patient's blood pressure, pulse, and                            oxygen saturations were monitored continuously. The   Colonoscope was introduced through the anus and                            advanced to the the cecum, identified by                            appendiceal orifice and ileocecal valve. The                            ileocecal valve, appendiceal orifice, and rectum                            were photographed. The quality of the bowel                            preparation was good. The colonoscopy was performed                            without difficulty. The patient tolerated the                            procedure well. The bowel preparation used was                            SUPREP. Scope In: 2:40:12 PM Scope Out: 2:57:36 PM Scope Withdrawal Time: 0 hours 12 minutes 15 seconds  Total Procedure Duration: 0 hours 17 minutes 24 seconds  Findings:                 A 2 mm polyp was found in the ascending colon. The                            polyp was removed with a cold snare. Resection and                            retrieval were complete.                           Multiple diverticula were found in the sigmoid                            colon.  Internal hemorrhoids were found during retroflexion.                           The exam was otherwise without abnormality on                            direct and retroflexion views. Complications:            No immediate complications. Estimated blood loss:                            None. Estimated Blood Loss:     Estimated blood loss: none. Impression:               - One 2 mm polyp in the ascending colon, removed                            with a cold snare. Resected and retrieved.                           - Diverticulosis in the sigmoid colon.                           - Internal hemorrhoids.                           - The examination was otherwise normal on direct                            and retroflexion views. Recommendation:           - Repeat colonoscopy is not recommended for                             surveillance.                           - Patient has a contact number available for                            emergencies. The signs and symptoms of potential                            delayed complications were discussed with the                            patient. Return to normal activities tomorrow.                            Written discharge instructions were provided to the                            patient.                           - Resume previous diet.                           -  Continue present medications. Docia Chuck. Henrene Pastor, MD 08/12/2016 3:04:25 PM This report has been signed electronically.

## 2016-08-12 NOTE — Patient Instructions (Signed)
YOU HAD AN ENDOSCOPIC PROCEDURE TODAY AT THE [facility]:   Refer to the procedure report that was given to you for any specific questions about what was found during the examination.  If the procedure report does not answer your questions, please call your gastroenterologist to clarify.  If you requested that your care partner not be given the details of your procedure findings, then the procedure report has been included in a sealed envelope for you to review at your convenience later.  YOU SHOULD EXPECT: Some feelings of bloating in the abdomen. Passage of more gas than usual.  Walking can help get rid of the air that was put into your GI tract during the procedure and reduce the bloating. If you had a lower endoscopy (such as a colonoscopy or flexible sigmoidoscopy) you may notice spotting of blood in your stool or on the toilet paper. If you underwent a bowel prep for your procedure, you may not have a normal bowel movement for a few days.  Please Note:  You might notice some irritation and congestion in your nose or some drainage.  This is from the oxygen used during your procedure.  There is no need for concern and it should clear up in a day or so.  SYMPTOMS TO REPORT IMMEDIATELY:   Following lower endoscopy (colonoscopy or flexible sigmoidoscopy):  Excessive amounts of blood in the stool  Significant tenderness or worsening of abdominal pains  Swelling of the abdomen that is new, acute  Fever of 100F or higher   For urgent or emergent issues, a gastroenterologist can be reached at any hour by calling [phone].   DIET:  We do recommend a small meal at first, but then you may proceed to your regular diet.  Drink plenty of fluids but you should avoid alcoholic beverages for 24 hours.  Try to increase the fiber in your diet, and drink plenty of water.  ACTIVITY:  You should plan to take it easy for the rest of today and you should NOT DRIVE or use heavy machinery until  tomorrow (because of the sedation medicines used during the test).    FOLLOW UP: Our staff will call the number listed on your records the next business day following your procedure to check on you and address any questions or concerns that you may have regarding the information given to you following your procedure. If we do not reach you, we will leave a message.  However, if you are feeling well and you are not experiencing any problems, there is no need to return our call.  We will assume that you have returned to your regular daily activities without incident.  If any biopsies were taken you will be contacted by phone or by letter within the next 1-3 weeks.  Please call us at [phone] if you have not heard about the biopsies in 3 weeks.    SIGNATURES/CONFIDENTIALITY: You and/or your care partner have signed paperwork which will be entered into your electronic medical record.  These signatures attest to the fact that that the information above on your After Visit Summary has been reviewed and is understood.  Full responsibility of the confidentiality of this discharge information lies with you and/or your care-partner. 

## 2016-08-12 NOTE — Progress Notes (Signed)
Alert and oriented x3, pleased with MAC, report to RN

## 2016-08-12 NOTE — Progress Notes (Signed)
Called to room to assist during endoscopic procedure.  Patient ID and intended procedure confirmed with present staff. Received instructions for my participation in the procedure from the performing physician.  

## 2016-08-12 NOTE — Progress Notes (Signed)
Pt's states no medical or surgical changes since previsit or office visit. 

## 2016-08-15 ENCOUNTER — Telehealth: Payer: Self-pay | Admitting: *Deleted

## 2016-08-15 NOTE — Telephone Encounter (Signed)
No answer. Name identifier. Message left to call if questions or concerns and that we will make an attempt to call later in the day.

## 2016-08-15 NOTE — Telephone Encounter (Signed)
  Follow up Call-  Call back number 08/12/2016  Post procedure Call Back phone  # [phone]  Permission to leave phone message Yes  Some recent data might be hidden     Patient questions:  Do you have a fever, pain , or abdominal swelling? No. Pain Score  0 *  Have you tolerated food without any problems? Yes  Have you been able to return to your normal activities? Yes  Do you have any questions about your discharge instructions: Diet   No. Medications  No. Follow up visit  No.  Do you have questions or concerns about your Care? No.  Actions: * If pain score is 4 or above: No action needed, pain <4.

## 2016-08-16 ENCOUNTER — Encounter: Payer: Self-pay | Admitting: Internal Medicine

## 2016-09-12 NOTE — Progress Notes (Signed)
GYNECOLOGY  VISIT   HPI: 76 y.o.   Widowed  Caucasian  female   G2P2002 with Patient's last menstrual period was 01/17/1982 (approximate).   here for pessary check.    Now using a #4 ring with support. It is working better than the #3. Pessary not painful.  No bleeding.  Good bladder and bowel function.  Has some frequent urination. Wearing a pad.  No leakage of urine with laugh or cough.  Does not remove the pessary on her own.   Using Triosam for hydration.   Starting [employer] program at the [facility].   GYNECOLOGIC HISTORY: Patient's last menstrual period was 01/17/1982 (approximate). Contraception: Postmenopausal Menopausal hormone therapy:  none Last mammogram: 12-29-15 3D/Hx Rt.lumpectomy/Density C/Neg/BiRads2:TBC Last pap smear:  02-24-15 Neg, 02-13-12 Ne        OB History    Gravida Para Term Preterm AB Living   2 2 2  0 0 2   SAB TAB Ectopic Multiple Live Births   0 0 0 0 2         Patient Active Problem List   Diagnosis Date Noted  . Monoallelic mutation of NBN gene   . Genetic testing 01/13/2015  . Family history of breast cancer   . Family history of colon cancer   . Breast cancer of lower-inner quadrant of right female breast ([place]) 12/30/2014    Past Medical History:  Diagnosis Date  . Breast cancer ([place]) 11/2014  . Family history of breast cancer   . Family history of colon cancer   . History of radiation therapy 08/11/15- 09/23/2015   Right Breast  . Hyperlipidemia   . Hypertension   . Kidney stone on left side   . Monoallelic mutation of NBN gene   . Osteoporosis   . Skin cancer of nose     Past Surgical History:  Procedure Laterality Date  . APPENDECTOMY    . BREAST LUMPECTOMY WITH RADIOACTIVE SEED AND SENTINEL LYMPH NODE BIOPSY Right 02/03/2015   Procedure: RIGHT BREAST LUMPECTOMY WITH RADIOACTIVE SEED AND RIGHT SENTINEL LYMPH NODE BIOPSY;  Surgeon: [name], MD;  Location: [facility];  Service: General;  Laterality: Right;   . BREAST SURGERY  11/2012   biopsy, benign breast tissue  . COLONOSCOPY  06/2010   rec  . LITHOTRIPSY     kidney stone on left side  . OVARIAN CYST REMOVAL  age 26  . POLYPECTOMY  04/2005   colon  . PORTACATH PLACEMENT Left 02/03/2015   Procedure: INSERTION PORT-A-CATH WITH ULTRA SOUND GUIDANCE;  Surgeon: [name], MD;  Location: [facility];  Service: General;  Laterality: Left;    Current Outpatient Prescriptions  Medication Sig Dispense Refill  . B Complex-Biotin-FA (SUPER B-50 COMPLEX) CAPS Take 1 capsule by mouth daily.    . CRESTOR 20 MG tablet Take 1 tablet by mouth daily.  0  . hydrochlorothiazide (MICROZIDE) 12.5 MG capsule Take 12.5 mg by mouth every other day.     . losartan (COZAAR) 100 MG tablet Take 100 mg by mouth daily.  0  . OVER THE COUNTER MEDICATION 1,000 mg daily.    . vitamin E 100 UNIT capsule Take 1 capsule (100 Units total) by mouth daily.    . Vitamin D, Ergocalciferol, (DRISDOL) 50000 units CAPS capsule Take 50,000 Units by mouth 2 (two) times a week. Monday and Thursday     Current Facility-Administered Medications  Medication Dose Route Frequency Provider Last Rate Last Dose  . 0.9 %  sodium chloride infusion  500 mL Intravenous Continuous Irene Shipper, MD         ALLERGIES: Patient has no known allergies.  Family History  Problem Relation Age of Onset  . Colon cancer Sister 57       died at 51  . Breast cancer Mother 61  . Hypertension Mother   . Osteoporosis Mother   . Heart disease Father        CABG  . Dementia Father   . Throat cancer Brother 31  . Breast cancer Sister 52  . Lymphoma Sister 2  . Melanoma Sister   . Skin cancer Brother   . Skin cancer Daughter     Social History   Social History  . Marital status: Widowed    Spouse name: N/A  . Number of children: 2  . Years of education: N/A   Occupational History  . Not on file.   Social History Main Topics  . Smoking status: Never Smoker  . Smokeless  tobacco: Never Used  . Alcohol use 4.2 oz/week    7 Glasses of wine per week     Comment: 6-7 glasses of wine a week  . Drug use: No  . Sexual activity: No   Other Topics Concern  . Not on file   Social History Narrative  . No narrative on file    ROS:  Pertinent items are noted in HPI.  PHYSICAL EXAMINATION:    BP 112/70 (BP Location: Right Arm, Patient Position: Sitting, Cuff Size: Normal)   Pulse 76   Resp 16   Wt 137 lb (62.1 kg)   LMP 01/17/1982 (Approximate)   BMI 20.53 kg/m     General appearance: alert, cooperative and appears stated age    Pelvic: External genitalia:  no lesions              Urethra:  normal appearing urethra with no masses, tenderness or lesions              Bartholins and Skenes: normal                 Vagina: normal appearing vagina with normal color and discharge, no lesions              Cervix: no lesions                Bimanual Exam:  Uterus:  normal size, contour, position, consistency, mobility, non-tender              Adnexa: no mass, fullness, tenderness                Pessary ring with support removed, cleansed, and replaced using Surgilube.  Chaperone was present for exam.  ASSESSMENT  Incomplete uterovaginal prolapse.  Doing well with pessary.   PLAN  Continue pessary care.  Use Biking shorts and absorbant protection for exercise class! OK to follow up every 3 months.  Annual exam in Feb. 2019.    An After Visit Summary was printed and given to the patient.  ___15___ minutes face to face time of which over 50% was spent in counseling.

## 2016-09-14 ENCOUNTER — Ambulatory Visit (INDEPENDENT_AMBULATORY_CARE_PROVIDER_SITE_OTHER): Payer: Medicare Other | Admitting: Obstetrics and Gynecology

## 2016-09-14 ENCOUNTER — Encounter: Payer: Self-pay | Admitting: Obstetrics and Gynecology

## 2016-09-14 ENCOUNTER — Ambulatory Visit: Payer: Medicare Other | Admitting: Nurse Practitioner

## 2016-09-14 VITALS — BP 112/70 | HR 76 | Resp 16 | Wt 137.0 lb

## 2016-09-14 DIAGNOSIS — N812 Incomplete uterovaginal prolapse: Secondary | ICD-10-CM

## 2016-10-03 ENCOUNTER — Encounter: Payer: Self-pay | Admitting: Podiatry

## 2016-10-03 ENCOUNTER — Ambulatory Visit (INDEPENDENT_AMBULATORY_CARE_PROVIDER_SITE_OTHER): Payer: Medicare Other | Admitting: Podiatry

## 2016-10-03 DIAGNOSIS — L84 Corns and callosities: Secondary | ICD-10-CM | POA: Diagnosis not present

## 2016-10-03 DIAGNOSIS — M216X2 Other acquired deformities of left foot: Secondary | ICD-10-CM

## 2016-10-03 NOTE — Progress Notes (Signed)
Patient ID: Shawna [name], female   DOB: 09-01-1940, 76 y.o.   MRN: [id number]   Subjective: This patient presents today primarily concerned about painful callouses in the plantar aspect left foot show cough walking wearing shoes. This patient was last evaluated for a similar problem on 01/06/2015. Systems patient has had chemotherapy secondary to breast cancer in complains of some numbness in a stretching sensation in her feet. The symptoms occur primarily with weightbearing patient states that the gabapentin prescribed did not change the ceiling. She denies having difficulty staying and falling asleep or doing physical activity  Objective: Orientated 3 DP and PT pulses 2/4 bilaterally Capillary reflex within normal limits bilaterally Sensation to 10 g monofilament wire intact 8/8 bilaterally Vibratory sensation reactive bilaterally Ankle reflexes reactive bilaterally No open skin lesions bilaterally Plantar calluses subsecond MPJ right and subsecond MPJ left, third MPJ Prominent metatarsal heads 1 through 5 bilaterally Manual motor testing dorsi flexion, plantar flexion, inversion, eversion 5/5 bilaterally  Assessment: Plantar calluses bilaterally Mild peripheral neuropathy associated with chemotherapy  Plan: Debrided plantar calluses right and left Discussed patient's symptoms of neuropathy. At this time because patient's symptoms are minimal I'm not recommending any active treatment  Reappoint at patient's request

## 2016-10-31 DIAGNOSIS — I10 Essential (primary) hypertension: Secondary | ICD-10-CM | POA: Diagnosis not present

## 2016-10-31 DIAGNOSIS — M810 Age-related osteoporosis without current pathological fracture: Secondary | ICD-10-CM | POA: Diagnosis not present

## 2016-11-07 DIAGNOSIS — L84 Corns and callosities: Secondary | ICD-10-CM | POA: Diagnosis not present

## 2016-11-07 DIAGNOSIS — G6289 Other specified polyneuropathies: Secondary | ICD-10-CM | POA: Diagnosis not present

## 2016-11-07 DIAGNOSIS — N3281 Overactive bladder: Secondary | ICD-10-CM | POA: Diagnosis not present

## 2016-11-07 DIAGNOSIS — Z0000 Encounter for general adult medical examination without abnormal findings: Secondary | ICD-10-CM | POA: Diagnosis not present

## 2016-11-21 DIAGNOSIS — Z1212 Encounter for screening for malignant neoplasm of rectum: Secondary | ICD-10-CM | POA: Diagnosis not present

## 2016-12-13 ENCOUNTER — Telehealth: Payer: Self-pay | Admitting: Obstetrics and Gynecology

## 2016-12-13 NOTE — Telephone Encounter (Addendum)
Left message regarding upcoming appointment 12/15/16  for 3 month pessary check has been canceled and needs to be rescheduled.

## 2016-12-15 ENCOUNTER — Ambulatory Visit: Payer: Medicare Other | Admitting: Obstetrics and Gynecology

## 2016-12-20 ENCOUNTER — Encounter: Payer: Self-pay | Admitting: Obstetrics and Gynecology

## 2016-12-20 ENCOUNTER — Other Ambulatory Visit: Payer: Self-pay

## 2016-12-20 ENCOUNTER — Ambulatory Visit (INDEPENDENT_AMBULATORY_CARE_PROVIDER_SITE_OTHER): Payer: Medicare Other | Admitting: Obstetrics and Gynecology

## 2016-12-20 VITALS — BP 130/78 | HR 84 | Resp 14 | Wt 141.0 lb

## 2016-12-20 DIAGNOSIS — N3941 Urge incontinence: Secondary | ICD-10-CM

## 2016-12-20 DIAGNOSIS — Z4689 Encounter for fitting and adjustment of other specified devices: Secondary | ICD-10-CM

## 2016-12-20 DIAGNOSIS — N819 Female genital prolapse, unspecified: Secondary | ICD-10-CM

## 2016-12-20 NOTE — Patient Instructions (Signed)

## 2016-12-20 NOTE — Progress Notes (Signed)
GYNECOLOGY  VISIT   HPI: 76 y.o.   Widowed  Caucasian  female   G2P2002 with Patient's last menstrual period was 01/17/1982 (approximate).   here for pessary check. She has a #4 ring pessary with support. She is comfortable. No abnormal discharge, no bleeding. She has some urge incontinence, wears a pad when she is out. Leaks a small amount a couple of times a day. When she is up and about she has slower urinary stream, some dribbling. When she has been lying down and then gets up to void, she has a normal stream. She feels she is emptying her bladder. No GSI. Prior to the pessary she was leaking more.  She drinks one cup of coffee in the am (10 oz). She has a h/o breast cancer, 11/16. S/P lumpectomy, chemotherapy, radiation. Due for a f/u mammogram.   GYNECOLOGIC HISTORY: Patient's last menstrual period was 01/17/1982 (approximate). Contraception:postmenopause  Menopausal hormone therapy: none         OB History    Gravida Para Term Preterm AB Living   2 2 2  0 0 2   SAB TAB Ectopic Multiple Live Births   0 0 0 0 2         Patient Active Problem List   Diagnosis Date Noted  . Monoallelic mutation of NBN gene   . Genetic testing 01/13/2015  . Family history of breast cancer   . Family history of colon cancer   . Breast cancer of lower-inner quadrant of right female breast ([facility]) 12/30/2014    Past Medical History:  Diagnosis Date  . Breast cancer ([facility]) 11/2014  . Family history of breast cancer   . Family history of colon cancer   . History of radiation therapy 08/11/15- 09/23/2015   Right Breast  . Hyperlipidemia   . Hypertension   . Kidney stone on left side   . Monoallelic mutation of NBN gene   . Osteoporosis   . Skin cancer of nose     Past Surgical History:  Procedure Laterality Date  . APPENDECTOMY    . BREAST LUMPECTOMY WITH RADIOACTIVE SEED AND SENTINEL LYMPH NODE BIOPSY Right 02/03/2015   Procedure: RIGHT BREAST LUMPECTOMY WITH RADIOACTIVE SEED AND RIGHT  SENTINEL LYMPH NODE BIOPSY;  Surgeon: [name], MD;  Location: [facility];  Service: General;  Laterality: Right;  . BREAST SURGERY  11/2012   biopsy, benign breast tissue  . COLONOSCOPY  06/2010   rec  . LITHOTRIPSY     kidney stone on left side  . OVARIAN CYST REMOVAL  age 74  . POLYPECTOMY  04/2005   colon  . PORTACATH PLACEMENT Left 02/03/2015   Procedure: INSERTION PORT-A-CATH WITH ULTRA SOUND GUIDANCE;  Surgeon: [name], MD;  Location: [facility];  Service: General;  Laterality: Left;    Current Outpatient Medications  Medication Sig Dispense Refill  . B Complex-Biotin-FA (SUPER B-50 COMPLEX) CAPS Take 1 capsule by mouth daily.    . CRESTOR 20 MG tablet Take 1 tablet by mouth daily.  0  . hydrochlorothiazide (MICROZIDE) 12.5 MG capsule Take 12.5 mg by mouth every other day.     . losartan (COZAAR) 100 MG tablet Take 100 mg by mouth daily.  0  . Vitamin D, Ergocalciferol, (DRISDOL) 50000 units CAPS capsule Take 50,000 Units by mouth 2 (two) times a week. Monday and Thursday    . vitamin E 100 UNIT capsule Take 1 capsule (100 Units total) by mouth daily.  Current Facility-Administered Medications  Medication Dose Route Frequency Provider Last Rate Last Dose  . 0.9 %  sodium chloride infusion  500 mL Intravenous Continuous Irene Shipper, MD         ALLERGIES: Patient has no known allergies.  Family History  Problem Relation Age of Onset  . Colon cancer Sister 81       died at 11  . Breast cancer Mother 34  . Hypertension Mother   . Osteoporosis Mother   . Heart disease Father        CABG  . Dementia Father   . Throat cancer Brother 48  . Breast cancer Sister 38  . Lymphoma Sister 37  . Melanoma Sister   . Skin cancer Brother   . Skin cancer Daughter     Social History   Socioeconomic History  . Marital status: Widowed    Spouse name: Not on file  . Number of children: 2  . Years of education: Not on file  . Highest  education level: Not on file  Social Needs  . Financial resource strain: Not on file  . Food insecurity - worry: Not on file  . Food insecurity - inability: Not on file  . Transportation needs - medical: Not on file  . Transportation needs - non-medical: Not on file  Occupational History  . Not on file  Tobacco Use  . Smoking status: Never Smoker  . Smokeless tobacco: Never Used  Substance and Sexual Activity  . Alcohol use: Yes    Alcohol/week: 4.2 oz    Types: 7 Glasses of wine per week    Comment: 6-7 glasses of wine a week  . Drug use: No  . Sexual activity: No    Partners: Male    Birth control/protection: Post-menopausal  Other Topics Concern  . Not on file  Social History Narrative  . Not on file    Review of Systems  Constitutional: Negative.   HENT: Negative.   Eyes: Negative.   Respiratory: Negative.   Cardiovascular: Negative.   Gastrointestinal: Negative.   Genitourinary: Negative.   Musculoskeletal: Negative.   Skin: Negative.   Neurological: Negative.   Endo/Heme/Allergies: Negative.   Psychiatric/Behavioral: Negative.     PHYSICAL EXAMINATION:    BP 130/78 (BP Location: Right Arm, Patient Position: Sitting, Cuff Size: Normal)   Pulse 84   Resp 14   Wt 141 lb (64 kg)   LMP 01/17/1982 (Approximate)   BMI 21.13 kg/m     General appearance: alert, cooperative and appears stated age  Pelvic: External genitalia:  no lesions              Urethra:  normal appearing urethra with no masses, tenderness or lesions              Bartholins and Skenes: normal                 Vagina: the pessary was removed and cleaned, no vaginal irritation  Fitted with a #5 ring pessary with support              Cervix: no lesions  Chaperone was present for exam.  ASSESSMENT Genital prolapse, controlled with the pessary, but she is having some issues with her urinary stream when she is up and on her feet for a while Urge incontinence (better with the pessary)     PLAN Fitted with a #5 ring pessary with support F/U with Dr Quincy Simmonds in one week Discussed the option  of PT or medication for her urge incontinence [place] information given   An After Visit Summary was printed and given to the patient.  ~30 minutes face to face time of which over 50% was spent in counseling.   CC: Dr Quincy Simmonds

## 2016-12-28 ENCOUNTER — Other Ambulatory Visit: Payer: Self-pay

## 2016-12-28 ENCOUNTER — Encounter: Payer: Self-pay | Admitting: Obstetrics and Gynecology

## 2016-12-28 ENCOUNTER — Ambulatory Visit (INDEPENDENT_AMBULATORY_CARE_PROVIDER_SITE_OTHER): Payer: Medicare Other | Admitting: Obstetrics and Gynecology

## 2016-12-28 VITALS — BP 128/80 | HR 72 | Resp 14 | Wt 141.0 lb

## 2016-12-28 DIAGNOSIS — Z4689 Encounter for fitting and adjustment of other specified devices: Secondary | ICD-10-CM | POA: Diagnosis not present

## 2016-12-28 NOTE — Progress Notes (Signed)
GYNECOLOGY  VISIT   HPI: 76 y.o.   Widowed  Caucasian  female   G2P2002 with Patient's last menstrual period was 01/17/1982 (approximate).   here for pessary follow up. She was refitted with a #5 ring pessary with support a few weeks ago. This one is working better. Voiding is much better, leaking is better.   Son is local, daughter is here from [place] (helping to take care of her sick Father). The patient is close with her stepchildren (second husband died 26 years ago) and step grandchildren, they are local.   GYNECOLOGIC HISTORY: Patient's last menstrual period was 01/17/1982 (approximate). Contraception:postmenopause  Menopausal hormone therapy: none         OB History    Gravida Para Term Preterm AB Living   2 2 2  0 0 2   SAB TAB Ectopic Multiple Live Births   0 0 0 0 2         Patient Active Problem List   Diagnosis Date Noted  . Monoallelic mutation of NBN gene   . Genetic testing 01/13/2015  . Family history of breast cancer   . Family history of colon cancer   . Breast cancer of lower-inner quadrant of right female breast ([place]) 12/30/2014    Past Medical History:  Diagnosis Date  . Breast cancer ([place]) 11/2014  . Family history of breast cancer   . Family history of colon cancer   . History of radiation therapy 08/11/15- 09/23/2015   Right Breast  . Hyperlipidemia   . Hypertension   . Kidney stone on left side   . Monoallelic mutation of NBN gene   . Osteoporosis   . Skin cancer of nose     Past Surgical History:  Procedure Laterality Date  . APPENDECTOMY    . BREAST LUMPECTOMY WITH RADIOACTIVE SEED AND SENTINEL LYMPH NODE BIOPSY Right 02/03/2015   Procedure: RIGHT BREAST LUMPECTOMY WITH RADIOACTIVE SEED AND RIGHT SENTINEL LYMPH NODE BIOPSY;  Surgeon: [name], MD;  Location: [place];  Service: General;  Laterality: Right;  . BREAST SURGERY  11/2012   biopsy, benign breast tissue  . COLONOSCOPY  06/2010   rec  . LITHOTRIPSY     kidney  stone on left side  . OVARIAN CYST REMOVAL  age 89  . POLYPECTOMY  04/2005   colon  . PORTACATH PLACEMENT Left 02/03/2015   Procedure: INSERTION PORT-A-CATH WITH ULTRA SOUND GUIDANCE;  Surgeon: [name], MD;  Location: [employer] [facility];  Service: General;  Laterality: Left;    Current Outpatient Medications  Medication Sig Dispense Refill  . B Complex-Biotin-FA (SUPER B-50 COMPLEX) CAPS Take 1 capsule by mouth daily.    . CRESTOR 20 MG tablet Take 1 tablet by mouth daily.  0  . hydrochlorothiazide (MICROZIDE) 12.5 MG capsule Take 12.5 mg by mouth every other day.     . losartan (COZAAR) 100 MG tablet Take 100 mg by mouth daily.  0  . Vitamin D, Ergocalciferol, (DRISDOL) 50000 units CAPS capsule Take 50,000 Units by mouth 2 (two) times a week. Monday and Thursday    . vitamin E 100 UNIT capsule Take 1 capsule (100 Units total) by mouth daily.     Current Facility-Administered Medications  Medication Dose Route Frequency Provider Last Rate Last Dose  . 0.9 %  sodium chloride infusion  500 mL Intravenous Continuous Irene Shipper, MD         ALLERGIES: Patient has no known allergies.  Family History  Problem Relation Age of Onset  . Colon cancer Sister 31       died at 68  . Breast cancer Mother 50  . Hypertension Mother   . Osteoporosis Mother   . Heart disease Father        CABG  . Dementia Father   . Throat cancer Brother 32  . Breast cancer Sister 79  . Lymphoma Sister 29  . Melanoma Sister   . Skin cancer Brother   . Skin cancer Daughter     Social History   Socioeconomic History  . Marital status: Widowed    Spouse name: Not on file  . Number of children: 2  . Years of education: Not on file  . Highest education level: Not on file  Social Needs  . Financial resource strain: Not on file  . Food insecurity - worry: Not on file  . Food insecurity - inability: Not on file  . Transportation needs - medical: Not on file  . Transportation needs -  non-medical: Not on file  Occupational History  . Not on file  Tobacco Use  . Smoking status: Never Smoker  . Smokeless tobacco: Never Used  Substance and Sexual Activity  . Alcohol use: Yes    Alcohol/week: 4.2 oz    Types: 7 Glasses of wine per week    Comment: 6-7 glasses of wine a week  . Drug use: No  . Sexual activity: No    Partners: Male    Birth control/protection: Post-menopausal  Other Topics Concern  . Not on file  Social History Narrative  . Not on file    Review of Systems  Constitutional: Negative.   HENT: Negative.   Eyes: Negative.   Respiratory: Negative.   Cardiovascular: Negative.   Gastrointestinal: Negative.   Genitourinary: Negative.   Musculoskeletal: Negative.   Skin: Negative.   Neurological: Negative.   Endo/Heme/Allergies: Negative.   Psychiatric/Behavioral: Negative.     PHYSICAL EXAMINATION:    BP 128/80 (BP Location: Right Arm, Patient Position: Sitting, Cuff Size: Normal)   Pulse 72   Resp 14   Wt 141 lb (64 kg)   LMP 01/17/1982 (Approximate)   BMI 21.13 kg/m     General appearance: alert, cooperative and appears stated age  Pelvic: External genitalia:  no lesions              Urethra:  normal appearing urethra with no masses, tenderness or lesions              Bartholins and Skenes: normal                 Vagina: the pessary was removed and cleaned. Normal appearing vagina with normal color and discharge, no lesions or erythema. Pessary replaced.               Cervix:no lesion  Chaperone was present for exam.  ASSESSMENT Pessary check, doing well with the new pessary    PLAN F/U in one month She will increase trimosan cream usage to one x a week.   An After Visit Summary was printed and given to the patient.

## 2016-12-29 DIAGNOSIS — R922 Inconclusive mammogram: Secondary | ICD-10-CM | POA: Diagnosis not present

## 2016-12-29 DIAGNOSIS — Z853 Personal history of malignant neoplasm of breast: Secondary | ICD-10-CM | POA: Diagnosis not present

## 2017-01-06 ENCOUNTER — Encounter: Payer: Self-pay | Admitting: Obstetrics and Gynecology

## 2017-01-19 ENCOUNTER — Encounter: Payer: Self-pay | Admitting: Podiatry

## 2017-01-19 ENCOUNTER — Other Ambulatory Visit: Payer: Self-pay | Admitting: Podiatry

## 2017-01-19 ENCOUNTER — Ambulatory Visit (INDEPENDENT_AMBULATORY_CARE_PROVIDER_SITE_OTHER): Payer: Medicare Other

## 2017-01-19 ENCOUNTER — Ambulatory Visit: Payer: Medicare Other | Admitting: Podiatry

## 2017-01-19 DIAGNOSIS — M779 Enthesopathy, unspecified: Secondary | ICD-10-CM

## 2017-01-19 DIAGNOSIS — M79672 Pain in left foot: Secondary | ICD-10-CM

## 2017-01-19 DIAGNOSIS — L84 Corns and callosities: Secondary | ICD-10-CM

## 2017-01-24 ENCOUNTER — Other Ambulatory Visit: Payer: Medicare Other | Admitting: Orthotics

## 2017-01-25 ENCOUNTER — Encounter: Payer: Self-pay | Admitting: Obstetrics and Gynecology

## 2017-01-25 ENCOUNTER — Ambulatory Visit: Payer: Medicare Other | Admitting: Obstetrics and Gynecology

## 2017-01-25 ENCOUNTER — Other Ambulatory Visit: Payer: Self-pay

## 2017-01-25 VITALS — BP 118/68 | HR 80 | Resp 16 | Wt 140.0 lb

## 2017-01-25 DIAGNOSIS — N649 Disorder of breast, unspecified: Secondary | ICD-10-CM

## 2017-01-25 DIAGNOSIS — L988 Other specified disorders of the skin and subcutaneous tissue: Secondary | ICD-10-CM

## 2017-01-25 DIAGNOSIS — Z4689 Encounter for fitting and adjustment of other specified devices: Secondary | ICD-10-CM

## 2017-01-25 NOTE — Progress Notes (Signed)
GYNECOLOGY  VISIT   HPI: 77 y.o.   Widowed  Caucasian  female   G2P2002 with Patient's last menstrual period was 01/17/1982 (approximate).   here for pessary check, she has a #5 ring pessary with support. She uses trimosan cream about one time a week. Voiding well, no bowel c/o. No vaginal bleeding.   GYNECOLOGIC HISTORY: Patient's last menstrual period was 01/17/1982 (approximate). Contraception: Postmenopausal Menopausal hormone therapy: none        OB History    Gravida Para Term Preterm AB Living   2 2 2  0 0 2   SAB TAB Ectopic Multiple Live Births   0 0 0 0 2         Patient Active Problem List   Diagnosis Date Noted  . Monoallelic mutation of NBN gene   . Genetic testing 01/13/2015  . Family history of breast cancer   . Family history of colon cancer   . Breast cancer of lower-inner quadrant of right female breast ([place]) 12/30/2014    Past Medical History:  Diagnosis Date  . Breast cancer ([place]) 11/2014  . Family history of breast cancer   . Family history of colon cancer   . History of radiation therapy 08/11/15- 09/23/2015   Right Breast  . Hyperlipidemia   . Hypertension   . Kidney stone on left side   . Monoallelic mutation of NBN gene   . Osteoporosis   . Skin cancer of nose     Past Surgical History:  Procedure Laterality Date  . APPENDECTOMY    . BREAST LUMPECTOMY WITH RADIOACTIVE SEED AND SENTINEL LYMPH NODE BIOPSY Right 02/03/2015   Procedure: RIGHT BREAST LUMPECTOMY WITH RADIOACTIVE SEED AND RIGHT SENTINEL LYMPH NODE BIOPSY;  Surgeon: [name], MD;  Location: [place];  Service: General;  Laterality: Right;  . BREAST SURGERY  11/2012   biopsy, benign breast tissue  . COLONOSCOPY  06/2010   rec  . LITHOTRIPSY     kidney stone on left side  . OVARIAN CYST REMOVAL  age 77  . POLYPECTOMY  04/2005   colon  . PORTACATH PLACEMENT Left 02/03/2015   Procedure: INSERTION PORT-A-CATH WITH ULTRA SOUND GUIDANCE;  Surgeon: [name], MD;   Location: [place];  Service: General;  Laterality: Left;    Current Outpatient Medications  Medication Sig Dispense Refill  . B Complex-Biotin-FA (SUPER B-50 COMPLEX) CAPS Take 1 capsule by mouth daily.    . CRESTOR 20 MG tablet Take 1 tablet by mouth daily.  0  . hydrochlorothiazide (MICROZIDE) 12.5 MG capsule Take 12.5 mg by mouth every other day.     . losartan (COZAAR) 100 MG tablet Take 100 mg by mouth daily.  0  . Vitamin D, Ergocalciferol, (DRISDOL) 50000 units CAPS capsule Take 50,000 Units by mouth 2 (two) times a week. Monday and Thursday    . vitamin E 100 UNIT capsule Take 1 capsule (100 Units total) by mouth daily.     Current Facility-Administered Medications  Medication Dose Route Frequency Provider Last Rate Last Dose  . 0.9 %  sodium chloride infusion  500 mL Intravenous Continuous Irene Shipper, MD         ALLERGIES: Patient has no known allergies.  Family History  Problem Relation Age of Onset  . Colon cancer Sister 85       died at 33  . Breast cancer Mother 20  . Hypertension Mother   . Osteoporosis Mother   . Heart  disease Father        CABG  . Dementia Father   . Throat cancer Brother 13  . Breast cancer Sister 63  . Lymphoma Sister 54  . Melanoma Sister   . Skin cancer Brother   . Skin cancer Daughter     Social History   Socioeconomic History  . Marital status: Widowed    Spouse name: Not on file  . Number of children: 2  . Years of education: Not on file  . Highest education level: Not on file  Social Needs  . Financial resource strain: Not on file  . Food insecurity - worry: Not on file  . Food insecurity - inability: Not on file  . Transportation needs - medical: Not on file  . Transportation needs - non-medical: Not on file  Occupational History  . Not on file  Tobacco Use  . Smoking status: Never Smoker  . Smokeless tobacco: Never Used  Substance and Sexual Activity  . Alcohol use: Yes    Alcohol/week: 4.2 oz     Types: 7 Glasses of wine per week    Comment: 6-7 glasses of wine a week  . Drug use: No  . Sexual activity: No    Partners: Male    Birth control/protection: Post-menopausal  Other Topics Concern  . Not on file  Social History Narrative  . Not on file    Review of Systems  Constitutional: Negative.   HENT: Negative.   Eyes: Negative.   Respiratory: Negative.   Cardiovascular: Negative.   Gastrointestinal: Negative.   Genitourinary: Negative.   Musculoskeletal: Negative.   Skin: Negative.   Neurological: Negative.   Endo/Heme/Allergies: Negative.   Psychiatric/Behavioral: Negative.     PHYSICAL EXAMINATION:    BP 118/68 (BP Location: Right Arm, Patient Position: Sitting, Cuff Size: Normal)   Pulse 80   Resp 16   Wt 140 lb (63.5 kg)   LMP 01/17/1982 (Approximate)   BMI 20.98 kg/m     General appearance: alert, cooperative and appears stated age Breasts: evidence of right lumpectomy, just medial and below the lumpectomy site is a 1 cm raised, irregular red lesion, no lumps. Normal left breast exam  Pelvic: External genitalia:  no lesions              Urethra:  normal appearing urethra with no masses, tenderness or lesions              Bartholins and Skenes: normal                 Vagina:the pessary was removed and cleaned, no vaginal irritation noted, the pessary was reinserted.              Cervix: no lesions                Chaperone was present for exam.  ASSESSMENT Pessary check, doing well Lesion on her right breast, near her lumpectomy site    PLAN F/U pessary check in 3 months with her annual exam She has an appointment to see dermatology next week, if he doesn't biopsy the lesion I've recommended she f/u with her Oncologist She has had normal breast imaging last month, this lesion was present at that time   An After Visit Summary was printed and given to the patient.  ~15 minutes face to face time of which over 50% was spent in counseling.

## 2017-02-01 DIAGNOSIS — Z85828 Personal history of other malignant neoplasm of skin: Secondary | ICD-10-CM | POA: Diagnosis not present

## 2017-02-01 DIAGNOSIS — D2262 Melanocytic nevi of left upper limb, including shoulder: Secondary | ICD-10-CM | POA: Diagnosis not present

## 2017-02-01 DIAGNOSIS — D225 Melanocytic nevi of trunk: Secondary | ICD-10-CM | POA: Diagnosis not present

## 2017-02-01 DIAGNOSIS — D2261 Melanocytic nevi of right upper limb, including shoulder: Secondary | ICD-10-CM | POA: Diagnosis not present

## 2017-02-01 DIAGNOSIS — C44599 Other specified malignant neoplasm of skin of other part of trunk: Secondary | ICD-10-CM | POA: Diagnosis not present

## 2017-02-03 ENCOUNTER — Telehealth: Payer: Self-pay

## 2017-02-03 NOTE — Telephone Encounter (Signed)
Returned pt call regarding a pathology report from her Dermatologist. Informed her we have not yet received that report. Pt gave me that office information. I called the office and left a detailed message regarding this. Fax and direct line was given. Informed pt once we received the path report we would call pt back.  Cyndia Bent RN

## 2017-02-06 ENCOUNTER — Telehealth: Payer: Self-pay | Admitting: Hematology and Oncology

## 2017-02-06 ENCOUNTER — Inpatient Hospital Stay: Payer: Medicare Other | Attending: Hematology and Oncology | Admitting: Hematology and Oncology

## 2017-02-06 ENCOUNTER — Telehealth: Payer: Self-pay

## 2017-02-06 VITALS — BP 140/77 | HR 76 | Temp 97.3°F | Resp 20 | Ht 68.5 in | Wt 140.1 lb

## 2017-02-06 DIAGNOSIS — Z171 Estrogen receptor negative status [ER-]: Secondary | ICD-10-CM | POA: Insufficient documentation

## 2017-02-06 DIAGNOSIS — C50311 Malignant neoplasm of lower-inner quadrant of right female breast: Secondary | ICD-10-CM | POA: Insufficient documentation

## 2017-02-06 NOTE — Progress Notes (Signed)
Patient Care Team: [name], MD as PCP - General (Internal Medicine) [name], MD as Consulting Physician (General Surgery) [name], MD as Consulting Physician (Hematology and Oncology) Delice Bison Charlestine Massed, NP as Nurse Practitioner (Hematology and Oncology)  DIAGNOSIS:  Encounter Diagnosis  Name Primary?  . 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 Genes tested include:  ATM, BARD1, BRCA1, BRCA2, BRIP1, CDH1, CHEK2, EPCAM, FANCC, MLH1, MSH2, MSH6, NBN, PALB2, PMS2, PTEN, RAD51C, RAD51D, TP53, and XRCC2.      02/03/2015 Surgery    Right lumpectomy: Invasive ductal carcinoma grade 2, 2.1 cm, with associated DCIS, margins are negative, 0/1 lymph node, ER 0%, PR 0%, HER-2 positive ratio 2.31, T2 N0 stage II a      03/09/2015 - 06/22/2015 Chemotherapy    Adjuvant chemotherapy with [place] 6 followed by Herceptin maintenance for 1 year      08/11/2015 - 09/23/2015 Radiation Therapy    Adjuvant radiation therapy [name]):  1) Right Breast / 50 Gy in 25 fractions ; 2) Right Breast Boost / 10 Gy in 5 fractions      01/30/2017 Relapse/Recurrence    Skin biopsy right breast: Metastatic breast cancer GCDFP strongly positive       CHIEF COMPLIANT: Follow-up to discuss the recently detected skin biopsy showing metastatic breast cancer  INTERVAL HISTORY: Shawna [name] is a 77 year old with above-mentioned history of right breast cancer  treated with lumpectomy adjuvant chemotherapy and radiation is currently in surveillance.  She noticed a small pimple that was dark discoloration.  She underwent skin excision by dermatology and the pathology revealed metastatic breast cancer.  She is here today urgently to discuss her treatment plan.  REVIEW OF SYSTEMS:   Constitutional: Denies fevers, chills or abnormal weight loss Eyes: Denies blurriness of vision Ears, nose, mouth, throat, and face: Denies mucositis or sore throat Respiratory: Denies cough, dyspnea or wheezes Cardiovascular: Denies palpitation, chest discomfort Gastrointestinal:  Denies nausea, heartburn or change in bowel habits Skin: Denies abnormal skin rashes Lymphatics: Denies new lymphadenopathy or easy bruising Neurological:Denies numbness, tingling or new weaknesses Behavioral/Psych: Mood is stable, no new changes  Extremities: No lower extremity edema Breast:  denies any pain or lumps or nodules in either breasts All other systems were reviewed with the patient and are negative.  I have reviewed the past medical history, past surgical history, social history and family history with the patient and they are unchanged from previous note.  ALLERGIES:  has No Known Allergies.  MEDICATIONS:  Current Outpatient Medications  Medication Sig Dispense Refill  . B Complex-Biotin-FA (SUPER B-50 COMPLEX) CAPS Take 1 capsule by mouth daily.    . CRESTOR 20 MG tablet Take 1 tablet by mouth daily.  0  . hydrochlorothiazide (MICROZIDE) 12.5 MG capsule Take 12.5 mg by mouth every other day.     . losartan (COZAAR) 100 MG tablet Take 100 mg by mouth daily.  0  . Vitamin D, Ergocalciferol, 2000 units CAPS Take by mouth daily.    . vitamin E 100 UNIT capsule Take 1 capsule (100 Units total) by mouth daily.     Current Facility-Administered Medications  Medication Dose Route Frequency Provider Last Rate Last Dose  . 0.9 %  sodium chloride infusion  500 mL Intravenous  Continuous Irene Shipper, MD        PHYSICAL EXAMINATION: ECOG PERFORMANCE STATUS: 1 - Symptomatic but completely ambulatory  Vitals:   02/06/17 1446  BP: 140/77  Pulse: 76  Resp: 20  Temp: (!) 97.3 F (36.3 C)  SpO2: 100%   Filed Weights   02/06/17 1446  Weight: 140 lb 1.6 oz (63.5 kg)    GENERAL:alert, no distress and comfortable SKIN: skin color, texture, turgor are normal, no rashes or significant lesions EYES: normal, Conjunctiva are pink and non-injected, sclera clear OROPHARYNX:no exudate, no erythema and lips, buccal mucosa, and tongue normal  NECK: supple, thyroid normal size, non-tender, without nodularity LYMPH:  no palpable lymphadenopathy in the cervical, axillary or inguinal LUNGS: clear to auscultation and percussion with normal breathing effort HEART: regular rate & rhythm and no murmurs and no lower extremity edema ABDOMEN:abdomen soft, non-tender and normal bowel sounds MUSCULOSKELETAL:no cyanosis of digits and no clubbing  NEURO: alert & oriented x 3 with fluent speech, no focal motor/sensory deficits EXTREMITIES: No lower extremity edema  LABORATORY DATA:  I have reviewed the data as listed CMP Latest Ref Rng & Units 01/25/2016 12/14/2015 11/02/2015  Glucose 70 - 140 mg/dl 79 93 69(L)  BUN 7.0 - 26.0 mg/dL 30.7(H) 25.7 20.5  Creatinine 0.6 - 1.1 mg/dL 1.0 0.9 0.9  Sodium 136 - 145 mEq/L 140 143 142  Potassium 3.5 - 5.1 mEq/L 3.8 4.1 3.9  Chloride 101 - 111 mmol/L - - -  CO2 22 - 29 mEq/L '24 25 25  ' Calcium 8.4 - 10.4 mg/dL 9.8 9.3 9.5  Total Protein 6.4 - 8.3 g/dL 6.5 6.3(L) 6.4  Total Bilirubin 0.20 - 1.20 mg/dL 0.69 0.66 0.53  Alkaline Phos 40 - 150 U/L 113 142 147  AST 5 - 34 U/L 33 55(H) 30  ALT 0 - 55 U/L 33 76(H) 33    Lab Results  Component Value Date   WBC 3.7 (L) 01/25/2016   HGB 12.6 01/25/2016   HCT 37.3 01/25/2016   MCV 98.7 01/25/2016   PLT 155 01/25/2016   NEUTROABS 2.8 01/25/2016    ASSESSMENT & PLAN:  Breast cancer of  lower-inner quadrant of right female breast ([place]) Right lumpectomy 02/03/2015: Invasive ductal carcinoma grade 2, 2.1 cm, with associated DCIS, margins are negative, 0/1 lymph node, ER 0%, PR 0%, HER-2 positiveratio 2.31, T2 N0 stage II a. 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 Adjuvant chemotherapy with [place] 6 cycles started 03/09/15 completed 06/22/15 tookHerceptin maintenance for 1 year until 02/15/2016 2. Followed by radiation therapy 08/11/2015 to09/05/2015 -------------------------------------------------------------------------------------------------------------------------------------------------------- Surveillance of breast cancer: 1. Mammograms December 2018: Benign breast density category C 2. cutaneous recurrence of breast cancer: Skin biopsy came back positive for metastatic breast cancer.  Recommendation: 1.  PET/CT scan 2. tumor board discussion regarding treatment options wide local excision versus mastectomy.  Patient determined that if she is to undergo mastectomy she would want to have both the breasts taken. 3.  I will request ER PR and HER-2 testing on the skin biopsy specimen. 4.  We debated whether a breast MRI would be helpful in.  We would leave it to the surgeon and tumor board to decide that. Further treatment plans based upon tumor board discussion.  I will see her back in 2 weeks to discuss her treatment plan.   I spent 25 minutes talking to the patient of which more than half was spent in counseling and coordination of care.  Orders Placed This Encounter  Procedures  . NM PET Image Restag (PS) Skull Base To Thigh    Standing Status:   Future    Standing Expiration Date:   02/06/2018    Order Specific Question:   If indicated for the ordered procedure,  I authorize the administration of a radiopharmaceutical per Radiology protocol    Answer:   Yes    Order Specific Question:   Preferred imaging location?    Answer:   [facility]    Order Specific Question:   Radiology Contrast Protocol - do NOT remove file path    Answer:   [email or web address]    Order Specific Question:   Reason for Exam additional comments    Answer:   Skin mets from breast cancer   The patient has a good understanding of the overall plan. she agrees with it. she will call with any problems that may develop before the next visit here.   Harriette Ohara, MD 02/06/17

## 2017-02-06 NOTE — Assessment & Plan Note (Signed)
Right lumpectomy 02/03/2015: Invasive ductal carcinoma grade 2, 2.1 cm, with associated DCIS, margins are negative, 0/1 lymph node, ER 0%, PR 0%, HER-2 positiveratio 2.31, T2 N0 stage II a. 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 Adjuvant chemotherapy with [place] 6 cycles started 03/09/15 completed 06/22/15 tookHerceptin maintenance for 1 year until 02/15/2016 2. Followed by radiation therapy 08/11/2015 to09/05/2015 -------------------------------------------------------------------------------------------------------------------------------------------------------- Surveillance of breast cancer: 1. Mammograms December 2018: Benign breast density category C 2. cutaneous recurrence of breast cancer: Skin biopsy came back positive for metastatic breast cancer.  Recommendation: 1.  PET/CT scan 2. tumor board discussion regarding treatment options wide local excision versus mastectomy.  Patient determined that if she is to undergo mastectomy she would want to have both the breasts taken. 3.  I will request ER PR and HER-2 testing on the skin biopsy specimen.  Further treatment plans based upon tumor board discussion.  I will see her back in 2 weeks to discuss her treatment plan.

## 2017-02-06 NOTE — Telephone Encounter (Signed)
Called pt today to let her know about her pet scan schedule coming on 02/15/17. Gave instructions that she will need to be NPO for 6hrs prior to pet scan at [facility] and to call and confirm her appt for her pet scan. LVM with call back number.

## 2017-02-06 NOTE — Telephone Encounter (Signed)
Gave patient AVs and calendar of upcoming February appointments.  °

## 2017-02-06 NOTE — Telephone Encounter (Signed)
Attempted to reach [facility] today [phone] to request pt recent pathology report to be faxed to [place] office today. LVM with call back number and fax number to our office.

## 2017-02-08 ENCOUNTER — Telehealth: Payer: Self-pay

## 2017-02-08 DIAGNOSIS — C50911 Malignant neoplasm of unspecified site of right female breast: Secondary | ICD-10-CM | POA: Diagnosis not present

## 2017-02-08 NOTE — Telephone Encounter (Signed)
Pt wanted to see if she can reschedule her 02/20/17 appt to 02/23/17 due to her daughter flying in from [place]. Told pt that Dr.Gudena will be out of the office starting Thursday 02/23/17, however, we can bring her in on wed 2/6 instead. Pt wanted latest possible time for the appt. Scheduled pt for 2pm 2/6. Pt confirmed new time/date and thankful for the call.No further questions.

## 2017-02-13 ENCOUNTER — Telehealth: Payer: Self-pay | Admitting: Obstetrics and Gynecology

## 2017-02-13 NOTE — Telephone Encounter (Signed)
Patient is having a PET scan 02/15/17 ans is asking if she needs to remove her pessary before having this scan? Patient asked if any part of her pessary is metal?

## 2017-02-13 NOTE — Telephone Encounter (Signed)
Patient states that she is to have a PET scan for metastatic breast cancer. Asking if her pessary needs to be removed for this exam. Advised the pessary does not have any type of metal material and is safe to leave in during her PET scan. Patient verbalizes understanding.  Routing to provider for final review. Patient agreeable to disposition. Will close encounter.

## 2017-02-15 ENCOUNTER — Ambulatory Visit: Payer: Medicare Other | Admitting: Orthotics

## 2017-02-15 ENCOUNTER — Encounter (HOSPITAL_COMMUNITY)
Admission: RE | Admit: 2017-02-15 | Discharge: 2017-02-15 | Disposition: A | Discharge status: Home / Self Care | Payer: Medicare Other | Source: Ambulatory Visit | Attending: Hematology and Oncology | Admitting: Hematology and Oncology

## 2017-02-15 DIAGNOSIS — M79672 Pain in left foot: Secondary | ICD-10-CM

## 2017-02-15 DIAGNOSIS — C50311 Malignant neoplasm of lower-inner quadrant of right female breast: Secondary | ICD-10-CM | POA: Insufficient documentation

## 2017-02-15 DIAGNOSIS — M79676 Pain in unspecified toe(s): Secondary | ICD-10-CM

## 2017-02-15 DIAGNOSIS — Z171 Estrogen receptor negative status [ER-]: Secondary | ICD-10-CM | POA: Diagnosis present

## 2017-02-15 DIAGNOSIS — C50911 Malignant neoplasm of unspecified site of right female breast: Secondary | ICD-10-CM | POA: Diagnosis not present

## 2017-02-15 LAB — GLUCOSE, CAPILLARY: Glucose-Capillary: 93 mg/dL (ref 65–99)

## 2017-02-15 MED ORDER — FLUDEOXYGLUCOSE F - 18 (FDG) INJECTION
6.9000 | Freq: Once | INTRAVENOUS | Status: AC | PRN
  Administered 2017-02-15: 6.9 via INTRAVENOUS

## 2017-02-15 NOTE — Progress Notes (Signed)
Patient came in today to pick up custom made foot orthotics.  The goals were accomplished and the patient reported no dissatisfaction with said orthotics.  Patient was advised of breakin period and how to report any issues. 

## 2017-02-16 ENCOUNTER — Telehealth: Payer: Self-pay | Admitting: Surgery

## 2017-02-16 ENCOUNTER — Ambulatory Visit: Payer: Medicare Other | Admitting: Orthotics

## 2017-02-16 DIAGNOSIS — M79672 Pain in left foot: Secondary | ICD-10-CM

## 2017-02-16 DIAGNOSIS — M216X2 Other acquired deformities of left foot: Secondary | ICD-10-CM

## 2017-02-16 DIAGNOSIS — L84 Corns and callosities: Secondary | ICD-10-CM

## 2017-02-16 NOTE — Telephone Encounter (Signed)
This is one of those false encounters that Epic assigns to me.  Epic seems to create these out of thin air.   And I cannot delete them once created.

## 2017-02-16 NOTE — Progress Notes (Signed)
Patient wants f/o adjusted to be more narrow in heel to fit in flats; she wants me to include an apex lynco f/o when I send back to [facility]

## 2017-02-17 DIAGNOSIS — Z853 Personal history of malignant neoplasm of breast: Secondary | ICD-10-CM | POA: Diagnosis not present

## 2017-02-20 ENCOUNTER — Ambulatory Visit: Payer: Medicare Other | Admitting: Hematology and Oncology

## 2017-02-21 ENCOUNTER — Encounter: Payer: Self-pay | Admitting: *Deleted

## 2017-02-21 NOTE — Progress Notes (Signed)
Fax PET, mammo and path report to Dr. Crissie Reese office

## 2017-02-22 ENCOUNTER — Inpatient Hospital Stay: Payer: Medicare Other | Attending: Hematology and Oncology | Admitting: Hematology and Oncology

## 2017-02-22 DIAGNOSIS — C792 Secondary malignant neoplasm of skin: Secondary | ICD-10-CM | POA: Diagnosis not present

## 2017-02-22 DIAGNOSIS — Z171 Estrogen receptor negative status [ER-]: Secondary | ICD-10-CM | POA: Diagnosis not present

## 2017-02-22 DIAGNOSIS — C50311 Malignant neoplasm of lower-inner quadrant of right female breast: Secondary | ICD-10-CM | POA: Diagnosis not present

## 2017-02-22 NOTE — Assessment & Plan Note (Signed)
Right lumpectomy 02/03/2015: Invasive ductal carcinoma grade 2, 2.1 cm, with associated DCIS, margins are negative, 0/1 lymph node, ER 0%, PR 0%, HER-2 positiveratio 2.31, T2 N0 stage II a. 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 Adjuvant chemotherapy with [place] 6 cycles started 03/09/15 completed 06/22/15 tookHerceptin maintenance for 1 year until 02/15/2016 2. Followed by radiation therapy 08/11/2015 to09/05/2015 -------------------------------------------------------------------------------------------------------------------------------------------------------- Cutaneous recurrence of breast cancer: Skin biopsy came back positive for metastatic breast cancer.  ER 0%, PR 0%, HER-2 negative  Recommendation: 1.  PET/CT scan: 02/16/2017: No evidence of distant metastatic disease apart from involvement of the skin 2. tumor board discussion: The recommendation was for her to undergo mastectomy.  Patient plans to undergo reconstruction and has an appointment to see Dr. Harlow Mares.  Discussion regarding chemotherapy: I discussed with her that ideally she would need to consider systemic chemotherapy but based upon recent chemotherapy that she had and her personal desires, we decided not to pursue chemotherapy for his reduction.  I contacted surgery to inform them that we do not need a port.  Return to clinic after surgery to discuss the final pathology report.

## 2017-02-22 NOTE — Progress Notes (Signed)
Patient Care Team: [name], MD as PCP - General (Internal Medicine) [name], MD as Consulting Physician (General Surgery) [name], MD as Consulting Physician (Hematology and Oncology) Delice Bison Charlestine Massed, NP as Nurse Practitioner (Hematology and Oncology)  DIAGNOSIS:  Encounter Diagnosis  Name Primary?  . 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 Genes tested include:  ATM, BARD1, BRCA1, BRCA2, BRIP1, CDH1, CHEK2, EPCAM, FANCC, MLH1, MSH2, MSH6, NBN, PALB2, PMS2, PTEN, RAD51C, RAD51D, TP53, and XRCC2.      02/03/2015 Surgery    Right lumpectomy: Invasive ductal carcinoma grade 2, 2.1 cm, with associated DCIS, margins are negative, 0/1 lymph node, ER 0%, PR 0%, HER-2 positive ratio 2.31, T2 N0 stage II a      03/09/2015 - 06/22/2015 Chemotherapy    Adjuvant chemotherapy with [place] 6 followed by Herceptin maintenance for 1 year      08/11/2015 - 09/23/2015 Radiation Therapy    Adjuvant radiation therapy [name]):  1) Right Breast / 50 Gy in 25 fractions ; 2) Right Breast Boost / 10 Gy in 5 fractions      01/30/2017 Relapse/Recurrence    Skin biopsy right breast: Metastatic breast cancer GCDFP strongly positive      02/15/2017 PET scan    Hypermetabolic focus of skin thickening along the medial right breast/chest wall consistent with recurrent disease, no other hypermetabolic metastases identified.       CHIEF COMPLIANT: Follow-up  to discuss the PET/CT scan as well as a tumor board discussion  INTERVAL HISTORY: Shawna [name] is a 77 year old with above-mentioned history of recurrent right breast cancer with his cutaneous involvement.  She underwent a PET/CT scan and is here today to discuss results.  She also was presented at the multidisciplinary tumor board and is here to talk about the final treatment plan.  She has an appointment to see Dr. Harlow Mares with plastic surgery to discuss the pros and cons of breast reconstruction.  REVIEW OF SYSTEMS:   Constitutional: Denies fevers, chills or abnormal weight loss Eyes: Denies blurriness of vision Ears, nose, mouth, throat, and face: Denies mucositis or sore throat Respiratory: Denies cough, dyspnea or wheezes Cardiovascular: Denies palpitation, chest discomfort Gastrointestinal:  Denies nausea, heartburn or change in bowel habits Skin: Denies abnormal skin rashes Lymphatics: Denies new lymphadenopathy or easy bruising Neurological:Denies numbness, tingling or new weaknesses Behavioral/Psych: Mood is stable, no new changes  Extremities: No lower extremity edema All other systems were reviewed with the patient and are negative.  I have reviewed the past medical history, past surgical history, social history and family history with the patient and they are unchanged from previous note.  ALLERGIES:  has No Known Allergies.  MEDICATIONS:  Current Outpatient Medications  Medication Sig Dispense Refill  . B Complex-Biotin-FA (SUPER B-50 COMPLEX) CAPS Take 1 capsule by mouth daily.    . CRESTOR 20 MG tablet Take 1 tablet by mouth daily.  0  . hydrochlorothiazide (MICROZIDE) 12.5 MG capsule Take 12.5 mg by mouth every other day.     . losartan (COZAAR) 100 MG tablet Take 100 mg by mouth daily.  0  . Vitamin D, Ergocalciferol, 2000 units CAPS Take by mouth daily.    . vitamin E 100 UNIT capsule Take 1 capsule (100 Units total) by mouth daily.     Current  Facility-Administered Medications  Medication Dose Route Frequency Provider Last Rate Last Dose  . 0.9 %  sodium chloride infusion  500 mL Intravenous Continuous Irene Shipper, MD        PHYSICAL EXAMINATION: ECOG PERFORMANCE STATUS: 1 - Symptomatic but completely ambulatory  Vitals:   02/22/17 1343  BP: (!) 141/78  Pulse: 78  Resp: 17  Temp: 98.1 F (36.7 C)  SpO2: 100%   Filed Weights   02/22/17 1343  Weight: 141 lb (64 kg)    GENERAL:alert, no distress and comfortable SKIN: skin color, texture, turgor are normal, no rashes or significant lesions EYES: normal, Conjunctiva are pink and non-injected, sclera clear OROPHARYNX:no exudate, no erythema and lips, buccal mucosa, and tongue normal  NECK: supple, thyroid normal size, non-tender, without nodularity LYMPH:  no palpable lymphadenopathy in the cervical, axillary or inguinal LUNGS: clear to auscultation and percussion with normal breathing effort HEART: regular rate & rhythm and no murmurs and no lower extremity edema ABDOMEN:abdomen soft, non-tender and normal bowel sounds MUSCULOSKELETAL:no cyanosis of digits and no clubbing  NEURO: alert & oriented x 3 with fluent speech, no focal motor/sensory deficits EXTREMITIES: No lower extremity edema  LABORATORY DATA:  I have reviewed the data as listed CMP Latest Ref Rng & Units 01/25/2016 12/14/2015 11/02/2015  Glucose 70 - 140 mg/dl 79 93 69(L)  BUN 7.0 - 26.0 mg/dL 30.7(H) 25.7 20.5  Creatinine 0.6 - 1.1 mg/dL 1.0 0.9 0.9  Sodium 136 - 145 mEq/L 140 143 142  Potassium 3.5 - 5.1 mEq/L 3.8 4.1 3.9  Chloride 101 - 111 mmol/L - - -  CO2 22 - 29 mEq/L '24 25 25  ' Calcium 8.4 - 10.4 mg/dL 9.8 9.3 9.5  Total Protein 6.4 - 8.3 g/dL 6.5 6.3(L) 6.4  Total Bilirubin 0.20 - 1.20 mg/dL 0.69 0.66 0.53  Alkaline Phos 40 - 150 U/L 113 142 147  AST 5 - 34 U/L 33 55(H) 30  ALT 0 - 55 U/L 33 76(H) 33    Lab Results  Component Value Date   WBC 3.7 (L) 01/25/2016   HGB 12.6 01/25/2016     HCT 37.3 01/25/2016   MCV 98.7 01/25/2016   PLT 155 01/25/2016   NEUTROABS 2.8 01/25/2016    ASSESSMENT & PLAN:  Breast cancer of lower-inner quadrant of right female breast ([place]) Right lumpectomy 02/03/2015: Invasive ductal carcinoma grade 2, 2.1 cm, with associated DCIS, margins are negative, 0/1 lymph node, ER 0%, PR 0%, HER-2 positiveratio 2.31, T2 N0 stage II a. 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 Adjuvant chemotherapy with [place] 6 cycles started 03/09/15 completed 06/22/15 tookHerceptin maintenance for 1 year until 02/15/2016 2. Followed by radiation therapy 08/11/2015 to09/05/2015 -------------------------------------------------------------------------------------------------------------------------------------------------------- Cutaneous recurrence of breast cancer: Skin biopsy came back positive for metastatic breast cancer.  ER 0%, PR 0%, HER-2 negative  Recommendation: 1.  PET/CT scan: 02/16/2017: No evidence of distant metastatic disease  apart from involvement of the skin 2. tumor board discussion: The recommendation was for her to undergo mastectomy.  Patient plans to undergo reconstruction and has an appointment to see Dr. Harlow Mares.  Discussion regarding chemotherapy: I discussed with her that ideally she would need to consider systemic chemotherapy but based upon recent chemotherapy that she had and her personal desires, we decided not to pursue chemotherapy for risk reduction.  I contacted surgery to inform them that we do not need a port.  Return to clinic after surgery to discuss the final pathology report.   I spent 25 minutes talking to the patient of which more than half was spent in counseling and coordination of care.  No orders of the defined types were  placed in this encounter.  The patient has a good understanding of the overall plan. she agrees with it. she will call with any problems that may develop before the next visit here.   Harriette Ohara, MD 02/22/17

## 2017-02-24 DIAGNOSIS — C792 Secondary malignant neoplasm of skin: Secondary | ICD-10-CM | POA: Diagnosis not present

## 2017-02-24 DIAGNOSIS — C50911 Malignant neoplasm of unspecified site of right female breast: Secondary | ICD-10-CM | POA: Diagnosis not present

## 2017-02-27 ENCOUNTER — Ambulatory Visit: Payer: Self-pay | Admitting: Surgery

## 2017-02-27 DIAGNOSIS — C50911 Malignant neoplasm of unspecified site of right female breast: Secondary | ICD-10-CM

## 2017-03-03 ENCOUNTER — Telehealth: Payer: Self-pay | Admitting: Podiatry

## 2017-03-03 ENCOUNTER — Telehealth: Payer: Self-pay | Admitting: Hematology and Oncology

## 2017-03-03 NOTE — Telephone Encounter (Signed)
Spoke to patient regarding upcoming February appointments per 2/15 sch message

## 2017-03-03 NOTE — Telephone Encounter (Signed)
Pt left voicemail for me checking on her orthotics that were sent back to be redone.  I returned call after speaking to [facility] and they are not back yet and should be here next week and I would call when they come in. Pt asked if daughter could pick them up since she is having surgery next Tuesday. I told her that would be fine her daughter would just have to sign that she is picking them up.

## 2017-03-06 ENCOUNTER — Encounter (HOSPITAL_COMMUNITY): Payer: Self-pay | Admitting: *Deleted

## 2017-03-06 ENCOUNTER — Other Ambulatory Visit: Payer: Self-pay

## 2017-03-06 NOTE — H&P (Signed)
Shawna [name]  Location: [facility] Surgery Patient #: [id number] DOB: 05-20-1940 Widowed / Language: Shawna [name] / Race: White Female  History of Present Illness   The patient is a 77 year old female who presents with breast cancer.  His PCP is Shawna [name].  Her GYN caretaker is Shawna Circle, NP.  She comes by herself.  She had noticed a nodule that came up medial to her scar in the right breast breast. She thought it was a mole, but saw her dermatologist. Shawna [name] had a skin biopsy which revealed recurrent breast cancer. She had a right breast skin biopsy by Dr. Wilhemina [name] on 02/01/2017 [phone]) which showed adenoca involving the dermis, consistent with breast metastasis. Breast markers are pending. She has seen Dr. Lindi [name], who has ordered a PET scan for 02/15/2017. She has a 77th birthday planned in middle of March in [place]. If she needs surgery, I would encourage her to do that once we make a decision.  Plan: 1) Markers on cancer and PET scan, 2) If scan negative, consider wide excision versus mastectomy (if wide excision, she'll need an MRI of the breasts) - I will talk to her after the PET scan  PMH: 1. Right breast cancer She had mammograms at [facility] on 12/02/2014 which showed a 1.0 cm mass in the lower inner quadrant of the right breast. She had a biopsy of her right breast on 12/09/2014 [phone]) which showed IDC of the right breast, ER - 0%, PR - 0%, Ki67 - 90%, Her2Neu neg. Her mother and sister had breast cancer. She has another sister who died of colon ca. I did Shawna. [name]'s right breast lumpectomy, right axillary SLNBx, and left subclavian power port on 02/03/2015. Her final path showed a 2.1 cm IDC, 0/1 nodes, but Her2Neu positive. She just finished rad tx - 09/23/2015 She received one year herceptin. 2. HTN 3. 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 History of kidney  stones Has had lithotripsy. Sees Dr. Risa [name]. 6. I removed power port - 02/19/2016 - [facility]  Social history: Widowed. Her husband died suddenly of a MI (?) about 15 years ago. Her daughter, Shawna [name], has come with her before. She also has a son. Her daughter lives in [place]. Her son is in [place] with his father, who is not in good shape. She retired from [employer] at the end of 2016.   Allergies (Shawna [name], Shawna [name]; 02/08/2017 9:18 AM) No Known Drug Allergies [12/19/2014]: Allergies Reconciled   Medication History (Shawna [name], [name]; 02/08/2017 9:18 AM) Drisdol (50000UNIT Capsule, Oral twice weekly) Active. Gabapentin (300MG Capsule, Oral) Active. HydroCHLOROthiazide (12.5MG Tablet, Oral) Active. Rosuvastatin Calcium (20MG Tablet, Oral) Active. Telmisartan (80MG Tablet, Oral) Active. Prochlorperazine Maleate (10MG Tablet, Oral) Active. Ondansetron HCl (8MG Tablet, Oral) Active. LORazepam (0.5MG Tablet, Oral) Active. Hydrocodone-Acetaminophen (5-325MG Tablet, Oral) Active. Dexamethasone (4MG Tablet, Oral) Active. Lidocaine-Prilocaine (2.5-2.5% Cream, External) Active. Zoledronic Acid (5MG/100ML Solution, Intravenous) Active. Crestor (20MG Tablet, Oral) Active. Telmisartan-HCTZ (80-12.5MG Tablet, Oral) Active. VESIcare (5MG Tablet, Oral) Active. Vitamin D (2000UNIT Tablet, Oral) Active. ALPRAZolam (0.25MG Tablet, Oral) Active. Calcium Carbonate (600MG Tablet, Oral) Active. Medications Reconciled  Vitals (Shawna [name]; 02/08/2017 9:17 AM) 02/08/2017 9:17 AM Weight: 140.4 lb Height: 68.5in Body Surface Area: 1.77 m Body Mass Index: 21.04 kg/m  Temp.: 98.110F  Pulse: 69 (Regular)  BP: 128/82 (Sitting, Left Arm, Standard)   Physical Exam  General: WN older WF alert and generally healthy appearing. HEENT: Normal. Pupils equal.  Neck: Supple. No mass. No thyroid mass.  Lymph Nodes: No supraclavicular or cervical or  axillary nodes. Right axillary incision looks good  Lungs - Clear Heart - RRR, no murmur  Breast: Right - Scar at 3 o'clock looks good. 1 cm medial to the scar is the suture from the skin biopsy. The recurrence is not in the scar, but it is close. I feel no other mass in the breast. Left - no mass. Scar in the UIQ of the left breast from port.  Extremities: Good strength. No swelling.  Assessment & Plan  1. RECURRENT BREAST CANCER, RIGHT (C50.911)  Story: Right breast skin biopsy by Shawna [name] on 02/01/2017 [phone]) which showed adenoca involving the dermis, consistent with breast metastasis.  Plan:   1)    2) If PET negative, plan surgical therapy PET scan 02/15/2017 -   Her PET scan yesterday showed only the skin in the breast as abnormal.   Addendum Note(Shawna [name]; 02/15/2017 8:19 AM)   Presented at breast cancer conf - there was a consensus that if her PET scan is negative, she would be best served with a right mastectomy.  Addendum Note(Shawna [name]; 02/22/2017 8:32 PM)   Shawna [name]   Shawna [name] does not want any more chemo.   No need of port.   Thanks, Shawna [name]  Addendum Note(Shawna [name]; 02/27/2017 3:59 PM)    She met w/Shawna [name] on Friday. She is NOT proceeding with reconstruction. She would like to move forward with [facility] ASAP. Please send posting sheet.   Thanks,  Shawna [name]    3)  Proceed with right mastectomy and attempted right axillary SLNBx  2.  BREAST CANCER, STAGE 1, RIGHT (C50.911)  Story: Biopsy of her right breast on 12/09/2014 [phone]) which showed IDC of the right breast, ER - 0%, PR - 0%, Ki67 - 90%, Her2Neu neg.  Right breast lumpectomy (02/03/2015) - [id number] - 2.1 cm IDC, 0/1 nodes, Her 2 neu - POSITIVE  Oncology - Shawna [name]/Shawna [name]  3. HTN 4. hypercholesterolemia 5. benign tumor of ovary removed remotely 6. History of kidney stones Has had lithotripsy. Sees Dr. Risa [name].         ------------------------ I  spoke with the patient. Her daughter is coming in from [place]. Plan to proceed witht he mastectomy without reconstruction, SLNBx.    Shawna Overall, [name], [facility] Surgery Pager: [phone] Office phone:  [phone]

## 2017-03-06 NOTE — Progress Notes (Signed)
Spoke with pt for pre-op call. She denies cardiac history, chest pain or diabetes.

## 2017-03-07 ENCOUNTER — Ambulatory Visit (HOSPITAL_COMMUNITY): Payer: Medicare Other | Admitting: Anesthesiology

## 2017-03-07 ENCOUNTER — Encounter (HOSPITAL_COMMUNITY): Payer: Self-pay

## 2017-03-07 ENCOUNTER — Encounter (HOSPITAL_COMMUNITY)
Admission: RE | Disposition: A | Discharge status: Home / Self Care | Payer: Self-pay | Source: Ambulatory Visit | Attending: Surgery

## 2017-03-07 ENCOUNTER — Encounter (HOSPITAL_COMMUNITY)
Admission: RE | Admit: 2017-03-07 | Discharge: 2017-03-07 | Disposition: A | Discharge status: Home / Self Care | Payer: Medicare Other | Source: Ambulatory Visit | Attending: Surgery | Admitting: Surgery

## 2017-03-07 ENCOUNTER — Observation Stay (HOSPITAL_COMMUNITY)
Admission: RE | Admit: 2017-03-07 | Discharge: 2017-03-08 | Disposition: A | Discharge status: Home / Self Care | Payer: Medicare Other | Source: Ambulatory Visit | Attending: Surgery | Admitting: Surgery

## 2017-03-07 DIAGNOSIS — Z87442 Personal history of urinary calculi: Secondary | ICD-10-CM | POA: Diagnosis not present

## 2017-03-07 DIAGNOSIS — I252 Old myocardial infarction: Secondary | ICD-10-CM | POA: Insufficient documentation

## 2017-03-07 DIAGNOSIS — E7800 Pure hypercholesterolemia, unspecified: Secondary | ICD-10-CM | POA: Diagnosis not present

## 2017-03-07 DIAGNOSIS — I10 Essential (primary) hypertension: Secondary | ICD-10-CM | POA: Diagnosis not present

## 2017-03-07 DIAGNOSIS — C50911 Malignant neoplasm of unspecified site of right female breast: Principal | ICD-10-CM | POA: Diagnosis present

## 2017-03-07 DIAGNOSIS — Z79899 Other long term (current) drug therapy: Secondary | ICD-10-CM | POA: Diagnosis not present

## 2017-03-07 DIAGNOSIS — Z803 Family history of malignant neoplasm of breast: Secondary | ICD-10-CM | POA: Insufficient documentation

## 2017-03-07 DIAGNOSIS — G8918 Other acute postprocedural pain: Secondary | ICD-10-CM | POA: Diagnosis not present

## 2017-03-07 DIAGNOSIS — C50211 Malignant neoplasm of upper-inner quadrant of right female breast: Secondary | ICD-10-CM | POA: Diagnosis not present

## 2017-03-07 HISTORY — DX: Personal history of urinary calculi: Z87.442

## 2017-03-07 HISTORY — PX: MASTECTOMY W/ SENTINEL NODE BIOPSY: SHX2001

## 2017-03-07 LAB — CBC
HCT: 38.9 % (ref 36.0–46.0)
Hemoglobin: 13.3 g/dL (ref 12.0–15.0)
MCH: 34.1 pg — AB (ref 26.0–34.0)
MCHC: 34.2 g/dL (ref 30.0–36.0)
MCV: 99.7 fL (ref 78.0–100.0)
PLATELETS: 169 10*3/uL (ref 150–400)
RBC: 3.9 MIL/uL (ref 3.87–5.11)
RDW: 13.2 % (ref 11.5–15.5)
WBC: 3.1 10*3/uL — AB (ref 4.0–10.5)

## 2017-03-07 LAB — BASIC METABOLIC PANEL
Anion gap: 11 (ref 5–15)
BUN: 26 mg/dL — ABNORMAL HIGH (ref 6–20)
CHLORIDE: 107 mmol/L (ref 101–111)
CO2: 22 mmol/L (ref 22–32)
CREATININE: 0.94 mg/dL (ref 0.44–1.00)
Calcium: 9.2 mg/dL (ref 8.9–10.3)
GFR calc non Af Amer: 58 mL/min — ABNORMAL LOW (ref >60)
Glucose, Bld: 82 mg/dL (ref 65–99)
POTASSIUM: 3.4 mmol/L — AB (ref 3.5–5.1)
SODIUM: 140 mmol/L (ref 135–145)

## 2017-03-07 SURGERY — MASTECTOMY WITH SENTINEL LYMPH NODE BIOPSY
Anesthesia: Regional | Site: Breast | Laterality: Right

## 2017-03-07 MED ORDER — SUGAMMADEX SODIUM 200 MG/2ML IV SOLN
INTRAVENOUS | Status: AC
  Filled 2017-03-07: qty 2

## 2017-03-07 MED ORDER — SUGAMMADEX SODIUM 200 MG/2ML IV SOLN
INTRAVENOUS | Status: DC | PRN
  Administered 2017-03-07: 140 mg via INTRAVENOUS

## 2017-03-07 MED ORDER — ONDANSETRON HCL 4 MG/2ML IJ SOLN
INTRAMUSCULAR | Status: DC | PRN
  Administered 2017-03-07: 4 mg via INTRAVENOUS

## 2017-03-07 MED ORDER — CHLORHEXIDINE GLUCONATE CLOTH 2 % EX PADS: 6.0000 | MEDICATED_PAD | Freq: Once | CUTANEOUS | Status: DC

## 2017-03-07 MED ORDER — KCL IN DEXTROSE-NACL 20-5-0.45 MEQ/L-%-% IV SOLN
INTRAVENOUS | Status: DC
  Administered 2017-03-07 (×2): via INTRAVENOUS
  Filled 2017-03-07 (×2): qty 1000

## 2017-03-07 MED ORDER — ROCURONIUM BROMIDE 10 MG/ML (PF) SYRINGE
PREFILLED_SYRINGE | INTRAVENOUS | Status: AC
  Filled 2017-03-07: qty 5

## 2017-03-07 MED ORDER — FENTANYL CITRATE (PF) 250 MCG/5ML IJ SOLN
INTRAMUSCULAR | Status: AC
  Filled 2017-03-07: qty 5

## 2017-03-07 MED ORDER — MORPHINE SULFATE (PF) 4 MG/ML IV SOLN: 1.0000 mg | INTRAVENOUS | Status: DC | PRN

## 2017-03-07 MED ORDER — LIDOCAINE 2% (20 MG/ML) 5 ML SYRINGE
INTRAMUSCULAR | Status: AC
  Filled 2017-03-07: qty 5

## 2017-03-07 MED ORDER — DEXAMETHASONE SODIUM PHOSPHATE 10 MG/ML IJ SOLN
INTRAMUSCULAR | Status: DC | PRN
  Administered 2017-03-07: 10 mg via INTRAVENOUS

## 2017-03-07 MED ORDER — ACETAMINOPHEN 500 MG PO TABS
1000.0000 mg | ORAL_TABLET | ORAL | Status: AC
  Administered 2017-03-07: 1000 mg via ORAL
  Filled 2017-03-07: qty 2

## 2017-03-07 MED ORDER — CEFAZOLIN SODIUM-DEXTROSE 2-4 GM/100ML-% IV SOLN
2.0000 g | INTRAVENOUS | Status: AC
  Administered 2017-03-07: 2 g via INTRAVENOUS
  Filled 2017-03-07: qty 100

## 2017-03-07 MED ORDER — FENTANYL CITRATE (PF) 250 MCG/5ML IJ SOLN
INTRAMUSCULAR | Status: DC | PRN
  Administered 2017-03-07 (×5): 50 ug via INTRAVENOUS

## 2017-03-07 MED ORDER — TECHNETIUM TC 99M SULFUR COLLOID FILTERED
1.0000 | Freq: Once | INTRAVENOUS | Status: AC | PRN
  Administered 2017-03-07: 1 via INTRADERMAL

## 2017-03-07 MED ORDER — SODIUM CHLORIDE 0.9 % IJ SOLN
INTRAMUSCULAR | Status: DC | PRN
  Administered 2017-03-07: .5 mL

## 2017-03-07 MED ORDER — ONDANSETRON HCL 4 MG/2ML IJ SOLN: 4.0000 mg | Freq: Four times a day (QID) | INTRAMUSCULAR | Status: DC | PRN

## 2017-03-07 MED ORDER — HYDROCHLOROTHIAZIDE 12.5 MG PO CAPS: 12.5000 mg | ORAL_CAPSULE | Freq: Every day | ORAL | Status: DC

## 2017-03-07 MED ORDER — TRAMADOL HCL 50 MG PO TABS
50.0000 mg | ORAL_TABLET | Freq: Four times a day (QID) | ORAL | Status: DC | PRN
  Administered 2017-03-07: 50 mg via ORAL
  Filled 2017-03-07: qty 1

## 2017-03-07 MED ORDER — OXYCODONE HCL 5 MG PO TABS
5.0000 mg | ORAL_TABLET | Freq: Once | ORAL | Status: AC | PRN
  Administered 2017-03-07: 5 mg via ORAL

## 2017-03-07 MED ORDER — LACTATED RINGERS IV SOLN
INTRAVENOUS | Status: DC | PRN
  Administered 2017-03-07: 07:00:00 via INTRAVENOUS

## 2017-03-07 MED ORDER — ONDANSETRON 4 MG PO TBDP
4.0000 mg | ORAL_TABLET | Freq: Four times a day (QID) | ORAL | Status: DC | PRN
  Administered 2017-03-07: 4 mg via ORAL
  Filled 2017-03-07: qty 1

## 2017-03-07 MED ORDER — PROPOFOL 10 MG/ML IV BOLUS
INTRAVENOUS | Status: DC | PRN
  Administered 2017-03-07: 110 mg via INTRAVENOUS

## 2017-03-07 MED ORDER — OXYCODONE HCL 5 MG PO TABS
ORAL_TABLET | ORAL | Status: AC
  Filled 2017-03-07: qty 1

## 2017-03-07 MED ORDER — ONDANSETRON HCL 4 MG/2ML IJ SOLN
INTRAMUSCULAR | Status: AC
  Filled 2017-03-07: qty 2

## 2017-03-07 MED ORDER — GABAPENTIN 300 MG PO CAPS
300.0000 mg | ORAL_CAPSULE | ORAL | Status: AC
  Administered 2017-03-07: 300 mg via ORAL
  Filled 2017-03-07: qty 1

## 2017-03-07 MED ORDER — SODIUM CHLORIDE 0.9 % IJ SOLN
INTRAMUSCULAR | Status: AC
  Filled 2017-03-07: qty 10

## 2017-03-07 MED ORDER — LIDOCAINE 2% (20 MG/ML) 5 ML SYRINGE
INTRAMUSCULAR | Status: DC | PRN
  Administered 2017-03-07: 80 mg via INTRAVENOUS

## 2017-03-07 MED ORDER — LOSARTAN POTASSIUM 50 MG PO TABS: 100.0000 mg | ORAL_TABLET | Freq: Every day | ORAL | Status: DC

## 2017-03-07 MED ORDER — 0.9 % SODIUM CHLORIDE (POUR BTL) OPTIME
TOPICAL | Status: DC | PRN
  Administered 2017-03-07 (×2): 1000 mL

## 2017-03-07 MED ORDER — ROCURONIUM BROMIDE 10 MG/ML (PF) SYRINGE
PREFILLED_SYRINGE | INTRAVENOUS | Status: DC | PRN
  Administered 2017-03-07: 50 mg via INTRAVENOUS

## 2017-03-07 MED ORDER — HYDROMORPHONE HCL 1 MG/ML IJ SOLN
0.2500 mg | INTRAMUSCULAR | Status: DC | PRN
  Administered 2017-03-07 (×2): 0.5 mg via INTRAVENOUS

## 2017-03-07 MED ORDER — PROPOFOL 10 MG/ML IV BOLUS
INTRAVENOUS | Status: AC
  Filled 2017-03-07: qty 40

## 2017-03-07 MED ORDER — HEPARIN SODIUM (PORCINE) 5000 UNIT/ML IJ SOLN
5000.0000 [IU] | Freq: Three times a day (TID) | INTRAMUSCULAR | Status: DC
  Administered 2017-03-08: 5000 [IU] via SUBCUTANEOUS
  Filled 2017-03-07: qty 1

## 2017-03-07 MED ORDER — MEPERIDINE HCL 50 MG/ML IJ SOLN: 6.2500 mg | INTRAMUSCULAR | Status: DC | PRN

## 2017-03-07 MED ORDER — METHYLENE BLUE 0.5 % INJ SOLN
INTRAVENOUS | Status: AC
  Filled 2017-03-07: qty 10

## 2017-03-07 MED ORDER — HYDROMORPHONE HCL 1 MG/ML IJ SOLN
INTRAMUSCULAR | Status: AC
  Filled 2017-03-07: qty 1

## 2017-03-07 MED ORDER — PROMETHAZINE HCL 25 MG/ML IJ SOLN: 6.2500 mg | INTRAMUSCULAR | Status: DC | PRN

## 2017-03-07 MED ORDER — GABAPENTIN 300 MG PO CAPS
300.0000 mg | ORAL_CAPSULE | Freq: Two times a day (BID) | ORAL | Status: DC
  Administered 2017-03-07 (×2): 300 mg via ORAL
  Filled 2017-03-07 (×2): qty 1

## 2017-03-07 MED ORDER — HYDROCODONE-ACETAMINOPHEN 5-325 MG PO TABS: 1.0000 | ORAL_TABLET | ORAL | Status: DC | PRN

## 2017-03-07 MED ORDER — DEXAMETHASONE SODIUM PHOSPHATE 10 MG/ML IJ SOLN
INTRAMUSCULAR | Status: AC
  Filled 2017-03-07: qty 1

## 2017-03-07 MED ORDER — ROPIVACAINE HCL 5 MG/ML IJ SOLN
INTRAMUSCULAR | Status: DC | PRN
  Administered 2017-03-07: 30 mL via PERINEURAL

## 2017-03-07 MED ORDER — OXYCODONE HCL 5 MG/5ML PO SOLN: 5.0000 mg | Freq: Once | ORAL | Status: AC | PRN

## 2017-03-07 SURGICAL SUPPLY — 55 items
ADH SKN CLS APL DERMABOND .7 (GAUZE/BANDAGES/DRESSINGS) ×2
APPLIER CLIP 9.375 MED OPEN (MISCELLANEOUS) ×2
APR CLP MED 9.3 20 MLT OPN (MISCELLANEOUS) ×1
ATCH SMKEVC FLXB CAUT HNDSWH (FILTER) ×1 IMPLANT
BINDER BREAST LRG (GAUZE/BANDAGES/DRESSINGS) ×2 IMPLANT
BINDER BREAST XLRG (GAUZE/BANDAGES/DRESSINGS) IMPLANT
BIOPATCH RED 1 DISK 7.0 (GAUZE/BANDAGES/DRESSINGS) ×4 IMPLANT
CANISTER SUCT 3000ML PPV (MISCELLANEOUS) ×2 IMPLANT
CHLORAPREP W/TINT 26ML (MISCELLANEOUS) ×2 IMPLANT
CLIP APPLIE 9.375 MED OPEN (MISCELLANEOUS) ×1 IMPLANT
CONT SPEC 4OZ CLIKSEAL STRL BL (MISCELLANEOUS) ×4 IMPLANT
COVER PROBE W GEL 5X96 (DRAPES) ×2 IMPLANT
COVER SURGICAL LIGHT HANDLE (MISCELLANEOUS) ×2 IMPLANT
DERMABOND ADVANCED (GAUZE/BANDAGES/DRESSINGS) ×2
DERMABOND ADVANCED .7 DNX12 (GAUZE/BANDAGES/DRESSINGS) ×2 IMPLANT
DRAIN CHANNEL 19F RND (DRAIN) ×4 IMPLANT
DRAPE CHEST BREAST 15X10 FENES (DRAPES) ×2 IMPLANT
DRAPE HALF SHEET 40X57 (DRAPES) ×2 IMPLANT
DRSG PAD ABDOMINAL 8X10 ST (GAUZE/BANDAGES/DRESSINGS) ×4 IMPLANT
DRSG TEGADERM 4X4.75 (GAUZE/BANDAGES/DRESSINGS) ×2 IMPLANT
ELECT CAUTERY BLADE 6.4 (BLADE) ×2 IMPLANT
ELECT REM PT RETURN 9FT ADLT (ELECTROSURGICAL) ×4
ELECTRODE REM PT RTRN 9FT ADLT (ELECTROSURGICAL) ×2 IMPLANT
EVACUATOR SILICONE 100CC (DRAIN) ×4 IMPLANT
EVACUATOR SMOKE ACCUVAC VALLEY (FILTER) ×1
GAUZE SPONGE 4X4 12PLY STRL (GAUZE/BANDAGES/DRESSINGS) ×2 IMPLANT
GLOVE SURG SIGNA 7.5 PF LTX (GLOVE) ×2 IMPLANT
GOWN STRL REUS W/ TWL LRG LVL3 (GOWN DISPOSABLE) ×3 IMPLANT
GOWN STRL REUS W/ TWL XL LVL3 (GOWN DISPOSABLE) ×1 IMPLANT
GOWN STRL REUS W/TWL LRG LVL3 (GOWN DISPOSABLE) ×6
GOWN STRL REUS W/TWL XL LVL3 (GOWN DISPOSABLE) ×2
ILLUMINATOR WAVEGUIDE N/F (MISCELLANEOUS) IMPLANT
KIT BASIN OR (CUSTOM PROCEDURE TRAY) ×2 IMPLANT
KIT ROOM TURNOVER OR (KITS) ×2 IMPLANT
LIGHT WAVEGUIDE WIDE FLAT (MISCELLANEOUS) IMPLANT
MARKER SKIN DUAL TIP RULER LAB (MISCELLANEOUS) ×2 IMPLANT
NEEDLE 18GX1X1/2 (RX/OR ONLY) (NEEDLE) ×2 IMPLANT
NEEDLE FILTER BLUNT 18X 1/2SAF (NEEDLE) ×1
NEEDLE FILTER BLUNT 18X1 1/2 (NEEDLE) ×1 IMPLANT
NEEDLE HYPO 25GX1X1/2 BEV (NEEDLE) ×2 IMPLANT
NS IRRIG 1000ML POUR BTL (IV SOLUTION) ×4 IMPLANT
PACK GENERAL/GYN (CUSTOM PROCEDURE TRAY) ×2 IMPLANT
PAD ARMBOARD 7.5X6 YLW CONV (MISCELLANEOUS) ×2 IMPLANT
PREFILTER EVAC NS 1 1/3-3/8IN (MISCELLANEOUS) ×2 IMPLANT
SPECIMEN JAR LARGE (MISCELLANEOUS) ×2 IMPLANT
SPECIMEN JAR X LARGE (MISCELLANEOUS) IMPLANT
SPONGE LAP 18X18 X RAY DECT (DISPOSABLE) ×2 IMPLANT
SUT ETHILON 2 0 FS 18 (SUTURE) ×2 IMPLANT
SUT MNCRL AB 4-0 PS2 18 (SUTURE) ×2 IMPLANT
SUT SILK 2 0 PERMA HAND 18 BK (SUTURE) ×2 IMPLANT
SUT VIC AB 3-0 SH 18 (SUTURE) ×2 IMPLANT
SYR CONTROL 10ML LL (SYRINGE) IMPLANT
TOWEL OR 17X24 6PK STRL BLUE (TOWEL DISPOSABLE) ×2 IMPLANT
TOWEL OR 17X26 10 PK STRL BLUE (TOWEL DISPOSABLE) ×2 IMPLANT
TUBE CONNECTING 12X1/4 (SUCTIONS) ×2 IMPLANT

## 2017-03-07 NOTE — Anesthesia Procedure Notes (Signed)
Anesthesia Regional Block: Pectoralis block   Pre-Anesthetic Checklist: ,, timeout performed, Correct Patient, Correct Site, Correct Laterality, Correct Procedure, Correct Position, site marked, Risks and benefits discussed,  Surgical consent,  Pre-op evaluation,  At surgeon's request and post-op pain management  Laterality: Right  Prep: chloraprep       Needles:  Injection technique: Single-shot  Needle Type: Stimiplex     Needle Length: 9cm  Needle Gauge: 21     Additional Needles:   Procedures:,,,, ultrasound used (permanent image in chart),,,,  Narrative:  Start time: 03/07/2017 7:01 AM End time: 03/07/2017 7:06 AM Injection made incrementally with aspirations every 5 mL.  Performed by: Personally  Anesthesiologist: Lynda Rainwater, MD

## 2017-03-07 NOTE — Progress Notes (Signed)
Patient arrived to 6N02 A&Ox4, VSS, IV intact and infusing.  Noted to have incision on R breast with skin glue closure and guaze, abd pad, and breast binder covering.  Patient denies pain at this time. Family at bedside.  Family and patient oriented to room and equipment.  Will continue to monitor.

## 2017-03-07 NOTE — Op Note (Signed)
03/07/2017  8:45 AM  PATIENT:  Shawna [name], 77 y.o., female, MRN: [id number]  PREOP DIAGNOSIS:  RECURRENT RIGHT BREAST CANCER  POSTOP DIAGNOSIS:   RECURRENT RIGHT BREAST CANCER, 3 o'clock position, medial breast  PROCEDURE:   Procedure(s): RIGHT TOTAL MASTECTOMY WITH RIGHT AXILLARY SENTINEL LYMPH NODE BIOPSY, Injection of methylene blue, deep sentinel lymph node biopsy  SURGEON:   [name], M.D.  ASSISTANT:   None  ANESTHESIA:   general  Anesthesiologist: Lynda Rainwater, MD CRNA: Harden Mo, CRNA; Maness, Kathrin Penner, CRNA  General  ASA:  3  EBL:  100  ml  BLOOD ADMINISTERED: none  DRAINS: 2 19 F blake drains  LOCAL MEDICATIONS USED:   Pectoral block by anesthesia  SPECIMEN:   Right breast (suture lateral), sentinel lymph node biopsy (counts 25, background 0)  COUNTS CORRECT:  YES  INDICATIONS FOR PROCEDURE:  Shawna [name] is a 77 y.o. (DOB: [birth year]) white female whose primary care physician is [name], MD and comes for right mastectomy and right axillary sentinel lymph node biopsy.   She had a right breast lumpectomy on 02/03/2015 and received post op rad tx and herceptin.  She was recently found to have a nodule in the skin at the 3 o'clock position of the breast.  This was very medial in the left breast.  A PET scan showed this to be the only area of activity.  She sees Dr. Lindi Adie and [name] for oncology.   The indications and risks of the surgery were explained to the patient.  The risks include, but are not limited to, infection, bleeding, and nerve injury.  OPERATIVE NOTE;  The patient was taken to room # 1 at [facility] where she underwent a general anesthesia  supervised by Anesthesiologist: Lynda Rainwater, MD CRNA: Harden Mo, CRNA; Maness, Kathrin Penner, CRNA. Her right breast and axilla were prepped with ChloraPrep and sterilely draped.    A time-out and the surgical check list was reviewed.    I injected about 0.5 mL of 40% methylene blue  around her right areola.   I made an elliptical incision including the areola in the right breast.  She had skin changes which involved about 1.0 x 1.5 cm in the very medial right breast breast.  I tried to get at least 1.0 cm around the visible lesion.   I developed skin flaps medially to the lateral edge of the sternum, inferiorly to the investing fascia of the rectus abdominus muscle, laterally to the anterior edge of the latissimus dorsi muscle, and superiorly to about 2 finger breaths below the clavicle.  The breast was reflected off the pectoralis muscle from medial to lateral.  The lateral attachments in the right axilla were divided and the breast removed.  A long suture was placed on the lateral aspect of the breast.   I dissected into the right axilla and found a deep sentinel lymph node.  The node had counts of 25 with a background count of 0.  The lymph node was not blue.  This was sent as a separate specimen.   I brought out 2 68 F Blake drains below the inferior flaps.  These were sewn in place with 2-0 Nylons.  I irrigated the wound with 2,000 cc of fluid.   The skin was closed with interrupted 3-0 Vicryl sutures and the skin was closed with a 4-0 Monocryl.  The wound was painted with Dermabond.    A pressure dressing was placed  on the wound and the chest wrapped with a breast binder.  Her needle and sponge count were correct at the end of the case.   She was transferred to the recovery room in good condition.  [name], MD, [facility] Surgery Pager: [phone] Office phone:  [phone]

## 2017-03-07 NOTE — Anesthesia Preprocedure Evaluation (Addendum)
Anesthesia Evaluation  Patient identified by MRN, date of birth, ID band Patient awake    Reviewed: Allergy & Precautions, NPO status , Patient's Chart, lab work & pertinent test results  Airway Mallampati: II  TM Distance: >3 FB Neck ROM: Full    Dental  (+) Dental Advisory Given, Teeth Intact   Pulmonary neg pulmonary ROS,    breath sounds clear to auscultation       Cardiovascular hypertension, Pt. on medications  Rhythm:Regular Rate:Normal     Neuro/Psych negative neurological ROS     GI/Hepatic negative GI ROS, Neg liver ROS,   Endo/Other  negative endocrine ROS  Renal/GU negative Renal ROS     Musculoskeletal   Abdominal   Peds  Hematology negative hematology ROS (+)   Anesthesia Other Findings Recurrent breast cancer  Reproductive/Obstetrics                             Lab Results  Component Value Date   CREATININE 1.0 01/25/2016   BUN 30.7 (H) 01/25/2016   NA 140 01/25/2016   K 3.8 01/25/2016   CL 108 01/29/2015   CO2 24 01/25/2016    Anesthesia Physical  Anesthesia Plan  ASA: III  Anesthesia Plan: General and Regional   Post-op Pain Management: GA combined w/ Regional for post-op pain   Induction: Intravenous  PONV Risk Score and Plan: 3 and Ondansetron, Dexamethasone and Midazolam  Airway Management Planned: Oral ETT  Additional Equipment:   Intra-op Plan:   Post-operative Plan: Extubation in OR  Informed Consent: I have reviewed the patients History and Physical, chart, labs and discussed the procedure including the risks, benefits and alternatives for the proposed anesthesia with the patient or authorized representative who has indicated his/her understanding and acceptance.   Dental advisory given  Plan Discussed with: CRNA  Anesthesia Plan Comments:         Anesthesia Quick Evaluation

## 2017-03-07 NOTE — Transfer of Care (Signed)
Immediate Anesthesia Transfer of Care Note  Patient: Shawna [name]  Procedure(s) Performed: RIGHT TOTAL MASTECTOMY WITH RIGHT AXILLARY SENTINEL LYMPH NODE BIOPSY (Right Breast)  Patient Location: PACU  Anesthesia Type:General  Level of Consciousness: awake, alert  and oriented  Airway & Oxygen Therapy: Patient Spontanous Breathing  Post-op Assessment: Report given to RN, Post -op Vital signs reviewed and stable and Patient moving all extremities X 4  Post vital signs: Reviewed and stable  Last Vitals:  Vitals:   03/07/17 0632  BP: (!) 171/80  Pulse: 68  Resp: 16  Temp: 36.8 C  SpO2: 100%    Last Pain:  Vitals:   03/07/17 0632  TempSrc: Oral      Patients Stated Pain Goal: 3 (77/41/42 3953)  Complications: No apparent anesthesia complications

## 2017-03-07 NOTE — Interval H&P Note (Signed)
History and Physical Interval Note:  03/07/2017 7:07 AM  Shawna [name]  has presented today for surgery, with the diagnosis of RECURRENT RIGHT BREAST CANCER  The various methods of treatment have been discussed with the patient and family.  Her son and daughter are here with the patient.  After consideration of risks, benefits and other options for treatment, the patient has consented to  Procedure(s): RIGHT MASTECTOMY WITH RIGHT AXILLA SENTINEL LYMPH NODE BIOPSY (Right) as a surgical intervention .  The patient's history has been reviewed, patient examined, no change in status, stable for surgery.  I have reviewed the patient's chart and labs.  Questions were answered to the patient's satisfaction.     [name]

## 2017-03-07 NOTE — Anesthesia Postprocedure Evaluation (Signed)
Anesthesia Post Note  Patient: Shawna [name]  Procedure(s) Performed: RIGHT TOTAL MASTECTOMY WITH RIGHT AXILLARY SENTINEL LYMPH NODE BIOPSY (Right Breast)     Patient location during evaluation: PACU Anesthesia Type: Regional and General Level of consciousness: awake and alert Pain management: pain level controlled Vital Signs Assessment: post-procedure vital signs reviewed and stable Respiratory status: spontaneous breathing, nonlabored ventilation and respiratory function stable Cardiovascular status: blood pressure returned to baseline and stable Postop Assessment: no apparent nausea or vomiting Anesthetic complications: no    Last Vitals:  Vitals:   03/07/17 1019 03/07/17 1024  BP: 124/70   Pulse: 75 79  Resp: 12 (!) 22  Temp:    SpO2: 92% 100%    Last Pain:  Vitals:   03/07/17 1024  TempSrc:   PainSc: 10-Worst pain ever                 Lynda Rainwater

## 2017-03-07 NOTE — Anesthesia Procedure Notes (Signed)
Procedure Name: Intubation Date/Time: 03/07/2017 7:30 AM Performed by: Harden Mo, CRNA Pre-anesthesia Checklist: Patient identified, Emergency Drugs available, Suction available and Patient being monitored Patient Re-evaluated:Patient Re-evaluated prior to induction Oxygen Delivery Method: Circle System Utilized Preoxygenation: Pre-oxygenation with 100% oxygen Induction Type: IV induction Ventilation: Mask ventilation without difficulty Laryngoscope Size: Miller and 2 Grade View: Grade I Tube type: Oral Tube size: 7.5 mm Number of attempts: 1 Airway Equipment and Method: Stylet and Oral airway Placement Confirmation: ETT inserted through vocal cords under direct vision,  positive ETCO2 and breath sounds checked- equal and bilateral Secured at: 22 cm Tube secured with: Tape Dental Injury: Teeth and Oropharynx as per pre-operative assessment

## 2017-03-08 ENCOUNTER — Encounter (HOSPITAL_COMMUNITY): Payer: Self-pay | Admitting: Surgery

## 2017-03-08 ENCOUNTER — Other Ambulatory Visit: Payer: Self-pay

## 2017-03-08 DIAGNOSIS — E7800 Pure hypercholesterolemia, unspecified: Secondary | ICD-10-CM | POA: Diagnosis not present

## 2017-03-08 DIAGNOSIS — Z803 Family history of malignant neoplasm of breast: Secondary | ICD-10-CM | POA: Diagnosis not present

## 2017-03-08 DIAGNOSIS — I10 Essential (primary) hypertension: Secondary | ICD-10-CM | POA: Diagnosis not present

## 2017-03-08 DIAGNOSIS — C50911 Malignant neoplasm of unspecified site of right female breast: Secondary | ICD-10-CM | POA: Diagnosis not present

## 2017-03-08 DIAGNOSIS — I252 Old myocardial infarction: Secondary | ICD-10-CM | POA: Diagnosis not present

## 2017-03-08 DIAGNOSIS — Z87442 Personal history of urinary calculi: Secondary | ICD-10-CM | POA: Diagnosis not present

## 2017-03-08 DIAGNOSIS — Z79899 Other long term (current) drug therapy: Secondary | ICD-10-CM | POA: Diagnosis not present

## 2017-03-08 MED ORDER — HYDROCODONE-ACETAMINOPHEN 5-325 MG PO TABS: 1.0000 | ORAL_TABLET | Freq: Four times a day (QID) | ORAL | 0 refills | Status: DC | PRN

## 2017-03-08 NOTE — Discharge Instructions (Signed)
[facility] - DISCHARGE INSTRUCTIONS TO PATIENT  Activity:  Driving - May drive in 2 or 3 days, if doing well   Lifting - No lifting more than 15 pounds for 7 days, then no limit  Wound Care:   Leave bandage for days, then remove and shower  Diet:  As tolerated  Follow up appointment:  Call Dr. Pollie Friar office [facility]) at [phone] for an appointment in one week  Medications and dosages:  Resume your home medications.  You have a prescription for:  Vicodin  Call Dr. Lucia Gaskins or his office  [phone]) if you have:  Temperature greater than 100.4,  Persistent nausea and vomiting,  Severe uncontrolled pain,  Redness, tenderness, or signs of infection (pain, swelling, redness, odor or green/yellow discharge around the site),  Difficulty breathing, headache or visual disturbances,  Any other questions or concerns you may have after discharge.  In an emergency, call 911 or go to an Emergency Department at a nearby hospital.

## 2017-03-08 NOTE — Discharge Summary (Signed)
Physician Discharge Summary  Patient ID:  Shawna [name]  MRN: [id number]  DOB/AGE: 75/13/1942 75 y.o.  Admit date: 03/07/2017 Discharge date: 03/08/2017  Discharge Diagnoses:   Active Problems:   Recurrent breast cancer, right ([place])   Operation: Procedure(s):  RIGHT TOTAL MASTECTOMY WITH RIGHT AXILLARY SENTINEL LYMPH NODE BIOPSY on 03/07/2017 - [facility]  Discharged Condition: good  Hospital Course: MARYANNA STUBER is an 77 y.o. female whose primary care physician is [name], MD and who was admitted 03/07/2017 with a chief complaint of recurrent right breast cancer.   She was brought to the operating room on 03/07/2017 and underwent  RIGHT TOTAL MASTECTOMY WITH RIGHT AXILLARY SENTINEL LYMPH NODE BIOPSY.   She is now one day post op, doing well, and ready for discharge. Her daughter is at her bedside.  The discharge instructions were reviewed with the patient.  Consults: None  Significant Diagnostic Studies: Results for orders placed or performed during the hospital encounter of 67/12/45  Basic metabolic panel  Result Value Ref Range   Sodium 140 135 - 145 mmol/L   Potassium 3.4 (L) 3.5 - 5.1 mmol/L   Chloride 107 101 - 111 mmol/L   CO2 22 22 - 32 mmol/L   Glucose, Bld 82 65 - 99 mg/dL   BUN 26 (H) 6 - 20 mg/dL   Creatinine, Ser 0.94 0.44 - 1.00 mg/dL   Calcium 9.2 8.9 - 10.3 mg/dL   GFR calc non Af Amer 58 (L) >60 mL/min   GFR calc Af Amer >60 >60 mL/min   Anion gap 11 5 - 15  CBC  Result Value Ref Range   WBC 3.1 (L) 4.0 - 10.5 K/uL   RBC 3.90 3.87 - 5.11 MIL/uL   Hemoglobin 13.3 12.0 - 15.0 g/dL   HCT 38.9 36.0 - 46.0 %   MCV 99.7 78.0 - 100.0 fL   MCH 34.1 (H) 26.0 - 34.0 pg   MCHC 34.2 30.0 - 36.0 g/dL   RDW 13.2 11.5 - 15.5 %   Platelets 169 150 - 400 K/uL    Nm Pet Image Restag (ps) Skull Base To Thigh  Result Date: 02/15/2017 CLINICAL DATA:  Subsequent treatment strategy for right breast cancer. Breast skin metastasis. Stage IV. Recurrence.  Right-sided lumpectomy and sentinel node biopsy 02/03/2015. EXAM: NUCLEAR MEDICINE PET SKULL BASE TO THIGH TECHNIQUE: 6.9 mCi F-18 FDG was injected intravenously. Full-ring PET imaging was performed from the skull base to thigh after the radiotracer. CT data was obtained and used for attenuation correction and anatomic localization. FASTING BLOOD GLUCOSE:  Value: 93 mg/dl COMPARISON:  None. FINDINGS: NECK: No areas of abnormal hypermetabolism. No cervical adenopathy. Bilateral carotid atherosclerosis. CHEST: Hypermetabolic focus of right medial breast/chest wall skin thickening measures 9 mm and a S.U.V. max of 4.3, including on image 36/series 4. No other abnormal thoracic hypermetabolism identified. No axillary adenopathy. Surgical changes in the adjacent medial right breast, including on image 85/series 4. Lad and left circumflex coronary artery atherosclerosis. ABDOMEN/PELVIS: No abdominopelvic parenchymal or nodal hypermetabolism identified. Normal adrenal glands. Left renal sinus cysts. medial upper pole right renal 1.7 cm lesion measures fluid density, likely a cyst. Punctate lower pole left renal collecting system calculus. Abdominal aortic and branch vessel atherosclerosis. Pelvic floor laxity with pessary in place. SKELETON: No abnormal marrow activity. IMPRESSION: 1. Hypermetabolic focuse of skin thickening about the medial right breast/chest wall, consistent with the clinical history of recurrent/metastatic disease. 2. No other sites of hypermetabolic metastasis identified. 3. Coronary artery atherosclerosis.  Aortic Atherosclerosis (ICD10-I70.0). 4. Left nephrolithiasis. Electronically Signed   By: Abigail Miyamoto M.D.   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    Discharge Exam:  Vitals:   03/08/17 0253 03/08/17 0608  BP: 124/64 123/64  Pulse: 79 80  Resp: 16  16  Temp: 97.7 F (36.5 C) 97.7 F (36.5 C)  SpO2: 99% 97%    General: WN older WF who is alert and generally healthy appearing.  Lungs: Clear to auscultation and symmetric breath sounds. Heart:  RRR. No murmur or rub. Chest:  Right mastectomy wound okay.  Drains 1/2 - 80/63 cc recorded  Discharge Medications:   Allergies as of 03/08/2017   No Known Allergies     Medication List    TAKE these medications   CALCIUM PO Take 1,000 mg by mouth daily.   CRESTOR 20 MG tablet Generic drug:  rosuvastatin Take 1 tablet by mouth daily.   hydrochlorothiazide 12.5 MG capsule Commonly known as:  MICROZIDE Take 12.5 mg by mouth daily.   HYDROcodone-acetaminophen 5-325 MG tablet Commonly known as:  NORCO/VICODIN Take 1-2 tablets by mouth every 6 (six) hours as needed for moderate pain.   losartan 100 MG tablet Commonly known as:  COZAAR Take 100 mg by mouth daily.   TRIMO-SAN 0.025 % Gel Generic drug:  OXYQUINOLONE SULFATE VAGINAL Place 1 application vaginally once a week.   Vitamin D (Ergocalciferol) 2000 units Caps Take 1 tablet by mouth daily.       Disposition: 01-Home or Self Care  Discharge Instructions    Diet - low sodium heart healthy   Complete by:  As directed    Increase activity slowly   Complete by:  As directed          Signed: [name], M.D., [facility] Office:  [phone]  03/08/2017, 8:12 AM

## 2017-03-08 NOTE — Progress Notes (Signed)
[name] discharged per MD order. Discussed with the patient and all questions fully answered.  VSS, Skin clean, dry and intact without evidence of skin break down, no evidence of skin tears noted.  IV catheter discontinued intact. Site without signs and symptoms of complications. Dressing and pressure applied.  An After Visit Summary was printed and given to the patient. Patient received prescription.  Discharge education completed with patient/family including follow up instructions, medication list, d/c activities limitations if indicated, with JP drain care with teachback as indicated by MD - patient able to verbalize understanding, all questions fully answered.   Patient instructed to return to ED, call 911, or call MD for any changes in condition.   Patient to be escorted via [place], and D/C home via private auto.

## 2017-03-14 ENCOUNTER — Inpatient Hospital Stay: Payer: Medicare Other | Admitting: Hematology and Oncology

## 2017-03-14 DIAGNOSIS — C50311 Malignant neoplasm of lower-inner quadrant of right female breast: Secondary | ICD-10-CM | POA: Diagnosis not present

## 2017-03-14 DIAGNOSIS — C792 Secondary malignant neoplasm of skin: Secondary | ICD-10-CM

## 2017-03-14 DIAGNOSIS — Z171 Estrogen receptor negative status [ER-]: Secondary | ICD-10-CM

## 2017-03-14 NOTE — Progress Notes (Signed)
Patient Care Team: [name], MD as PCP - General (Internal Medicine) [name], MD as Consulting Physician (General Surgery) [name], MD as Consulting Physician (Hematology and Oncology) Delice Bison Charlestine Massed, NP as Nurse Practitioner (Hematology and Oncology)  DIAGNOSIS:  Encounter Diagnosis  Name Primary?  . 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 Genes tested include:  ATM, BARD1, BRCA1, BRCA2, BRIP1, CDH1, CHEK2, EPCAM, FANCC, MLH1, MSH2, MSH6, NBN, PALB2, PMS2, PTEN, RAD51C, RAD51D, TP53, and XRCC2.      02/03/2015 Surgery    Right lumpectomy: Invasive ductal carcinoma grade 2, 2.1 cm, with associated DCIS, margins are negative, 0/1 lymph node, ER 0%, PR 0%, HER-2 positive ratio 2.31, T2 N0 stage II a      03/09/2015 - 06/22/2015 Chemotherapy    Adjuvant chemotherapy with [place] 6 followed by Herceptin maintenance for 1 year      08/11/2015 - 09/23/2015 Radiation Therapy    Adjuvant radiation therapy [name]):  1) Right Breast / 50 Gy in 25 fractions ; 2) Right Breast Boost / 10 Gy in 5 fractions      01/30/2017 Relapse/Recurrence    Skin biopsy right breast: Metastatic breast cancer GCDFP strongly positive      02/15/2017 PET scan    Hypermetabolic focus of skin thickening along the medial right breast/chest wall consistent with recurrent disease, no other hypermetabolic metastases identified.      03/07/2017 Surgery    Right  mastectomy: IDC grade 3, 2 foci spanning 1.2 cm and 1.1 cm, lymphovascular invasion is present including dermal lymphatics, margins negative, 0/2 lymph nodes negative, ER 0%, PR 0%, HER-2 negative, Ki-67 not done,pT4b (skin involvement) N0 stage IIIc       CHIEF COMPLIANT: Follow-up after right mastectomy  INTERVAL HISTORY: Shawna [name] is a 77 year old with above-mentioned history of recurrent right breast cancer underwent mastectomy and is here today to discuss the pathology report.  She is recovering fairly well from the surgery.  She has couple of drains in place.  She is accompanied by her daughter today.  REVIEW OF SYSTEMS:   Constitutional: Denies fevers, chills or abnormal weight loss Eyes: Denies blurriness of vision Ears, nose, mouth, throat, and face: Denies mucositis or sore throat Respiratory: Denies cough, dyspnea or wheezes Cardiovascular: Denies palpitation, chest discomfort Gastrointestinal:  Denies nausea, heartburn or change in bowel habits Skin: Denies abnormal skin rashes Lymphatics: Denies new lymphadenopathy or easy bruising Neurological:Denies numbness, tingling or new weaknesses Behavioral/Psych: Mood is stable, no new changes  Extremities: No lower extremity edema Breast: Recent right mastectomy All other systems were reviewed with the patient and are negative.  I have reviewed the past medical history, past surgical history, social history and family history with the patient and they are unchanged from previous note.  ALLERGIES:  has No Known Allergies.  MEDICATIONS:  Current Outpatient Medications  Medication Sig Dispense Refill  . CALCIUM PO Take 1,000  mg by mouth daily.    . CRESTOR 20 MG tablet Take 1 tablet by mouth daily.  0  . hydrochlorothiazide (MICROZIDE) 12.5 MG capsule Take 12.5 mg by mouth daily.     Marland Kitchen HYDROcodone-acetaminophen (NORCO/VICODIN) 5-325 MG tablet Take 1-2 tablets by mouth every 6 (six) hours as needed for moderate pain. 20  tablet 0  . losartan (COZAAR) 100 MG tablet Take 100 mg by mouth daily.  0  . OXYQUINOLONE SULFATE VAGINAL (TRIMO-SAN) 0.025 % GEL Place 1 application vaginally once a week.    . Vitamin D, Ergocalciferol, 2000 units CAPS Take 1 tablet by mouth daily.      No current facility-administered medications for this visit.     PHYSICAL EXAMINATION: ECOG PERFORMANCE STATUS: 1 - Symptomatic but completely ambulatory  Vitals:   03/14/17 1449  BP: 133/67  Pulse: 73  Resp: 17  Temp: 98.4 F (36.9 C)  SpO2: 100%   Filed Weights   03/14/17 1449  Weight: 140 lb 6.4 oz (63.7 kg)    GENERAL:alert, no distress and comfortable SKIN: skin color, texture, turgor are normal, no rashes or significant lesions EYES: normal, Conjunctiva are pink and non-injected, sclera clear OROPHARYNX:no exudate, no erythema and lips, buccal mucosa, and tongue normal  NECK: supple, thyroid normal size, non-tender, without nodularity LYMPH:  no palpable lymphadenopathy in the cervical, axillary or inguinal LUNGS: clear to auscultation and percussion with normal breathing effort HEART: regular rate & rhythm and no murmurs and no lower extremity edema ABDOMEN:abdomen soft, non-tender and normal bowel sounds MUSCULOSKELETAL:no cyanosis of digits and no clubbing  NEURO: alert & oriented x 3 with fluent speech, no focal motor/sensory deficits EXTREMITIES: No lower extremity edema  LABORATORY DATA:  I have reviewed the data as listed CMP Latest Ref Rng & Units 03/07/2017 01/25/2016 12/14/2015  Glucose 65 - 99 mg/dL 82 79 93  BUN 6 - 20 mg/dL 26(H) 30.7(H) 25.7  Creatinine 0.44 - 1.00 mg/dL 0.94 1.0 0.9  Sodium 135 - 145 mmol/L 140 140 143  Potassium 3.5 - 5.1 mmol/L 3.4(L) 3.8 4.1  Chloride 101 - 111 mmol/L 107 - -  CO2 22 - 32 mmol/L '22 24 25  ' Calcium 8.9 - 10.3 mg/dL 9.2 9.8 9.3  Total Protein 6.4 - 8.3 g/dL - 6.5 6.3(L)  Total Bilirubin 0.20 - 1.20 mg/dL - 0.69 0.66  Alkaline Phos 40 - 150 U/L - 113 142  AST 5 -  34 U/L - 33 55(H)  ALT 0 - 55 U/L - 33 76(H)    Lab Results  Component Value Date   WBC 3.1 (L) 03/07/2017   HGB 13.3 03/07/2017   HCT 38.9 03/07/2017   MCV 99.7 03/07/2017   PLT 169 03/07/2017   NEUTROABS 2.8 01/25/2016    ASSESSMENT & PLAN:  Breast cancer of lower-inner quadrant of right female breast ([place]) 1. Right lumpectomy 02/03/2015: Invasive ductal carcinoma grade 2, 2.1 cm, with associated DCIS, margins are negative, 0/1 lymph node, ER 0%, PR 0%, HER-2 positiveratio 2.31, T2 N0 stage II a. (Right breast biopsy 3:30 position 12/09/2014: Invasive ductal carcinoma grade 3, ER 0%, PR 0%, HER-2 negativeratio 1.5, Ki-67 90%, 1 cm irregular mass T1C N0 stage IA clinical stage) Genetic counseling revealed NBN mutation  2. Adjuvant chemotherapy with [place] 6 cycles started 03/09/15 completed 06/22/15 tookHerceptin maintenance for 1 year until 02/15/2016 3. Followed by radiation therapy 08/11/2015 to09/05/2015 4. Cutaneous recurrence: 03/07/2017: Right mastectomy: IDC grade 3, 2 foci spanning 1.2 cm and 1.1 cm, lymphovascular  invasion is present including dermal lymphatics, margins negative, 0/2 lymph nodes negative, ER 0%, PR 0%, HER-2 negative, Ki-67 not done,pT4b (skin involvement) N0 stage IIIc PET/CT 02/16/2017: No metastatic disease ----------------------------------------------------------------------------------------------- Pathology counseling: I discussed the final pathology report of the patient provided  a copy of this report. I discussed the margins as well as lymph node surgeries. We also discussed the final staging along with previously performed ER/PR and HER-2/neu testing.  The recurrent tumor is HER-2 negative.  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  Patient decided not to receive any further systemic chemotherapy. There is no role of antiestrogen therapy because she is triple negative. We will continue our surveillance plan and follow-up in 6 months.   I spent 25 minutes talking to  the patient of which more than half was spent in counseling and coordination of care.  No orders of the defined types were placed in this encounter.  The patient has a good understanding of the overall plan. she agrees with it. she will call with any problems that may develop before the next visit here.   Harriette Ohara, MD 03/14/17

## 2017-03-14 NOTE — Assessment & Plan Note (Signed)
1. Right lumpectomy 02/03/2015: Invasive ductal carcinoma grade 2, 2.1 cm, with associated DCIS, margins are negative, 0/1 lymph node, ER 0%, PR 0%, HER-2 positiveratio 2.31, T2 N0 stage II a. (Right breast biopsy 3:30 position 12/09/2014: Invasive ductal carcinoma grade 3, ER 0%, PR 0%, HER-2 negativeratio 1.5, Ki-67 90%, 1 cm irregular mass T1C N0 stage IA clinical stage) Genetic counseling revealed NBN mutation  2. Adjuvant chemotherapy with [place] 6 cycles started 03/09/15 completed 06/22/15 tookHerceptin maintenance for 1 year until 02/15/2016 3. Followed by radiation therapy 08/11/2015 to09/05/2015 4. Cutaneous recurrence: 03/07/2017: Right mastectomy: IDC grade 3, 2 foci spanning 1.2 cm and 1.1 cm, lymphovascular invasion is present including dermal lymphatics, margins negative, 0/2 lymph nodes negative, ER 0%, PR 0%, HER-2 negative, Ki-67 not done,pT4b (skin involvement) N0 stage IIIc PET/CT 02/16/2017: No metastatic disease ----------------------------------------------------------------------------------------------- Pathology counseling: I discussed the final pathology report of the patient provided  a copy of this report. I discussed the margins as well as lymph node surgeries. We also discussed the final staging along with previously performed ER/PR and HER-2/neu testing.  The recurrent tumor is HER-2 negative.  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  Patient decided not to receive any further systemic chemotherapy. There is no role of antiestrogen therapy because she is triple negative. We will continue our surveillance plan and follow-up in 6 months.

## 2017-03-15 ENCOUNTER — Ambulatory Visit: Payer: Medicare Other | Admitting: Nurse Practitioner

## 2017-03-16 ENCOUNTER — Ambulatory Visit: Payer: Medicare Other | Admitting: Obstetrics and Gynecology

## 2017-03-23 DIAGNOSIS — C50911 Malignant neoplasm of unspecified site of right female breast: Secondary | ICD-10-CM | POA: Diagnosis not present

## 2017-04-19 DIAGNOSIS — C50911 Malignant neoplasm of unspecified site of right female breast: Secondary | ICD-10-CM | POA: Diagnosis not present

## 2017-04-27 ENCOUNTER — Ambulatory Visit (INDEPENDENT_AMBULATORY_CARE_PROVIDER_SITE_OTHER): Payer: Medicare Other | Admitting: Obstetrics and Gynecology

## 2017-04-27 ENCOUNTER — Other Ambulatory Visit: Payer: Self-pay

## 2017-04-27 ENCOUNTER — Encounter: Payer: Self-pay | Admitting: Obstetrics and Gynecology

## 2017-04-27 VITALS — BP 132/70 | HR 64 | Resp 14 | Ht 68.0 in | Wt 141.0 lb

## 2017-04-27 DIAGNOSIS — N898 Other specified noninflammatory disorders of vagina: Secondary | ICD-10-CM

## 2017-04-27 DIAGNOSIS — T8389XA Other specified complication of genitourinary prosthetic devices, implants and grafts, initial encounter: Secondary | ICD-10-CM

## 2017-04-27 DIAGNOSIS — N899 Noninflammatory disorder of vagina, unspecified: Secondary | ICD-10-CM | POA: Diagnosis not present

## 2017-04-27 DIAGNOSIS — Z8739 Personal history of other diseases of the musculoskeletal system and connective tissue: Secondary | ICD-10-CM

## 2017-04-27 DIAGNOSIS — Z0000 Encounter for general adult medical examination without abnormal findings: Secondary | ICD-10-CM

## 2017-04-27 DIAGNOSIS — Z01419 Encounter for gynecological examination (general) (routine) without abnormal findings: Secondary | ICD-10-CM

## 2017-04-27 DIAGNOSIS — Z4689 Encounter for fitting and adjustment of other specified devices: Secondary | ICD-10-CM | POA: Diagnosis not present

## 2017-04-27 DIAGNOSIS — Z853 Personal history of malignant neoplasm of breast: Secondary | ICD-10-CM | POA: Diagnosis not present

## 2017-04-27 NOTE — Progress Notes (Signed)
77 y.o. [id number] WidowedCaucasianF here for annual exam.   She has genital prolapse that is well controlled with a #5 ring pessary with support. She uses trimosan cream about one x a week, lately she has been forgetting it.   She has a h/o right breast cancer had a cutaneousrecurrence  in 1/19 and a right mastectomy in 2/19. 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 She met with the plastic surgeon about reconstruction and she decided against it.  She is still sore in the axillary area.   She is having some soreness in her groin to her right hip since the beginning of the year. Feels some weakness in her leg, seems a little better. Walking better.     Patient's last menstrual period was 01/17/1982 (approximate).          Sexually active: No.  The current method of family planning is post menopausal status.    Exercising: No.  The patient does not participate in regular exercise at present. Smoker:  no  Health Maintenance: Pap:  02-24-15 WNL  History of abnormal Pap:  no MMG:  12-29-16 WNL- however she was DX with breast 02/2017. Left breast mastectomy 03-07-17  Colonoscopy:  08-12-16 polyps, was not told she needed it repeated.   BMD:   05-07-14 Osteoporosis, she was on reclast, stopped it last year with her primary. Primary checked it in her office in 4/18. She will f/u with him.  TDaP:  10-13-11 Gardasil: N/A   reports that she has never smoked. She has never used smokeless tobacco. She reports that she drinks about 4.2 oz of alcohol per week. She reports that she does not use drugs.She is having one glass of red wine with dinner. Son in [place], Daughter in [place]. She  has 4 step-grandchildren   Past Medical History:  Diagnosis Date  . Breast cancer ([place]) 11/2014  . Family history of breast cancer   . Family history of colon cancer   . History of kidney stones   . History of radiation therapy 08/11/15- 09/23/2015   Right Breast  . Hyperlipidemia   . Hypertension   . Monoallelic mutation of NBN gene   . Osteoporosis   . Skin cancer of nose     Past Surgical History:  Procedure Laterality Date  . APPENDECTOMY    . BREAST LUMPECTOMY WITH RADIOACTIVE SEED AND SENTINEL LYMPH NODE BIOPSY Right 02/03/2015   Procedure: RIGHT BREAST LUMPECTOMY WITH RADIOACTIVE SEED AND RIGHT SENTINEL LYMPH NODE BIOPSY;  Surgeon: [name], MD;  Location: [facility];  Service: General;  Laterality: Right;  . BREAST SURGERY  11/2012   biopsy, benign breast tissue  . COLONOSCOPY  06/2010   rec  . LITHOTRIPSY     kidney stone on left side  . MASTECTOMY W/ SENTINEL NODE BIOPSY Right 03/07/2017   Procedure: RIGHT TOTAL MASTECTOMY WITH RIGHT AXILLARY SENTINEL LYMPH NODE BIOPSY;  Surgeon: [name], MD;  Location: [facility];  Service: General;  Laterality: Right;  . OVARIAN CYST REMOVAL  age 89  . POLYPECTOMY  04/2005   colon  . PORTACATH PLACEMENT Left 02/03/2015   Procedure: INSERTION PORT-A-CATH WITH ULTRA SOUND GUIDANCE;  Surgeon: [name], MD;  Location: [facility];  Service: General;  Laterality: Left;    Current Outpatient Medications  Medication Sig Dispense Refill  . CALCIUM PO Take 1,000 mg by mouth daily.    . CRESTOR 20 MG tablet Take 1 tablet by mouth daily.  0  . hydrochlorothiazide (MICROZIDE) 12.5 MG capsule Take 12.5 mg by mouth daily.     Marland Kitchen losartan (COZAAR) 100 MG tablet Take 100 mg by mouth daily.  0  . OXYQUINOLONE SULFATE VAGINAL (TRIMO-SAN) 0.025 % GEL Place 1 application vaginally once a week.    . Vitamin D, Ergocalciferol, 2000 units CAPS Take 1 tablet by mouth daily.      No current facility-administered  medications for this visit.     Family History  Problem Relation Age of Onset  . Colon cancer Sister 26       died at 35  . Breast cancer Mother 7  . Hypertension Mother   . Osteoporosis Mother   . Heart disease Father        CABG  . Dementia Father   . Throat cancer Brother 39  . Breast cancer Sister 63  . Lymphoma Sister 4  . Melanoma Sister   . Skin cancer Brother   . Skin cancer Daughter     Review of Systems  Constitutional: Negative.   HENT: Negative.   Eyes: Negative.   Respiratory: Negative.   Cardiovascular: Negative.   Gastrointestinal: Negative.   Endocrine: Negative.   Genitourinary: Positive for dysuria and urgency.       Right sided pelvic pain   Musculoskeletal: Positive for myalgias.  Skin: Negative.   Allergic/Immunologic: Negative.   Neurological: Negative.   Psychiatric/Behavioral: Negative.     Exam:   BP 132/70 (BP Location: Right Arm, Patient Position: Sitting, Cuff Size: Normal)   Pulse 64   Resp 14   Ht '5\' 8"'  (1.727 m)   Wt 141 lb (64 kg)   LMP 01/17/1982 (Approximate)   BMI 21.44 kg/m   Weight change: '@WEIGHTCHANGE' @ Height:   Height: '5\' 8"'  (172.7 cm)  Ht Readings from Last 3 Encounters:  04/27/17 '5\' 8"'  (1.727 m)  03/14/17 5' 8.5" (1.74 m)  03/07/17 5' 8.5" (1.74 m)    General appearance: alert, cooperative and appears stated age Head: Normocephalic, without obvious abnormality, atraumatic Neck: no adenopathy, supple, symmetrical, trachea midline and thyroid normal to inspection and palpation Lungs: clear to auscultation bilaterally Cardiovascular: regular rate and rhythm Breasts: right mastectomy site is healing. No left breast lumps or skin changes.  Abdomen: soft, non-tender; non distended,  no masses,  no organomegaly Extremities: extremities normal, atraumatic, no cyanosis or edema Skin: Skin color, texture, turgor normal. No rashes or lesions Lymph nodes: Cervical, supraclavicular, and axillary nodes normal. No abnormal  inguinal nodes palpated Neurologic: Grossly normal   Pelvic: External genitalia:  no lesions              Urethra:  normal appearing urethra with no masses, tenderness or lesions              Bartholins and Skenes: normal                 Vagina: the pessary was removed and cleaned, she has a thick, clumpy white vaginal d/c. Some mild erythema in the posterior vagina. The pessary was replaced.               Cervix: no lesions               Bimanual  Exam:  Uterus:  normal size, contour, position, consistency, mobility, non-tender              Adnexa: no mass, fullness, tenderness               Rectovaginal: Confirms               Anus:  normal sphincter tone, no lesions  Chaperone was present for exam.  A:  Well Woman with normal exam  Breast cancer  Genital prolapse, well controlled with the pessary  Vaginal discharge and some vaginal irritation  P:   No pap this year  Mammogram and colonoscopy UTD  Discussed breast self exam  Discussed calcium and vit D intake  F/U with primary for osteoporosis  Labs with primary  Affirm sent, will treat if +  She will use the trimosan weekly and f/u in 6 weeks for a pessary check

## 2017-04-27 NOTE — Patient Instructions (Signed)

## 2017-04-28 LAB — VAGINITIS/VAGINOSIS, DNA PROBE
Candida Species: NEGATIVE
GARDNERELLA VAGINALIS: NEGATIVE
TRICHOMONAS VAG: NEGATIVE

## 2017-05-01 DIAGNOSIS — I10 Essential (primary) hypertension: Secondary | ICD-10-CM | POA: Diagnosis not present

## 2017-05-01 DIAGNOSIS — E7849 Other hyperlipidemia: Secondary | ICD-10-CM | POA: Diagnosis not present

## 2017-05-01 DIAGNOSIS — M25551 Pain in right hip: Secondary | ICD-10-CM | POA: Diagnosis not present

## 2017-05-01 DIAGNOSIS — C50311 Malignant neoplasm of lower-inner quadrant of right female breast: Secondary | ICD-10-CM | POA: Diagnosis not present

## 2017-05-04 DIAGNOSIS — C50911 Malignant neoplasm of unspecified site of right female breast: Secondary | ICD-10-CM | POA: Diagnosis not present

## 2017-05-18 DIAGNOSIS — M25551 Pain in right hip: Secondary | ICD-10-CM | POA: Diagnosis not present

## 2017-06-08 ENCOUNTER — Ambulatory Visit: Payer: Medicare Other | Admitting: Obstetrics and Gynecology

## 2017-06-08 ENCOUNTER — Encounter: Payer: Self-pay | Admitting: Obstetrics and Gynecology

## 2017-06-08 ENCOUNTER — Other Ambulatory Visit: Payer: Self-pay

## 2017-06-08 VITALS — BP 120/64 | HR 70 | Ht 68.0 in | Wt 143.0 lb

## 2017-06-08 DIAGNOSIS — Z4689 Encounter for fitting and adjustment of other specified devices: Secondary | ICD-10-CM | POA: Diagnosis not present

## 2017-06-08 NOTE — Progress Notes (Signed)
GYNECOLOGY  VISIT   HPI: 77 y.o.   Widowed  Caucasian  female   G2P2002 with Patient's last menstrual period was 01/17/1982 (approximate).   here for 6 week pessary check. Prolapse is well controled with a #5 ring pessary with support. At her annual exam 6 weeks ago she was noted to have some posterior vaginal irritation from the pessary. She increased her use of the trimosan to weekly and is here for f/u. She can't get the pessary out herself. No vaginal d/c or bleeding.   GYNECOLOGIC HISTORY: Patient's last menstrual period was 01/17/1982 (approximate). Contraception:Postmenopausal Menopausal hormone therapy: none        OB History    Gravida  2   Para  2   Term  2   Preterm  0   AB  0   Living  2     SAB  0   TAB  0   Ectopic  0   Multiple  0   Live Births  2              Patient Active Problem List   Diagnosis Date Noted  . Recurrent breast cancer, right ([place]) 03/07/2017  . Monoallelic mutation of NBN gene   . Genetic testing 01/13/2015  . Family history of breast cancer   . Family history of colon cancer   . Breast cancer of lower-inner quadrant of right female breast ([place]) 12/30/2014    Past Medical History:  Diagnosis Date  . Breast cancer ([place]) 11/2014  . Family history of breast cancer   . Family history of colon cancer   . History of kidney stones   . History of radiation therapy 08/11/15- 09/23/2015   Right Breast  . Hyperlipidemia   . Hypertension   . Monoallelic mutation of NBN gene   . Osteoporosis   . Skin cancer of nose     Past Surgical History:  Procedure Laterality Date  . APPENDECTOMY    . BREAST LUMPECTOMY WITH RADIOACTIVE SEED AND SENTINEL LYMPH NODE BIOPSY Right 02/03/2015   Procedure: RIGHT BREAST LUMPECTOMY WITH RADIOACTIVE SEED AND RIGHT SENTINEL LYMPH NODE BIOPSY;  Surgeon: [name], MD;  Location: [facility];  Service: General;  Laterality: Right;  . BREAST SURGERY  11/2012   biopsy, benign breast  tissue  . COLONOSCOPY  06/2010   rec  . LITHOTRIPSY     kidney stone on left side  . MASTECTOMY W/ SENTINEL NODE BIOPSY Right 03/07/2017   Procedure: RIGHT TOTAL MASTECTOMY WITH RIGHT AXILLARY SENTINEL LYMPH NODE BIOPSY;  Surgeon: [name], MD;  Location: [facility];  Service: General;  Laterality: Right;  . OVARIAN CYST REMOVAL  age 64  . POLYPECTOMY  04/2005   colon  . PORTACATH PLACEMENT Left 02/03/2015   Procedure: INSERTION PORT-A-CATH WITH ULTRA SOUND GUIDANCE;  Surgeon: [name], MD;  Location: [facility];  Service: General;  Laterality: Left;    Current Outpatient Medications  Medication Sig Dispense Refill  . CALCIUM PO Take 1,000 mg by mouth daily.    . CRESTOR 20 MG tablet Take 1 tablet by mouth daily.  0  . hydrochlorothiazide (MICROZIDE) 12.5 MG capsule Take 12.5 mg by mouth daily.     Marland Kitchen losartan (COZAAR) 100 MG tablet Take 100 mg by mouth daily.  0  . OXYQUINOLONE SULFATE VAGINAL (TRIMO-SAN) 0.025 % GEL Place 1 application vaginally once a week.    . Vitamin D, Ergocalciferol, 2000 units CAPS Take 1 tablet by  mouth daily.      No current facility-administered medications for this visit.      ALLERGIES: Patient has no known allergies.  Family History  Problem Relation Age of Onset  . Colon cancer Sister 57       died at 78  . Breast cancer Mother 66  . Hypertension Mother   . Osteoporosis Mother   . Heart disease Father        CABG  . Dementia Father   . Throat cancer Brother 80  . Breast cancer Sister 18  . Lymphoma Sister 42  . Melanoma Sister   . Skin cancer Brother   . Skin cancer Daughter     Social History   Socioeconomic History  . Marital status: Widowed    Spouse name: Not on file  . Number of children: 2  . Years of education: Not on file  . Highest education level: Not on file  Occupational History  . Not on file  Social Needs  . Financial resource strain: Not on file  . Food insecurity:    Worry: Not on file     Inability: Not on file  . Transportation needs:    Medical: Not on file    Non-medical: Not on file  Tobacco Use  . Smoking status: Never Smoker  . Smokeless tobacco: Never Used  Substance and Sexual Activity  . Alcohol use: Yes    Alcohol/week: 4.2 oz    Types: 7 Glasses of wine per week    Comment: 6-7 glasses of wine a week  . Drug use: No  . Sexual activity: Not Currently    Partners: Male    Birth control/protection: Post-menopausal  Lifestyle  . Physical activity:    Days per week: Not on file    Minutes per session: Not on file  . Stress: Not on file  Relationships  . Social connections:    Talks on phone: Not on file    Gets together: Not on file    Attends religious service: Not on file    Active member of club or organization: Not on file    Attends meetings of clubs or organizations: Not on file    Relationship status: Not on file  . Intimate partner violence:    Fear of current or ex partner: Not on file    Emotionally abused: Not on file    Physically abused: Not on file    Forced sexual activity: Not on file  Other Topics Concern  . Not on file  Social History Narrative  . Not on file    Review of Systems  Constitutional: Negative.   HENT: Negative.   Eyes: Negative.   Respiratory: Negative.   Cardiovascular: Negative.   Gastrointestinal: Negative.   Genitourinary: Negative.   Skin: Negative.   Endo/Heme/Allergies: Negative.   Psychiatric/Behavioral: Negative.     PHYSICAL EXAMINATION:    BP 120/64 (BP Location: Right Arm, Patient Position: Sitting, Cuff Size: Normal)   Pulse 70   Ht 5\' 8"  (1.727 m)   Wt 143 lb (64.9 kg)   LMP 01/17/1982 (Approximate)   BMI 21.74 kg/m     General appearance: alert, cooperative and appears stated age  Pelvic: External genitalia:  no lesions              Urethra:  normal appearing urethra with no masses, tenderness or lesions              Bartholins and Skenes: normal  Vagina: the pessary  was removed and cleaned. No vaginal erosions or significant erythema. + atrophy.               Cervix: no lesions              Chaperone was present for exam.  ASSESSMENT Pessary check, vaginal irritation has improved with more frequent use of trimosan cream. Doing well    PLAN F/U in 3 months Call with any concerns   An After Visit Summary was printed and given to the patient.

## 2017-07-06 DIAGNOSIS — M25551 Pain in right hip: Secondary | ICD-10-CM | POA: Diagnosis not present

## 2017-07-06 DIAGNOSIS — M7061 Trochanteric bursitis, right hip: Secondary | ICD-10-CM | POA: Diagnosis not present

## 2017-07-06 DIAGNOSIS — G8928 Other chronic postprocedural pain: Secondary | ICD-10-CM | POA: Insufficient documentation

## 2017-07-06 DIAGNOSIS — Z96643 Presence of artificial hip joint, bilateral: Secondary | ICD-10-CM | POA: Insufficient documentation

## 2017-07-06 DIAGNOSIS — M25559 Pain in unspecified hip: Secondary | ICD-10-CM

## 2017-07-18 DIAGNOSIS — M25551 Pain in right hip: Secondary | ICD-10-CM | POA: Diagnosis not present

## 2017-07-18 DIAGNOSIS — M25552 Pain in left hip: Secondary | ICD-10-CM | POA: Diagnosis not present

## 2017-07-25 ENCOUNTER — Inpatient Hospital Stay: Payer: Medicare Other | Attending: Hematology and Oncology | Admitting: Hematology and Oncology

## 2017-07-25 ENCOUNTER — Telehealth: Payer: Self-pay | Admitting: Hematology and Oncology

## 2017-07-25 DIAGNOSIS — Z171 Estrogen receptor negative status [ER-]: Secondary | ICD-10-CM

## 2017-07-25 DIAGNOSIS — Z853 Personal history of malignant neoplasm of breast: Secondary | ICD-10-CM | POA: Insufficient documentation

## 2017-07-25 DIAGNOSIS — M25552 Pain in left hip: Secondary | ICD-10-CM | POA: Diagnosis not present

## 2017-07-25 DIAGNOSIS — M25551 Pain in right hip: Secondary | ICD-10-CM | POA: Diagnosis not present

## 2017-07-25 DIAGNOSIS — C50311 Malignant neoplasm of lower-inner quadrant of right female breast: Secondary | ICD-10-CM

## 2017-07-25 NOTE — Assessment & Plan Note (Signed)
1. Right lumpectomy 02/03/2015: Invasive ductal carcinoma grade 2, 2.1 cm, with associated DCIS, margins are negative, 0/1 lymph node, ER 0%, PR 0%, HER-2 positiveratio 2.31, T2 N0 stage II a. (Right breast biopsy 3:30 position 12/09/2014: Invasive ductal carcinoma grade 3, ER 0%, PR 0%, HER-2 negativeratio 1.5, Ki-67 90%, 1 cm irregular mass T1C N0 stage IA clinical stage) Genetic counseling revealed NBN mutation  2. Adjuvant chemotherapy with [place] 6 cycles started 03/09/15 completed 06/22/15 tookHerceptin maintenance for 1 year until 02/15/2016 3. Followed by radiation therapy 08/11/2015 to09/05/2015 4. Cutaneous recurrence: 03/07/2017: Right mastectomy: IDC grade 3, 2 foci spanning 1.2 cm and 1.1 cm, lymphovascular invasion is present including dermal lymphatics, margins negative, 0/2 lymph nodes negative, ER 0%, PR 0%, HER-2 negative, Ki-67 not done,pT4b (skin involvement) N0 stage IIIc PET/CT 02/16/2017: No metastatic disease -----------------------------------------------------------------------------------------------  Patient decided not to receive any further systemic chemotherapy. There is no role of antiestrogen therapy because she is triple negative.  Surveillance: 1. Breast Exam: 07/25/17 2. Mammograms to be done Dec 2019 We will continue our surveillance plan and follow-up in 1 year.

## 2017-07-25 NOTE — Telephone Encounter (Signed)
Gave avs and calendar ° °

## 2017-07-25 NOTE — Progress Notes (Signed)
Patient Care Team: [name], MD as PCP - General (Internal Medicine) [name], MD as Consulting Physician (General Surgery) [name], MD as Consulting Physician (Hematology and Oncology) Delice Bison Charlestine Massed, NP as Nurse Practitioner (Hematology and Oncology)  DIAGNOSIS:  Encounter Diagnosis  Name Primary?  . 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 Genes tested include:  ATM, BARD1, BRCA1, BRCA2, BRIP1, CDH1, CHEK2, EPCAM, FANCC, MLH1, MSH2, MSH6, NBN, PALB2, PMS2, PTEN, RAD51C, RAD51D, TP53, and XRCC2.      02/03/2015 Surgery    Right lumpectomy: Invasive ductal carcinoma grade 2, 2.1 cm, with associated DCIS, margins are negative, 0/1 lymph node, ER 0%, PR 0%, HER-2 positive ratio 2.31, T2 N0 stage II a      03/09/2015 - 06/22/2015 Chemotherapy    Adjuvant chemotherapy with [place] 6 followed by Herceptin maintenance for 1 year      08/11/2015 - 09/23/2015 Radiation Therapy    Adjuvant radiation therapy [name]):  1) Right Breast / 50 Gy in 25 fractions ; 2) Right Breast Boost / 10 Gy in 5 fractions      01/30/2017 Relapse/Recurrence    Skin biopsy right breast: Metastatic breast cancer GCDFP strongly positive      02/15/2017 PET scan    Hypermetabolic focus of skin thickening along the medial right breast/chest wall consistent with recurrent disease, no other hypermetabolic metastases identified.      03/07/2017 Surgery    Right  mastectomy: IDC grade 3, 2 foci spanning 1.2 cm and 1.1 cm, lymphovascular invasion is present including dermal lymphatics, margins negative, 0/2 lymph nodes negative, ER 0%, PR 0%, HER-2 negative, Ki-67 not done,pT4b (skin involvement) N0 stage IIIc       CHIEF COMPLIANT: Follow-up of history of breast cancer  INTERVAL HISTORY: Shawna [name] is a 77 year old with above-mentioned history of right breast cancer who underwent right mastectomy because of recurrence of breast cancer on the skin of the right breast.  She did not want to go on any further chemotherapy afterwards.  She is currently on observation.  She continues to have some pain and discomfort but otherwise doing quite well.  She is going to bursitis in her right hip which is bothering her.  REVIEW OF SYSTEMS:   Constitutional: Denies fevers, chills or abnormal weight loss Eyes: Denies blurriness of vision Ears, nose, mouth, throat, and face: Denies mucositis or sore throat Respiratory: Denies cough, dyspnea or wheezes Cardiovascular: Denies palpitation, chest discomfort Gastrointestinal:  Denies nausea, heartburn or change in bowel habits Skin: Denies abnormal skin rashes Lymphatics: Denies new lymphadenopathy or easy bruising Neurological:Denies numbness, tingling or new weaknesses Behavioral/Psych: Mood is stable, no new changes  Extremities: Right hip bursitis Breast:  denies any pain or lumps or nodules in either breasts All other systems were reviewed with the patient and are negative.  I have reviewed the past medical history, past surgical history, social history and family  history with the patient and they are unchanged from previous note.  ALLERGIES:  has No Known Allergies.  MEDICATIONS:  Current Outpatient Medications  Medication Sig Dispense Refill  . CALCIUM PO Take 1,000 mg by mouth daily.    . CRESTOR 20 MG tablet Take 1 tablet by mouth daily.  0  . hydrochlorothiazide (MICROZIDE) 12.5 MG capsule Take  12.5 mg by mouth daily.     Marland Kitchen losartan (COZAAR) 100 MG tablet Take 100 mg by mouth daily.  0  . OXYQUINOLONE SULFATE VAGINAL (TRIMO-SAN) 0.025 % GEL Place 1 application vaginally once a week.    . Vitamin D, Ergocalciferol, 2000 units CAPS Take 1 tablet by mouth daily.      No current facility-administered medications for this visit.     PHYSICAL EXAMINATION: ECOG PERFORMANCE STATUS: 1 - Symptomatic but completely ambulatory  Vitals:   07/25/17 1025  BP: (!) 152/84  Pulse: 64  Resp: 19  Temp: 98 F (36.7 C)  SpO2: 100%   Filed Weights   07/25/17 1025  Weight: 145 lb 3.2 oz (65.9 kg)    GENERAL:alert, no distress and comfortable SKIN: skin color, texture, turgor are normal, no rashes or significant lesions EYES: normal, Conjunctiva are pink and non-injected, sclera clear OROPHARYNX:no exudate, no erythema and lips, buccal mucosa, and tongue normal  NECK: supple, thyroid normal size, non-tender, without nodularity LYMPH:  no palpable lymphadenopathy in the cervical, axillary or inguinal LUNGS: clear to auscultation and percussion with normal breathing effort HEART: regular rate & rhythm and no murmurs and no lower extremity edema ABDOMEN:abdomen soft, non-tender and normal bowel sounds MUSCULOSKELETAL:no cyanosis of digits and no clubbing  NEURO: alert & oriented x 3 with fluent speech, no focal motor/sensory deficits EXTREMITIES: Right hip bursitis BREAST: No palpable lumps or nodules in the right chest wall or axilla (exam performed in the presence of a chaperone)  LABORATORY DATA:  I have reviewed the data as listed CMP Latest Ref Rng & Units 03/07/2017 01/25/2016 12/14/2015  Glucose 65 - 99 mg/dL 82 79 93  BUN 6 - 20 mg/dL 26(H) 30.7(H) 25.7  Creatinine 0.44 - 1.00 mg/dL 0.94 1.0 0.9  Sodium 135 - 145 mmol/L 140 140 143  Potassium 3.5 - 5.1 mmol/L 3.4(L) 3.8 4.1  Chloride 101 - 111 mmol/L 107 - -  CO2 22 - 32 mmol/L _0 Calcium 8.9 - 10.3 mg/dL 9.2 9.8 9.3    Total Protein 6.4 - 8.3 g/dL - 6.5 6.3(L)  Total Bilirubin 0.20 - 1.20 mg/dL - 0.69 0.66  Alkaline Phos 40 - 150 U/L - 113 142  AST 5 - 34 U/L - 33 55(H)  ALT 0 - 55 U/L - 33 76(H)    Lab Results  Component Value Date   WBC 3.1 (L) 03/07/2017   HGB 13.3 03/07/2017   HCT 38.9 03/07/2017   MCV 99.7 03/07/2017   PLT 169 03/07/2017   NEUTROABS 2.8 01/25/2016    ASSESSMENT & PLAN:  Breast cancer of lower-inner quadrant of right female breast ([place]) 1. Right lumpectomy 02/03/2015: Invasive ductal carcinoma grade 2, 2.1 cm, with associated DCIS, margins are negative, 0/1 lymph node, ER 0%, PR 0%, HER-2 positiveratio 2.31, T2 N0 stage II a. (Right breast biopsy 3:30 position 12/09/2014: Invasive ductal carcinoma grade 3, ER 0%, PR 0%, HER-2 negativeratio 1.5, Ki-67 90%, 1 cm irregular mass T1C N0 stage IA clinical stage) Genetic counseling revealed NBN mutation  2. Adjuvant chemotherapy with [place] 6 cycles started 03/09/15 completed  06/22/15 tookHerceptin maintenance for 1 year until 02/15/2016 3. Followed by radiation therapy 08/11/2015 to09/05/2015 4. Cutaneous recurrence: 03/07/2017: Right mastectomy: IDC grade 3, 2 foci spanning 1.2 cm and 1.1 cm, lymphovascular invasion is present including dermal lymphatics, margins negative, 0/2 lymph nodes negative, ER 0%, PR 0%, HER-2 negative, Ki-67 not done,pT4b (skin involvement) N0 stage IIIc PET/CT 02/16/2017: No metastatic disease -----------------------------------------------------------------------------------------------  Patient decided not to receive any further systemic chemotherapy. There is no role of antiestrogen therapy because she is triple negative.  Surveillance: 1. Breast Exam: 07/25/17: Right mastectomy scar is intact there is still some blue present, left breast no palpable lumps or nodules. 2. Mammograms to be done November/Dec 2019  We will continue our surveillance plan and follow-up in 1 year.    No orders of the  defined types were placed in this encounter.  The patient has a good understanding of the overall plan. she agrees with it. she will call with any problems that may develop before the next visit here.   Harriette Ohara, MD 07/25/17

## 2017-07-25 NOTE — Telephone Encounter (Signed)
Per 7/9 no los 

## 2017-07-26 DIAGNOSIS — C50911 Malignant neoplasm of unspecified site of right female breast: Secondary | ICD-10-CM | POA: Diagnosis not present

## 2017-07-31 DIAGNOSIS — M67432 Ganglion, left wrist: Secondary | ICD-10-CM | POA: Diagnosis not present

## 2017-08-01 DIAGNOSIS — M25552 Pain in left hip: Secondary | ICD-10-CM | POA: Diagnosis not present

## 2017-08-01 DIAGNOSIS — M25551 Pain in right hip: Secondary | ICD-10-CM | POA: Diagnosis not present

## 2017-08-09 DIAGNOSIS — M25551 Pain in right hip: Secondary | ICD-10-CM | POA: Diagnosis not present

## 2017-08-09 DIAGNOSIS — M25552 Pain in left hip: Secondary | ICD-10-CM | POA: Diagnosis not present

## 2017-08-16 DIAGNOSIS — M25552 Pain in left hip: Secondary | ICD-10-CM | POA: Diagnosis not present

## 2017-08-16 DIAGNOSIS — M25551 Pain in right hip: Secondary | ICD-10-CM | POA: Diagnosis not present

## 2017-08-17 DIAGNOSIS — M7061 Trochanteric bursitis, right hip: Secondary | ICD-10-CM | POA: Diagnosis not present

## 2017-08-17 DIAGNOSIS — M1711 Unilateral primary osteoarthritis, right knee: Secondary | ICD-10-CM | POA: Diagnosis not present

## 2017-08-17 DIAGNOSIS — M25561 Pain in right knee: Secondary | ICD-10-CM | POA: Diagnosis not present

## 2017-08-19 ENCOUNTER — Other Ambulatory Visit: Payer: Self-pay | Admitting: Nurse Practitioner

## 2017-08-22 DIAGNOSIS — M25552 Pain in left hip: Secondary | ICD-10-CM | POA: Diagnosis not present

## 2017-08-22 DIAGNOSIS — M25551 Pain in right hip: Secondary | ICD-10-CM | POA: Diagnosis not present

## 2017-08-30 DIAGNOSIS — M25552 Pain in left hip: Secondary | ICD-10-CM | POA: Diagnosis not present

## 2017-08-30 DIAGNOSIS — M25551 Pain in right hip: Secondary | ICD-10-CM | POA: Diagnosis not present

## 2017-09-01 NOTE — Progress Notes (Signed)
GYNECOLOGY  VISIT   HPI: 77 y.o.   Widowed  Caucasian  female   G2P2002 with Patient's last menstrual period was 01/17/1982 (approximate).   here for pessary check.  Her prolapse is well controlled with a #5 ring pessary with support. She uses trimosan vaginally every week to help prevent vaginal irritation. No vaginal bleeding, no abnormal vaginal d/c, no irritation.    GYNECOLOGIC HISTORY: Patient's last menstrual period was 01/17/1982 (approximate). Contraception:postmenopausal Menopausal hormone therapy: trimosan gel        OB History    Gravida  2   Para  2   Term  2   Preterm  0   AB  0   Living  2     SAB  0   TAB  0   Ectopic  0   Multiple  0   Live Births  2              Patient Active Problem List   Diagnosis Date Noted  . 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 Trochanteric bursitis of right hip 07/06/2017  . Recurrent breast cancer, right ([place]) 03/07/2017  . Monoallelic mutation of NBN gene   . Genetic testing 01/13/2015  . Family history of breast cancer   . Family history of colon cancer   . Breast cancer of lower-inner quadrant of right female breast ([place]) 12/30/2014    Past Medical History:  Diagnosis Date  . Breast cancer ([place]) 11/2014  . Family history of breast cancer   . Family history of colon cancer   . History of kidney stones   . History of radiation therapy 08/11/15- 09/23/2015   Right Breast  . Hyperlipidemia   . Hypertension   . Monoallelic mutation of NBN gene   . Osteoporosis   . Skin cancer of nose     Past Surgical History:  Procedure Laterality Date  . APPENDECTOMY    . BREAST LUMPECTOMY WITH RADIOACTIVE SEED AND SENTINEL LYMPH NODE BIOPSY Right 02/03/2015   Procedure: RIGHT BREAST LUMPECTOMY WITH RADIOACTIVE SEED AND RIGHT SENTINEL LYMPH NODE BIOPSY;  Surgeon: [name], MD;  Location: [facility];  Service: General;  Laterality: Right;  . BREAST SURGERY   11/2012   biopsy, benign breast tissue  . COLONOSCOPY  06/2010   rec  . LITHOTRIPSY     kidney stone on left side  . MASTECTOMY W/ SENTINEL NODE BIOPSY Right 03/07/2017   Procedure: RIGHT TOTAL MASTECTOMY WITH RIGHT AXILLARY SENTINEL LYMPH NODE BIOPSY;  Surgeon: [name], MD;  Location: [facility];  Service: General;  Laterality: Right;  . OVARIAN CYST REMOVAL  age 6  . POLYPECTOMY  04/2005   colon  . PORTACATH PLACEMENT Left 02/03/2015   Procedure: INSERTION PORT-A-CATH WITH ULTRA SOUND GUIDANCE;  Surgeon: [name], MD;  Location: [facility];  Service: General;  Laterality: Left;    Current Outpatient Medications  Medication Sig Dispense Refill  . CALCIUM PO Take 1,000 mg by mouth daily.    . CRESTOR 20 MG tablet Take 1 tablet by mouth daily.  0  . hydrochlorothiazide (MICROZIDE) 12.5 MG capsule Take 12.5 mg by mouth daily.     Marland Kitchen losartan (COZAAR) 100 MG tablet Take 100 mg by mouth daily.  0  . OXYQUINOLONE SULFATE VAGINAL (TRIMO-SAN) 0.025 % GEL Place 1 application vaginally once a week.    . Vitamin D, Ergocalciferol, 2000 units CAPS Take 1 tablet by mouth  daily.      No current facility-administered medications for this visit.      ALLERGIES: Patient has no known allergies.  Family History  Problem Relation Age of Onset  . Colon cancer Sister 53       died at 2  . Breast cancer Mother 76  . Hypertension Mother   . Osteoporosis Mother   . Heart disease Father        CABG  . Dementia Father   . Throat cancer Brother 67  . Breast cancer Sister 52  . Lymphoma Sister 60  . Melanoma Sister   . Skin cancer Brother   . Skin cancer Daughter     Social History   Socioeconomic History  . Marital status: Widowed    Spouse name: Not on file  . Number of children: 2  . Years of education: Not on file  . Highest education level: Not on file  Occupational History  . Not on file  Social Needs  . Financial resource strain: Not on file  . Food insecurity:     Worry: Not on file    Inability: Not on file  . Transportation needs:    Medical: Not on file    Non-medical: Not on file  Tobacco Use  . Smoking status: Never Smoker  . Smokeless tobacco: Never Used  Substance and Sexual Activity  . Alcohol use: Yes    Alcohol/week: 7.0 standard drinks    Types: 7 Glasses of wine per week    Comment: 6-7 glasses of wine a week  . Drug use: No  . Sexual activity: Not Currently    Partners: Male    Birth control/protection: Post-menopausal  Lifestyle  . Physical activity:    Days per week: Not on file    Minutes per session: Not on file  . Stress: Not on file  Relationships  . Social connections:    Talks on phone: Not on file    Gets together: Not on file    Attends religious service: Not on file    Active member of club or organization: Not on file    Attends meetings of clubs or organizations: Not on file    Relationship status: Not on file  . Intimate partner violence:    Fear of current or ex partner: Not on file    Emotionally abused: Not on file    Physically abused: Not on file    Forced sexual activity: Not on file  Other Topics Concern  . Not on file  Social History Narrative  . Not on file    Review of Systems  Constitutional: Negative.        Weight gain  HENT: Negative.   Eyes: Negative.   Respiratory: Negative.   Cardiovascular: Negative.   Gastrointestinal: Negative.        Bloating  Genitourinary: Negative.   Musculoskeletal: Positive for myalgias.  Skin: Negative.   Neurological: Negative.   Endo/Heme/Allergies: Negative.   Psychiatric/Behavioral: Negative.   All other systems reviewed and are negative.   PHYSICAL EXAMINATION:    BP 122/72   Ht 5\' 8"  (1.727 m)   Wt 145 lb (65.8 kg)   LMP 01/17/1982 (Approximate)   BMI 22.05 kg/m     General appearance: alert, cooperative and appears stated age  Pelvic: External genitalia:  no lesions              Urethra:  normal appearing urethra with no  masses, tenderness or lesions  Bartholins and Skenes: normal                 Vagina: The pessary was removed and cleaned. Small amount of clumpy white vaginal d/c. Minimal irritation posterior to the cervix. No erosion, no granulation tissue, not friable.               Cervix: no lesions               Chaperone was present for exam.  ASSESSMENT Pessary check, feeling well, no c/o. Minimal vaginal irritation.    PLAN F/U in 3 months Continue to use the trimosan weekly Call with any questions or concerns   An After Visit Summary was printed and given to the patient.

## 2017-09-07 DIAGNOSIS — M25551 Pain in right hip: Secondary | ICD-10-CM | POA: Diagnosis not present

## 2017-09-07 DIAGNOSIS — M25552 Pain in left hip: Secondary | ICD-10-CM | POA: Diagnosis not present

## 2017-09-11 ENCOUNTER — Encounter: Payer: Self-pay | Admitting: Obstetrics and Gynecology

## 2017-09-11 ENCOUNTER — Ambulatory Visit (INDEPENDENT_AMBULATORY_CARE_PROVIDER_SITE_OTHER): Payer: Medicare Other | Admitting: Obstetrics and Gynecology

## 2017-09-11 ENCOUNTER — Other Ambulatory Visit: Payer: Self-pay

## 2017-09-11 VITALS — BP 122/72 | Ht 68.0 in | Wt 145.0 lb

## 2017-09-11 DIAGNOSIS — Z4689 Encounter for fitting and adjustment of other specified devices: Secondary | ICD-10-CM

## 2017-09-25 ENCOUNTER — Encounter: Payer: Self-pay | Admitting: Podiatry

## 2017-09-25 ENCOUNTER — Ambulatory Visit: Payer: Medicare Other | Admitting: Podiatry

## 2017-09-25 DIAGNOSIS — L84 Corns and callosities: Secondary | ICD-10-CM

## 2017-09-25 DIAGNOSIS — G629 Polyneuropathy, unspecified: Secondary | ICD-10-CM | POA: Diagnosis not present

## 2017-09-26 NOTE — Progress Notes (Signed)
Subjective:   Patient ID: Shawna [name], female   DOB: 77 y.o.   MRN: [id number]   HPI Patient presents with significant keratotic lesion sub-second metatarsal right sub-first and fifth metatarsal left that are thick and painful when palpated   ROS      Objective:  Physical Exam  Chronic lesion formation with metatarsal deformity     Assessment:  Chronic keratotic lesion with pain     Plan:  Sterile sharp debridement accomplished no iatrogenic bleeding and reappoint for routine care or earlier if needed

## 2017-09-27 ENCOUNTER — Other Ambulatory Visit: Payer: Self-pay | Admitting: Surgery

## 2017-09-27 DIAGNOSIS — C792 Secondary malignant neoplasm of skin: Secondary | ICD-10-CM | POA: Diagnosis not present

## 2017-09-27 DIAGNOSIS — C50911 Malignant neoplasm of unspecified site of right female breast: Secondary | ICD-10-CM | POA: Diagnosis not present

## 2017-09-27 DIAGNOSIS — L989 Disorder of the skin and subcutaneous tissue, unspecified: Secondary | ICD-10-CM | POA: Diagnosis not present

## 2017-09-27 DIAGNOSIS — C50919 Malignant neoplasm of unspecified site of unspecified female breast: Secondary | ICD-10-CM | POA: Diagnosis not present

## 2017-10-09 ENCOUNTER — Other Ambulatory Visit: Payer: Self-pay | Admitting: Surgery

## 2017-10-09 DIAGNOSIS — C50911 Malignant neoplasm of unspecified site of right female breast: Secondary | ICD-10-CM | POA: Diagnosis not present

## 2017-10-09 DIAGNOSIS — C50919 Malignant neoplasm of unspecified site of unspecified female breast: Secondary | ICD-10-CM | POA: Diagnosis not present

## 2017-10-09 HISTORY — PX: OTHER SURGICAL HISTORY: SHX169

## 2017-10-11 ENCOUNTER — Telehealth: Payer: Self-pay

## 2017-10-11 NOTE — Telephone Encounter (Signed)
Called to return pt VM regarding making an appt with Dr.Gudena. Pt had recent chest wall surgery of skin lesion and was positive for cancer. She would like to sit down with Dr.Gudena and discuss next step. Confirmed time/date for next week's appt. Pt appreciative of call.

## 2017-10-15 NOTE — Assessment & Plan Note (Addendum)
1. Right lumpectomy 02/03/2015: Invasive ductal carcinoma grade 2, 2.1 cm, with associated DCIS, margins are negative, 0/1 lymph node, ER 0%, PR 0%, HER-2 positiveratio 2.31, T2 N0 stage II a. (Right breast biopsy 3:30 position 12/09/2014: Invasive ductal carcinoma grade 3, ER 0%, PR 0%, HER-2 negativeratio 1.5, Ki-67 90%, 1 cm irregular mass T1C N0 stage IA clinical stage) Genetic counseling revealed NBN mutation  2. Adjuvant chemotherapy with [place] 6 cycles started 03/09/15 completed 06/22/15 tookHerceptin maintenance for 1 year until 02/15/2016 3. Followed by radiation therapy 08/11/2015 to09/05/2015 4. Cutaneous recurrence: 03/07/2017: Right mastectomy: IDC grade 3, 2 foci spanning 1.2 cm and 1.1 cm, lymphovascular invasion is present including dermal lymphatics, margins negative, 0/2 lymph nodes negative, ER 0%, PR 0%, HER-2 negative, Ki-67 not done,pT4b (skin involvement) N0 stage IIIc PET/CT 02/16/2017: No metastatic disease -----------------------------------------------------------------------------------------------  Patient decided not to receive any further systemic chemotherapy. There is no role of antiestrogen therapy because she is triple negative.  Chest wall Recurrence:  10/10/17: Chest wall excision: Breast cancer  Plan: Continued surveillance

## 2017-10-16 ENCOUNTER — Inpatient Hospital Stay: Payer: Medicare Other | Attending: Hematology and Oncology | Admitting: Hematology and Oncology

## 2017-10-16 VITALS — BP 140/73 | HR 79 | Temp 98.5°F | Resp 17 | Ht 68.0 in | Wt 145.1 lb

## 2017-10-16 DIAGNOSIS — C50311 Malignant neoplasm of lower-inner quadrant of right female breast: Secondary | ICD-10-CM

## 2017-10-16 DIAGNOSIS — Z171 Estrogen receptor negative status [ER-]: Secondary | ICD-10-CM

## 2017-10-16 DIAGNOSIS — C792 Secondary malignant neoplasm of skin: Secondary | ICD-10-CM

## 2017-10-16 DIAGNOSIS — Z853 Personal history of malignant neoplasm of breast: Secondary | ICD-10-CM | POA: Diagnosis not present

## 2017-10-16 NOTE — Progress Notes (Signed)
Patient Care Team: [name], MD as PCP - General (Internal Medicine) [name], MD as Consulting Physician (General Surgery) [name], MD as Consulting Physician (Hematology and Oncology) Delice Bison Charlestine Massed, NP as Nurse Practitioner (Hematology and Oncology)  DIAGNOSIS:  Encounter Diagnosis  Name Primary?  . Malignant neoplasm of lower-inner quadrant of right breast of female, estrogen receptor negative ([place])     SUMMARY OF ONCOLOGIC HISTORY:   Breast cancer of lower-inner quadrant of right female breast ([place])   12/02/2014 Mammogram    Right breast irregular mass 1 cm size with calcifications    12/09/2014 Initial Diagnosis    Right breast biopsy 3:30 position: Invasive ductal carcinoma grade 3, ER 0%, PR 0%, HER-2 negative ratio 1.5, Ki-67 90%, T1C N0 stage IA clinical stage    12/31/2014 Genetic Testing    Testing revealed a mutation in the NBN gene called [id number]. Genes tested include:  ATM, BARD1, BRCA1, BRCA2, BRIP1, CDH1, CHEK2, EPCAM, FANCC, MLH1, MSH2, MSH6, NBN, PALB2, PMS2, PTEN, RAD51C, RAD51D, TP53, and XRCC2.    02/03/2015 Surgery    Right lumpectomy: Invasive ductal carcinoma grade 2, 2.1 cm, with associated DCIS, margins are negative, 0/1 lymph node, ER 0%, PR 0%, HER-2 positive ratio 2.31, T2 N0 stage II a    03/09/2015 - 06/22/2015 Chemotherapy    Adjuvant chemotherapy with Shawna [name] 6 followed by Herceptin maintenance for 1 year    08/11/2015 - 09/23/2015 Radiation Therapy    Adjuvant radiation therapy Shawna [name]):  1) Right Breast / 50 Gy in 25 fractions ; 2) Right Breast Boost / 10 Gy in 5 fractions    01/30/2017 Relapse/Recurrence    Skin biopsy right breast: Metastatic breast cancer GCDFP strongly positive    02/15/2017 PET scan    Hypermetabolic focus of skin thickening along the medial right breast/chest wall consistent with recurrent disease, no other hypermetabolic metastases identified.    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  She was presented last week in the breast tumor board.  She is here to discuss her treatment options.  REVIEW OF SYSTEMS:   Constitutional: Denies fevers, chills or abnormal weight loss Eyes: Denies blurriness of vision Ears, nose, mouth, throat, and face: Denies mucositis or sore throat Respiratory: Denies cough, dyspnea or wheezes Cardiovascular: Denies palpitation, chest discomfort Gastrointestinal:  Denies nausea, heartburn or change in bowel habits Skin: Denies abnormal skin rashes Lymphatics: Denies new lymphadenopathy or easy bruising Neurological:Denies numbness, tingling or new weaknesses Behavioral/Psych: Mood is stable, no new changes  Extremities: No lower extremity edema Breast: Right mastectomy cutaneous recurrence status post excision All other systems were reviewed with the patient and are negative.  I have reviewed the past medical history, past surgical history, social history and family history with the patient and they are unchanged from previous note.  ALLERGIES:  has No Known Allergies.  MEDICATIONS:  Current Outpatient Medications  Medication Sig Dispense Refill  . CALCIUM PO Take  1,000 mg by mouth daily.    . CRESTOR 20 MG tablet Take 1 tablet by mouth daily.  0  . hydrochlorothiazide (MICROZIDE) 12.5 MG capsule Take 12.5 mg by mouth daily.     Marland Kitchen losartan (COZAAR) 100 MG tablet Take 100  mg by mouth daily.  0  . OXYQUINOLONE SULFATE VAGINAL (TRIMO-SAN) 0.025 % GEL Place 1 application vaginally once a week.    . Vitamin D, Ergocalciferol, 2000 units CAPS Take 1 tablet by mouth daily.      No current facility-administered medications for this visit.     PHYSICAL EXAMINATION: ECOG PERFORMANCE STATUS: 1 - Symptomatic but completely ambulatory  Vitals:   10/16/17 1337  BP: 140/73  Pulse: 79  Resp: 17  Temp: 98.5 F (36.9 C)  SpO2: 100%   Filed Weights   10/16/17 1337  Weight: 145 lb 1.6 oz (65.8 kg)    GENERAL:alert, no distress and comfortable SKIN: skin color, texture, turgor are normal, no rashes or significant lesions EYES: normal, Conjunctiva are pink and non-injected, sclera clear OROPHARYNX:no exudate, no erythema and lips, buccal mucosa, and tongue normal  NECK: supple, thyroid normal size, non-tender, without nodularity LYMPH:  no palpable lymphadenopathy in the cervical, axillary or inguinal LUNGS: clear to auscultation and percussion with normal breathing effort HEART: regular rate & rhythm and no murmurs and no lower extremity edema ABDOMEN:abdomen soft, non-tender and normal bowel sounds MUSCULOSKELETAL:no cyanosis of digits and no clubbing  NEURO: alert & oriented x 3 with fluent speech, no focal motor/sensory deficits EXTREMITIES: No lower extremity edema   LABORATORY DATA:  I have reviewed the data as listed CMP Latest Ref Rng & Units 03/07/2017 01/25/2016 12/14/2015  Glucose 65 - 99 mg/dL 82 79 93  BUN 6 - 20 mg/dL 26(H) 30.7(H) 25.7  Creatinine 0.44 - 1.00 mg/dL 0.94 1.0 0.9  Sodium 135 - 145 mmol/L 140 140 143  Potassium 3.5 - 5.1 mmol/L 3.4(L) 3.8 4.1  Chloride 101 - 111 mmol/L 107 - -  CO2 22 - 32 mmol/L '22 24 25  ' Calcium 8.9 - 10.3 mg/dL 9.2 9.8 9.3  Total Protein 6.4 - 8.3 g/dL - 6.5 6.3(L)  Total Bilirubin 0.20 - 1.20 mg/dL - 0.69 0.66  Alkaline Phos 40 - 150 U/L - 113 142  AST 5 - 34 U/L - 33 55(H)  ALT 0 - 55 U/L - 33 76(H)     Lab Results  Component Value Date   WBC 3.1 (L) 03/07/2017   HGB 13.3 03/07/2017   HCT 38.9 03/07/2017   MCV 99.7 03/07/2017   PLT 169 03/07/2017   NEUTROABS 2.8 01/25/2016    ASSESSMENT & PLAN:  Breast cancer of lower-inner quadrant of right female breast ([place]) 1. Right lumpectomy 02/03/2015: Invasive ductal carcinoma grade 2, 2.1 cm, with associated DCIS, margins are negative, 0/1 lymph node, ER 0%, PR 0%, HER-2 positiveratio 2.31, T2 N0 stage II a. (Right breast biopsy 3:30 position 12/09/2014: Invasive ductal carcinoma grade 3, ER 0%, PR 0%, HER-2 negativeratio 1.5, Ki-67 90%, 1 cm irregular mass T1C N0 stage IA clinical stage) Genetic counseling revealed NBN mutation  2. Adjuvant chemotherapy with [place] 6 cycles started 03/09/15 completed 06/22/15 tookHerceptin maintenance for 1 year until 02/15/2016 3. Followed by radiation therapy 08/11/2015 to09/05/2015 4. Cutaneous recurrence: 03/07/2017: Right mastectomy: IDC grade 3, 2 foci spanning 1.2 cm and 1.1 cm, lymphovascular invasion is present including dermal lymphatics, margins negative, 0/2 lymph nodes negative, ER 0%, PR 0%, HER-2 negative, Ki-67 not done,pT4b (skin  involvement) N0 stage IIIc PET/CT 02/16/2017: No metastatic disease -----------------------------------------------------------------------------------------------  Patient decided not to receive any further systemic chemotherapy. There is no role of antiestrogen therapy because she is triple negative.  Chest wall Recurrence:  10/10/17: Chest wall excision: Breast cancer, ER 0%, PR 0%, HER-2 negative  Plan: Tumor board recommended radiation oncology consultation I would like to obtain scans to ensure no other evidence of distant disease. Continued surveillance Patient will be leaving to [place] on October 11 for a week. She would like to get some of these appointments done before then and most likely if she is a candidate for radiation she would like to do it  after she returns back from [place].   No orders of the defined types were placed in this encounter.  The patient has a good understanding of the overall plan. she agrees with it. she will call with any problems that may develop before the next visit here.   Harriette Ohara, MD 10/16/17

## 2017-10-18 ENCOUNTER — Encounter: Payer: Self-pay | Admitting: Radiation Oncology

## 2017-10-24 ENCOUNTER — Ambulatory Visit (HOSPITAL_COMMUNITY)
Admission: RE | Admit: 2017-10-24 | Discharge: 2017-10-24 | Disposition: A | Discharge status: Home / Self Care | Payer: Medicare Other | Source: Ambulatory Visit | Attending: Hematology and Oncology | Admitting: Hematology and Oncology

## 2017-10-24 DIAGNOSIS — Z171 Estrogen receptor negative status [ER-]: Secondary | ICD-10-CM | POA: Insufficient documentation

## 2017-10-24 DIAGNOSIS — C50911 Malignant neoplasm of unspecified site of right female breast: Secondary | ICD-10-CM | POA: Diagnosis not present

## 2017-10-24 DIAGNOSIS — I700 Atherosclerosis of aorta: Secondary | ICD-10-CM | POA: Diagnosis not present

## 2017-10-24 DIAGNOSIS — Z9011 Acquired absence of right breast and nipple: Secondary | ICD-10-CM | POA: Diagnosis not present

## 2017-10-24 DIAGNOSIS — C792 Secondary malignant neoplasm of skin: Secondary | ICD-10-CM | POA: Insufficient documentation

## 2017-10-24 DIAGNOSIS — I2510 Atherosclerotic heart disease of native coronary artery without angina pectoris: Secondary | ICD-10-CM | POA: Diagnosis not present

## 2017-10-24 DIAGNOSIS — C50311 Malignant neoplasm of lower-inner quadrant of right female breast: Secondary | ICD-10-CM | POA: Insufficient documentation

## 2017-10-24 DIAGNOSIS — K8020 Calculus of gallbladder without cholecystitis without obstruction: Secondary | ICD-10-CM | POA: Insufficient documentation

## 2017-10-24 DIAGNOSIS — K5730 Diverticulosis of large intestine without perforation or abscess without bleeding: Secondary | ICD-10-CM | POA: Diagnosis not present

## 2017-10-24 LAB — GLUCOSE, CAPILLARY: Glucose-Capillary: 92 mg/dL (ref 70–99)

## 2017-10-24 MED ORDER — FLUDEOXYGLUCOSE F - 18 (FDG) INJECTION
7.4200 | Freq: Once | INTRAVENOUS | Status: AC | PRN
  Administered 2017-10-24: 7.42 via INTRAVENOUS

## 2017-10-25 ENCOUNTER — Telehealth: Payer: Self-pay | Admitting: Hematology and Oncology

## 2017-10-25 NOTE — Telephone Encounter (Signed)
I informed the patient that the PET CT scan does not show any evidence of distant metastatic disease.  There is some activity at the site of prior surgery and a lymph node in the left axilla measuring 0.8 cm which does not look hypermetabolic.  This will need to be monitored but otherwise she has no evidence of distant mets.

## 2017-10-26 DIAGNOSIS — C50911 Malignant neoplasm of unspecified site of right female breast: Secondary | ICD-10-CM | POA: Diagnosis not present

## 2017-10-26 DIAGNOSIS — L989 Disorder of the skin and subcutaneous tissue, unspecified: Secondary | ICD-10-CM | POA: Diagnosis not present

## 2017-10-27 NOTE — Progress Notes (Signed)
Location of Breast Cancer:  Right Chest  Histology per Pathology Report:  10/10/17 Chest wall excision: Breast cancer, ER 0%, PR 0%, HER-2 negative  Receptor Status: ER(NEG), PR, (NEG), HER 2 (NEG)  Did patient present with symptoms? She noticed a growth/mole type of "thing" on her Right chest and brought it to the attention of Dr. Lucia Gaskins at a follow up appointment.   Past/Anticipated interventions by surgeon, if any: 03/07/17 Right Mastectomy Dr. Lucia Gaskins.  10/10/17 Chest wall excision By Dr. Lucia Gaskins  Past/Anticipated interventions by medical oncology, if any:  10/16/17- Dr. Lindi Adie Cutaneous recurrence: 03/07/2017: Right mastectomy: IDC grade 3, 2 foci spanning 1.2 cm and 1.1 cm, lymphovascular invasion is present including dermal lymphatics, margins negative, 0/2 lymph nodes negative, ER 0%, PR 0%, HER-2 negative, Ki-67 not done,pT4b (skin involvement) N0 stage IIIc PET/CT 02/16/2017: No metastatic disease -----------------------------------------------------------------------------------------------  Patient decided not to receive any further systemic chemotherapy. There is no role of antiestrogen therapy because she is triple negative.  Chest wall Recurrence:  10/10/17: Chest wall excision: Breast cancer, ER 0%, PR 0%, HER-2 negative  Plan: Tumor board recommended radiation oncology consultation I would like to obtain scans to ensure no other evidence of distant disease. Continued surveillance Patient will be leaving to [place] on October 11 for a week. She would like to get some of these appointments done before then and most likely if she is a candidate for radiation she would like to do it after she returns back from [place].   Lymphedema issues, if any:  She denies.   Pain issues, if any:  She denies pain.   SAFETY ISSUES:  Prior radiation? Yes,  08/11/2015-09/23/2015: 1) Right Breast / 50 Gy in 25 fractions 2) Right Breast Boost / 10 Gy in 5 fractions  Pacemaker/ICD?  No  Possible current pregnancy? No  Is the patient on methotrexate? No  Current Complaints / other details:    BP (!) 130/97 (BP Location: Left Arm, Patient Position: Sitting)   Pulse 73   Temp 98.2 F (36.8 C) (Oral)   Resp 18   Ht 5' 8.5" (1.74 m)   Wt 146 lb 4 oz (66.3 kg)   LMP 01/17/1982 (Approximate)   SpO2 94%   BMI 21.91 kg/m    Wt Readings from Last 3 Encounters:  11/07/17 146 lb 4 oz (66.3 kg)  10/16/17 145 lb 1.6 oz (65.8 kg)  09/11/17 145 lb (65.8 kg)      De Libman, Stephani Police, RN 10/27/2017,9:54 AM

## 2017-11-06 DIAGNOSIS — I10 Essential (primary) hypertension: Secondary | ICD-10-CM | POA: Diagnosis not present

## 2017-11-06 DIAGNOSIS — E7849 Other hyperlipidemia: Secondary | ICD-10-CM | POA: Diagnosis not present

## 2017-11-06 DIAGNOSIS — R82998 Other abnormal findings in urine: Secondary | ICD-10-CM | POA: Diagnosis not present

## 2017-11-06 DIAGNOSIS — E559 Vitamin D deficiency, unspecified: Secondary | ICD-10-CM | POA: Diagnosis not present

## 2017-11-07 ENCOUNTER — Ambulatory Visit
Admission: RE | Admit: 2017-11-07 | Discharge: 2017-11-07 | Disposition: A | Discharge status: Home / Self Care | Payer: Medicare Other | Source: Ambulatory Visit | Attending: Radiation Oncology | Admitting: Radiation Oncology

## 2017-11-07 ENCOUNTER — Encounter: Payer: Self-pay | Admitting: Radiation Oncology

## 2017-11-07 ENCOUNTER — Other Ambulatory Visit: Payer: Self-pay

## 2017-11-07 VITALS — BP 130/97 | HR 73 | Temp 98.2°F | Resp 18 | Ht 68.5 in | Wt 146.2 lb

## 2017-11-07 DIAGNOSIS — C7989 Secondary malignant neoplasm of other specified sites: Secondary | ICD-10-CM | POA: Diagnosis not present

## 2017-11-07 DIAGNOSIS — C50911 Malignant neoplasm of unspecified site of right female breast: Secondary | ICD-10-CM

## 2017-11-07 DIAGNOSIS — Z171 Estrogen receptor negative status [ER-]: Secondary | ICD-10-CM | POA: Insufficient documentation

## 2017-11-07 DIAGNOSIS — C50311 Malignant neoplasm of lower-inner quadrant of right female breast: Secondary | ICD-10-CM | POA: Diagnosis not present

## 2017-11-07 DIAGNOSIS — Z510 Encounter for antineoplastic radiation therapy: Secondary | ICD-10-CM | POA: Insufficient documentation

## 2017-11-07 DIAGNOSIS — Z9011 Acquired absence of right breast and nipple: Secondary | ICD-10-CM | POA: Diagnosis not present

## 2017-11-07 NOTE — Progress Notes (Signed)
Radiation Oncology         [phone] ________________________________  Outpatient Re-Consultation  Name: Shawna [name] MRN: [id number]  Date: 11/07/2017  DOB: Oct 15, 1940  NW:GNFAO, [name], MD  [name], MD   REFERRING PHYSICIAN: [name], MD  DIAGNOSIS:    ICD-10-CM   1. Recurrent breast cancer, right ([place]) C50.911   2. Malignant neoplasm of lower-inner quadrant of right breast of female, estrogen receptor negative ([place]) C50.311    Z17.1   Cancer Staging Breast cancer of lower-inner quadrant of right female breast [facility]) Staging form: Breast, AJCC 7th Edition - Pathologic: Stage IIA (T2, N0, cM0) - Unsigned  Recurrent breast cancer, right ([place]) Staging form: Breast, AJCC 8th Edition - Clinical: Stage IIIC (cT4b, cN0, cM0, G3, ER: Negative, PR: Negative, HER2: Negative) - Unsigned   Stage IIIc, pT4b (due to gross involvement of skin), clinically recurrent, pNXpMX, Right Breast Invasive Ductal Carcinoma, ER(-) / PR(-) / Her2(-), Grade 3  CHIEF COMPLAINT: Here to discuss management of recurrent right breast cancer  HISTORY OF PRESENT ILLNESS::Shawna [name] is a 77 y.o. female with a history of right breast cancer diagnosed in November 2016 status post lumpectomy, adjuvant chemotherapy, and adjuvant radiotherapy. She developed cutaneous recurrence in January 2019 when she noticed a small, discolored bump on the skin of her right breast. She underwent skin excision of the right breast by dermatology which revealed metastatic breast cancer, GCDFP strongly positive. PET scan on 02/15/2017 showed hypermetabolic focus of skin thickening about the medial right breast/chest wall, consistent with recurrent/metastatic disease; no other hypermetabolic metastases identified. She subsequently underwent right mastectomy with right axillary sentinel lymph node biopsy on 03/07/2017. Pathology revealed invasive ductal carcinoma, grade 3, with two foci spanning 1.2 cm and 1.1 cm.  Lymphovascular invasion was identified, including dermal lymphatics. The surgical resection margins to >0.2 cm were negative for carcinoma. 2 lymph nodes biopsied were negative. ER negative, PR negative, Her2 negative. At that time, she decided not to receive any further systemic chemotherapy and instead elected observation. Per Dr. Lindi Adie, there is no role of antiestrogen therapy because she is triple negative.  Despite mastectomy, she developed a second recurrence and underwent chest wall excision on 10/09/2017. Biopsy of the chest wall lesion revealed residual breast carcinoma, margins free. I have seen the photo taken by Dr Lucia Gaskins, pre excision.   Follow-up PET scan on 10/24/2017 showed interval mastectomy with removal of the nodular region along the right medial breast. There is some linear bandlike accentuated activity along the medial margin of the mastectomy site with maximum SUV up to 3.0, most likely benign. A left axillary lymph node measuring only 0.8 cm has a more solid appearance on the prior exam and currently has a maximum SUV of 2.0, previously 0.7. The current activity is similar to background blood pool. The patient reviewed these results with Dr. Lindi Adie and has been referred today for discussion of potential radiation treatment options.   On review of systems, the patient reports resolved bursitis in right hip. She reports a red bump on her abdomen.  PREVIOUS RADIATION THERAPY: Yes  Radiation treatment dates:   08/11/2015-09/23/2015 Site/dose:   1) Right Breast / 50 Gy in 25 fractions; 2) Right Breast Boost / 10 Gy in 5 fractions  PAST MEDICAL HISTORY:  has a past medical history of Breast cancer ([place]) (11/2014), Family history of breast cancer, Family history of colon cancer, History of kidney stones, History of radiation therapy (08/11/15- 09/23/2015), Hyperlipidemia, Hypertension, Monoallelic mutation of NBN  gene, Osteoporosis, and Skin cancer of nose.    PAST SURGICAL HISTORY: Past  Surgical History:  Procedure Laterality Date  . APPENDECTOMY    . BREAST LUMPECTOMY WITH RADIOACTIVE SEED AND SENTINEL LYMPH NODE BIOPSY Right 02/03/2015   Procedure: RIGHT BREAST LUMPECTOMY WITH RADIOACTIVE SEED AND RIGHT SENTINEL LYMPH NODE BIOPSY;  Surgeon: [name], MD;  Location: [place];  Service: General;  Laterality: Right;  . BREAST SURGERY  11/2012   biopsy, benign breast tissue  . COLONOSCOPY  06/2010   rec  . LITHOTRIPSY     kidney stone on left side  . MASTECTOMY W/ SENTINEL NODE BIOPSY Right 03/07/2017   Procedure: RIGHT TOTAL MASTECTOMY WITH RIGHT AXILLARY SENTINEL LYMPH NODE BIOPSY;  Surgeon: [name], MD;  Location: [place];  Service: General;  Laterality: Right;  . OVARIAN CYST REMOVAL  age 33  . POLYPECTOMY  04/2005   colon  . PORTACATH PLACEMENT Left 02/03/2015   Procedure: INSERTION PORT-A-CATH WITH ULTRA SOUND GUIDANCE;  Surgeon: [name], MD;  Location: [place];  Service: General;  Laterality: Left;  . reexcision  10/09/2017   Dr. Lucia Gaskins, chest wall excision for recurrent breast cancer.     FAMILY HISTORY: family history includes Breast cancer (age of onset: 65) in her sister; Breast cancer (age of onset: 3) in her mother; Colon cancer (age of onset: 25) in her sister; Dementia in her father; Heart disease in her father; Hypertension in her mother; Lymphoma (age of onset: 31) in her sister; Melanoma in her sister; Osteoporosis in her mother; Skin cancer in her brother and daughter; Throat cancer (age of onset: 36) in her brother.  SOCIAL HISTORY:  reports that she has never smoked. She has never used smokeless tobacco. She reports that she drinks about 7.0 standard drinks of alcohol per week. She reports that she does not use drugs.  ALLERGIES: Patient has no known allergies.  MEDICATIONS:  Current Outpatient Medications  Medication Sig Dispense Refill  . Biotin 5000 MCG CAPS Take by mouth.    . calcium-vitamin D (OSCAL  WITH D) 250-125 MG-UNIT tablet Take 1 tablet by mouth daily. Patient takes calcium 1256m plus vitamin D3 1,000 IU    . cholecalciferol (VITAMIN D) 1000 units tablet Take 2,000 Units by mouth daily.    . CRESTOR 20 MG tablet Take 1 tablet by mouth daily.  0  . hydrochlorothiazide (MICROZIDE) 12.5 MG capsule Take 12.5 mg by mouth daily.     .Marland Kitchenlosartan (COZAAR) 100 MG tablet Take 100 mg by mouth daily.  0  . OXYQUINOLONE SULFATE VAGINAL (TRIMO-SAN) 0.025 % GEL Place 1 application vaginally once a week.     No current facility-administered medications for this encounter.     REVIEW OF SYSTEMS: as above   PHYSICAL EXAM:  height is 5' 8.5" (1.74 m) and weight is 146 lb 4 oz (66.3 kg). Her oral temperature is 98.2 F (36.8 C). Her blood pressure is 130/97 (abnormal) and her pulse is 73. Her respiration is 18 and oxygen saturation is 94%.   General: Alert and oriented, in no acute distress. HEENT: Head is normocephalic. Extraocular movements are intact. Oropharynx is clear. Neck: Neck is supple, no palpable cervical or supraclavicular lymphadenopathy. Heart: Reg ECOG = 0  0 - Asymptomatic (Fully active, able to carry on all predisease activities without restriction)  1 - Symptomatic but completely ambulatory (Restricted in physically strenuous activity but ambulatory and able to carry out work of a light or  sedentary nature. For example, light housework, office work)  2 - Symptomatic, <50% in bed during the day (Ambulatory and capable of all self care but unable to carry out any work activities. Up and about more than 50% of waking hours)  3 - Symptomatic, >50% in bed, but not bedbound (Capable of only limited self-care, confined to bed or chair 50% or more of waking hours)  4 - Bedbound (Completely disabled. Cannot carry on any self-care. Totally confined to bed or chair)  5 - Death   Eustace Pen MM, Creech RH, Tormey DC, et al. [phone]). "Toxicity and response criteria of the [facility]". [place] Oncol. 5 (6): 649-55   LABORATORY DATA:  Lab Results  Component Value Date   WBC 3.1 (L) 03/07/2017   HGB 13.3 03/07/2017   HCT 38.9 03/07/2017   MCV 99.7 03/07/2017   PLT 169 03/07/2017   CMP     Component Value Date/Time   NA 140 03/07/2017 0630   NA 140 01/25/2016 0916   K 3.4 (L) 03/07/2017 0630   K 3.8 01/25/2016 0916   CL 107 03/07/2017 0630   CO2 22 03/07/2017 0630   CO2 24 01/25/2016 0916   GLUCOSE 82 03/07/2017 0630   GLUCOSE 79 01/25/2016 0916   BUN 26 (H) 03/07/2017 0630   BUN 30.7 (H) 01/25/2016 0916   CREATININE 0.94 03/07/2017 0630   CREATININE 1.0 01/25/2016 0916   CALCIUM 9.2 03/07/2017 0630   CALCIUM 9.8 01/25/2016 0916   PROT 6.5 01/25/2016 0916   ALBUMIN 3.9 01/25/2016 0916   AST 33 01/25/2016 0916   ALT 33 01/25/2016 0916   ALKPHOS 113 01/25/2016 0916   BILITOT 0.69 01/25/2016 0916   GFRNONAA 58 (L) 03/07/2017 0630   GFRAA >60 03/07/2017 0630         RADIOGRAPHY: Nm Pet Image Restag (ps) Skull Base To Thigh  Result Date: 10/25/2017 CLINICAL DATA:  Subsequent treatment strategy for right breast cancer. EXAM: NUCLEAR MEDICINE PET SKULL BASE TO THIGH TECHNIQUE: 7.4 mCi F-18 FDG was injected intravenously. Full-ring PET imaging was performed from the skull base to thigh after the radiotracer. CT data was obtained and used for attenuation correction and anatomic localization. Fasting blood glucose: 92 mg/dl COMPARISON:  02/15/2017 FINDINGS: Mediastinal blood pool activity: SUV max 2.1 NECK: Symmetric activity along the palate and lingual tonsils is likely physiologic. Incidental CT findings: There is atherosclerotic calcification of the cavernous carotid arteries bilaterally. CHEST: Interval right mastectomy. There is some subtle cutaneous activity along the medial margin of the mastectomy and anterior to the sternum without overt nodularity, maximum SUV in this vicinity 3.0. The previous more nodular activity medially along the  right breast prior to mastectomy had a maximum SUV of 4.3. A left axillary lymph node measuring 0.8 cm in short axis has a fatty hilum but slightly more solidity that on the 02/15/2017 exam, maximum SUV 2.0 and previously 0.7 There is a long region of esophageal activity, with mid esophageal activity up to a maximum SUV of 4.4 and distal esophageal activity up to 4.6. Most likely physiologic given the long region of involvement. Incidental CT findings: Mild interstitial accentuation in the lung apices. Slight nodularity for example including a 3 mm nodule in the right upper lobe on image 15/8 which is not appreciably hypermetabolic but which is below sensitive PET-CT size thresholds. Biapical pleuroparenchymal scarring. Coronary, aortic arch, and branch vessel atherosclerotic vascular disease. Mild cardiomegaly. ABDOMEN/PELVIS: Fake accentuated activity in segment 4b of  the liver adjacent to the gallbladder without corresponding lesion identified, probably incidental. Incidental CT findings: Accentuated density in the gallbladder potentially from sludge or gallstones. Parapelvic cysts in the kidneys. Aortoiliac atherosclerotic vascular disease. Vaginal ring/pessary noted. Sigmoid colon diverticulosis. SKELETON: Faintly accentuated activity at sites of arthropathy along degenerated cervical facet joints and along the right sternoclavicular joint. No findings characteristic of osseous malignancy. Incidental CT findings: Failure of fusion of the posterior arch of C1 incidentally noted. IMPRESSION: 1. Interval mastectomy with removal of the nodular region along the right medial breast. There is some linear bandlike accentuated activity along the medial margin of the mastectomy site with maximum SUV up to 3.0, likely meriting observation but most likely benign. 2. A left axillary lymph node measuring only 0.8 cm in short axis has a more solid appearance on the prior exam and currently has a maximum SUV of 2.0, previously  0.7. The current activity is similar to background blood pool. Surveillance is likely warranted. 3. Other imaging findings of potential clinical significance: Aortic Atherosclerosis (ICD10-I70.0). Coronary atherosclerosis with mild cardiomegaly. Sigmoid colon diverticulosis. Sludge or gallstones in the gallbladder. Faint reticulonodular prominence in the lung apices. Electronically Signed   By: Van Clines M.D.   On: 10/25/2017 02:31      IMPRESSION/PLAN: multiply recurrent right breast cancer    Today we talked about the option of reirradiation.  I recommend treating her right chest wall and regional nodes.  The risk of injury from reirradiation is, in my opinion, low compared to the risk of cancer recurrence.  We discussed the data from the publication by Lester Kinsman al which demonstrated that among  multiple institutions, reradiation of the chest wall is generally effective and well-tolerated. The patient understands that the side effects may be increased due to prior radiation  but wishes to proceed.  It was a pleasure meeting the patient today. We discussed the risks, benefits, and side effects of radiotherapy. I recommend radiotherapy to the right chest wall and axilla/ supraclavicular region.  We discussed that radiation would take approximately 6.5 weeks to complete. We spoke about acute effects including skin irritation and fatigue as well as much less common late effects including internal organ injury or irritation. We spoke about the latest technology that is used to minimize the risk of late effects for patients undergoing radiotherapy to the breast or chest wall. No guarantees of treatment were given. The patient is enthusiastic about proceeding with treatment. We discussed and signed the radiation oncology consent form, and a copy was retained for our records. I look forward to participating in the patient's care.  She will undergo CT simulation/treatment planning today with treatment to  begin in approximately 1 week.   I spent 40 minutes face to face with the patient and more than 50% of that time was spent in counseling and/or coordination of care.   __________________________________________   Eppie Gibson, MD  This document serves as a record of services personally performed by Eppie Gibson, MD. It was created on her behalf by Rae Lips, a trained medical scribe. The creation of this record is based on the scribe's personal observations and the provider's statements to them. This document has been checked and approved by the attending provider.

## 2017-11-07 NOTE — Progress Notes (Signed)
  Radiation Oncology         [phone] ________________________________  Name: Shawna [name] MRN: [id number]  Date: 11/07/2017  DOB: [birth year]  SIMULATION AND TREATMENT PLANNING NOTE  // Special Treatment Procedure Note  Outpatient  DIAGNOSIS:     ICD-10-CM   1. Recurrent breast cancer, right ([place]) C50.911   2. Malignant neoplasm of lower-inner quadrant of right breast of female, estrogen receptor negative ([place]) C50.311    Z17.1     NARRATIVE:  The patient was brought to the [facility].  Identity was confirmed.  All relevant records and images related to the planned course of therapy were reviewed.  The patient freely provided informed written consent to proceed with treatment after reviewing the details related to the planned course of therapy. The consent form was witnessed and verified by the simulation staff.    Then, the patient was set-up in a stable reproducible supine position for radiation therapy with her ipsilateral arm over her head, and her upper body secured in a custom-made Vac-lok device.  CT images were obtained.  Surface markings were placed.  The CT images were loaded into the planning software.    TREATMENT PLANNING NOTE: Treatment planning then occurred.  The radiation prescription was entered and confirmed.     A total of 5 medically necessary complex treatment devices were fabricated and supervised by me: 4 fields with MLCs for custom blocks to protect heart, and lungs;  and, a Vac-lok. MORE COMPLEX DEVICES MAY BE MADE IN DOSIMETRY FOR FIELD IN FIELD BEAMS FOR DOSE HOMOGENEITY.  I have requested : 3D Simulation which is medically necessary to give adequate dose to at risk tissues while sparing lungs and heart.  I have requested a DVH of the following structures: lungs, heart, IM nodes, esophagus, cord.    The patient will receive 50.4 Gy in 28 fractions to the right chest wall and IM nodes and SCV/ axillary nodes with 4 fields.  This will be  followed by a boost.  Optical Surface Tracking Plan:  Since intensity modulated radiotherapy (IMRT) and 3D conformal radiation treatment methods are predicated on accurate and precise positioning for treatment, intrafraction motion monitoring is medically necessary to ensure accurate and safe treatment delivery. The ability to quantify intrafraction motion without excessive ionizing radiation dose can only be performed with optical surface tracking. Accordingly, surface imaging offers the opportunity to obtain 3D measurements of patient position throughout IMRT and 3D treatments without excessive radiation exposure. I am ordering optical surface tracking for this patient's upcoming course of radiotherapy.  ________________________________   Reference:  Ursula Alert, J, et al. Surface imaging-based analysis of intrafraction motion for breast radiotherapy patients.Journal of [place], n. 6, nov. 2014. ISSN [id number].  Available at: <http://www.jacmp.org/index.php/jacmp/article/view/4957>.   Special Treatment Procedure Note: The patient received prior radiotherapy in   her current fields. There will be some overlap of radiation dose.  Prior regional radiotherapy increases the risk of side effects from treatment. I have considered this in the treatment planning process and have aimed to minimize tissue overlap.  This increases the complexity of this patient's treatment and therefore this constitutes a special treatment procedure. -----------------------------------  Eppie Gibson, MD

## 2017-11-08 DIAGNOSIS — C50311 Malignant neoplasm of lower-inner quadrant of right female breast: Secondary | ICD-10-CM | POA: Diagnosis not present

## 2017-11-08 DIAGNOSIS — C7989 Secondary malignant neoplasm of other specified sites: Secondary | ICD-10-CM | POA: Diagnosis not present

## 2017-11-08 DIAGNOSIS — Z510 Encounter for antineoplastic radiation therapy: Secondary | ICD-10-CM | POA: Diagnosis not present

## 2017-11-08 DIAGNOSIS — Z171 Estrogen receptor negative status [ER-]: Secondary | ICD-10-CM | POA: Diagnosis not present

## 2017-11-09 DIAGNOSIS — I10 Essential (primary) hypertension: Secondary | ICD-10-CM | POA: Diagnosis not present

## 2017-11-09 DIAGNOSIS — E559 Vitamin D deficiency, unspecified: Secondary | ICD-10-CM | POA: Diagnosis not present

## 2017-11-09 DIAGNOSIS — Z23 Encounter for immunization: Secondary | ICD-10-CM | POA: Diagnosis not present

## 2017-11-09 DIAGNOSIS — Z0000 Encounter for general adult medical examination without abnormal findings: Secondary | ICD-10-CM | POA: Diagnosis not present

## 2017-11-09 DIAGNOSIS — E7849 Other hyperlipidemia: Secondary | ICD-10-CM | POA: Diagnosis not present

## 2017-11-10 DIAGNOSIS — Z1212 Encounter for screening for malignant neoplasm of rectum: Secondary | ICD-10-CM | POA: Diagnosis not present

## 2017-11-13 ENCOUNTER — Ambulatory Visit
Admission: RE | Admit: 2017-11-13 | Discharge: 2017-11-13 | Disposition: A | Discharge status: Home / Self Care | Payer: Medicare Other | Source: Ambulatory Visit | Attending: Radiation Oncology | Admitting: Radiation Oncology

## 2017-11-13 DIAGNOSIS — Z171 Estrogen receptor negative status [ER-]: Secondary | ICD-10-CM | POA: Diagnosis not present

## 2017-11-13 DIAGNOSIS — C50911 Malignant neoplasm of unspecified site of right female breast: Secondary | ICD-10-CM

## 2017-11-13 DIAGNOSIS — C50311 Malignant neoplasm of lower-inner quadrant of right female breast: Secondary | ICD-10-CM | POA: Diagnosis not present

## 2017-11-13 DIAGNOSIS — Z510 Encounter for antineoplastic radiation therapy: Secondary | ICD-10-CM | POA: Diagnosis not present

## 2017-11-13 DIAGNOSIS — C7989 Secondary malignant neoplasm of other specified sites: Secondary | ICD-10-CM | POA: Diagnosis not present

## 2017-11-13 MED ORDER — RADIAPLEXRX EX GEL
Freq: Once | CUTANEOUS | Status: AC
  Administered 2017-11-13: 10:00:00 via TOPICAL

## 2017-11-13 MED ORDER — ALRA NON-METALLIC DEODORANT (RAD-ONC)
1.0000 "application " | Freq: Once | TOPICAL | Status: AC
  Administered 2017-11-13: 1 via TOPICAL

## 2017-11-13 NOTE — Progress Notes (Signed)

## 2017-11-14 ENCOUNTER — Ambulatory Visit
Admission: RE | Admit: 2017-11-14 | Discharge: 2017-11-14 | Disposition: A | Discharge status: Home / Self Care | Payer: Medicare Other | Source: Ambulatory Visit | Attending: Radiation Oncology | Admitting: Radiation Oncology

## 2017-11-14 DIAGNOSIS — C50311 Malignant neoplasm of lower-inner quadrant of right female breast: Secondary | ICD-10-CM | POA: Diagnosis not present

## 2017-11-14 DIAGNOSIS — C7989 Secondary malignant neoplasm of other specified sites: Secondary | ICD-10-CM | POA: Diagnosis not present

## 2017-11-14 DIAGNOSIS — Z171 Estrogen receptor negative status [ER-]: Secondary | ICD-10-CM | POA: Diagnosis not present

## 2017-11-14 DIAGNOSIS — Z510 Encounter for antineoplastic radiation therapy: Secondary | ICD-10-CM | POA: Diagnosis not present

## 2017-11-15 ENCOUNTER — Ambulatory Visit
Admission: RE | Admit: 2017-11-15 | Discharge: 2017-11-15 | Disposition: A | Discharge status: Home / Self Care | Payer: Medicare Other | Source: Ambulatory Visit | Attending: Radiation Oncology | Admitting: Radiation Oncology

## 2017-11-15 DIAGNOSIS — Z171 Estrogen receptor negative status [ER-]: Secondary | ICD-10-CM | POA: Diagnosis not present

## 2017-11-15 DIAGNOSIS — Z510 Encounter for antineoplastic radiation therapy: Secondary | ICD-10-CM | POA: Diagnosis not present

## 2017-11-15 DIAGNOSIS — C7989 Secondary malignant neoplasm of other specified sites: Secondary | ICD-10-CM | POA: Diagnosis not present

## 2017-11-15 DIAGNOSIS — C50311 Malignant neoplasm of lower-inner quadrant of right female breast: Secondary | ICD-10-CM | POA: Diagnosis not present

## 2017-11-16 ENCOUNTER — Ambulatory Visit
Admission: RE | Admit: 2017-11-16 | Discharge: 2017-11-16 | Disposition: A | Discharge status: Home / Self Care | Payer: Medicare Other | Source: Ambulatory Visit | Attending: Radiation Oncology | Admitting: Radiation Oncology

## 2017-11-16 DIAGNOSIS — Z171 Estrogen receptor negative status [ER-]: Secondary | ICD-10-CM | POA: Diagnosis not present

## 2017-11-16 DIAGNOSIS — Z510 Encounter for antineoplastic radiation therapy: Secondary | ICD-10-CM | POA: Diagnosis not present

## 2017-11-16 DIAGNOSIS — C7989 Secondary malignant neoplasm of other specified sites: Secondary | ICD-10-CM | POA: Diagnosis not present

## 2017-11-16 DIAGNOSIS — C50311 Malignant neoplasm of lower-inner quadrant of right female breast: Secondary | ICD-10-CM | POA: Diagnosis not present

## 2017-11-17 ENCOUNTER — Ambulatory Visit
Admission: RE | Admit: 2017-11-17 | Discharge: 2017-11-17 | Disposition: A | Discharge status: Home / Self Care | Payer: Medicare Other | Source: Ambulatory Visit | Attending: Radiation Oncology | Admitting: Radiation Oncology

## 2017-11-17 DIAGNOSIS — Z171 Estrogen receptor negative status [ER-]: Secondary | ICD-10-CM | POA: Diagnosis not present

## 2017-11-17 DIAGNOSIS — C50311 Malignant neoplasm of lower-inner quadrant of right female breast: Secondary | ICD-10-CM | POA: Insufficient documentation

## 2017-11-17 DIAGNOSIS — C7989 Secondary malignant neoplasm of other specified sites: Secondary | ICD-10-CM | POA: Diagnosis not present

## 2017-11-17 DIAGNOSIS — Z510 Encounter for antineoplastic radiation therapy: Secondary | ICD-10-CM | POA: Insufficient documentation

## 2017-11-20 ENCOUNTER — Ambulatory Visit
Admission: RE | Admit: 2017-11-20 | Discharge: 2017-11-20 | Disposition: A | Discharge status: Home / Self Care | Payer: Medicare Other | Source: Ambulatory Visit | Attending: Radiation Oncology | Admitting: Radiation Oncology

## 2017-11-20 DIAGNOSIS — C50311 Malignant neoplasm of lower-inner quadrant of right female breast: Secondary | ICD-10-CM | POA: Diagnosis not present

## 2017-11-20 DIAGNOSIS — C7989 Secondary malignant neoplasm of other specified sites: Secondary | ICD-10-CM | POA: Diagnosis not present

## 2017-11-20 DIAGNOSIS — Z510 Encounter for antineoplastic radiation therapy: Secondary | ICD-10-CM | POA: Diagnosis not present

## 2017-11-20 DIAGNOSIS — Z171 Estrogen receptor negative status [ER-]: Secondary | ICD-10-CM | POA: Diagnosis not present

## 2017-11-21 ENCOUNTER — Ambulatory Visit
Admission: RE | Admit: 2017-11-21 | Discharge: 2017-11-21 | Disposition: A | Discharge status: Home / Self Care | Payer: Medicare Other | Source: Ambulatory Visit | Attending: Radiation Oncology | Admitting: Radiation Oncology

## 2017-11-21 DIAGNOSIS — C7989 Secondary malignant neoplasm of other specified sites: Secondary | ICD-10-CM | POA: Diagnosis not present

## 2017-11-21 DIAGNOSIS — Z171 Estrogen receptor negative status [ER-]: Secondary | ICD-10-CM | POA: Diagnosis not present

## 2017-11-21 DIAGNOSIS — C50311 Malignant neoplasm of lower-inner quadrant of right female breast: Secondary | ICD-10-CM | POA: Diagnosis not present

## 2017-11-21 DIAGNOSIS — Z510 Encounter for antineoplastic radiation therapy: Secondary | ICD-10-CM | POA: Diagnosis not present

## 2017-11-22 ENCOUNTER — Ambulatory Visit
Admission: RE | Admit: 2017-11-22 | Discharge: 2017-11-22 | Disposition: A | Discharge status: Home / Self Care | Payer: Medicare Other | Source: Ambulatory Visit | Attending: Radiation Oncology | Admitting: Radiation Oncology

## 2017-11-22 DIAGNOSIS — C7989 Secondary malignant neoplasm of other specified sites: Secondary | ICD-10-CM | POA: Diagnosis not present

## 2017-11-22 DIAGNOSIS — Z171 Estrogen receptor negative status [ER-]: Secondary | ICD-10-CM | POA: Diagnosis not present

## 2017-11-22 DIAGNOSIS — C50311 Malignant neoplasm of lower-inner quadrant of right female breast: Secondary | ICD-10-CM | POA: Diagnosis not present

## 2017-11-22 DIAGNOSIS — Z510 Encounter for antineoplastic radiation therapy: Secondary | ICD-10-CM | POA: Diagnosis not present

## 2017-11-23 ENCOUNTER — Ambulatory Visit
Admission: RE | Admit: 2017-11-23 | Discharge: 2017-11-23 | Disposition: A | Discharge status: Home / Self Care | Payer: Medicare Other | Source: Ambulatory Visit | Attending: Radiation Oncology | Admitting: Radiation Oncology

## 2017-11-23 DIAGNOSIS — Z171 Estrogen receptor negative status [ER-]: Secondary | ICD-10-CM | POA: Diagnosis not present

## 2017-11-23 DIAGNOSIS — C7989 Secondary malignant neoplasm of other specified sites: Secondary | ICD-10-CM | POA: Diagnosis not present

## 2017-11-23 DIAGNOSIS — C50311 Malignant neoplasm of lower-inner quadrant of right female breast: Secondary | ICD-10-CM | POA: Diagnosis not present

## 2017-11-23 DIAGNOSIS — Z510 Encounter for antineoplastic radiation therapy: Secondary | ICD-10-CM | POA: Diagnosis not present

## 2017-11-24 ENCOUNTER — Ambulatory Visit
Admission: RE | Admit: 2017-11-24 | Discharge: 2017-11-24 | Disposition: A | Discharge status: Home / Self Care | Payer: Medicare Other | Source: Ambulatory Visit | Attending: Radiation Oncology | Admitting: Radiation Oncology

## 2017-11-24 DIAGNOSIS — C50311 Malignant neoplasm of lower-inner quadrant of right female breast: Secondary | ICD-10-CM | POA: Diagnosis not present

## 2017-11-24 DIAGNOSIS — Z171 Estrogen receptor negative status [ER-]: Secondary | ICD-10-CM | POA: Diagnosis not present

## 2017-11-24 DIAGNOSIS — Z510 Encounter for antineoplastic radiation therapy: Secondary | ICD-10-CM | POA: Diagnosis not present

## 2017-11-24 DIAGNOSIS — C7989 Secondary malignant neoplasm of other specified sites: Secondary | ICD-10-CM | POA: Diagnosis not present

## 2017-11-27 ENCOUNTER — Ambulatory Visit
Admission: RE | Admit: 2017-11-27 | Discharge: 2017-11-27 | Disposition: A | Discharge status: Home / Self Care | Payer: Medicare Other | Source: Ambulatory Visit | Attending: Radiation Oncology | Admitting: Radiation Oncology

## 2017-11-27 ENCOUNTER — Telehealth: Payer: Self-pay | Admitting: Medical

## 2017-11-27 DIAGNOSIS — C7989 Secondary malignant neoplasm of other specified sites: Secondary | ICD-10-CM | POA: Diagnosis not present

## 2017-11-27 DIAGNOSIS — C50311 Malignant neoplasm of lower-inner quadrant of right female breast: Secondary | ICD-10-CM | POA: Diagnosis not present

## 2017-11-27 DIAGNOSIS — Z171 Estrogen receptor negative status [ER-]: Secondary | ICD-10-CM | POA: Diagnosis not present

## 2017-11-27 DIAGNOSIS — Z510 Encounter for antineoplastic radiation therapy: Secondary | ICD-10-CM | POA: Diagnosis not present

## 2017-11-27 NOTE — Telephone Encounter (Signed)
LVm for pt regarding appts per 11/11 sch message

## 2017-11-28 ENCOUNTER — Inpatient Hospital Stay: Payer: Medicare Other | Attending: Hematology and Oncology | Admitting: Medical

## 2017-11-28 ENCOUNTER — Ambulatory Visit
Admission: RE | Admit: 2017-11-28 | Discharge: 2017-11-28 | Disposition: A | Discharge status: Home / Self Care | Payer: Medicare Other | Source: Ambulatory Visit | Attending: Radiation Oncology | Admitting: Radiation Oncology

## 2017-11-28 VITALS — BP 130/76 | HR 65 | Temp 98.1°F | Resp 18 | Ht 68.5 in | Wt 143.5 lb

## 2017-11-28 DIAGNOSIS — Z853 Personal history of malignant neoplasm of breast: Secondary | ICD-10-CM | POA: Diagnosis not present

## 2017-11-28 DIAGNOSIS — R0602 Shortness of breath: Secondary | ICD-10-CM | POA: Diagnosis not present

## 2017-11-28 DIAGNOSIS — Z923 Personal history of irradiation: Secondary | ICD-10-CM | POA: Diagnosis not present

## 2017-11-28 DIAGNOSIS — Z171 Estrogen receptor negative status [ER-]: Secondary | ICD-10-CM | POA: Insufficient documentation

## 2017-11-28 DIAGNOSIS — R0600 Dyspnea, unspecified: Secondary | ICD-10-CM

## 2017-11-28 DIAGNOSIS — R0609 Other forms of dyspnea: Secondary | ICD-10-CM

## 2017-11-28 DIAGNOSIS — C7989 Secondary malignant neoplasm of other specified sites: Secondary | ICD-10-CM | POA: Diagnosis not present

## 2017-11-28 DIAGNOSIS — C50311 Malignant neoplasm of lower-inner quadrant of right female breast: Secondary | ICD-10-CM | POA: Diagnosis not present

## 2017-11-28 DIAGNOSIS — Z510 Encounter for antineoplastic radiation therapy: Secondary | ICD-10-CM | POA: Diagnosis not present

## 2017-11-28 DIAGNOSIS — C50911 Malignant neoplasm of unspecified site of right female breast: Secondary | ICD-10-CM

## 2017-11-28 NOTE — Progress Notes (Signed)
[facility] Progress Note   Shawna Shawna [name] [id number] 1940-05-07 77 y.o.  Shawna Shawna [name] is managed by Dr. Lindi Adie and Dr. [name]  Actively treated with chemotherapy/immunotherapy: Shawna Shawna [name] is being treated with radiation therapy   Assessment: Plan:    Recurrent breast cancer, right ([place]) - Plan: CBC with Differential ([place] Only), CMP ([place] only), DG Chest 2 View  Dyspnea on exertion - Plan: CBC with Differential ([place]), CMP ([place] only), DG Chest 2 View   History of right recurrent breast cancer: Shawna Shawna [name] is being treated with radiation therapy.  She will proceed with her treatment today as scheduled.  Dyspnea on exertion: Shawna Shawna [name] is seen today per Shawna request of Dr. [name].  Shawna Shawna [name] mentioned to Dr. [name] yesterday that she had shortness of breath with activity.  Shawna Shawna [name] is unable to have labs or a chest x-ray completed today but agrees to return tomorrow to have these done.  Please see After Visit Summary for Shawna [name] specific instructions.  Future Appointments  Date Time Provider [place]  11/29/2017  2:45 PM [facility] LINAC 3 [facility] None  11/30/2017  9:15 AM [facility] LINAC 3 [facility] None  12/01/2017  9:15 AM [facility] LINAC 3 [facility] None  12/04/2017  9:45 AM [facility] LINAC 3 [facility] None  12/05/2017  8:45 AM [facility] LINAC 3 [facility] None  12/06/2017  8:45 AM [facility] LINAC 3 [facility] None  12/07/2017  8:45 AM [facility] LINAC 3 [facility] None  12/08/2017  8:45 AM [facility] LINAC 3 [facility] None  12/11/2017  9:15 AM [facility] LINAC 3 [facility] None  12/11/2017  9:30 AM Eppie Gibson, MD [facility] None  12/11/2017  3:00 PM Salvadore Dom, MD [place] None  12/12/2017  9:15 AM [facility] LINAC 3 [facility] None  12/13/2017  9:15 AM [facility] LINAC 3 [facility] None  12/18/2017  9:15 AM [facility] LINAC 3 [facility] None    12/18/2017  9:30 AM Eppie Gibson, MD [facility] None  12/19/2017  9:15 AM [facility] LINAC 3 [facility] None  12/20/2017  9:15 AM [facility] LINAC 3 [facility] None  12/21/2017  9:15 AM [facility] LINAC 3 [facility] None  12/22/2017  9:15 AM [facility] LINAC 3 [facility] None  12/25/2017  9:15 AM [facility] LINAC 3 [facility] None  12/26/2017  9:15 AM [facility] LINAC 3 [facility] None  12/27/2017  9:15 AM [facility] LINAC 3 [facility] None  12/28/2017  9:15 AM [facility] LINAC 3 [facility] None  12/29/2017  9:15 AM [facility] LINAC 3 [facility] None  05/10/2018  2:30 PM Salvadore Dom, MD [place] None  07/26/2018  2:00 PM [name], MD [facility] None    Orders Placed This Encounter  Procedures  . DG Chest 2 View  . CBC with Differential ([place] Only)  . CMP ([place] only)       Subjective:   Shawna [name] ID:  Shawna Shawna [name] is a 77 y.o. (DOB Oct 02, 1940) female.  Chief Complaint:  Chief Complaint  Shawna [name] presents with  . Shortness of Breath    HPI Shawna Shawna [name] is a 77 year old female with a history of a recurrent ER negative malignant neoplasm of right breast.  She is receiving radiation therapy with Dr. [name].  She presents to Shawna office today for evaluation after stating to Dr. [name] yesterday that she was having some shortness of breath with activity.  She reports also that she has been having some aching below Shawna shoulder blade on her left side.  She denies fevers, chills, sweats,  chest pain, nausea, vomiting, diarrhea, or recent upper respiratory tract symptoms.  She has a history of sinus pressure and postnasal drainage which is not acutely worse.  Medications: I have reviewed Shawna Shawna [name]'s current medications.  Allergies: No Known Allergies  Past Medical History:  Diagnosis Date  . Breast cancer ([place]) 11/2014  . Family history of breast cancer   . Family history of colon cancer   . History of kidney stones   . History of  radiation therapy 08/11/15- 09/23/2015   Right Breast  . Hyperlipidemia   . Hypertension   . Monoallelic mutation of NBN gene   . Osteoporosis   . Skin cancer of nose     Past Surgical History:  Procedure Laterality Date  . APPENDECTOMY    . BREAST LUMPECTOMY WITH RADIOACTIVE SEED AND SENTINEL LYMPH NODE BIOPSY Right 02/03/2015   Procedure: RIGHT BREAST LUMPECTOMY WITH RADIOACTIVE SEED AND RIGHT SENTINEL LYMPH NODE BIOPSY;  Surgeon: [name], MD;  Location: [place];  Service: General;  Laterality: Right;  . BREAST SURGERY  11/2012   biopsy, benign breast tissue  . COLONOSCOPY  06/2010   rec  . LITHOTRIPSY     kidney stone on left side  . MASTECTOMY W/ SENTINEL NODE BIOPSY Right 03/07/2017   Procedure: RIGHT TOTAL MASTECTOMY WITH RIGHT AXILLARY SENTINEL LYMPH NODE BIOPSY;  Surgeon: [name], MD;  Location: [place];  Service: General;  Laterality: Right;  . OVARIAN CYST REMOVAL  age 53  . POLYPECTOMY  04/2005   colon  . PORTACATH PLACEMENT Left 02/03/2015   Procedure: INSERTION PORT-A-CATH WITH ULTRA SOUND GUIDANCE;  Surgeon: [name], MD;  Location: [place];  Service: General;  Laterality: Left;  . reexcision  10/09/2017   Dr. Lucia Gaskins, chest wall excision for recurrent breast cancer.     Family History  Problem Relation Age of Onset  . Colon cancer Sister 94       died at 59  . Breast cancer Mother 12  . Hypertension Mother   . Osteoporosis Mother   . Heart disease Father        CABG  . Dementia Father   . Throat cancer Brother 18  . Breast cancer Sister 52  . Lymphoma Sister 38  . Melanoma Sister   . Skin cancer Brother   . Skin cancer Daughter     Social History   Socioeconomic History  . Marital status: Widowed    Spouse name: Not on file  . Number of children: 2  . Years of education: Not on file  . Highest education level: Not on file  Occupational History  . Not on file  Social Needs  . Financial resource strain:  Not on file  . Food insecurity:    Worry: Not on file    Inability: Not on file  . Transportation needs:    Medical: No    Non-medical: No  Tobacco Use  . Smoking status: Never Smoker  . Smokeless tobacco: Never Used  Substance and Sexual Activity  . Alcohol use: Yes    Alcohol/week: 7.0 standard drinks    Types: 7 Glasses of wine per week    Comment: 6-7 glasses of wine a week  . Drug use: No  . Sexual activity: Not Currently    Partners: Male    Birth control/protection: Post-menopausal  Lifestyle  . Physical activity:    Days per week: Not on file    Minutes per session: Not on file  .  Stress: Not on file  Relationships  . Social connections:    Talks on phone: Not on file    Gets together: Not on file    Attends religious service: Not on file    Active member of club or organization: Not on file    Attends meetings of clubs or organizations: Not on file    Relationship status: Not on file  . Intimate partner violence:    Fear of current or ex partner: No    Emotionally abused: No    Physically abused: No    Forced sexual activity: No  Other Topics Concern  . Not on file  Social History Narrative  . Not on file    Past Medical History, Surgical history, Social history, and Family history were reviewed and updated as appropriate.   Please see review of systems for further details on Shawna Shawna [name]'s review from today.   Review of Systems:  Review of Systems  Constitutional: Negative for chills, diaphoresis and fever.  HENT: Positive for sinus pressure. Negative for trouble swallowing.   Respiratory: Positive for shortness of breath. Negative for cough, choking, chest tightness, wheezing and stridor.   Cardiovascular: Negative for chest pain and palpitations.    Objective:   Physical Exam:  BP 130/76 (BP Location: Left Arm, Shawna [name] Position: Sitting)   Pulse 65   Temp 98.1 F (36.7 C) (Oral)   Resp 18   Ht 5' 8.5" (1.74 m)   Wt 143 lb 8 oz (65.1 kg)    LMP 01/17/1982 (Approximate)   SpO2 100%   BMI 21.50 kg/m  ECOG: 0  Physical Exam  Constitutional: No distress.  HENT:  Head: Normocephalic and atraumatic.  Right Ear: External ear normal.  Left Ear: External ear normal.  Cardiovascular: Normal rate, regular rhythm and normal heart sounds. Exam reveals no gallop and no friction rub.  No murmur heard. Pulmonary/Chest: Effort normal and breath sounds normal. No respiratory distress. She has no wheezes. She has no rales.  Neurological: She is alert. Coordination normal.  Skin: Skin is warm and dry. No rash noted. She is not diaphoretic. No erythema.  Psychiatric: Judgment and thought content normal.    Lab Review:     Component Value Date/Time   NA 140 03/07/2017 0630   NA 140 01/25/2016 0916   K 3.4 (L) 03/07/2017 0630   K 3.8 01/25/2016 0916   CL 107 03/07/2017 0630   CO2 22 03/07/2017 0630   CO2 24 01/25/2016 0916   GLUCOSE 82 03/07/2017 0630   GLUCOSE 79 01/25/2016 0916   BUN 26 (H) 03/07/2017 0630   BUN 30.7 (H) 01/25/2016 0916   CREATININE 0.94 03/07/2017 0630   CREATININE 1.0 01/25/2016 0916   CALCIUM 9.2 03/07/2017 0630   CALCIUM 9.8 01/25/2016 0916   PROT 6.5 01/25/2016 0916   ALBUMIN 3.9 01/25/2016 0916   AST 33 01/25/2016 0916   ALT 33 01/25/2016 0916   ALKPHOS 113 01/25/2016 0916   BILITOT 0.69 01/25/2016 0916   GFRNONAA 58 (L) 03/07/2017 0630   GFRAA >60 03/07/2017 0630       Component Value Date/Time   WBC 3.1 (L) 03/07/2017 0630   RBC 3.90 03/07/2017 0630   HGB 13.3 03/07/2017 0630   HGB 12.6 01/25/2016 0916   HCT 38.9 03/07/2017 0630   HCT 37.3 01/25/2016 0916   PLT 169 03/07/2017 0630   PLT 155 01/25/2016 0916   MCV 99.7 03/07/2017 0630   MCV 98.7 01/25/2016 0916   MCH 34.1 (  H) 03/07/2017 0630   MCHC 34.2 03/07/2017 0630   RDW 13.2 03/07/2017 0630   RDW 13.4 01/25/2016 0916   LYMPHSABS 0.6 (L) 01/25/2016 0916   MONOABS 0.2 01/25/2016 0916   EOSABS 0.1 01/25/2016 0916   BASOSABS 0.0  01/25/2016 0916   -------------------------------  Imaging from last 24 hours (if applicable):  Radiology interpretation: No results found.

## 2017-11-28 NOTE — Patient Instructions (Signed)
Shortness of Breath, Adult  Shortness of breath means you have trouble breathing. Your lungs are organs for breathing.  Follow these instructions at home:  Pay attention to any changes in your symptoms. Take these actions to help with your condition:  ? Do not smoke. Smoking can cause shortness of breath. If you need help to quit smoking, ask your doctor.  ? Avoid things that can make it harder to breathe, such as:  ? Mold.  ? Dust.  ? Air pollution.  ? Chemical smells.  ? Things that can cause allergy symptoms (allergens), if you have allergies.  ? Keep your living space clean and free of mold and dust.  ? Rest as needed. Slowly return to your usual activities.  ? Take over-the-counter and prescription medicines, including oxygen and inhaled medicines, only as told by your doctor.  ? Keep all follow-up visits as told by your doctor. This is important.  Contact a doctor if:  ? Your condition does not get better as soon as expected.  ? You have a hard time doing your normal activities, even after you rest.  ? You have new symptoms.  Get help right away if:  ? You have trouble breathing when you are resting.  ? You feel light-headed or you faint.  ? You have a cough that is not helped by medicines.  ? You cough up blood.  ? You have pain with breathing.  ? You have pain in your chest, arms, shoulders, or belly (abdomen).  ? You have a fever.  ? You cannot walk up stairs.  ? You cannot exercise the way you normally do.  This information is not intended to replace advice given to you by your health care provider. Make sure you discuss any questions you have with your health care provider.  Document Released: 06/22/2007 Document Revised: 01/21/2016 Document Reviewed: 01/21/2016  Elsevier Interactive Patient Education ? 2017 Elsevier Inc.

## 2017-11-29 ENCOUNTER — Ambulatory Visit
Admission: RE | Admit: 2017-11-29 | Discharge: 2017-11-29 | Disposition: A | Discharge status: Home / Self Care | Payer: Medicare Other | Source: Ambulatory Visit | Attending: Radiation Oncology | Admitting: Radiation Oncology

## 2017-11-29 ENCOUNTER — Ambulatory Visit (HOSPITAL_COMMUNITY)
Admission: RE | Admit: 2017-11-29 | Discharge: 2017-11-29 | Disposition: A | Discharge status: Home / Self Care | Payer: Medicare Other | Source: Ambulatory Visit | Attending: Medical | Admitting: Medical

## 2017-11-29 ENCOUNTER — Inpatient Hospital Stay: Payer: Medicare Other | Admitting: Medical

## 2017-11-29 DIAGNOSIS — R0602 Shortness of breath: Secondary | ICD-10-CM | POA: Diagnosis not present

## 2017-11-29 DIAGNOSIS — C50911 Malignant neoplasm of unspecified site of right female breast: Secondary | ICD-10-CM

## 2017-11-29 DIAGNOSIS — Z510 Encounter for antineoplastic radiation therapy: Secondary | ICD-10-CM | POA: Diagnosis not present

## 2017-11-29 DIAGNOSIS — C7989 Secondary malignant neoplasm of other specified sites: Secondary | ICD-10-CM | POA: Diagnosis not present

## 2017-11-29 DIAGNOSIS — C50311 Malignant neoplasm of lower-inner quadrant of right female breast: Secondary | ICD-10-CM | POA: Diagnosis not present

## 2017-11-29 DIAGNOSIS — R0609 Other forms of dyspnea: Secondary | ICD-10-CM | POA: Diagnosis not present

## 2017-11-29 DIAGNOSIS — R0600 Dyspnea, unspecified: Secondary | ICD-10-CM

## 2017-11-29 DIAGNOSIS — Z171 Estrogen receptor negative status [ER-]: Secondary | ICD-10-CM | POA: Diagnosis not present

## 2017-11-29 LAB — CBC WITH DIFFERENTIAL (CANCER CENTER ONLY)
ABS IMMATURE GRANULOCYTES: 0.01 10*3/uL (ref 0.00–0.07)
BASOS ABS: 0 10*3/uL (ref 0.0–0.1)
BASOS PCT: 1 %
EOS ABS: 0.1 10*3/uL (ref 0.0–0.5)
Eosinophils Relative: 3 %
HCT: 37.9 % (ref 36.0–46.0)
Hemoglobin: 13 g/dL (ref 12.0–15.0)
IMMATURE GRANULOCYTES: 0 %
Lymphocytes Relative: 12 %
Lymphs Abs: 0.4 10*3/uL — ABNORMAL LOW (ref 0.7–4.0)
MCH: 33.1 pg (ref 26.0–34.0)
MCHC: 34.3 g/dL (ref 30.0–36.0)
MCV: 96.4 fL (ref 80.0–100.0)
MONOS PCT: 6 %
Monocytes Absolute: 0.2 10*3/uL (ref 0.1–1.0)
NEUTROS PCT: 78 %
NRBC: 0 % (ref 0.0–0.2)
Neutro Abs: 2.8 10*3/uL (ref 1.7–7.7)
PLATELETS: 186 10*3/uL (ref 150–400)
RBC: 3.93 MIL/uL (ref 3.87–5.11)
RDW: 12.6 % (ref 11.5–15.5)
WBC Count: 3.6 10*3/uL — ABNORMAL LOW (ref 4.0–10.5)

## 2017-11-29 LAB — CMP (CANCER CENTER ONLY)
ALBUMIN: 3.9 g/dL (ref 3.5–5.0)
ALK PHOS: 89 U/L (ref 38–126)
ALT: 20 U/L (ref 0–44)
ANION GAP: 8 (ref 5–15)
AST: 24 U/L (ref 15–41)
BILIRUBIN TOTAL: 0.7 mg/dL (ref 0.3–1.2)
BUN: 22 mg/dL (ref 8–23)
CALCIUM: 9.7 mg/dL (ref 8.9–10.3)
CO2: 27 mmol/L (ref 22–32)
Chloride: 106 mmol/L (ref 98–111)
Creatinine: 1 mg/dL (ref 0.44–1.00)
GFR, Estimated: 54 mL/min — ABNORMAL LOW (ref >60)
GLUCOSE: 139 mg/dL — AB (ref 70–99)
POTASSIUM: 3.3 mmol/L — AB (ref 3.5–5.1)
SODIUM: 141 mmol/L (ref 135–145)
TOTAL PROTEIN: 6.9 g/dL (ref 6.5–8.1)

## 2017-11-29 NOTE — Progress Notes (Signed)
These results were called to [name] and were reviewed with her . Her were answered. She expressed understanding.

## 2017-11-30 ENCOUNTER — Ambulatory Visit
Admission: RE | Admit: 2017-11-30 | Discharge: 2017-11-30 | Disposition: A | Discharge status: Home / Self Care | Payer: Medicare Other | Source: Ambulatory Visit | Attending: Radiation Oncology | Admitting: Radiation Oncology

## 2017-11-30 DIAGNOSIS — C7989 Secondary malignant neoplasm of other specified sites: Secondary | ICD-10-CM | POA: Diagnosis not present

## 2017-11-30 DIAGNOSIS — C50311 Malignant neoplasm of lower-inner quadrant of right female breast: Secondary | ICD-10-CM | POA: Diagnosis not present

## 2017-11-30 DIAGNOSIS — Z510 Encounter for antineoplastic radiation therapy: Secondary | ICD-10-CM | POA: Diagnosis not present

## 2017-11-30 DIAGNOSIS — Z171 Estrogen receptor negative status [ER-]: Secondary | ICD-10-CM | POA: Diagnosis not present

## 2017-12-01 ENCOUNTER — Ambulatory Visit
Admission: RE | Admit: 2017-12-01 | Discharge: 2017-12-01 | Disposition: A | Discharge status: Home / Self Care | Payer: Medicare Other | Source: Ambulatory Visit | Attending: Radiation Oncology | Admitting: Radiation Oncology

## 2017-12-01 DIAGNOSIS — Z171 Estrogen receptor negative status [ER-]: Secondary | ICD-10-CM | POA: Diagnosis not present

## 2017-12-01 DIAGNOSIS — C7989 Secondary malignant neoplasm of other specified sites: Secondary | ICD-10-CM | POA: Diagnosis not present

## 2017-12-01 DIAGNOSIS — C50311 Malignant neoplasm of lower-inner quadrant of right female breast: Secondary | ICD-10-CM | POA: Diagnosis not present

## 2017-12-01 DIAGNOSIS — Z510 Encounter for antineoplastic radiation therapy: Secondary | ICD-10-CM | POA: Diagnosis not present

## 2017-12-04 ENCOUNTER — Ambulatory Visit
Admission: RE | Admit: 2017-12-04 | Discharge: 2017-12-04 | Disposition: A | Discharge status: Home / Self Care | Payer: Medicare Other | Source: Ambulatory Visit | Attending: Radiation Oncology | Admitting: Radiation Oncology

## 2017-12-04 DIAGNOSIS — Z510 Encounter for antineoplastic radiation therapy: Secondary | ICD-10-CM | POA: Diagnosis not present

## 2017-12-04 DIAGNOSIS — C50311 Malignant neoplasm of lower-inner quadrant of right female breast: Secondary | ICD-10-CM | POA: Diagnosis not present

## 2017-12-04 DIAGNOSIS — C7989 Secondary malignant neoplasm of other specified sites: Secondary | ICD-10-CM | POA: Diagnosis not present

## 2017-12-04 DIAGNOSIS — Z171 Estrogen receptor negative status [ER-]: Secondary | ICD-10-CM | POA: Diagnosis not present

## 2017-12-05 ENCOUNTER — Ambulatory Visit
Admission: RE | Admit: 2017-12-05 | Discharge: 2017-12-05 | Disposition: A | Discharge status: Home / Self Care | Payer: Medicare Other | Source: Ambulatory Visit | Attending: Radiation Oncology | Admitting: Radiation Oncology

## 2017-12-05 DIAGNOSIS — Z171 Estrogen receptor negative status [ER-]: Secondary | ICD-10-CM | POA: Diagnosis not present

## 2017-12-05 DIAGNOSIS — C7989 Secondary malignant neoplasm of other specified sites: Secondary | ICD-10-CM | POA: Diagnosis not present

## 2017-12-05 DIAGNOSIS — Z510 Encounter for antineoplastic radiation therapy: Secondary | ICD-10-CM | POA: Diagnosis not present

## 2017-12-05 DIAGNOSIS — C50311 Malignant neoplasm of lower-inner quadrant of right female breast: Secondary | ICD-10-CM | POA: Diagnosis not present

## 2017-12-06 ENCOUNTER — Ambulatory Visit
Admission: RE | Admit: 2017-12-06 | Discharge: 2017-12-06 | Disposition: A | Discharge status: Home / Self Care | Payer: Medicare Other | Source: Ambulatory Visit | Attending: Radiation Oncology | Admitting: Radiation Oncology

## 2017-12-06 DIAGNOSIS — Z171 Estrogen receptor negative status [ER-]: Secondary | ICD-10-CM | POA: Diagnosis not present

## 2017-12-06 DIAGNOSIS — C7989 Secondary malignant neoplasm of other specified sites: Secondary | ICD-10-CM | POA: Diagnosis not present

## 2017-12-06 DIAGNOSIS — Z510 Encounter for antineoplastic radiation therapy: Secondary | ICD-10-CM | POA: Diagnosis not present

## 2017-12-06 DIAGNOSIS — C50311 Malignant neoplasm of lower-inner quadrant of right female breast: Secondary | ICD-10-CM | POA: Diagnosis not present

## 2017-12-07 ENCOUNTER — Ambulatory Visit
Admission: RE | Admit: 2017-12-07 | Discharge: 2017-12-07 | Disposition: A | Discharge status: Home / Self Care | Payer: Medicare Other | Source: Ambulatory Visit | Attending: Radiation Oncology | Admitting: Radiation Oncology

## 2017-12-07 ENCOUNTER — Ambulatory Visit: Payer: Medicare Other | Admitting: Obstetrics and Gynecology

## 2017-12-07 DIAGNOSIS — C50311 Malignant neoplasm of lower-inner quadrant of right female breast: Secondary | ICD-10-CM | POA: Diagnosis not present

## 2017-12-07 DIAGNOSIS — Z171 Estrogen receptor negative status [ER-]: Secondary | ICD-10-CM | POA: Diagnosis not present

## 2017-12-07 DIAGNOSIS — C7989 Secondary malignant neoplasm of other specified sites: Secondary | ICD-10-CM | POA: Diagnosis not present

## 2017-12-07 DIAGNOSIS — Z510 Encounter for antineoplastic radiation therapy: Secondary | ICD-10-CM | POA: Diagnosis not present

## 2017-12-08 ENCOUNTER — Ambulatory Visit
Admission: RE | Admit: 2017-12-08 | Discharge: 2017-12-08 | Disposition: A | Discharge status: Home / Self Care | Payer: Medicare Other | Source: Ambulatory Visit | Attending: Radiation Oncology | Admitting: Radiation Oncology

## 2017-12-08 DIAGNOSIS — C50311 Malignant neoplasm of lower-inner quadrant of right female breast: Secondary | ICD-10-CM | POA: Diagnosis not present

## 2017-12-08 DIAGNOSIS — C7989 Secondary malignant neoplasm of other specified sites: Secondary | ICD-10-CM | POA: Diagnosis not present

## 2017-12-08 DIAGNOSIS — Z510 Encounter for antineoplastic radiation therapy: Secondary | ICD-10-CM | POA: Diagnosis not present

## 2017-12-08 DIAGNOSIS — Z171 Estrogen receptor negative status [ER-]: Secondary | ICD-10-CM | POA: Diagnosis not present

## 2017-12-11 ENCOUNTER — Ambulatory Visit
Admission: RE | Admit: 2017-12-11 | Discharge: 2017-12-11 | Disposition: A | Discharge status: Home / Self Care | Payer: Medicare Other | Source: Ambulatory Visit | Attending: Radiation Oncology | Admitting: Radiation Oncology

## 2017-12-11 ENCOUNTER — Encounter: Payer: Self-pay | Admitting: Obstetrics and Gynecology

## 2017-12-11 ENCOUNTER — Other Ambulatory Visit: Payer: Self-pay

## 2017-12-11 ENCOUNTER — Ambulatory Visit: Payer: Medicare Other | Admitting: Obstetrics and Gynecology

## 2017-12-11 VITALS — BP 126/80 | HR 80 | Wt 142.0 lb

## 2017-12-11 DIAGNOSIS — C50311 Malignant neoplasm of lower-inner quadrant of right female breast: Secondary | ICD-10-CM | POA: Diagnosis not present

## 2017-12-11 DIAGNOSIS — Z510 Encounter for antineoplastic radiation therapy: Secondary | ICD-10-CM | POA: Diagnosis not present

## 2017-12-11 DIAGNOSIS — Z4689 Encounter for fitting and adjustment of other specified devices: Secondary | ICD-10-CM | POA: Diagnosis not present

## 2017-12-11 DIAGNOSIS — Z171 Estrogen receptor negative status [ER-]: Secondary | ICD-10-CM | POA: Diagnosis not present

## 2017-12-11 DIAGNOSIS — C7989 Secondary malignant neoplasm of other specified sites: Secondary | ICD-10-CM | POA: Diagnosis not present

## 2017-12-11 NOTE — Progress Notes (Signed)
GYNECOLOGY  VISIT   HPI: 77 y.o.   Widowed White or Caucasian Not Hispanic or Latino  female   [phone] with Patient's last menstrual period was 01/17/1982 (approximate).  Her prolapse is well controlled with #5 ring pessary with support. She uses trimosan vaginally every week to prevent irritation. No bleeding, no bowel or bladder issues. Not sexually active.    here for pessary check.  Has been diagnosed with recurrent breast cancer. Undergoing radiation.  GYNECOLOGIC HISTORY: Patient's last menstrual period was 01/17/1982 (approximate). Contraception: Postmenopausal Menopausal hormone therapy: trimosan gel        OB History    Gravida  2   Para  2   Term  2   Preterm  0   AB  0   Living  2     SAB  0   TAB  0   Ectopic  0   Multiple  0   Live Births  2              Patient Active Problem List   Diagnosis Date Noted  . 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 Trochanteric bursitis of right hip 07/06/2017  . Recurrent breast cancer, right ([place]) 03/07/2017  . Monoallelic mutation of NBN gene   . Genetic testing 01/13/2015  . Family history of breast cancer   . Family history of colon cancer   . Breast cancer of lower-inner quadrant of right female breast ([place]) 12/30/2014    Past Medical History:  Diagnosis Date  . Breast cancer ([place]) 11/2014  . Family history of breast cancer   . Family history of colon cancer   . History of kidney stones   . History of radiation therapy 08/11/15- 09/23/2015   Right Breast  . Hyperlipidemia   . Hypertension   . Monoallelic mutation of NBN gene   . Osteoporosis   . Skin cancer of nose     Past Surgical History:  Procedure Laterality Date  . APPENDECTOMY    . BREAST LUMPECTOMY WITH RADIOACTIVE SEED AND SENTINEL LYMPH NODE BIOPSY Right 02/03/2015   Procedure: RIGHT BREAST LUMPECTOMY WITH RADIOACTIVE SEED AND RIGHT SENTINEL LYMPH NODE BIOPSY;  Surgeon: [name], MD;   Location: [facility];  Service: General;  Laterality: Right;  . BREAST SURGERY  11/2012   biopsy, benign breast tissue  . COLONOSCOPY  06/2010   rec  . LITHOTRIPSY     kidney stone on left side  . MASTECTOMY W/ SENTINEL NODE BIOPSY Right 03/07/2017   Procedure: RIGHT TOTAL MASTECTOMY WITH RIGHT AXILLARY SENTINEL LYMPH NODE BIOPSY;  Surgeon: [name], MD;  Location: [place];  Service: General;  Laterality: Right;  . OVARIAN CYST REMOVAL  age 15  . POLYPECTOMY  04/2005   colon  . PORTACATH PLACEMENT Left 02/03/2015   Procedure: INSERTION PORT-A-CATH WITH ULTRA SOUND GUIDANCE;  Surgeon: [name], MD;  Location: [place];  Service: General;  Laterality: Left;  . reexcision  10/09/2017   Dr. Lucia Gaskins, chest wall excision for recurrent breast cancer.     Current Outpatient Medications  Medication Sig Dispense Refill  . Biotin 5000 MCG CAPS Take by mouth.    . calcium-vitamin D (OSCAL WITH D) 250-125 MG-UNIT tablet Take 1 tablet by mouth daily. Patient takes calcium 1200mg  plus vitamin D3 1,000 IU    . cholecalciferol (VITAMIN D) 1000 units tablet Take 2,000 Units by mouth daily.    . CRESTOR 20  MG tablet Take 1 tablet by mouth daily.  0  . hydrochlorothiazide (MICROZIDE) 12.5 MG capsule Take 12.5 mg by mouth daily.     Marland Kitchen losartan (COZAAR) 100 MG tablet Take 100 mg by mouth daily.  0  . OXYQUINOLONE SULFATE VAGINAL (TRIMO-SAN) 0.025 % GEL Place 1 application vaginally once a week.     No current facility-administered medications for this visit.      ALLERGIES: Patient has no known allergies.  Family History  Problem Relation Age of Onset  . Colon cancer Sister 72       died at 73  . Breast cancer Mother 79  . Hypertension Mother   . Osteoporosis Mother   . Heart disease Father        CABG  . Dementia Father   . Throat cancer Brother 88  . Breast cancer Sister 25  . Lymphoma Sister 77  . Melanoma Sister   . Skin cancer Brother   . Skin cancer  Daughter     Social History   Socioeconomic History  . Marital status: Widowed    Spouse name: Not on file  . Number of children: 2  . Years of education: Not on file  . Highest education level: Not on file  Occupational History  . Not on file  Social Needs  . Financial resource strain: Not on file  . Food insecurity:    Worry: Not on file    Inability: Not on file  . Transportation needs:    Medical: No    Non-medical: No  Tobacco Use  . Smoking status: Never Smoker  . Smokeless tobacco: Never Used  Substance and Sexual Activity  . Alcohol use: Yes  . Drug use: No  . Sexual activity: Not Currently    Partners: Male    Birth control/protection: Post-menopausal  Lifestyle  . Physical activity:    Days per week: Not on file    Minutes per session: Not on file  . Stress: Not on file  Relationships  . Social connections:    Talks on phone: Not on file    Gets together: Not on file    Attends religious service: Not on file    Active member of club or organization: Not on file    Attends meetings of clubs or organizations: Not on file    Relationship status: Not on file  . Intimate partner violence:    Fear of current or ex partner: No    Emotionally abused: No    Physically abused: No    Forced sexual activity: No  Other Topics Concern  . Not on file  Social History Narrative  . Not on file    Review of Systems  Constitutional: Negative.   HENT: Negative.   Eyes: Negative.   Respiratory: Negative.   Cardiovascular: Negative.   Gastrointestinal: Negative.   Genitourinary: Negative.   Musculoskeletal: Negative.   Skin: Positive for itching and rash.  Neurological: Negative.   Endo/Heme/Allergies: Negative.   Psychiatric/Behavioral: Negative.     PHYSICAL EXAMINATION:    BP 126/80 (BP Location: Left Arm, Patient Position: Sitting, Cuff Size: Normal)   Pulse 80   Wt 142 lb (64.4 kg)   LMP 01/17/1982 (Approximate)   BMI 21.28 kg/m     General  appearance: alert, cooperative and appears stated age   Pelvic: External genitalia:  no lesions              Urethra:  normal appearing urethra with no masses,  tenderness or lesions              Bartholins and Skenes: normal                 Vagina: normal appearing vagina with normal color and discharge, no lesions. Pessary removed and cleaned. No vaginal irritation noted. Pessary replaced              Cervix: no lesions               Chaperone was present for exam.  ASSESSMENT Pessary check, doing well Recurrent breast cancer, undergoing radiation    PLAN Continue with the trimosan cream 1 x a week F/U for a pessary check in 3 months   An After Visit Summary was printed and given to the patient.

## 2017-12-12 ENCOUNTER — Ambulatory Visit
Admission: RE | Admit: 2017-12-12 | Discharge: 2017-12-12 | Disposition: A | Discharge status: Home / Self Care | Payer: Medicare Other | Source: Ambulatory Visit | Attending: Radiation Oncology | Admitting: Radiation Oncology

## 2017-12-12 DIAGNOSIS — C7989 Secondary malignant neoplasm of other specified sites: Secondary | ICD-10-CM | POA: Diagnosis not present

## 2017-12-12 DIAGNOSIS — Z510 Encounter for antineoplastic radiation therapy: Secondary | ICD-10-CM | POA: Diagnosis not present

## 2017-12-12 DIAGNOSIS — Z171 Estrogen receptor negative status [ER-]: Secondary | ICD-10-CM | POA: Diagnosis not present

## 2017-12-12 DIAGNOSIS — C50311 Malignant neoplasm of lower-inner quadrant of right female breast: Secondary | ICD-10-CM | POA: Diagnosis not present

## 2017-12-13 ENCOUNTER — Ambulatory Visit
Admission: RE | Admit: 2017-12-13 | Discharge: 2017-12-13 | Disposition: A | Discharge status: Home / Self Care | Payer: Medicare Other | Source: Ambulatory Visit | Attending: Radiation Oncology | Admitting: Radiation Oncology

## 2017-12-13 DIAGNOSIS — Z171 Estrogen receptor negative status [ER-]: Secondary | ICD-10-CM | POA: Diagnosis not present

## 2017-12-13 DIAGNOSIS — C50311 Malignant neoplasm of lower-inner quadrant of right female breast: Secondary | ICD-10-CM | POA: Diagnosis not present

## 2017-12-13 DIAGNOSIS — Z510 Encounter for antineoplastic radiation therapy: Secondary | ICD-10-CM | POA: Diagnosis not present

## 2017-12-13 DIAGNOSIS — C7989 Secondary malignant neoplasm of other specified sites: Secondary | ICD-10-CM | POA: Diagnosis not present

## 2017-12-18 ENCOUNTER — Ambulatory Visit: Payer: Medicare Other | Admitting: Radiation Oncology

## 2017-12-18 ENCOUNTER — Ambulatory Visit
Admission: RE | Admit: 2017-12-18 | Discharge: 2017-12-18 | Disposition: A | Discharge status: Home / Self Care | Payer: Medicare Other | Source: Ambulatory Visit | Attending: Radiation Oncology | Admitting: Radiation Oncology

## 2017-12-18 DIAGNOSIS — Z171 Estrogen receptor negative status [ER-]: Secondary | ICD-10-CM | POA: Diagnosis not present

## 2017-12-18 DIAGNOSIS — Z510 Encounter for antineoplastic radiation therapy: Secondary | ICD-10-CM | POA: Diagnosis not present

## 2017-12-18 DIAGNOSIS — C50311 Malignant neoplasm of lower-inner quadrant of right female breast: Secondary | ICD-10-CM | POA: Insufficient documentation

## 2017-12-18 DIAGNOSIS — C7989 Secondary malignant neoplasm of other specified sites: Secondary | ICD-10-CM | POA: Diagnosis not present

## 2017-12-19 ENCOUNTER — Ambulatory Visit
Admission: RE | Admit: 2017-12-19 | Discharge: 2017-12-19 | Disposition: A | Discharge status: Home / Self Care | Payer: Medicare Other | Source: Ambulatory Visit | Attending: Radiation Oncology | Admitting: Radiation Oncology

## 2017-12-19 DIAGNOSIS — Z171 Estrogen receptor negative status [ER-]: Secondary | ICD-10-CM | POA: Diagnosis not present

## 2017-12-19 DIAGNOSIS — C50311 Malignant neoplasm of lower-inner quadrant of right female breast: Secondary | ICD-10-CM | POA: Diagnosis not present

## 2017-12-19 DIAGNOSIS — C7989 Secondary malignant neoplasm of other specified sites: Secondary | ICD-10-CM | POA: Diagnosis not present

## 2017-12-19 DIAGNOSIS — Z510 Encounter for antineoplastic radiation therapy: Secondary | ICD-10-CM | POA: Diagnosis not present

## 2017-12-20 ENCOUNTER — Ambulatory Visit
Admission: RE | Admit: 2017-12-20 | Discharge: 2017-12-20 | Disposition: A | Discharge status: Home / Self Care | Payer: Medicare Other | Source: Ambulatory Visit | Attending: Radiation Oncology | Admitting: Radiation Oncology

## 2017-12-20 DIAGNOSIS — C50311 Malignant neoplasm of lower-inner quadrant of right female breast: Secondary | ICD-10-CM | POA: Diagnosis not present

## 2017-12-20 DIAGNOSIS — C7989 Secondary malignant neoplasm of other specified sites: Secondary | ICD-10-CM | POA: Diagnosis not present

## 2017-12-20 DIAGNOSIS — Z171 Estrogen receptor negative status [ER-]: Secondary | ICD-10-CM | POA: Diagnosis not present

## 2017-12-20 DIAGNOSIS — Z510 Encounter for antineoplastic radiation therapy: Secondary | ICD-10-CM | POA: Diagnosis not present

## 2017-12-21 ENCOUNTER — Ambulatory Visit
Admission: RE | Admit: 2017-12-21 | Discharge: 2017-12-21 | Disposition: A | Discharge status: Home / Self Care | Payer: Medicare Other | Source: Ambulatory Visit | Attending: Radiation Oncology | Admitting: Radiation Oncology

## 2017-12-21 DIAGNOSIS — C50311 Malignant neoplasm of lower-inner quadrant of right female breast: Secondary | ICD-10-CM | POA: Diagnosis not present

## 2017-12-21 DIAGNOSIS — Z171 Estrogen receptor negative status [ER-]: Secondary | ICD-10-CM | POA: Diagnosis not present

## 2017-12-21 DIAGNOSIS — Z510 Encounter for antineoplastic radiation therapy: Secondary | ICD-10-CM | POA: Diagnosis not present

## 2017-12-21 DIAGNOSIS — C7989 Secondary malignant neoplasm of other specified sites: Secondary | ICD-10-CM | POA: Diagnosis not present

## 2017-12-22 ENCOUNTER — Ambulatory Visit
Admission: RE | Admit: 2017-12-22 | Discharge: 2017-12-22 | Disposition: A | Discharge status: Home / Self Care | Payer: Medicare Other | Source: Ambulatory Visit | Attending: Radiation Oncology | Admitting: Radiation Oncology

## 2017-12-22 DIAGNOSIS — C50911 Malignant neoplasm of unspecified site of right female breast: Secondary | ICD-10-CM

## 2017-12-22 DIAGNOSIS — C50311 Malignant neoplasm of lower-inner quadrant of right female breast: Secondary | ICD-10-CM | POA: Diagnosis not present

## 2017-12-22 DIAGNOSIS — Z171 Estrogen receptor negative status [ER-]: Secondary | ICD-10-CM | POA: Diagnosis not present

## 2017-12-22 DIAGNOSIS — C7989 Secondary malignant neoplasm of other specified sites: Secondary | ICD-10-CM | POA: Diagnosis not present

## 2017-12-22 DIAGNOSIS — Z510 Encounter for antineoplastic radiation therapy: Secondary | ICD-10-CM | POA: Diagnosis not present

## 2017-12-22 MED ORDER — RADIAPLEXRX EX GEL
Freq: Once | CUTANEOUS | Status: AC
  Administered 2017-12-22: 11:00:00 via TOPICAL

## 2017-12-25 ENCOUNTER — Ambulatory Visit
Admission: RE | Admit: 2017-12-25 | Discharge: 2017-12-25 | Disposition: A | Discharge status: Home / Self Care | Payer: Medicare Other | Source: Ambulatory Visit | Attending: Radiation Oncology | Admitting: Radiation Oncology

## 2017-12-25 DIAGNOSIS — Z171 Estrogen receptor negative status [ER-]: Secondary | ICD-10-CM | POA: Diagnosis not present

## 2017-12-25 DIAGNOSIS — C50311 Malignant neoplasm of lower-inner quadrant of right female breast: Secondary | ICD-10-CM | POA: Diagnosis not present

## 2017-12-25 DIAGNOSIS — Z510 Encounter for antineoplastic radiation therapy: Secondary | ICD-10-CM | POA: Diagnosis not present

## 2017-12-26 ENCOUNTER — Ambulatory Visit
Admission: RE | Admit: 2017-12-26 | Discharge: 2017-12-26 | Disposition: A | Discharge status: Home / Self Care | Payer: Medicare Other | Source: Ambulatory Visit | Attending: Radiation Oncology | Admitting: Radiation Oncology

## 2017-12-26 DIAGNOSIS — C50311 Malignant neoplasm of lower-inner quadrant of right female breast: Secondary | ICD-10-CM | POA: Diagnosis not present

## 2017-12-26 DIAGNOSIS — C7989 Secondary malignant neoplasm of other specified sites: Secondary | ICD-10-CM | POA: Diagnosis not present

## 2017-12-26 DIAGNOSIS — Z171 Estrogen receptor negative status [ER-]: Secondary | ICD-10-CM | POA: Diagnosis not present

## 2017-12-26 DIAGNOSIS — Z510 Encounter for antineoplastic radiation therapy: Secondary | ICD-10-CM | POA: Diagnosis not present

## 2017-12-27 ENCOUNTER — Ambulatory Visit
Admission: RE | Admit: 2017-12-27 | Discharge: 2017-12-27 | Disposition: A | Discharge status: Home / Self Care | Payer: Medicare Other | Source: Ambulatory Visit | Attending: Radiation Oncology | Admitting: Radiation Oncology

## 2017-12-27 DIAGNOSIS — C50311 Malignant neoplasm of lower-inner quadrant of right female breast: Secondary | ICD-10-CM | POA: Diagnosis not present

## 2017-12-27 DIAGNOSIS — Z510 Encounter for antineoplastic radiation therapy: Secondary | ICD-10-CM | POA: Diagnosis not present

## 2017-12-27 DIAGNOSIS — Z171 Estrogen receptor negative status [ER-]: Secondary | ICD-10-CM | POA: Diagnosis not present

## 2017-12-28 ENCOUNTER — Ambulatory Visit
Admission: RE | Admit: 2017-12-28 | Discharge: 2017-12-28 | Disposition: A | Discharge status: Home / Self Care | Payer: Medicare Other | Source: Ambulatory Visit | Attending: Radiation Oncology | Admitting: Radiation Oncology

## 2017-12-28 DIAGNOSIS — C50311 Malignant neoplasm of lower-inner quadrant of right female breast: Secondary | ICD-10-CM | POA: Diagnosis not present

## 2017-12-28 DIAGNOSIS — Z510 Encounter for antineoplastic radiation therapy: Secondary | ICD-10-CM | POA: Diagnosis not present

## 2017-12-28 DIAGNOSIS — Z171 Estrogen receptor negative status [ER-]: Secondary | ICD-10-CM | POA: Diagnosis not present

## 2017-12-29 ENCOUNTER — Ambulatory Visit
Admission: RE | Admit: 2017-12-29 | Discharge: 2017-12-29 | Disposition: A | Discharge status: Home / Self Care | Payer: Medicare Other | Source: Ambulatory Visit | Attending: Radiation Oncology | Admitting: Radiation Oncology

## 2017-12-29 ENCOUNTER — Encounter: Payer: Self-pay | Admitting: Radiation Oncology

## 2017-12-29 DIAGNOSIS — C50311 Malignant neoplasm of lower-inner quadrant of right female breast: Secondary | ICD-10-CM | POA: Diagnosis not present

## 2017-12-29 DIAGNOSIS — Z171 Estrogen receptor negative status [ER-]: Secondary | ICD-10-CM | POA: Diagnosis not present

## 2017-12-29 DIAGNOSIS — C7989 Secondary malignant neoplasm of other specified sites: Secondary | ICD-10-CM | POA: Diagnosis not present

## 2017-12-29 DIAGNOSIS — Z510 Encounter for antineoplastic radiation therapy: Secondary | ICD-10-CM | POA: Diagnosis not present

## 2017-12-29 NOTE — Progress Notes (Signed)
  Radiation Oncology         [phone] ________________________________  Name: Shawna [name] MRN: [id number]  Date: 12/29/2017  DOB: 1940/03/07  End of Treatment Note  Diagnosis:   Stage IIIc, pT4b (due to gross involvement of skin), clinically recurrent, pNXpMX, Right Breast Invasive Ductal Carcinoma, ER(-) / PR(-) / Her2(-), Grade 3    Cancer Staging Breast cancer of lower-inner quadrant of right female breast ([place]) Staging form: Breast, AJCC 7th Edition - Pathologic: Stage IIA (T2, N0, cM0) - Unsigned  Recurrent breast cancer, right ([place]) Staging form: Breast, AJCC 8th Edition - Clinical: Stage IIIC (cT4b, cN0, cM0, G3, ER: Negative, PR: Negative, HER2: Negative) - Unsigned  Indication for treatment:  Curative       Radiation treatment dates:   11/13/17 - 12/29/17  Site/dose:   1. Right Chest Wall and IM nodes / 50.4 Gy in 28 fractions 2. Right Supraclavicular and PAB nodes / 50.4 Gy in 28 fractions 3. Right Chest Wall Boost / 10 Gy in 5 fractions   Beams/energy:    1. 3D, photons / 10X//6X 2. 3D, photons / 10X//6X 3. Electrons / 6E  Narrative: The patient tolerated radiation treatment relatively well. By the end of treatments, she was noted to have bright erythema with dry peeling to the right chest wall and resolved moist peeling to the axilla.   Plan: The patient has completed radiation treatment. The patient will return to radiation oncology clinic for routine followup in one month. I advised them to call or return sooner if they have any questions or concerns related to their recovery or treatment.  -----------------------------------  Eppie Gibson, MD  This document serves as a record of services personally performed by Eppie Gibson, MD. It was created on her behalf by Wilburn Mylar, a trained medical scribe. The creation of this record is based on the scribe's personal observations and the provider's statements to them. This document has been checked and  approved by the attending provider.

## 2018-01-04 ENCOUNTER — Ambulatory Visit: Payer: Medicare Other | Admitting: Obstetrics and Gynecology

## 2018-01-04 ENCOUNTER — Encounter: Payer: Self-pay | Admitting: Obstetrics and Gynecology

## 2018-01-04 ENCOUNTER — Other Ambulatory Visit: Payer: Self-pay

## 2018-01-04 VITALS — BP 140/80 | HR 84 | Temp 99.5°F | Wt 142.0 lb

## 2018-01-04 DIAGNOSIS — R350 Frequency of micturition: Secondary | ICD-10-CM | POA: Diagnosis not present

## 2018-01-04 DIAGNOSIS — R3915 Urgency of urination: Secondary | ICD-10-CM

## 2018-01-04 DIAGNOSIS — N3090 Cystitis, unspecified without hematuria: Secondary | ICD-10-CM | POA: Diagnosis not present

## 2018-01-04 LAB — POCT URINALYSIS DIPSTICK
Bilirubin, UA: NEGATIVE
Blood, UA: POSITIVE
GLUCOSE UA: NEGATIVE
Ketones, UA: NEGATIVE
Nitrite, UA: NEGATIVE
Protein, UA: NEGATIVE
SPEC GRAV UA: 1.01 (ref 1.010–1.025)
Urobilinogen, UA: 0.2 E.U./dL
pH, UA: 5 (ref 5.0–8.0)

## 2018-01-04 MED ORDER — PHENAZOPYRIDINE HCL 200 MG PO TABS: 200.0000 mg | ORAL_TABLET | Freq: Three times a day (TID) | ORAL | 0 refills | Status: DC | PRN

## 2018-01-04 MED ORDER — SULFAMETHOXAZOLE-TRIMETHOPRIM 800-160 MG PO TABS: 1.0000 | ORAL_TABLET | Freq: Two times a day (BID) | ORAL | 0 refills | Status: DC

## 2018-01-04 NOTE — Progress Notes (Signed)
GYNECOLOGY  VISIT   HPI: 77 y.o.   Widowed White or Caucasian Not Hispanic or Latino  female   [phone] with Patient's last menstrual period was 01/17/1982 (approximate).  Just finished radiation for recurrent breast cancer last week. Worn out.  here for urinary frequency and urgency for the last month, it has gotten a little worse. Worsening urge incontinence in the last month. Leaking small amounts frequently, wearing a pad, leaks many times a day. She has some dysuria for the last month. No fever, no flank pain.     GYNECOLOGIC HISTORY: Patient's last menstrual period was 01/17/1982 (approximate). Contraception: Postmenopausal Menopausal hormone therapy: None        OB History    Gravida  2   Para  2   Term  2   Preterm  0   AB  0   Living  2     SAB  0   TAB  0   Ectopic  0   Multiple  0   Live Births  2              Patient Active Problem List   Diagnosis Date Noted  . 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 Trochanteric bursitis of right hip 07/06/2017  . Recurrent breast cancer, right ([place]) 03/07/2017  . Monoallelic mutation of NBN gene   . Genetic testing 01/13/2015  . Family history of breast cancer   . Family history of colon cancer   . Breast cancer of lower-inner quadrant of right female breast ([place]) 12/30/2014    Past Medical History:  Diagnosis Date  . Breast cancer ([place]) 11/2014  . Family history of breast cancer   . Family history of colon cancer   . History of kidney stones   . History of radiation therapy 08/11/15- 09/23/2015   Right Breast  . Hyperlipidemia   . Hypertension   . Monoallelic mutation of NBN gene   . Osteoporosis   . Skin cancer of nose     Past Surgical History:  Procedure Laterality Date  . APPENDECTOMY    . BREAST LUMPECTOMY WITH RADIOACTIVE SEED AND SENTINEL LYMPH NODE BIOPSY Right 02/03/2015   Procedure: RIGHT BREAST LUMPECTOMY WITH RADIOACTIVE SEED AND RIGHT SENTINEL  LYMPH NODE BIOPSY;  Surgeon: [name], MD;  Location: [place];  Service: General;  Laterality: Right;  . BREAST SURGERY  11/2012   biopsy, benign breast tissue  . COLONOSCOPY  06/2010   rec  . LITHOTRIPSY     kidney stone on left side  . MASTECTOMY W/ SENTINEL NODE BIOPSY Right 03/07/2017   Procedure: RIGHT TOTAL MASTECTOMY WITH RIGHT AXILLARY SENTINEL LYMPH NODE BIOPSY;  Surgeon: [name], MD;  Location: [place];  Service: General;  Laterality: Right;  . OVARIAN CYST REMOVAL  age 89  . POLYPECTOMY  04/2005   colon  . PORTACATH PLACEMENT Left 02/03/2015   Procedure: INSERTION PORT-A-CATH WITH ULTRA SOUND GUIDANCE;  Surgeon: [name], MD;  Location: [place];  Service: General;  Laterality: Left;  . reexcision  10/09/2017   Dr. Lucia Gaskins, chest wall excision for recurrent breast cancer.     Current Outpatient Medications  Medication Sig Dispense Refill  . Biotin 5000 MCG CAPS Take by mouth.    . calcium-vitamin D (OSCAL WITH D) 250-125 MG-UNIT tablet Take 1 tablet by mouth daily. Patient takes calcium 1200mg  plus vitamin D3 1,000 IU    . cholecalciferol (VITAMIN D)  1000 units tablet Take 2,000 Units by mouth daily.    . CRESTOR 20 MG tablet Take 1 tablet by mouth daily.  0  . hydrochlorothiazide (MICROZIDE) 12.5 MG capsule Take 12.5 mg by mouth daily.     Marland Kitchen losartan (COZAAR) 100 MG tablet Take 100 mg by mouth daily.  0  . OXYQUINOLONE SULFATE VAGINAL (TRIMO-SAN) 0.025 % GEL Place 1 application vaginally once a week.     No current facility-administered medications for this visit.      ALLERGIES: Patient has no known allergies.  Family History  Problem Relation Age of Onset  . Colon cancer Sister 72       died at 9  . Breast cancer Mother 61  . Hypertension Mother   . Osteoporosis Mother   . Heart disease Father        CABG  . Dementia Father   . Throat cancer Brother 2  . Breast cancer Sister 50  . Lymphoma Sister 62  . Melanoma  Sister   . Skin cancer Brother   . Skin cancer Daughter     Social History   Socioeconomic History  . Marital status: Widowed    Spouse name: Not on file  . Number of children: 2  . Years of education: Not on file  . Highest education level: Not on file  Occupational History  . Not on file  Social Needs  . Financial resource strain: Not on file  . Food insecurity:    Worry: Not on file    Inability: Not on file  . Transportation needs:    Medical: No    Non-medical: No  Tobacco Use  . Smoking status: Never Smoker  . Smokeless tobacco: Never Used  Substance and Sexual Activity  . Alcohol use: Yes  . Drug use: No  . Sexual activity: Not Currently    Partners: Male    Birth control/protection: Post-menopausal  Lifestyle  . Physical activity:    Days per week: Not on file    Minutes per session: Not on file  . Stress: Not on file  Relationships  . Social connections:    Talks on phone: Not on file    Gets together: Not on file    Attends religious service: Not on file    Active member of club or organization: Not on file    Attends meetings of clubs or organizations: Not on file    Relationship status: Not on file  . Intimate partner violence:    Fear of current or ex partner: No    Emotionally abused: No    Physically abused: No    Forced sexual activity: No  Other Topics Concern  . Not on file  Social History Narrative  . Not on file    Review of Systems  Constitutional: Negative.   HENT: Negative.   Eyes: Negative.   Respiratory: Negative.   Cardiovascular: Negative.   Gastrointestinal: Negative.   Genitourinary: Positive for dysuria, frequency and urgency.       Nocturia  Musculoskeletal: Negative.   Skin: Negative.   Neurological: Negative.   Endo/Heme/Allergies: Negative.   Psychiatric/Behavioral: Negative.     PHYSICAL EXAMINATION:    BP 140/80 (BP Location: Left Arm, Patient Position: Sitting, Cuff Size: Normal)   Pulse 84   Wt 142 lb  (64.4 kg)   LMP 01/17/1982 (Approximate)   BMI 21.28 kg/m     General appearance: alert, cooperative and appears stated age Abdomen: soft, some epigastric tenderness, no  rebound, no guarding (denies upper abdominal pain), no masses CVA: not tender  Urine dip: +blood and leuk  ASSESSMENT Cystitis    PLAN Send urine for ua, c&s Bactrim and pyridium   An After Visit Summary was printed and given to the patient.

## 2018-01-04 NOTE — Patient Instructions (Signed)

## 2018-01-05 LAB — URINE CULTURE

## 2018-01-05 LAB — URINALYSIS, MICROSCOPIC ONLY
Casts: NONE SEEN /lpf
Epithelial Cells (non renal): >10 /hpf — AB (ref 0–10)
WBC, UA: >30 /hpf — AB (ref 0–5)

## 2018-01-08 ENCOUNTER — Telehealth: Payer: Self-pay

## 2018-01-08 NOTE — Telephone Encounter (Signed)
-----   Message from Salvadore Dom, MD sent at 01/08/2018 12:51 PM EST ----- Please let the patient know that she didn't have a UTI. Her specimen was contaminated, but there was blood in her urine.  Please have her return for a repeat ccua, if the dip is + for blood then send for a micro ua. See if her bladder symptoms have improved?

## 2018-01-08 NOTE — Telephone Encounter (Signed)
Spoke with patient. Results given. Patient verbalizes understanding. Patient has the flu and would like to return next week for recheck. Nurse visit scheduled for 01/16/2018 at 10:30 am. Patient is agreeable to date and time. States that she is no longer having urinary symptoms.  Routing to provider and will close encounter.

## 2018-01-16 ENCOUNTER — Other Ambulatory Visit: Payer: Self-pay

## 2018-01-16 ENCOUNTER — Ambulatory Visit (INDEPENDENT_AMBULATORY_CARE_PROVIDER_SITE_OTHER): Payer: Medicare Other | Admitting: *Deleted

## 2018-01-16 VITALS — BP 120/86 | HR 90 | Resp 16 | Ht 68.5 in | Wt 137.0 lb

## 2018-01-16 DIAGNOSIS — R8290 Unspecified abnormal findings in urine: Secondary | ICD-10-CM

## 2018-01-16 LAB — POCT URINALYSIS DIPSTICK
BILIRUBIN UA: NEGATIVE
Blood, UA: NEGATIVE
Glucose, UA: NEGATIVE
KETONES UA: NEGATIVE
Nitrite, UA: NEGATIVE
Protein, UA: NEGATIVE
UROBILINOGEN UA: 0.2 U/dL
pH, UA: 5 (ref 5.0–8.0)

## 2018-01-16 NOTE — Progress Notes (Addendum)
Patient here for repeat ccua. Patient denies urinary symptoms. Completed bactrim as prescribed 01-04-18. Clean catch urine provided and dip showed no blood, only moderate WBC's. Reviewed with Dr. Talbert Nan, urine not sent since patient is not having symptoms.   Routing to provider and will close encounter.

## 2018-01-25 ENCOUNTER — Encounter: Payer: Self-pay | Admitting: Radiation Oncology

## 2018-01-26 NOTE — Progress Notes (Signed)
Ms. Koci presents for follow up of radiation completed 12/29/17 to her Right Chest wall, right supraclavicular and PAB nodes. She will see Dr. Lindi Adie next on 07/19/18. She has recovered from a virus that she had at the end of her radiation. She does continue to report slight shortness of breath while going up a flight of stairs. The skin to her right chest has healed. She has completed the radiaplex lotion she was given and will begin using a vitamin E containing lotion or oil.   BP 101/70 (BP Location: Left Arm, Patient Position: Sitting)   Pulse 80   Temp 98.5 F (36.9 C) (Oral)   Resp 20   Ht 5' 8.5" (1.74 m)   Wt 137 lb 9.6 oz (62.4 kg)   LMP 01/17/1982 (Approximate)   SpO2 100%   BMI 20.62 kg/m    Wt Readings from Last 3 Encounters:  01/30/18 137 lb 9.6 oz (62.4 kg)  01/16/18 137 lb (62.1 kg)  01/04/18 142 lb (64.4 kg)

## 2018-01-30 ENCOUNTER — Ambulatory Visit
Admission: RE | Admit: 2018-01-30 | Discharge: 2018-01-30 | Disposition: A | Discharge status: Home / Self Care | Payer: Medicare Other | Source: Ambulatory Visit | Attending: Radiation Oncology | Admitting: Radiation Oncology

## 2018-01-30 ENCOUNTER — Encounter: Payer: Self-pay | Admitting: Radiation Oncology

## 2018-01-30 ENCOUNTER — Other Ambulatory Visit: Payer: Self-pay

## 2018-01-30 VITALS — BP 101/70 | HR 80 | Temp 98.5°F | Resp 20 | Ht 68.5 in | Wt 137.6 lb

## 2018-01-30 DIAGNOSIS — C50911 Malignant neoplasm of unspecified site of right female breast: Secondary | ICD-10-CM | POA: Diagnosis not present

## 2018-01-30 DIAGNOSIS — C50311 Malignant neoplasm of lower-inner quadrant of right female breast: Secondary | ICD-10-CM

## 2018-01-30 DIAGNOSIS — Z923 Personal history of irradiation: Secondary | ICD-10-CM | POA: Diagnosis not present

## 2018-01-30 DIAGNOSIS — R0602 Shortness of breath: Secondary | ICD-10-CM | POA: Insufficient documentation

## 2018-01-30 DIAGNOSIS — Z79899 Other long term (current) drug therapy: Secondary | ICD-10-CM | POA: Insufficient documentation

## 2018-01-30 NOTE — Progress Notes (Signed)
Radiation Oncology         [phone] ________________________________  Name: Shawna [name] MRN: [id number]  Date: 01/30/2018  DOB: [birth year]  Follow-Up Visit Note  Outpatient  CC: [name], MD  [name], MD  Diagnosis and Prior Radiotherapy:    ICD-10-CM   1. Recurrent breast cancer, right [facility]) C50.911     11/13/2017 - 12/29/2017: Right Chest Wall and Supraclavicular, PAB nodes, and boost / 50.4 Gy +10 Gy in 28+5 fractions   08/11/2015 - 09/23/2015: Right Breast with boost / 50 Gy + 5 Gy in 25+5 fractions  CHIEF COMPLAINT: Here for follow-up and surveillance of right breast cancer  Narrative:  The patient returns today for routine follow-up. She is scheduled to see Dr. Lindi Adie on 07/19/2018.   She reports recently recovering from a virus. She states she had congestion and productive cough. She continues to have shortness of breath related to that.                               ALLERGIES:  has No Known Allergies.  Meds: Current Outpatient Medications  Medication Sig Dispense Refill  . Biotin 5000 MCG CAPS Take by mouth.    . calcium-vitamin D (OSCAL WITH D) 250-125 MG-UNIT tablet Take 1 tablet by mouth daily. Patient takes calcium 1200mg  plus vitamin D3 1,000 IU    . cholecalciferol (VITAMIN D) 1000 units tablet Take 2,000 Units by mouth daily.    . CRESTOR 20 MG tablet Take 1 tablet by mouth daily.  0  . hydrochlorothiazide (MICROZIDE) 12.5 MG capsule Take 12.5 mg by mouth daily.     Marland Kitchen losartan (COZAAR) 100 MG tablet Take 100 mg by mouth daily.  0  . Multiple Vitamins-Minerals (EMERGEN-C IMMUNE PO) Take by mouth.    Levin Erp SULFATE VAGINAL (TRIMO-SAN) 0.025 % GEL Place 1 application vaginally once a week.     No current facility-administered medications for this encounter.     Physical Findings: The patient is in no acute distress. Patient is alert and oriented.  height is 5' 8.5" (1.74 m) and weight is 137 lb 9.6 oz (62.4 kg). Her oral temperature is  98.5 F (36.9 C). Her blood pressure is 101/70 and her pulse is 80. Her respiration is 20 and oxygen saturation is 100%.    Lungs: Clear to auscultation bilaterally. Right chest skin:Residual hyperpigmentation and a little bit of residual dryness.   Lab Findings: Lab Results  Component Value Date   WBC 3.6 (L) 11/29/2017   HGB 13.0 11/29/2017   HCT 37.9 11/29/2017   MCV 96.4 11/29/2017   PLT 186 11/29/2017    Radiographic Findings: No results found.  Impression/Plan: Healing well from radiotherapy to the breast tissue.  Continue skin care with topical Vitamin E Oil and / or lotion for at least 2 more months for further healing.  I encouraged her to continue with yearly mammography as appropriate (for intact breast tissue) and followup with medical oncology. I will see her back on an as-needed basis. I have encouraged her to call if she has any issues or concerns in the future. I wished her the very best.  For her mild SOB and chest congestion which are improving, I encouraged her to reach out to either Dr. Lindi Adie or her PCP if her symptoms don't improve or worsen. A chest x-ray could be ordered to rule out anything abnormal but it is not currently warranted.  I spent 15 minutes face to face with the patient and more than 50% of that time was spent in counseling and/or coordination of care. _____________________________________   Eppie Gibson, MD  This document serves as a record of services personally performed by Eppie Gibson, MD. It was created on her behalf by Wilburn Mylar, a trained medical scribe. The creation of this record is based on the scribe's personal observations and the provider's statements to them. This document has been checked and approved by the attending provider.

## 2018-02-05 DIAGNOSIS — D2261 Melanocytic nevi of right upper limb, including shoulder: Secondary | ICD-10-CM | POA: Diagnosis not present

## 2018-02-05 DIAGNOSIS — D225 Melanocytic nevi of trunk: Secondary | ICD-10-CM | POA: Diagnosis not present

## 2018-02-05 DIAGNOSIS — L821 Other seborrheic keratosis: Secondary | ICD-10-CM | POA: Diagnosis not present

## 2018-02-05 DIAGNOSIS — Z85828 Personal history of other malignant neoplasm of skin: Secondary | ICD-10-CM | POA: Diagnosis not present

## 2018-02-06 DIAGNOSIS — R05 Cough: Secondary | ICD-10-CM | POA: Diagnosis not present

## 2018-02-06 DIAGNOSIS — J181 Lobar pneumonia, unspecified organism: Secondary | ICD-10-CM | POA: Diagnosis not present

## 2018-02-06 DIAGNOSIS — I10 Essential (primary) hypertension: Secondary | ICD-10-CM | POA: Diagnosis not present

## 2018-02-20 ENCOUNTER — Encounter: Payer: Self-pay | Admitting: Obstetrics and Gynecology

## 2018-02-20 DIAGNOSIS — R922 Inconclusive mammogram: Secondary | ICD-10-CM | POA: Diagnosis not present

## 2018-02-20 DIAGNOSIS — Z9012 Acquired absence of left breast and nipple: Secondary | ICD-10-CM | POA: Diagnosis not present

## 2018-02-27 DIAGNOSIS — J181 Lobar pneumonia, unspecified organism: Secondary | ICD-10-CM | POA: Diagnosis not present

## 2018-02-27 DIAGNOSIS — J984 Other disorders of lung: Secondary | ICD-10-CM | POA: Diagnosis not present

## 2018-02-27 DIAGNOSIS — I10 Essential (primary) hypertension: Secondary | ICD-10-CM | POA: Diagnosis not present

## 2018-02-27 DIAGNOSIS — C50311 Malignant neoplasm of lower-inner quadrant of right female breast: Secondary | ICD-10-CM | POA: Diagnosis not present

## 2018-02-28 ENCOUNTER — Other Ambulatory Visit: Payer: Self-pay | Admitting: Internal Medicine

## 2018-02-28 ENCOUNTER — Ambulatory Visit
Admission: RE | Admit: 2018-02-28 | Discharge: 2018-02-28 | Disposition: A | Discharge status: Home / Self Care | Payer: Medicare Other | Source: Ambulatory Visit | Attending: Internal Medicine | Admitting: Internal Medicine

## 2018-02-28 DIAGNOSIS — J180 Bronchopneumonia, unspecified organism: Secondary | ICD-10-CM | POA: Diagnosis not present

## 2018-02-28 DIAGNOSIS — R918 Other nonspecific abnormal finding of lung field: Secondary | ICD-10-CM

## 2018-02-28 MED ORDER — IOPAMIDOL (ISOVUE-300) INJECTION 61%
75.0000 mL | Freq: Once | INTRAVENOUS | Status: AC | PRN
  Administered 2018-02-28: 75 mL via INTRAVENOUS

## 2018-03-05 ENCOUNTER — Telehealth: Payer: Self-pay | Admitting: Hematology and Oncology

## 2018-03-05 NOTE — Telephone Encounter (Signed)
I called the patient with the results of the CT of the chest which showed a 5.5 cm right upper lobe consolidation consistent with radiation pneumonitis.  She is not having any fever so I do not think there is any pneumonia. She did complain of low back pain since she had the CT scan.  Instructed her to increase her fluid intake and take Tylenol.  If it is persistent she will call us again.

## 2018-03-12 NOTE — Progress Notes (Signed)
GYNECOLOGY  VISIT   HPI: 78 y.o.   Widowed White or Caucasian Not Hispanic or Latino  female   [phone] with Patient's last menstrual period was 01/17/1982 (approximate).   here for pessary recheck.   No c/o, no vaginal bleeding. No bowel or bladder c/o.   GYNECOLOGIC HISTORY: Patient's last menstrual period was 01/17/1982 (approximate). Contraception:post menapausal  Menopausal hormone therapy: none        OB History    Gravida  2   Para  2   Term  2   Preterm  0   AB  0   Living  2     SAB  0   TAB  0   Ectopic  0   Multiple  0   Live Births  2              Patient Active Problem List   Diagnosis Date Noted  . 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 Trochanteric bursitis of right hip 07/06/2017  . Recurrent breast cancer, right ([place]) 03/07/2017  . Monoallelic mutation of NBN gene   . Genetic testing 01/13/2015  . Family history of breast cancer   . Family history of colon cancer   . Breast cancer of lower-inner quadrant of right female breast ([place]) 12/30/2014    Past Medical History:  Diagnosis Date  . Breast cancer ([place]) 11/2014  . Family history of breast cancer   . Family history of colon cancer   . History of kidney stones   . History of radiation therapy 08/11/15- 09/23/2015   Right Breast  . History of radiation therapy 11/13/17- 12/29/17   Right chest wall and IM nodes 50.04 Gy in 28 fractions, Right supraclavicular and PAB nodes 50.04 Gy in 28 fractions, Right Chest wall boost/ 10 Gy in 5 fractions.   . Hyperlipidemia   . Hypertension   . Monoallelic mutation of NBN gene   . Osteoporosis   . Skin cancer of nose     Past Surgical History:  Procedure Laterality Date  . APPENDECTOMY    . BREAST LUMPECTOMY WITH RADIOACTIVE SEED AND SENTINEL LYMPH NODE BIOPSY Right 02/03/2015   Procedure: RIGHT BREAST LUMPECTOMY WITH RADIOACTIVE SEED AND RIGHT SENTINEL LYMPH NODE BIOPSY;  Surgeon: [name], MD;   Location: [place];  Service: General;  Laterality: Right;  . BREAST SURGERY  11/2012   biopsy, benign breast tissue  . COLONOSCOPY  06/2010   rec  . LITHOTRIPSY     kidney stone on left side  . MASTECTOMY W/ SENTINEL NODE BIOPSY Right 03/07/2017   Procedure: RIGHT TOTAL MASTECTOMY WITH RIGHT AXILLARY SENTINEL LYMPH NODE BIOPSY;  Surgeon: [name], MD;  Location: [place];  Service: General;  Laterality: Right;  . OVARIAN CYST REMOVAL  age 36  . POLYPECTOMY  04/2005   colon  . PORTACATH PLACEMENT Left 02/03/2015   Procedure: INSERTION PORT-A-CATH WITH ULTRA SOUND GUIDANCE;  Surgeon: [name], MD;  Location: [place];  Service: General;  Laterality: Left;  . reexcision  10/09/2017   Dr. Lucia Gaskins, chest wall excision for recurrent breast cancer.     Current Outpatient Medications  Medication Sig Dispense Refill  . Biotin 5000 MCG CAPS Take by mouth.    . calcium-vitamin D (OSCAL WITH D) 250-125 MG-UNIT tablet Take 1 tablet by mouth daily. Patient takes calcium 1200mg  plus vitamin D3 1,000 IU    . cholecalciferol (VITAMIN D) 1000 units tablet  Take 2,000 Units by mouth daily.    . CRESTOR 20 MG tablet Take 1 tablet by mouth daily.  0  . hydrochlorothiazide (MICROZIDE) 12.5 MG capsule Take 12.5 mg by mouth daily.     Marland Kitchen losartan (COZAAR) 100 MG tablet Take 100 mg by mouth daily.  0  . Multiple Vitamins-Minerals (EMERGEN-C IMMUNE PO) Take by mouth.    Levin Erp SULFATE VAGINAL (TRIMO-SAN) 0.025 % GEL Place 1 application vaginally once a week.     No current facility-administered medications for this visit.      ALLERGIES: Patient has no known allergies.  Family History  Problem Relation Age of Onset  . Colon cancer Sister 71       died at 65  . Breast cancer Mother 59  . Hypertension Mother   . Osteoporosis Mother   . Heart disease Father        CABG  . Dementia Father   . Throat cancer Brother 37  . Breast cancer Sister 38  . Lymphoma  Sister 75  . Melanoma Sister   . Skin cancer Brother   . Skin cancer Daughter     Social History   Socioeconomic History  . Marital status: Widowed    Spouse name: Not on file  . Number of children: 2  . Years of education: Not on file  . Highest education level: Not on file  Occupational History  . Not on file  Social Needs  . Financial resource strain: Not on file  . Food insecurity:    Worry: Not on file    Inability: Not on file  . Transportation needs:    Medical: No    Non-medical: No  Tobacco Use  . Smoking status: Never Smoker  . Smokeless tobacco: Never Used  Substance and Sexual Activity  . Alcohol use: Yes  . Drug use: No  . Sexual activity: Not Currently    Partners: Male    Birth control/protection: Post-menopausal  Lifestyle  . Physical activity:    Days per week: Not on file    Minutes per session: Not on file  . Stress: Not on file  Relationships  . Social connections:    Talks on phone: Not on file    Gets together: Not on file    Attends religious service: Not on file    Active member of club or organization: Not on file    Attends meetings of clubs or organizations: Not on file    Relationship status: Not on file  . Intimate partner violence:    Fear of current or ex partner: No    Emotionally abused: No    Physically abused: No    Forced sexual activity: No  Other Topics Concern  . Not on file  Social History Narrative  . Not on file    Review of Systems  All other systems reviewed and are negative.   PHYSICAL EXAMINATION:    LMP 01/17/1982 (Approximate)     General appearance: alert, cooperative and appears stated age  Pelvic: External genitalia:  no lesions              Urethra:  normal appearing urethra with no masses, tenderness or lesions              Bartholins and Skenes: normal                 Vagina: the pessary was removed and cleaned, no vaginal irritation, mild atrophy, some yellow vaginal d/c (  patient denies  symptoms)              Cervix:  no lesions  Chaperone was present for exam.  ASSESSMENT Pessary maintenance, doing well    PLAN F/U in 3 months Call with any concerns   An After Visit Summary was printed and given to the patient.

## 2018-03-13 ENCOUNTER — Encounter: Payer: Self-pay | Admitting: Obstetrics and Gynecology

## 2018-03-13 ENCOUNTER — Ambulatory Visit (INDEPENDENT_AMBULATORY_CARE_PROVIDER_SITE_OTHER): Payer: Medicare Other | Admitting: Obstetrics and Gynecology

## 2018-03-13 VITALS — BP 118/64 | HR 66 | Resp 14 | Ht 69.0 in | Wt 139.0 lb

## 2018-03-13 DIAGNOSIS — Z4689 Encounter for fitting and adjustment of other specified devices: Secondary | ICD-10-CM

## 2018-05-10 ENCOUNTER — Ambulatory Visit: Payer: Medicare Other | Admitting: Obstetrics and Gynecology

## 2018-05-22 DIAGNOSIS — N3281 Overactive bladder: Secondary | ICD-10-CM | POA: Diagnosis not present

## 2018-05-22 DIAGNOSIS — E785 Hyperlipidemia, unspecified: Secondary | ICD-10-CM | POA: Diagnosis not present

## 2018-05-22 DIAGNOSIS — M1990 Unspecified osteoarthritis, unspecified site: Secondary | ICD-10-CM | POA: Diagnosis not present

## 2018-05-22 DIAGNOSIS — J984 Other disorders of lung: Secondary | ICD-10-CM | POA: Diagnosis not present

## 2018-05-30 DIAGNOSIS — C50911 Malignant neoplasm of unspecified site of right female breast: Secondary | ICD-10-CM | POA: Diagnosis not present

## 2018-06-12 NOTE — Progress Notes (Signed)
78 y.o. G2P2002 Widowed White or Caucasian Not Hispanic or Latino female here for annual exam.  She has genital prolapse that is well controlled with a #5 ring pessary with support. She uses trimosan cream one x a week. No vaginal bleeding. No bowel or bladder c/o.   She had pneumonia in 12/19-1/20. Treated as an outpatient.   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 She met with the plastic surgeon about reconstruction and she decided against it.    Patient's last menstrual period was 01/17/1982 (approximate).          Sexually active: No.  The current method of family planning is post menopausal status.    Exercising: Yes.    walking Smoker:  no  Health Maintenance: Pap:  02-24-15 WNL  History of abnormal Pap:  no MMG:  02/18/2018 Birads 1 negative  Colonoscopy:  08-12-16 polyps, was not told she needed it repeated.   BMD:   2018 Osteoporosis, she was on reclast, stopped it two years with her primary. Was supposed to have her DEXA early this month, had to cancel secondary to covid.  Will f/u with primary TDaP:  10-13-11 Gardasil: N/A   reports that she has never smoked. She has never used smokeless tobacco. She reports current alcohol use. She reports that she does not use drugs. ~1 drink a day. Son and Daughter in [place]. She has 4 step-grandchildren   Past Medical History:  Diagnosis Date  . Breast cancer (HCC) 11/2014  . Family history of breast cancer   . Family history of colon cancer   . History of kidney stones   . History of radiation therapy 08/11/15-  09/23/2015   Right Breast  . History of radiation therapy 11/13/17- 12/29/17   Right chest wall and IM nodes 50.04 Gy in 28 fractions, Right supraclavicular and PAB nodes 50.04 Gy in 28 fractions, Right Chest wall boost/ 10 Gy in 5 fractions.   . Hyperlipidemia   . Hypertension   . Monoallelic mutation of NBN gene   . Osteoporosis   . Skin cancer of nose     Past Surgical History:  Procedure Laterality Date  . APPENDECTOMY    . BREAST LUMPECTOMY WITH RADIOACTIVE SEED AND SENTINEL LYMPH NODE BIOPSY Right 02/03/2015   Procedure: RIGHT BREAST LUMPECTOMY WITH RADIOACTIVE SEED AND RIGHT SENTINEL LYMPH NODE BIOPSY;  Surgeon: David Newman, MD;  Location: [facility];  Service: General;  Laterality: Right;  . BREAST SURGERY  11/2012   biopsy, benign breast tissue  . COLONOSCOPY  06/2010   rec  . LITHOTRIPSY     kidney stone on left side  . MASTECTOMY W/ SENTINEL NODE BIOPSY Right 03/07/2017   Procedure: RIGHT TOTAL MASTECTOMY WITH RIGHT AXILLARY SENTINEL LYMPH NODE BIOPSY;  Surgeon: Newman, David, MD;  Location: [facility];  Service: General;  Laterality: Right;  . OVARIAN CYST REMOVAL  age 24  . POLYPECTOMY  04/2005   colon  . PORTACATH PLACEMENT Left 02/03/2015   Procedure: INSERTION PORT-A-CATH WITH ULTRA SOUND GUIDANCE;  Surgeon: David Newman, MD;  Location: [facility];  Service: General;    Laterality: Left;  . reexcision  10/09/2017   Dr. Newman, chest wall excision for recurrent breast cancer.     Current Outpatient Medications  Medication Sig Dispense Refill  . Biotin 5000 MCG CAPS Take by mouth.    . calcium-vitamin D (OSCAL WITH D) 250-125 MG-UNIT tablet Take 1 tablet by mouth daily. Patient takes calcium 1200mg plus vitamin D3 1,000 IU    . cholecalciferol (VITAMIN D) 1000 units tablet Take 2,000 Units by mouth daily.    . CRESTOR 20 MG tablet Take 1 tablet by mouth daily.  0  . hydrochlorothiazide (MICROZIDE) 12.5 MG capsule Take 12.5 mg by mouth daily.      . losartan (COZAAR) 100 MG tablet Take 100 mg by mouth daily.  0  . Multiple Vitamins-Minerals (EMERGEN-C IMMUNE PO) Take by mouth.    . OXYQUINOLONE SULFATE VAGINAL (TRIMO-SAN) 0.025 % GEL Place 1 application vaginally once a week.     No current facility-administered medications for this visit.     Family History  Problem Relation Age of Onset  . Colon cancer Sister 41       died at 45  . Breast cancer Mother 76  . Hypertension Mother   . Osteoporosis Mother   . Heart disease Father        CABG  . Dementia Father   . Throat cancer Brother 64  . Breast cancer Sister 48  . Lymphoma Sister 40  . Melanoma Sister   . Skin cancer Brother   . Skin cancer Daughter     Review of Systems  Constitutional: Negative.   HENT: Negative.   Eyes: Negative.   Respiratory: Negative.   Cardiovascular: Negative.   Gastrointestinal: Negative.   Endocrine: Negative.   Genitourinary: Negative.   Musculoskeletal: Negative.   Skin: Negative.   Allergic/Immunologic: Negative.   Neurological: Negative.   Hematological: Negative.   Psychiatric/Behavioral: Negative.     Exam:   BP 118/78 (BP Location: Left Arm, Patient Position: Sitting, Cuff Size: Normal)   Pulse 72   Temp 98 F (36.7 C) (Skin)   Ht 5' 8.5" (1.74 m)   Wt 144 lb (65.3 kg)   LMP 01/17/1982 (Approximate)   BMI 21.58 kg/m   Weight change: @WEIGHTCHANGE@ Height:   Height: 5' 8.5" (174 cm)  Ht Readings from Last 3 Encounters:  06/13/18 5' 8.5" (1.74 m)  03/13/18 5' 9" (1.753 m)  01/30/18 5' 8.5" (1.74 m)    General appearance: alert, cooperative and appears stated age Head: Normocephalic, without obvious abnormality, atraumatic Neck: no adenopathy, supple, symmetrical, trachea midline and thyroid normal to inspection and palpation Lungs: clear to auscultation bilaterally Cardiovascular: regular rate and rhythm Breasts: normal appearance, no masses or tenderness, right mastectomy, no chest wall masses Abdomen: soft,  non-tender; non distended,  no masses,  no organomegaly Extremities: extremities normal, atraumatic, no cyanosis or edema Skin: Skin color, texture, turgor normal. No rashes or lesions Lymph nodes: Cervical, supraclavicular, and axillary nodes normal. No abnormal inguinal nodes palpated Neurologic: Grossly normal   Pelvic: External genitalia:  no lesions              Urethra:  normal appearing urethra with no masses, tenderness or lesions              Bartholins and Skenes: normal                 Vagina: the pessary was removed and cleaned, mild atrophy, no erosions, no granulation tissue or irritation. Pessary   replaced.              Cervix: no lesions               Bimanual Exam:  Uterus:  normal size, contour, position, consistency, mobility, non-tender              Adnexa: no mass, fullness, tenderness               Rectovaginal: Confirms               Anus:  normal sphincter tone, no lesions  Chaperone was present for exam.  A:  Well Woman with normal exam  Genital prolapse controlled with the pessary  P:   No pap needed  Discussed breast self exam  Discussed calcium and vit D intake  Mammogram UTD  DEXA with primary  Colonoscopy UTD  F/U pessary check in 3 months.

## 2018-06-13 ENCOUNTER — Encounter: Payer: Self-pay | Admitting: Obstetrics and Gynecology

## 2018-06-13 ENCOUNTER — Other Ambulatory Visit: Payer: Self-pay

## 2018-06-13 ENCOUNTER — Ambulatory Visit (INDEPENDENT_AMBULATORY_CARE_PROVIDER_SITE_OTHER): Payer: Medicare Other | Admitting: Obstetrics and Gynecology

## 2018-06-13 VITALS — BP 118/78 | HR 72 | Temp 98.0°F | Ht 68.5 in | Wt 144.0 lb

## 2018-06-13 DIAGNOSIS — Z01419 Encounter for gynecological examination (general) (routine) without abnormal findings: Secondary | ICD-10-CM | POA: Diagnosis not present

## 2018-06-13 DIAGNOSIS — Z4689 Encounter for fitting and adjustment of other specified devices: Secondary | ICD-10-CM | POA: Diagnosis not present

## 2018-06-13 DIAGNOSIS — Z853 Personal history of malignant neoplasm of breast: Secondary | ICD-10-CM

## 2018-06-13 NOTE — Patient Instructions (Signed)
EXERCISE AND DIET:  We recommended that you start or continue a regular exercise program for good health. Regular exercise means any activity that makes your heart beat faster and makes you sweat.  We recommend exercising at least 30 minutes per day at least 3 days a week, preferably 4 or 5.  We also recommend a diet low in fat and sugar.  Inactivity, poor dietary choices and obesity can cause diabetes, heart attack, stroke, and kidney damage, among others.    ALCOHOL AND SMOKING:  Women should limit their alcohol intake to no more than 7 drinks/beers/glasses of wine (combined, not each!) per week. Moderation of alcohol intake to this level decreases your risk of breast cancer and liver damage. And of course, no recreational drugs are part of a healthy lifestyle.  And absolutely no smoking or even second hand smoke. Most people know smoking can cause heart and lung diseases, but did you know it also contributes to weakening of your bones? Aging of your skin?  Yellowing of your teeth and nails?  CALCIUM AND VITAMIN D:  Adequate intake of calcium and Vitamin D are recommended.  The recommendations for exact amounts of these supplements seem to change often, but generally speaking 1,200 mg of calcium (between diet and supplement) and 800 units of Vitamin D per day seems prudent. Certain women may benefit from higher intake of Vitamin D.  If you are among these women, your doctor will have told you during your visit.    PAP SMEARS:  Pap smears, to check for cervical cancer or precancers,  have traditionally been done yearly, although recent scientific advances have shown that most women can have pap smears less often.  However, every woman still should have a physical exam from her gynecologist every year. It will include a breast check, inspection of the vulva and vagina to check for abnormal growths or skin changes, a visual exam of the cervix, and then an exam to evaluate the size and shape of the uterus and  ovaries.  And after 76 years of age, a rectal exam is indicated to check for rectal cancers. We will also provide age appropriate advice regarding health maintenance, like when you should have certain vaccines, screening for sexually transmitted diseases, bone density testing, colonoscopy, mammograms, etc.   MAMMOGRAMS:  All women over 40 years old should have a yearly mammogram. Many facilities now offer a "3D" mammogram, which may cost around $50 extra out of pocket. If possible,  we recommend you accept the option to have the 3D mammogram performed.  It both reduces the number of women who will be called back for extra views which then turn out to be normal, and it is better than the routine mammogram at detecting truly abnormal areas.    COLON CANCER SCREENING: Now recommend starting at age 45. At this time colonoscopy is not covered for routine screening until 50. There are take home tests that can be done between 45-49.   COLONOSCOPY:  Colonoscopy to screen for colon cancer is recommended for all women at age 50.  We know, you hate the idea of the prep.  We agree, BUT, having colon cancer and not knowing it is worse!!  Colon cancer so often starts as a polyp that can be seen and removed at colonscopy, which can quite literally save your life!  And if your first colonoscopy is normal and you have no family history of colon cancer, most women don't have to have it again for   10 years.  Once every ten years, you can do something that may end up saving your life, right?  We will be happy to help you get it scheduled when you are ready.  Be sure to check your insurance coverage so you understand how much it will cost.  It may be covered as a preventative service at no cost, but you should check your particular policy.      Breast Self-Awareness Breast self-awareness means being familiar with how your breasts look and feel. It involves checking your breasts regularly and reporting any changes to your  health care provider. Practicing breast self-awareness is important. A change in your breasts can be a sign of a serious medical problem. Being familiar with how your breasts look and feel allows you to find any problems early, when treatment is more likely to be successful. All women should practice breast self-awareness, including women who have had breast implants. How to do a breast self-exam One way to learn what is normal for your breasts and whether your breasts are changing is to do a breast self-exam. To do a breast self-exam: Look for Changes  1. Remove all the clothing above your waist. 2. Stand in front of a mirror in a room with good lighting. 3. Put your hands on your hips. 4. Push your hands firmly downward. 5. Compare your breasts in the mirror. Look for differences between them (asymmetry), such as: ? Differences in shape. ? Differences in size. ? Puckers, dips, and bumps in one breast and not the other. 6. Look at each breast for changes in your skin, such as: ? Redness. ? Scaly areas. 7. Look for changes in your nipples, such as: ? Discharge. ? Bleeding. ? Dimpling. ? Redness. ? A change in position. Feel for Changes Carefully feel your breasts for lumps and changes. It is best to do this while lying on your back on the floor and again while sitting or standing in the shower or tub with soapy water on your skin. Feel each breast in the following way:  Place the arm on the side of the breast you are examining above your head.  Feel your breast with the other hand.  Start in the nipple area and make  inch (2 cm) overlapping circles to feel your breast. Use the pads of your three middle fingers to do this. Apply light pressure, then medium pressure, then firm pressure. The light pressure will allow you to feel the tissue closest to the skin. The medium pressure will allow you to feel the tissue that is a little deeper. The firm pressure will allow you to feel the tissue  close to the ribs.  Continue the overlapping circles, moving downward over the breast until you feel your ribs below your breast.  Move one finger-width toward the center of the body. Continue to use the  inch (2 cm) overlapping circles to feel your breast as you move slowly up toward your collarbone.  Continue the up and down exam using all three pressures until you reach your armpit.  Write Down What You Find  Write down what is normal for each breast and any changes that you find. Keep a written record with breast changes or normal findings for each breast. By writing this information down, you do not need to depend only on memory for size, tenderness, or location. Write down where you are in your menstrual cycle, if you are still menstruating. If you are having trouble noticing differences   in your breasts, do not get discouraged. With time you will become more familiar with the variations in your breasts and more comfortable with the exam. How often should I examine my breasts? Examine your breasts every month. If you are breastfeeding, the best time to examine your breasts is after a feeding or after using a breast pump. If you menstruate, the best time to examine your breasts is 5-7 days after your period is over. During your period, your breasts are lumpier, and it may be more difficult to notice changes. When should I see my health care provider? See your health care provider if you notice:  A change in shape or size of your breasts or nipples.  A change in the skin of your breast or nipples, such as a reddened or scaly area.  Unusual discharge from your nipples.  A lump or thick area that was not there before.  Pain in your breasts.  Anything that concerns you.  

## 2018-06-28 DIAGNOSIS — H5203 Hypermetropia, bilateral: Secondary | ICD-10-CM | POA: Diagnosis not present

## 2018-07-02 DIAGNOSIS — J984 Other disorders of lung: Secondary | ICD-10-CM | POA: Diagnosis not present

## 2018-07-23 ENCOUNTER — Telehealth: Payer: Self-pay | Admitting: Hematology and Oncology

## 2018-07-23 NOTE — Telephone Encounter (Signed)
I left a message regarding video visit  °

## 2018-07-23 NOTE — Assessment & Plan Note (Signed)
Right lumpectomy 02/03/2015: Invasive ductal carcinoma grade 2, 2.1 cm, with associated DCIS, margins are negative, 0/1 lymph node, ER 0%, PR 0%, HER-2 positiveratio 2.31, T2 N0 stage II a. (Right breast biopsy 3:30 position 12/09/2014: Invasive ductal carcinoma grade 3, ER 0%, PR 0%, HER-2 negativeratio 1.5, Ki-67 90%, 1 cm irregular mass T1C N0 stage IA clinical stage) Genetic counseling revealed NBN mutation  2. Adjuvant chemotherapy with [place] 6 cycles started 03/09/15 completed 06/22/15 tookHerceptin maintenance for 1 year until 02/15/2016 3. Followed by radiation therapy 08/11/2015 to09/05/2015 4. Cutaneous recurrence: 03/07/2017: Right mastectomy: IDC grade 3, 2 foci spanning 1.2 cm and 1.1 cm, lymphovascular invasion is present including dermal lymphatics, margins negative, 0/2 lymph nodes negative, ER 0%, PR 0%, HER-2 negative, Ki-67 not done,pT4b (skin involvement) N0 stage IIIc PET/CT 02/16/2017: No metastatic disease ----------------------------------------------------------------------------------------------- Patient decided not to receive any further systemic chemotherapy. There is no role of antiestrogen therapy because she is triple negative.  Chest wall Recurrence:  10/10/17: Chest wall excision: Breast cancer, ER 0%, PR 0%, HER-2 negative Adjuvant radiation therapy 11/14/2017-12/29/2017 Radiation pneumonitis on CT chest 03/05/2018  Breast cancer surveillance 02/20/2018: Left breast mammogram: Benign, breast density category C Return to clinic in 1 year for follow-up

## 2018-07-25 NOTE — Progress Notes (Signed)
HEMATOLOGY-ONCOLOGY MYCHART VIDEO VISIT PROGRESS NOTE  I connected with Shawna [name] on 07/26/2018 at  2:00 PM EDT by MyChart video conference and verified that I am speaking with the correct person using two identifiers.  I discussed the limitations, risks, security and privacy concerns of performing an evaluation and management service by MyChart and the availability of in person appointments.  I also discussed with the patient that there may be a patient responsible charge related to this service. The patient expressed understanding and agreed to proceed.  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 PET scan on 10/24/17 showed no evidence of distant metastatic disease and activity at the site of the surgery in the left axilla recommended for surveillance. Mammogram on 02/20/18 showed no evidence of malignancy in the left breast. She presents over MyChart today for follow-up.   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 Genes tested include:  ATM, BARD1, BRCA1, BRCA2, BRIP1, CDH1, CHEK2, EPCAM, FANCC, MLH1, MSH2, MSH6, NBN, PALB2, PMS2, PTEN, RAD51C, RAD51D, TP53, and XRCC2.   02/03/2015 Surgery   Right lumpectomy: Invasive ductal carcinoma grade 2, 2.1 cm, with associated DCIS, margins are  negative, 0/1 lymph node, ER 0%, PR 0%, HER-2 positive ratio 2.31, T2 N0 stage II a   03/09/2015 - 06/22/2015 Chemotherapy   Adjuvant chemotherapy with [place] 6 followed by Herceptin maintenance for 1 year   08/11/2015 - 09/23/2015 Radiation Therapy   Adjuvant radiation therapy [name]):  1) Right Breast / 50 Gy in 25 fractions ; 2) Right Breast Boost / 10 Gy in 5 fractions   01/30/2017 Relapse/Recurrence   Skin biopsy right breast: Metastatic breast cancer GCDFP strongly positive   02/15/2017 PET scan   Hypermetabolic focus of skin thickening along the medial right breast/chest wall consistent with recurrent disease, no other hypermetabolic metastases identified.   03/07/2017 Surgery   Right mastectomy: IDC grade 3, 2 foci spanning 1.2 cm and 1.1 cm, lymphovascular invasion is present including dermal lymphatics, margins negative, 0/2 lymph nodes negative, ER 0%, PR 0%, HER-2 negative, Ki-67 not done,pT4b (skin involvement) N0 stage IIIc   10/12/2017 Surgery   Chest wall Excision: Breast cancer   11/14/2017 - 12/29/2017 Radiation Therapy   Adjuvant right chest wall XRT     REVIEW OF SYSTEMS:   Constitutional: Denies fevers, chills or abnormal weight loss Eyes: Denies blurriness of vision Ears, nose, mouth, throat, and face: Denies mucositis or sore throat Respiratory: Denies cough, dyspnea or wheezes Cardiovascular: Denies palpitation, chest discomfort Gastrointestinal:  Denies nausea, heartburn or change in bowel habits Skin: Denies abnormal skin rashes Lymphatics: Denies new lymphadenopathy or easy bruising Neurological:Denies numbness, tingling or new weaknesses Behavioral/Psych: Mood is stable, no new changes  Extremities: No lower extremity edema Breast: denies any pain or lumps or nodules in either breasts All other systems were reviewed with the patient and  are negative.  Observations/Objective:  There were no vitals filed for this visit. There is no height or weight on file to  calculate BMI.  I have reviewed the data as listed CMP Latest Ref Rng & Units 11/29/2017 03/07/2017 01/25/2016  Glucose 70 - 99 mg/dL 139(H) 82 79  BUN 8 - 23 mg/dL 22 26(H) 30.7(H)  Creatinine 0.44 - 1.00 mg/dL 1.00 0.94 1.0  Sodium 135 - 145 mmol/L 141 140 140  Potassium 3.5 - 5.1 mmol/L 3.3(L) 3.4(L) 3.8  Chloride 98 - 111 mmol/L 106 107 -  CO2 22 - 32 mmol/L _0 Calcium 8.9 - 10.3 mg/dL 9.7 9.2 9.8  Total Protein 6.5 - 8.1 g/dL 6.9 - 6.5  Total Bilirubin 0.3 - 1.2 mg/dL 0.7 - 0.69  Alkaline Phos 38 - 126 U/L 89 - 113  AST 15 - 41 U/L 24 - 33  ALT 0 - 44 U/L 20 - 33    Lab Results  Component Value Date   WBC 3.6 (L) 11/29/2017   HGB 13.0 11/29/2017   HCT 37.9 11/29/2017   MCV 96.4 11/29/2017   PLT 186 11/29/2017   NEUTROABS 2.8 11/29/2017      Assessment Plan:  Breast cancer of lower-inner quadrant of right female breast ([place]) Right lumpectomy 02/03/2015: Invasive ductal carcinoma grade 2, 2.1 cm, with associated DCIS, margins are negative, 0/1 lymph node, ER 0%, PR 0%, HER-2 positiveratio 2.31, T2 N0 stage II a. (Right breast biopsy 3:30 position 12/09/2014: Invasive ductal carcinoma grade 3, ER 0%, PR 0%, HER-2 negativeratio 1.5, Ki-67 90%, 1 cm irregular mass T1C N0 stage IA clinical stage) Genetic counseling revealed NBN mutation  2. Adjuvant chemotherapy with [place] 6 cycles started 03/09/15 completed 06/22/15 tookHerceptin maintenance for 1 year until 02/15/2016 3. Followed by radiation therapy 08/11/2015 to09/05/2015 4. Cutaneous recurrence: 03/07/2017: Right mastectomy: IDC grade 3, 2 foci spanning 1.2 cm and 1.1 cm, lymphovascular invasion is present including dermal lymphatics, margins negative, 0/2 lymph nodes negative, ER 0%, PR 0%, HER-2 negative, Ki-67 not done,pT4b (skin involvement) N0 stage IIIc PET/CT 02/16/2017: No metastatic disease ----------------------------------------------------------------------------------------------- Patient decided not to  receive any further systemic chemotherapy. There is no role of antiestrogen therapy because she is triple negative.  Chest wall Recurrence:  10/10/17: Chest wall excision: Breast cancer, ER 0%, PR 0%, HER-2 negative Adjuvant radiation therapy 11/14/2017-12/29/2017 Radiation pneumonitis on CT chest 03/05/2018  Breast cancer surveillance 02/20/2018: Left breast mammogram: Benign, breast density category C Return to clinic in 1 year for follow-up  I discussed the assessment and treatment plan with the patient. The patient was provided an opportunity to ask questions and all were answered. The patient agreed with the plan and demonstrated an understanding of the instructions. The patient was advised to call back or seek an in-person evaluation if the symptoms worsen or if the condition fails to improve as anticipated.   I provided 15 minutes of face-to-face MyChart video visit time during this encounter.    Rulon Eisenmenger, MD 07/26/2018   I, Shawna [name], am acting as scribe for [name], MD.  I have reviewed the above documentation for accuracy and completeness, and I agree with the above.

## 2018-07-26 ENCOUNTER — Inpatient Hospital Stay: Payer: Medicare Other | Attending: Hematology and Oncology | Admitting: Hematology and Oncology

## 2018-07-26 DIAGNOSIS — Z171 Estrogen receptor negative status [ER-]: Secondary | ICD-10-CM

## 2018-07-26 DIAGNOSIS — C50311 Malignant neoplasm of lower-inner quadrant of right female breast: Secondary | ICD-10-CM | POA: Diagnosis not present

## 2018-08-16 DIAGNOSIS — C50911 Malignant neoplasm of unspecified site of right female breast: Secondary | ICD-10-CM | POA: Diagnosis not present

## 2018-08-16 DIAGNOSIS — L989 Disorder of the skin and subcutaneous tissue, unspecified: Secondary | ICD-10-CM | POA: Diagnosis not present

## 2018-08-20 DIAGNOSIS — J984 Other disorders of lung: Secondary | ICD-10-CM | POA: Diagnosis not present

## 2018-08-27 ENCOUNTER — Other Ambulatory Visit: Payer: Self-pay | Admitting: Internal Medicine

## 2018-08-30 ENCOUNTER — Other Ambulatory Visit: Payer: Self-pay | Admitting: Internal Medicine

## 2018-08-30 DIAGNOSIS — R918 Other nonspecific abnormal finding of lung field: Secondary | ICD-10-CM

## 2018-09-10 DIAGNOSIS — Z0120 Encounter for dental examination and cleaning without abnormal findings: Secondary | ICD-10-CM | POA: Diagnosis not present

## 2018-09-11 NOTE — Progress Notes (Signed)
GYNECOLOGY  VISIT   HPI: 78 y.o.   Widowed White or Caucasian Not Hispanic or Latino  female   [phone] with Patient's last menstrual period was 01/17/1982 (approximate).   here for pessary check. She has genital prolapse that is well controlled with a #5 ring pessary with support. She uses trimosan cream 1 x a week.    She noticed one spot of red blood when she wiped ~2-3 weeks ago. Not sure where the blood came from. BM was soft.  GYNECOLOGIC HISTORY: Patient's last menstrual period was 01/17/1982 (approximate). Contraception:Postmenopausal Menopausal hormone therapy: none        OB History    Gravida  2   Para  2   Term  2   Preterm  0   AB  0   Living  2     SAB  0   TAB  0   Ectopic  0   Multiple  0   Live Births  2              Patient Active Problem List   Diagnosis Date Noted  . 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 Trochanteric bursitis of right hip 07/06/2017  . Recurrent breast cancer, right ([place]) 03/07/2017  . Monoallelic mutation of NBN gene   . Genetic testing 01/13/2015  . Family history of breast cancer   . Family history of colon cancer   . Breast cancer of lower-inner quadrant of right female breast ([place]) 12/30/2014    Past Medical History:  Diagnosis Date  . Breast cancer ([place]) 11/2014  . Family history of breast cancer   . Family history of colon cancer   . History of kidney stones   . History of radiation therapy 08/11/15- 09/23/2015   Right Breast  . History of radiation therapy 11/13/17- 12/29/17   Right chest wall and IM nodes 50.04 Gy in 28 fractions, Right supraclavicular and PAB nodes 50.04 Gy in 28 fractions, Right Chest wall boost/ 10 Gy in 5 fractions.   . Hyperlipidemia   . Hypertension   . Monoallelic mutation of NBN gene   . Osteoporosis   . Skin cancer of nose     Past Surgical History:  Procedure Laterality Date  . APPENDECTOMY    . BREAST LUMPECTOMY WITH RADIOACTIVE  SEED AND SENTINEL LYMPH NODE BIOPSY Right 02/03/2015   Procedure: RIGHT BREAST LUMPECTOMY WITH RADIOACTIVE SEED AND RIGHT SENTINEL LYMPH NODE BIOPSY;  Surgeon: [name], MD;  Location: [place];  Service: General;  Laterality: Right;  . BREAST SURGERY  11/2012   biopsy, benign breast tissue  . COLONOSCOPY  06/2010   rec  . LITHOTRIPSY     kidney stone on left side  . MASTECTOMY W/ SENTINEL NODE BIOPSY Right 03/07/2017   Procedure: RIGHT TOTAL MASTECTOMY WITH RIGHT AXILLARY SENTINEL LYMPH NODE BIOPSY;  Surgeon: [name], MD;  Location: [place];  Service: General;  Laterality: Right;  . OVARIAN CYST REMOVAL  age 40  . POLYPECTOMY  04/2005   colon  . PORTACATH PLACEMENT Left 02/03/2015   Procedure: INSERTION PORT-A-CATH WITH ULTRA SOUND GUIDANCE;  Surgeon: [name], MD;  Location: [place];  Service: General;  Laterality: Left;  . reexcision  10/09/2017   Dr. Lucia Gaskins, chest wall excision for recurrent breast cancer.     Current Outpatient Medications  Medication Sig Dispense Refill  . Biotin 5000 MCG CAPS Take by mouth.    Marland Kitchen  calcium-vitamin D (OSCAL WITH D) 250-125 MG-UNIT tablet Take 1 tablet by mouth daily. Patient takes calcium 1200mg  plus vitamin D3 1,000 IU    . cholecalciferol (VITAMIN D) 1000 units tablet Take 2,000 Units by mouth daily.    . CRESTOR 20 MG tablet Take 1 tablet by mouth daily.  0  . cyanocobalamin 100 MCG tablet Take 100 mcg by mouth daily.    . hydrochlorothiazide (MICROZIDE) 12.5 MG capsule Take 12.5 mg by mouth daily.     Marland Kitchen losartan (COZAAR) 100 MG tablet Take 100 mg by mouth daily.  0  . Multiple Vitamins-Minerals (EMERGEN-C IMMUNE PO) Take by mouth.    Levin Erp SULFATE VAGINAL (TRIMO-SAN) 0.025 % GEL Place 1 application vaginally once a week.     No current facility-administered medications for this visit.      ALLERGIES: Patient has no known allergies.  Family History  Problem Relation Age of Onset  . Colon  cancer Sister 13       died at 23  . Breast cancer Mother 32  . Hypertension Mother   . Osteoporosis Mother   . Heart disease Father        CABG  . Dementia Father   . Throat cancer Brother 25  . Breast cancer Sister 45  . Lymphoma Sister 75  . Melanoma Sister   . Skin cancer Brother   . Skin cancer Daughter     Social History   Socioeconomic History  . Marital status: Widowed    Spouse name: Not on file  . Number of children: 2  . Years of education: Not on file  . Highest education level: Not on file  Occupational History  . Not on file  Social Needs  . Financial resource strain: Not on file  . Food insecurity    Worry: Not on file    Inability: Not on file  . Transportation needs    Medical: No    Non-medical: No  Tobacco Use  . Smoking status: Never Smoker  . Smokeless tobacco: Never Used  Substance and Sexual Activity  . Alcohol use: Yes    Comment: rarely  . Drug use: No  . Sexual activity: Not Currently    Partners: Male    Birth control/protection: Post-menopausal  Lifestyle  . Physical activity    Days per week: Not on file    Minutes per session: Not on file  . Stress: Not on file  Relationships  . Social Herbalist on phone: Not on file    Gets together: Not on file    Attends religious service: Not on file    Active member of club or organization: Not on file    Attends meetings of clubs or organizations: Not on file    Relationship status: Not on file  . Intimate partner violence    Fear of current or ex partner: No    Emotionally abused: No    Physically abused: No    Forced sexual activity: No  Other Topics Concern  . Not on file  Social History Narrative  . Not on file    Review of Systems  Constitutional: Negative.   HENT: Negative.   Eyes: Negative.   Respiratory: Negative.   Cardiovascular: Negative.   Gastrointestinal: Negative.   Genitourinary: Negative.   Musculoskeletal: Negative.   Skin: Negative.    Neurological: Negative.   Endo/Heme/Allergies: Negative.   Psychiatric/Behavioral: Negative.     PHYSICAL EXAMINATION:    BP  128/72 (BP Location: Left Arm, Patient Position: Sitting, Cuff Size: Normal)   Pulse 84   Temp (!) 97.1 F (36.2 C) (Temporal)   Resp 12   Ht 5' 8.5" (1.74 m)   Wt 144 lb (65.3 kg)   LMP 01/17/1982 (Approximate)   BMI 21.58 kg/m     General appearance: alert, cooperative and appears stated age  Pelvic: External genitalia:  no lesions              Urethra:  normal appearing urethra with no masses, tenderness or lesions              Bartholins and Skenes: normal                 Vagina: the pessary was removed and cleaned. No vaginal irritation. With removal of the pessary a small laceration occurred in the posterior fourchette.               Cervix: no lesions               Chaperone was present for exam.  ASSESSMENT Pessary check, doing well PMP spotting, not sure of the source    PLAN Continue with trimosan cream weekly F/U pessary check in 3 months F/U for pelvic ultrasound and possible endometrial biopsy.   An After Visit Summary was printed and given to the patient.

## 2018-09-13 ENCOUNTER — Encounter: Payer: Self-pay | Admitting: Obstetrics and Gynecology

## 2018-09-13 ENCOUNTER — Other Ambulatory Visit: Payer: Self-pay

## 2018-09-13 ENCOUNTER — Ambulatory Visit (INDEPENDENT_AMBULATORY_CARE_PROVIDER_SITE_OTHER): Payer: Medicare Other | Admitting: Obstetrics and Gynecology

## 2018-09-13 VITALS — BP 128/72 | HR 84 | Temp 97.1°F | Resp 12 | Ht 68.5 in | Wt 144.0 lb

## 2018-09-13 DIAGNOSIS — N819 Female genital prolapse, unspecified: Secondary | ICD-10-CM

## 2018-09-13 DIAGNOSIS — Z4689 Encounter for fitting and adjustment of other specified devices: Secondary | ICD-10-CM | POA: Diagnosis not present

## 2018-09-13 DIAGNOSIS — N950 Postmenopausal bleeding: Secondary | ICD-10-CM

## 2018-09-18 NOTE — Progress Notes (Deleted)
GYNECOLOGY  VISIT   HPI: 78 y.o.   Widowed White or Caucasian Not Hispanic or Latino  female   [phone] with Patient's last menstrual period was 01/17/1982 (approximate).   here for     GYNECOLOGIC HISTORY: Patient's last menstrual period was 01/17/1982 (approximate). Contraception:*** Menopausal hormone therapy: ***        OB History    Gravida  2   Para  2   Term  2   Preterm  0   AB  0   Living  2     SAB  0   TAB  0   Ectopic  0   Multiple  0   Live Births  2              Patient Active Problem List   Diagnosis Date Noted  . 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 Trochanteric bursitis of right hip 07/06/2017  . Recurrent breast cancer, right ([place]) 03/07/2017  . Monoallelic mutation of NBN gene   . Genetic testing 01/13/2015  . Family history of breast cancer   . Family history of colon cancer   . Breast cancer of lower-inner quadrant of right female breast ([place]) 12/30/2014    Past Medical History:  Diagnosis Date  . Breast cancer ([place]) 11/2014  . Family history of breast cancer   . Family history of colon cancer   . History of kidney stones   . History of radiation therapy 08/11/15- 09/23/2015   Right Breast  . History of radiation therapy 11/13/17- 12/29/17   Right chest wall and IM nodes 50.04 Gy in 28 fractions, Right supraclavicular and PAB nodes 50.04 Gy in 28 fractions, Right Chest wall boost/ 10 Gy in 5 fractions.   . Hyperlipidemia   . Hypertension   . Monoallelic mutation of NBN gene   . Osteoporosis   . Skin cancer of nose     Past Surgical History:  Procedure Laterality Date  . APPENDECTOMY    . BREAST LUMPECTOMY WITH RADIOACTIVE SEED AND SENTINEL LYMPH NODE BIOPSY Right 02/03/2015   Procedure: RIGHT BREAST LUMPECTOMY WITH RADIOACTIVE SEED AND RIGHT SENTINEL LYMPH NODE BIOPSY;  Surgeon: [name], MD;  Location: [place];  Service: General;  Laterality: Right;  .  BREAST SURGERY  11/2012   biopsy, benign breast tissue  . COLONOSCOPY  06/2010   rec  . LITHOTRIPSY     kidney stone on left side  . MASTECTOMY W/ SENTINEL NODE BIOPSY Right 03/07/2017   Procedure: RIGHT TOTAL MASTECTOMY WITH RIGHT AXILLARY SENTINEL LYMPH NODE BIOPSY;  Surgeon: [name], MD;  Location: [place];  Service: General;  Laterality: Right;  . OVARIAN CYST REMOVAL  age 52  . POLYPECTOMY  04/2005   colon  . PORTACATH PLACEMENT Left 02/03/2015   Procedure: INSERTION PORT-A-CATH WITH ULTRA SOUND GUIDANCE;  Surgeon: [name], MD;  Location: [place];  Service: General;  Laterality: Left;  . reexcision  10/09/2017   Dr. Lucia Gaskins, chest wall excision for recurrent breast cancer.     Current Outpatient Medications  Medication Sig Dispense Refill  . Biotin 5000 MCG CAPS Take by mouth.    . calcium-vitamin D (OSCAL WITH D) 250-125 MG-UNIT tablet Take 1 tablet by mouth daily. Patient takes calcium 1200mg  plus vitamin D3 1,000 IU    . cholecalciferol (VITAMIN D) 1000 units tablet Take 2,000 Units by mouth daily.    . CRESTOR 20 MG tablet  Take 1 tablet by mouth daily.  0  . cyanocobalamin 100 MCG tablet Take 100 mcg by mouth daily.    . hydrochlorothiazide (MICROZIDE) 12.5 MG capsule Take 12.5 mg by mouth daily.     Marland Kitchen losartan (COZAAR) 100 MG tablet Take 100 mg by mouth daily.  0  . Multiple Vitamins-Minerals (EMERGEN-C IMMUNE PO) Take by mouth.    Levin Erp SULFATE VAGINAL (TRIMO-SAN) 0.025 % GEL Place 1 application vaginally once a week.     No current facility-administered medications for this visit.      ALLERGIES: Patient has no known allergies.  Family History  Problem Relation Age of Onset  . Colon cancer Sister 57       died at 53  . Breast cancer Mother 72  . Hypertension Mother   . Osteoporosis Mother   . Heart disease Father        CABG  . Dementia Father   . Throat cancer Brother 60  . Breast cancer Sister 38  . Lymphoma Sister 25  .  Melanoma Sister   . Skin cancer Brother   . Skin cancer Daughter     Social History   Socioeconomic History  . Marital status: Widowed    Spouse name: Not on file  . Number of children: 2  . Years of education: Not on file  . Highest education level: Not on file  Occupational History  . Not on file  Social Needs  . Financial resource strain: Not on file  . Food insecurity    Worry: Not on file    Inability: Not on file  . Transportation needs    Medical: No    Non-medical: No  Tobacco Use  . Smoking status: Never Smoker  . Smokeless tobacco: Never Used  Substance and Sexual Activity  . Alcohol use: Yes    Comment: rarely  . Drug use: No  . Sexual activity: Not Currently    Partners: Male    Birth control/protection: Post-menopausal  Lifestyle  . Physical activity    Days per week: Not on file    Minutes per session: Not on file  . Stress: Not on file  Relationships  . Social Herbalist on phone: Not on file    Gets together: Not on file    Attends religious service: Not on file    Active member of club or organization: Not on file    Attends meetings of clubs or organizations: Not on file    Relationship status: Not on file  . Intimate partner violence    Fear of current or ex partner: No    Emotionally abused: No    Physically abused: No    Forced sexual activity: No  Other Topics Concern  . Not on file  Social History Narrative  . Not on file    ROS  PHYSICAL EXAMINATION:    LMP 01/17/1982 (Approximate)     General appearance: alert, cooperative and appears stated age Neck: no adenopathy, supple, symmetrical, trachea midline and thyroid {CHL AMB PHY EX THYROID NORM [phone]::"normal to inspection and palpation"} Breasts: {Exam; breast:[phone]::"normal appearance, no masses or tenderness"} Abdomen: soft, non-tender; non distended, no masses,  no organomegaly  Pelvic: External genitalia:  no lesions              Urethra:  normal  appearing urethra with no masses, tenderness or lesions              Bartholins and Skenes:  normal                 Vagina: normal appearing vagina with normal color and discharge, no lesions              Cervix: {CHL AMB PHY EX CERVIX NORM DEFAULT:[phone]::"no lesions"}              Bimanual Exam:  Uterus:  {CHL AMB PHY EX UTERUS NORM [phone]::"normal size, contour, position, consistency, mobility, non-tender"}              Adnexa: {CHL AMB PHY EX ADNEXA NO MASS [phone]::"no mass, fullness, tenderness"}              Rectovaginal: {yes no:[id number]}.  Confirms.              Anus:  normal sphincter tone, no lesions  Chaperone was present for exam.  ASSESSMENT     PLAN    An After Visit Summary was printed and given to the patient.  *** minutes face to face time of which over 50% was spent in counseling.

## 2018-09-20 ENCOUNTER — Telehealth: Payer: Self-pay | Admitting: Obstetrics and Gynecology

## 2018-09-20 ENCOUNTER — Other Ambulatory Visit: Payer: Self-pay

## 2018-09-20 NOTE — Telephone Encounter (Signed)
Call returned to patient, left detailed message, ok per dpr. Advised patient no special instructions for 09/25/18 PUS, do not need to arrive with full bladder. Return call to office if any additional questions.   Encounter closed.

## 2018-09-20 NOTE — Telephone Encounter (Signed)
Patient states she had no special instructions for ultrasound 09/25/18. Would like to know if she needs a full bladder or not.

## 2018-09-25 ENCOUNTER — Other Ambulatory Visit: Payer: Self-pay

## 2018-09-25 ENCOUNTER — Ambulatory Visit (INDEPENDENT_AMBULATORY_CARE_PROVIDER_SITE_OTHER): Payer: Medicare Other

## 2018-09-25 ENCOUNTER — Other Ambulatory Visit: Payer: Medicare Other | Admitting: Obstetrics and Gynecology

## 2018-09-25 ENCOUNTER — Other Ambulatory Visit: Payer: Medicare Other

## 2018-09-25 ENCOUNTER — Ambulatory Visit (INDEPENDENT_AMBULATORY_CARE_PROVIDER_SITE_OTHER): Payer: Medicare Other | Admitting: Obstetrics and Gynecology

## 2018-09-25 ENCOUNTER — Encounter: Payer: Self-pay | Admitting: Obstetrics and Gynecology

## 2018-09-25 VITALS — BP 124/72 | HR 80 | Temp 97.2°F | Wt 145.3 lb

## 2018-09-25 DIAGNOSIS — N950 Postmenopausal bleeding: Secondary | ICD-10-CM

## 2018-09-25 NOTE — Progress Notes (Signed)
GYNECOLOGY  VISIT   HPI: 78 y.o.   Widowed White or Caucasian Not Hispanic or Latino  female   [phone] with Patient's last menstrual period was 01/17/1982 (approximate).   here for evaluation of PMP spotting (source unclear).     GYNECOLOGIC HISTORY: Patient's last menstrual period was 01/17/1982 (approximate). Contraception:PMP Menopausal hormone therapy: None        OB History    Gravida  2   Para  2   Term  2   Preterm  0   AB  0   Living  2     SAB  0   TAB  0   Ectopic  0   Multiple  0   Live Births  2              Patient Active Problem List   Diagnosis Date Noted  . 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 Trochanteric bursitis of right hip 07/06/2017  . Recurrent breast cancer, right ([place]) 03/07/2017  . Monoallelic mutation of NBN gene   . Genetic testing 01/13/2015  . Family history of breast cancer   . Family history of colon cancer   . Breast cancer of lower-inner quadrant of right female breast ([place]) 12/30/2014    Past Medical History:  Diagnosis Date  . Breast cancer ([place]) 11/2014  . Family history of breast cancer   . Family history of colon cancer   . History of kidney stones   . History of radiation therapy 08/11/15- 09/23/2015   Right Breast  . History of radiation therapy 11/13/17- 12/29/17   Right chest wall and IM nodes 50.04 Gy in 28 fractions, Right supraclavicular and PAB nodes 50.04 Gy in 28 fractions, Right Chest wall boost/ 10 Gy in 5 fractions.   . Hyperlipidemia   . Hypertension   . Monoallelic mutation of NBN gene   . Osteoporosis   . Skin cancer of nose     Past Surgical History:  Procedure Laterality Date  . APPENDECTOMY    . BREAST LUMPECTOMY WITH RADIOACTIVE SEED AND SENTINEL LYMPH NODE BIOPSY Right 02/03/2015   Procedure: RIGHT BREAST LUMPECTOMY WITH RADIOACTIVE SEED AND RIGHT SENTINEL LYMPH NODE BIOPSY;  Surgeon: [name], MD;  Location: [place];   Service: General;  Laterality: Right;  . BREAST SURGERY  11/2012   biopsy, benign breast tissue  . COLONOSCOPY  06/2010   rec  . LITHOTRIPSY     kidney stone on left side  . MASTECTOMY W/ SENTINEL NODE BIOPSY Right 03/07/2017   Procedure: RIGHT TOTAL MASTECTOMY WITH RIGHT AXILLARY SENTINEL LYMPH NODE BIOPSY;  Surgeon: [name], MD;  Location: [place];  Service: General;  Laterality: Right;  . OVARIAN CYST REMOVAL  age 32  . POLYPECTOMY  04/2005   colon  . PORTACATH PLACEMENT Left 02/03/2015   Procedure: INSERTION PORT-A-CATH WITH ULTRA SOUND GUIDANCE;  Surgeon: [name], MD;  Location: [place];  Service: General;  Laterality: Left;  . reexcision  10/09/2017   Dr. Lucia Gaskins, chest wall excision for recurrent breast cancer.     Current Outpatient Medications  Medication Sig Dispense Refill  . b complex vitamins tablet Take 1 tablet by mouth daily.    . Biotin 5000 MCG CAPS Take by mouth.    . calcium-vitamin D (OSCAL WITH D) 250-125 MG-UNIT tablet Take 1 tablet by mouth daily. Patient takes calcium 1200mg  plus vitamin D3 1,000 IU    .  cholecalciferol (VITAMIN D) 1000 units tablet Take 2,000 Units by mouth daily.    . CRESTOR 20 MG tablet Take 1 tablet by mouth daily.  0  . cyanocobalamin 100 MCG tablet Take 100 mcg by mouth daily.    . hydrochlorothiazide (MICROZIDE) 12.5 MG capsule Take 12.5 mg by mouth daily.     Marland Kitchen losartan (COZAAR) 100 MG tablet Take 100 mg by mouth daily.  0  . Multiple Vitamins-Minerals (EMERGEN-C IMMUNE PO) Take by mouth.    Levin Erp SULFATE VAGINAL (TRIMO-SAN) 0.025 % GEL Place 1 application vaginally once a week.     No current facility-administered medications for this visit.      ALLERGIES: Patient has no known allergies.  Family History  Problem Relation Age of Onset  . Colon cancer Sister 73       died at 71  . Breast cancer Mother 75  . Hypertension Mother   . Osteoporosis Mother   . Heart disease Father        CABG  .  Dementia Father   . Throat cancer Brother 65  . Breast cancer Sister 83  . Lymphoma Sister 62  . Melanoma Sister   . Skin cancer Brother   . Skin cancer Daughter     Social History   Socioeconomic History  . Marital status: Widowed    Spouse name: Not on file  . Number of children: 2  . Years of education: Not on file  . Highest education level: Not on file  Occupational History  . Not on file  Social Needs  . Financial resource strain: Not on file  . Food insecurity    Worry: Not on file    Inability: Not on file  . Transportation needs    Medical: Not on file    Non-medical: Not on file  Tobacco Use  . Smoking status: Never Smoker  . Smokeless tobacco: Never Used  Substance and Sexual Activity  . Alcohol use: Yes    Comment: rarely  . Drug use: No  . Sexual activity: Not Currently    Partners: Male    Birth control/protection: Post-menopausal  Lifestyle  . Physical activity    Days per week: Not on file    Minutes per session: Not on file  . Stress: Not on file  Relationships  . Social Herbalist on phone: Not on file    Gets together: Not on file    Attends religious service: Not on file    Active member of club or organization: Not on file    Attends meetings of clubs or organizations: Not on file    Relationship status: Not on file  . Intimate partner violence    Fear of current or ex partner: Not on file    Emotionally abused: Not on file    Physically abused: Not on file    Forced sexual activity: Not on file  Other Topics Concern  . Not on file  Social History Narrative  . Not on file    Review of Systems  Constitutional: Negative.   HENT: Negative.   Eyes: Negative.   Respiratory: Negative.   Cardiovascular: Negative.   Gastrointestinal: Negative.   Genitourinary: Negative.   Musculoskeletal: Negative.   Skin: Negative.   Neurological: Negative.   Endo/Heme/Allergies: Negative.   Psychiatric/Behavioral: Negative.      PHYSICAL EXAMINATION:    BP 124/72 (BP Location: Right Arm, Patient Position: Sitting, Cuff Size: Normal)   Pulse 80  Temp (!) 97.2 F (36.2 C) (Skin)   Wt 145 lb 4.8 oz (65.9 kg)   LMP 01/17/1982 (Approximate)   BMI 21.77 kg/m     General appearance: alert, cooperative and appears stated age   Ultrasound images reviewed with the patient.   ASSESSMENT Postmenopausal spotting, source of bleeding not clear. Normal gyn ultrasound with a thin endometrial stripe    PLAN Patient reassured Call with further bleeding, will try and see her that day.    An After Visit Summary was printed and given to the patient.

## 2018-10-08 DIAGNOSIS — C44219 Basal cell carcinoma of skin of left ear and external auricular canal: Secondary | ICD-10-CM | POA: Diagnosis not present

## 2018-10-08 DIAGNOSIS — D485 Neoplasm of uncertain behavior of skin: Secondary | ICD-10-CM | POA: Diagnosis not present

## 2018-10-08 DIAGNOSIS — Z85828 Personal history of other malignant neoplasm of skin: Secondary | ICD-10-CM | POA: Diagnosis not present

## 2018-10-24 DIAGNOSIS — C44229 Squamous cell carcinoma of skin of left ear and external auricular canal: Secondary | ICD-10-CM | POA: Diagnosis not present

## 2018-10-24 DIAGNOSIS — Z85828 Personal history of other malignant neoplasm of skin: Secondary | ICD-10-CM | POA: Diagnosis not present

## 2018-11-01 ENCOUNTER — Ambulatory Visit
Admission: RE | Admit: 2018-11-01 | Discharge: 2018-11-01 | Disposition: A | Discharge status: Home / Self Care | Payer: Medicare Other | Source: Ambulatory Visit | Attending: Internal Medicine | Admitting: Internal Medicine

## 2018-11-01 DIAGNOSIS — J181 Lobar pneumonia, unspecified organism: Secondary | ICD-10-CM | POA: Diagnosis not present

## 2018-11-01 DIAGNOSIS — J439 Emphysema, unspecified: Secondary | ICD-10-CM | POA: Diagnosis not present

## 2018-11-01 DIAGNOSIS — J984 Other disorders of lung: Secondary | ICD-10-CM | POA: Diagnosis not present

## 2018-11-01 DIAGNOSIS — R918 Other nonspecific abnormal finding of lung field: Secondary | ICD-10-CM

## 2018-11-01 DIAGNOSIS — I700 Atherosclerosis of aorta: Secondary | ICD-10-CM | POA: Diagnosis not present

## 2018-11-01 MED ORDER — IOPAMIDOL (ISOVUE-300) INJECTION 61%
75.0000 mL | Freq: Once | INTRAVENOUS | Status: AC | PRN
  Administered 2018-11-01: 75 mL via INTRAVENOUS

## 2018-11-08 DIAGNOSIS — E7849 Other hyperlipidemia: Secondary | ICD-10-CM | POA: Diagnosis not present

## 2018-11-08 DIAGNOSIS — E559 Vitamin D deficiency, unspecified: Secondary | ICD-10-CM | POA: Diagnosis not present

## 2018-11-09 DIAGNOSIS — I10 Essential (primary) hypertension: Secondary | ICD-10-CM | POA: Diagnosis not present

## 2018-11-09 DIAGNOSIS — R82998 Other abnormal findings in urine: Secondary | ICD-10-CM | POA: Diagnosis not present

## 2018-11-12 DIAGNOSIS — Z1212 Encounter for screening for malignant neoplasm of rectum: Secondary | ICD-10-CM | POA: Diagnosis not present

## 2018-11-15 DIAGNOSIS — Z0000 Encounter for general adult medical examination without abnormal findings: Secondary | ICD-10-CM | POA: Diagnosis not present

## 2018-11-15 DIAGNOSIS — R609 Edema, unspecified: Secondary | ICD-10-CM | POA: Diagnosis not present

## 2018-11-15 DIAGNOSIS — J439 Emphysema, unspecified: Secondary | ICD-10-CM | POA: Diagnosis not present

## 2018-11-15 DIAGNOSIS — I700 Atherosclerosis of aorta: Secondary | ICD-10-CM | POA: Diagnosis not present

## 2018-12-05 ENCOUNTER — Other Ambulatory Visit: Payer: Self-pay

## 2018-12-05 NOTE — Progress Notes (Signed)
GYNECOLOGY  VISIT   HPI: 78 y.o.   Widowed White or Caucasian Not Hispanic or Latino  female   [phone] with Patient's last menstrual period was 01/17/1982 (approximate).   here for 3 month pessary check. She has genital prolapse that is well controlled with a #5 ring pessary with support. No c/o. No vaginal bleeding, no bowel or bladder c/o.   GYNECOLOGIC HISTORY: Patient's last menstrual period was 01/17/1982 (approximate). Contraception: Postmenopausal Menopausal hormone therapy: Trimosan vaginal cream        OB History    Gravida  2   Para  2   Term  2   Preterm  0   AB  0   Living  2     SAB  0   TAB  0   Ectopic  0   Multiple  0   Live Births  2              Patient Active Problem List   Diagnosis Date Noted  . 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 Trochanteric bursitis of right hip 07/06/2017  . Recurrent breast cancer, right ([place]) 03/07/2017  . Monoallelic mutation of NBN gene   . Genetic testing 01/13/2015  . Family history of breast cancer   . Family history of colon cancer   . Breast cancer of lower-inner quadrant of right female breast ([place]) 12/30/2014    Past Medical History:  Diagnosis Date  . Breast cancer ([place]) 11/2014  . Family history of breast cancer   . Family history of colon cancer   . History of kidney stones   . History of radiation therapy 08/11/15- 09/23/2015   Right Breast  . History of radiation therapy 11/13/17- 12/29/17   Right chest wall and IM nodes 50.04 Gy in 28 fractions, Right supraclavicular and PAB nodes 50.04 Gy in 28 fractions, Right Chest wall boost/ 10 Gy in 5 fractions.   . Hyperlipidemia   . Hypertension   . Monoallelic mutation of NBN gene   . Osteoporosis   . Skin cancer of nose     Past Surgical History:  Procedure Laterality Date  . APPENDECTOMY    . BREAST LUMPECTOMY WITH RADIOACTIVE SEED AND SENTINEL LYMPH NODE BIOPSY Right 02/03/2015   Procedure: RIGHT  BREAST LUMPECTOMY WITH RADIOACTIVE SEED AND RIGHT SENTINEL LYMPH NODE BIOPSY;  Surgeon: [name], MD;  Location: [facility];  Service: General;  Laterality: Right;  . BREAST SURGERY  11/2012   biopsy, benign breast tissue  . COLONOSCOPY  06/2010   rec  . LITHOTRIPSY     kidney stone on left side  . MASTECTOMY W/ SENTINEL NODE BIOPSY Right 03/07/2017   Procedure: RIGHT TOTAL MASTECTOMY WITH RIGHT AXILLARY SENTINEL LYMPH NODE BIOPSY;  Surgeon: [name], MD;  Location: [facility];  Service: General;  Laterality: Right;  . OVARIAN CYST REMOVAL  age 85  . POLYPECTOMY  04/2005   colon  . PORTACATH PLACEMENT Left 02/03/2015   Procedure: INSERTION PORT-A-CATH WITH ULTRA SOUND GUIDANCE;  Surgeon: [name], MD;  Location: [facility];  Service: General;  Laterality: Left;  . reexcision  10/09/2017   Dr. Lucia Gaskins, chest wall excision for recurrent breast cancer.     Current Outpatient Medications  Medication Sig Dispense Refill  . b complex vitamins tablet Take 1 tablet by mouth daily.    . Biotin 5000 MCG CAPS Take by mouth.    . calcium-vitamin D (OSCAL  WITH D) 250-125 MG-UNIT tablet Take 1 tablet by mouth daily. Patient takes calcium 1200mg  plus vitamin D3 1,000 IU    . cholecalciferol (VITAMIN D) 1000 units tablet Take 2,000 Units by mouth daily.    . CRESTOR 20 MG tablet Take 1 tablet by mouth daily.  0  . cyanocobalamin 100 MCG tablet Take 100 mcg by mouth daily.    . hydrochlorothiazide (MICROZIDE) 12.5 MG capsule Take 12.5 mg by mouth daily.     Marland Kitchen losartan (COZAAR) 100 MG tablet Take 100 mg by mouth daily.  0  . Multiple Vitamins-Minerals (EMERGEN-C IMMUNE PO) Take by mouth.    Levin Erp SULFATE VAGINAL (TRIMO-SAN) 0.025 % GEL Place 1 application vaginally once a week.     No current facility-administered medications for this visit.      ALLERGIES: Patient has no known allergies.  Family History  Problem Relation Age of Onset  . Colon cancer  Sister 67       died at 19  . Breast cancer Mother 49  . Hypertension Mother   . Osteoporosis Mother   . Heart disease Father        CABG  . Dementia Father   . Throat cancer Brother 74  . Breast cancer Sister 75  . Lymphoma Sister 73  . Melanoma Sister   . Skin cancer Brother   . Skin cancer Daughter     Social History   Socioeconomic History  . Marital status: Widowed    Spouse name: Not on file  . Number of children: 2  . Years of education: Not on file  . Highest education level: Not on file  Occupational History  . Not on file  Social Needs  . Financial resource strain: Not on file  . Food insecurity    Worry: Not on file    Inability: Not on file  . Transportation needs    Medical: Not on file    Non-medical: Not on file  Tobacco Use  . Smoking status: Never Smoker  . Smokeless tobacco: Never Used  Substance and Sexual Activity  . Alcohol use: Yes    Comment: rarely  . Drug use: No  . Sexual activity: Not Currently    Partners: Male    Birth control/protection: Post-menopausal  Lifestyle  . Physical activity    Days per week: Not on file    Minutes per session: Not on file  . Stress: Not on file  Relationships  . Social Herbalist on phone: Not on file    Gets together: Not on file    Attends religious service: Not on file    Active member of club or organization: Not on file    Attends meetings of clubs or organizations: Not on file    Relationship status: Not on file  . Intimate partner violence    Fear of current or ex partner: Not on file    Emotionally abused: Not on file    Physically abused: Not on file    Forced sexual activity: Not on file  Other Topics Concern  . Not on file  Social History Narrative  . Not on file    Review of Systems  Constitutional: Negative.   HENT: Negative.   Eyes: Negative.   Respiratory: Negative.   Cardiovascular: Negative.   Gastrointestinal: Negative.   Genitourinary: Negative.    Musculoskeletal: Negative.   Skin: Negative.   Neurological: Negative.   Endo/Heme/Allergies: Negative.   Psychiatric/Behavioral: Negative.  PHYSICAL EXAMINATION:    BP (!) 142/90 (BP Location: Left Arm, Patient Position: Sitting, Cuff Size: Normal)   Pulse 68   Temp (!) 97 F (36.1 C) (Skin)   Wt 145 lb (65.8 kg)   LMP 01/17/1982 (Approximate)   BMI 21.73 kg/m     General appearance: alert, cooperative and appears stated age  Pelvic: External genitalia:  no lesions              Urethra:  normal appearing urethra with no masses, tenderness or lesions              Bartholins and Skenes: normal                 Vagina: the pessary was removed and cleaned. There is an area of erythema posterior to the cervix, ~3 x 1 cm, no erosion, no granulation tissue.               Cervix: no lesions  Chaperone was present for exam.  ASSESSMENT Genital prolapse, well controlled with the pessary Mild vaginal irritation. She has a h/o breast cancer and doesn't use vaginal estrogen. 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   An After Visit Summary was printed and given to the patient.

## 2018-12-06 ENCOUNTER — Encounter: Payer: Self-pay | Admitting: Obstetrics and Gynecology

## 2018-12-06 ENCOUNTER — Telehealth: Payer: Self-pay | Admitting: Obstetrics and Gynecology

## 2018-12-06 ENCOUNTER — Ambulatory Visit (INDEPENDENT_AMBULATORY_CARE_PROVIDER_SITE_OTHER): Payer: Medicare Other | Admitting: Obstetrics and Gynecology

## 2018-12-06 VITALS — BP 142/90 | HR 68 | Temp 97.0°F | Wt 145.0 lb

## 2018-12-06 DIAGNOSIS — N899 Noninflammatory disorder of vagina, unspecified: Secondary | ICD-10-CM | POA: Diagnosis not present

## 2018-12-06 DIAGNOSIS — Z4689 Encounter for fitting and adjustment of other specified devices: Secondary | ICD-10-CM | POA: Diagnosis not present

## 2018-12-06 DIAGNOSIS — T8389XA Other specified complication of genitourinary prosthetic devices, implants and grafts, initial encounter: Secondary | ICD-10-CM

## 2018-12-06 DIAGNOSIS — N819 Female genital prolapse, unspecified: Secondary | ICD-10-CM | POA: Diagnosis not present

## 2018-12-06 NOTE — Telephone Encounter (Signed)
Spoke to pt. Pt scheduled for 4 week pessary recheck 01/02/19 at 3:30pm. Pt agreeable.  Routing to provider for final review. Patient is agreeable to disposition. Will close encounter.

## 2018-12-06 NOTE — Telephone Encounter (Signed)
Dr. Talbert Nan would like to see patient in 4 weeks for pessary recheck. Sending message to triage to assist with scheduling.

## 2019-01-01 ENCOUNTER — Other Ambulatory Visit: Payer: Self-pay

## 2019-01-01 NOTE — Progress Notes (Signed)
GYNECOLOGY  VISIT   HPI: 78 y.o.   Widowed White or Caucasian Not Hispanic or Latino  female   [phone] with Patient's last menstrual period was 01/17/1982 (approximate).   here for pessary check.  She has genital prolapse that is well controlled with a #5 ring pessary with support.  At her visit last month she was noted to have a 3 x 1 cm area of erythema in the posterior vaginal apex. She has a h/o breast cancer and uses Trimosan cream instead of vaginal estrogen. Since her last visit she increased the Trimosan cream to 2 x a week. 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  She is without c/o, no bleeding, no abnormal discharge.   GYNECOLOGIC HISTORY: Patient's last menstrual period was 01/17/1982 (approximate). Contraception:Postmenopausal Menopausal hormone therapy: Trimosan cream twice weekly        OB History    Gravida  2   Para  2   Term  2   Preterm  0   AB  0   Living  2     SAB  0   TAB  0   Ectopic  0   Multiple  0   Live Births  2              Patient Active Problem List   Diagnosis Date Noted  . 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 Trochanteric bursitis of right hip 07/06/2017  . Recurrent breast cancer, right ([place]) 03/07/2017  . Monoallelic mutation of NBN gene   . Genetic testing 01/13/2015  . Family history of breast cancer   . Family history of colon cancer   . Breast cancer of lower-inner quadrant of right female breast ([place]) 12/30/2014    Past Medical History:  Diagnosis Date  . Breast cancer ([place]) 11/2014  . Family history of breast cancer   . Family history of colon cancer   . History of kidney stones   . History of radiation therapy 08/11/15- 09/23/2015   Right Breast  . History of radiation therapy 11/13/17- 12/29/17   Right chest wall and IM nodes 50.04 Gy in 28 fractions, Right supraclavicular and PAB nodes 50.04 Gy in 28 fractions, Right Chest wall boost/  10 Gy in 5 fractions.   . Hyperlipidemia   . Hypertension   . Monoallelic mutation of NBN gene   . Osteoporosis   . Skin cancer of nose     Past Surgical History:  Procedure Laterality Date  . APPENDECTOMY    . BREAST LUMPECTOMY WITH RADIOACTIVE SEED AND SENTINEL LYMPH NODE BIOPSY Right 02/03/2015   Procedure: RIGHT BREAST LUMPECTOMY WITH RADIOACTIVE SEED AND RIGHT SENTINEL LYMPH NODE BIOPSY;  Surgeon: [name], MD;  Location: [facility];  Service: General;  Laterality: Right;  . BREAST SURGERY  11/2012   biopsy, benign breast tissue  . COLONOSCOPY  06/2010   rec  . LITHOTRIPSY     kidney stone on left side  . MASTECTOMY W/ SENTINEL NODE BIOPSY Right 03/07/2017   Procedure: RIGHT TOTAL MASTECTOMY WITH RIGHT AXILLARY SENTINEL LYMPH NODE BIOPSY;  Surgeon: [name], MD;  Location: [facility];  Service: General;  Laterality: Right;  . OVARIAN CYST REMOVAL  age 50  . POLYPECTOMY  04/2005   colon  . PORTACATH PLACEMENT Left 02/03/2015   Procedure: INSERTION PORT-A-CATH WITH ULTRA SOUND GUIDANCE;  Surgeon: [name], MD;  Location: [facility];  Service: General;  Laterality: Left;  . reexcision  10/09/2017   Dr. Lucia Gaskins, chest wall excision for recurrent breast cancer.     Current Outpatient Medications  Medication Sig Dispense Refill  . b complex vitamins tablet Take 1 tablet by mouth daily.    . Biotin 5000 MCG CAPS Take by mouth.    . calcium-vitamin D (OSCAL WITH D) 250-125 MG-UNIT tablet Take 1 tablet by mouth daily. Patient takes calcium 1200mg  plus vitamin D3 1,000 IU    . cholecalciferol (VITAMIN D) 1000 units tablet Take 2,000 Units by mouth daily.    . CRESTOR 20 MG tablet Take 1 tablet by mouth daily.  0  . cyanocobalamin 100 MCG tablet Take 100 mcg by mouth daily.    . hydrochlorothiazide (MICROZIDE) 12.5 MG capsule Take 12.5 mg by mouth daily.     Marland Kitchen losartan (COZAAR) 100 MG tablet Take 100 mg by mouth daily.  0  . Multiple Vitamins-Minerals  (EMERGEN-C IMMUNE PO) Take by mouth.    Levin Erp SULFATE VAGINAL (TRIMO-SAN) 0.025 % GEL Place 1 application vaginally once a week.     No current facility-administered medications for this visit.     ALLERGIES: Patient has no known allergies.  Family History  Problem Relation Age of Onset  . Colon cancer Sister 39       died at 9  . Breast cancer Mother 64  . Hypertension Mother   . Osteoporosis Mother   . Heart disease Father        CABG  . Dementia Father   . Throat cancer Brother 81  . Breast cancer Sister 76  . Lymphoma Sister 30  . Melanoma Sister   . Skin cancer Brother   . Skin cancer Daughter     Social History   Socioeconomic History  . Marital status: Widowed    Spouse name: Not on file  . Number of children: 2  . Years of education: Not on file  . Highest education level: Not on file  Occupational History  . Not on file  Tobacco Use  . Smoking status: Never Smoker  . Smokeless tobacco: Never Used  Substance and Sexual Activity  . Alcohol use: Yes    Comment: rarely  . Drug use: No  . Sexual activity: Not Currently    Partners: Male    Birth control/protection: Post-menopausal  Other Topics Concern  . Not on file  Social History Narrative  . Not on file   Social Determinants of Health   Financial Resource Strain:   . Difficulty of Paying Living Expenses: Not on file  Food Insecurity:   . Worried About Charity fundraiser in the Last Year: Not on file  . Ran Out of Food in the Last Year: Not on file  Transportation Needs:   . Lack of Transportation (Medical): Not asked  . Lack of Transportation (Non-Medical): Not asked  Physical Activity:   . Days of Exercise per Week: Not on file  . Minutes of Exercise per Session: Not on file  Stress:   . Feeling of Stress : Not on file  Social Connections:   . Frequency of Communication with Friends and Family: Not on file  . Frequency of Social Gatherings with Friends and Family: Not on file  .  Attends Religious Services: Not on file  . Active Member of Clubs or Organizations: Not on file  . Attends Archivist Meetings: Not on file  . Marital Status: Not on file  Intimate Partner Violence: Unknown  . Fear of Current or Ex-Partner: Not asked  . Emotionally Abused: Not asked  . Physically Abused: Not asked  . Sexually Abused: Not asked    Review of Systems  Constitutional: Negative.   HENT: Negative.   Eyes: Negative.   Respiratory: Negative.   Cardiovascular: Negative.   Gastrointestinal: Negative.   Genitourinary: Negative.   Musculoskeletal: Negative.   Skin: Negative.   Neurological: Negative.   Endo/Heme/Allergies: Negative.   Psychiatric/Behavioral: Negative.     PHYSICAL EXAMINATION:    BP 136/82 (BP Location: Right Arm, Patient Position: Sitting, Cuff Size: Normal)   Pulse 76   Temp (!) 97.1 F (36.2 C) (Skin)   Wt 145 lb 9.6 oz (66 kg)   LMP 01/17/1982 (Approximate)   BMI 21.82 kg/m     General appearance: alert, cooperative and appears stated age  Pelvic: External genitalia:  no lesions              Urethra:  normal appearing urethra with no masses, tenderness or lesions              Bartholins and Skenes: normal                 Vagina: the pessary was removed and cleaned, her introitus is tight and tears slightly posteriorly with removal. She has some worsening erythema in the posterior vaginal apex, ~3 x 1 cm.              Cervix: no lesions                Chaperone was present for exam.  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 I think this could help her vaginal irritation.    An After Visit Summary was printed and given to the  patient.

## 2019-01-02 ENCOUNTER — Encounter: Payer: Self-pay | Admitting: Obstetrics and Gynecology

## 2019-01-02 ENCOUNTER — Ambulatory Visit (INDEPENDENT_AMBULATORY_CARE_PROVIDER_SITE_OTHER): Payer: Medicare Other | Admitting: Obstetrics and Gynecology

## 2019-01-02 VITALS — BP 136/82 | HR 76 | Temp 97.1°F | Wt 145.6 lb

## 2019-01-02 DIAGNOSIS — Z853 Personal history of malignant neoplasm of breast: Secondary | ICD-10-CM

## 2019-01-02 DIAGNOSIS — Z4689 Encounter for fitting and adjustment of other specified devices: Secondary | ICD-10-CM

## 2019-01-02 DIAGNOSIS — T8389XA Other specified complication of genitourinary prosthetic devices, implants and grafts, initial encounter: Secondary | ICD-10-CM

## 2019-01-02 DIAGNOSIS — N899 Noninflammatory disorder of vagina, unspecified: Secondary | ICD-10-CM

## 2019-01-02 DIAGNOSIS — N898 Other specified noninflammatory disorders of vagina: Secondary | ICD-10-CM

## 2019-01-08 DIAGNOSIS — Z23 Encounter for immunization: Secondary | ICD-10-CM | POA: Diagnosis not present

## 2019-01-17 ENCOUNTER — Other Ambulatory Visit: Payer: Self-pay

## 2019-01-21 ENCOUNTER — Encounter: Payer: Self-pay | Admitting: Obstetrics and Gynecology

## 2019-01-21 ENCOUNTER — Other Ambulatory Visit: Payer: Self-pay

## 2019-01-21 ENCOUNTER — Ambulatory Visit (INDEPENDENT_AMBULATORY_CARE_PROVIDER_SITE_OTHER): Payer: Medicare Other | Admitting: Obstetrics and Gynecology

## 2019-01-21 VITALS — BP 140/82 | HR 81 | Temp 97.4°F | Ht 68.5 in | Wt 145.0 lb

## 2019-01-21 DIAGNOSIS — N898 Other specified noninflammatory disorders of vagina: Secondary | ICD-10-CM

## 2019-01-21 DIAGNOSIS — N8111 Cystocele, midline: Secondary | ICD-10-CM

## 2019-01-21 DIAGNOSIS — N899 Noninflammatory disorder of vagina, unspecified: Secondary | ICD-10-CM | POA: Diagnosis not present

## 2019-01-21 DIAGNOSIS — N952 Postmenopausal atrophic vaginitis: Secondary | ICD-10-CM

## 2019-01-21 DIAGNOSIS — T8389XA Other specified complication of genitourinary prosthetic devices, implants and grafts, initial encounter: Secondary | ICD-10-CM

## 2019-01-21 DIAGNOSIS — N814 Uterovaginal prolapse, unspecified: Secondary | ICD-10-CM | POA: Diagnosis not present

## 2019-01-21 DIAGNOSIS — Z853 Personal history of malignant neoplasm of breast: Secondary | ICD-10-CM

## 2019-01-21 MED ORDER — ESTRADIOL 0.1 MG/GM VA CREA: TOPICAL_CREAM | VAGINAL | 1 refills | Status: DC

## 2019-01-21 NOTE — Progress Notes (Signed)
GYNECOLOGY  VISIT   HPI: 79 y.o.   Widowed White or Caucasian Not Hispanic or Latino  female   [phone] with Patient's last menstrual period was 01/17/1982 (approximate).   here for pessary recheck. She has a h/o genital prolapse that has been well controlled with a #5 ring pessary with support. She developed vaginal irritation in 11/20, she was instructed to increase use of her Trimosan cream to 2 x a week. 2 weeks ago she was seen and the vaginal irritation was worse so the pessary was left out. She has been uncomfortable with the pessary out, notices it when she wipes. Her urinary stream is slower. No c/o pressure.  She has a h/o triple negative breast cancer. I reached out to Dr Lindi Adie (in Oncology) and he is okay with her using vaginal estrogen.    GYNECOLOGIC HISTORY: Patient's last menstrual period was 01/17/1982 (approximate). Contraception:post menopausal  Menopausal hormone therapy: none         OB History    Gravida  2   Para  2   Term  2   Preterm  0   AB  0   Living  2     SAB  0   TAB  0   Ectopic  0   Multiple  0   Live Births  2              Patient Active Problem List   Diagnosis Date Noted  . 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 Trochanteric bursitis of right hip 07/06/2017  . Recurrent breast cancer, right ([place]) 03/07/2017  . Monoallelic mutation of NBN gene   . Genetic testing 01/13/2015  . Family history of breast cancer   . Family history of colon cancer   . Breast cancer of lower-inner quadrant of right female breast ([facility]) 12/30/2014    Past Medical History:  Diagnosis Date  . Breast cancer ([facility]) 11/2014  . Family history of breast cancer   . Family history of colon cancer   . History of kidney stones   . History of radiation therapy 08/11/15- 09/23/2015   Right Breast  . History of radiation therapy 11/13/17- 12/29/17   Right chest wall and IM nodes 50.04 Gy in 28 fractions, Right  supraclavicular and PAB nodes 50.04 Gy in 28 fractions, Right Chest wall boost/ 10 Gy in 5 fractions.   . Hyperlipidemia   . Hypertension   . Monoallelic mutation of NBN gene   . Osteoporosis   . Skin cancer of nose     Past Surgical History:  Procedure Laterality Date  . APPENDECTOMY    . BREAST LUMPECTOMY WITH RADIOACTIVE SEED AND SENTINEL LYMPH NODE BIOPSY Right 02/03/2015   Procedure: RIGHT BREAST LUMPECTOMY WITH RADIOACTIVE SEED AND RIGHT SENTINEL LYMPH NODE BIOPSY;  Surgeon: [name], MD;  Location: [facility];  Service: General;  Laterality: Right;  . BREAST SURGERY  11/2012   biopsy, benign breast tissue  . COLONOSCOPY  06/2010   rec  . LITHOTRIPSY     kidney stone on left side  . MASTECTOMY W/ SENTINEL NODE BIOPSY Right 03/07/2017   Procedure: RIGHT TOTAL MASTECTOMY WITH RIGHT AXILLARY SENTINEL LYMPH NODE BIOPSY;  Surgeon: [name], MD;  Location: [facility];  Service: General;  Laterality: Right;  . OVARIAN CYST REMOVAL  age 10  . POLYPECTOMY  04/2005   colon  . PORTACATH PLACEMENT Left 02/03/2015   Procedure: INSERTION PORT-A-CATH WITH  ULTRA SOUND GUIDANCE;  Surgeon: [name], MD;  Location: [facility];  Service: General;  Laterality: Left;  . reexcision  10/09/2017   Dr. Lucia Gaskins, chest wall excision for recurrent breast cancer.     Current Outpatient Medications  Medication Sig Dispense Refill  . b complex vitamins tablet Take 1 tablet by mouth daily.    . Biotin 5000 MCG CAPS Take by mouth.    . calcium-vitamin D (OSCAL WITH D) 250-125 MG-UNIT tablet Take 1 tablet by mouth daily. Patient takes calcium 1200mg  plus vitamin D3 1,000 IU    . cholecalciferol (VITAMIN D) 1000 units tablet Take 2,000 Units by mouth daily.    . CRESTOR 20 MG tablet Take 1 tablet by mouth daily.  0  . cyanocobalamin 100 MCG tablet Take 100 mcg by mouth daily.    . hydrochlorothiazide (MICROZIDE) 12.5 MG capsule Take 12.5 mg by mouth daily.     Marland Kitchen losartan  (COZAAR) 100 MG tablet Take 100 mg by mouth daily.  0  . Multiple Vitamins-Minerals (EMERGEN-C IMMUNE PO) Take by mouth.    Levin Erp SULFATE VAGINAL (TRIMO-SAN) 0.025 % GEL Place 1 application vaginally once a week.     No current facility-administered medications for this visit.     ALLERGIES: Patient has no known allergies.  Family History  Problem Relation Age of Onset  . Colon cancer Sister 31       died at 57  . Breast cancer Mother 98  . Hypertension Mother   . Osteoporosis Mother   . Heart disease Father        CABG  . Dementia Father   . Throat cancer Brother 80  . Breast cancer Sister 42  . Lymphoma Sister 7  . Melanoma Sister   . Skin cancer Brother   . Skin cancer Daughter     Social History   Socioeconomic History  . Marital status: Widowed    Spouse name: Not on file  . Number of children: 2  . Years of education: Not on file  . Highest education level: Not on file  Occupational History  . Not on file  Tobacco Use  . Smoking status: Never Smoker  . Smokeless tobacco: Never Used  Substance and Sexual Activity  . Alcohol use: Yes    Comment: rarely  . Drug use: No  . Sexual activity: Not Currently    Partners: Male    Birth control/protection: Post-menopausal  Other Topics Concern  . Not on file  Social History Narrative  . Not on file   Social Determinants of Health   Financial Resource Strain:   . Difficulty of Paying Living Expenses: Not on file  Food Insecurity:   . Worried About Charity fundraiser in the Last Year: Not on file  . Ran Out of Food in the Last Year: Not on file  Transportation Needs:   . Lack of Transportation (Medical): Not asked  . Lack of Transportation (Non-Medical): Not asked  Physical Activity:   . Days of Exercise per Week: Not on file  . Minutes of Exercise per Session: Not on file  Stress:   . Feeling of Stress : Not on file  Social Connections:   . Frequency of Communication with Friends and Family:  Not on file  . Frequency of Social Gatherings with Friends and Family: Not on file  . Attends Religious Services: Not on file  . Active Member of Clubs or Organizations: Not on file  .  Attends Archivist Meetings: Not on file  . Marital Status: Not on file  Intimate Partner Violence: Unknown  . Fear of Current or Ex-Partner: Not asked  . Emotionally Abused: Not asked  . Physically Abused: Not asked  . Sexually Abused: Not asked    Review of Systems  All other systems reviewed and are negative.   PHYSICAL EXAMINATION:    BP 140/82   Pulse 81   Temp (!) 97.4 F (36.3 C)   Ht 5' 8.5" (1.74 m)   Wt 145 lb (65.8 kg)   LMP 01/17/1982 (Approximate)   SpO2 96%   BMI 21.73 kg/m     General appearance: alert, cooperative and appears stated age  Pelvic: External genitalia:  no lesions              Urethra:  normal appearing urethra with no masses, tenderness or lesions              Bartholins and Skenes: normal                 Vagina: atrophic appearing vagina, the prior area of irritation has healed. She has a large grade 2 cystocele and a grade 1 uterine prolapse. Her #5 ring pessary with support was replaced.               Cervix: no lesions             Chaperone was present for exam.  ASSESSMENT Midline cystocele and uterine prolapse, controlled with the pessary, but recent irritation from the pessary. 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  CC: Dr [name]

## 2019-01-23 DIAGNOSIS — L989 Disorder of the skin and subcutaneous tissue, unspecified: Secondary | ICD-10-CM | POA: Diagnosis not present

## 2019-01-23 DIAGNOSIS — C50911 Malignant neoplasm of unspecified site of right female breast: Secondary | ICD-10-CM | POA: Diagnosis not present

## 2019-01-25 ENCOUNTER — Encounter: Payer: Self-pay | Admitting: Hematology and Oncology

## 2019-02-07 ENCOUNTER — Other Ambulatory Visit: Payer: Self-pay

## 2019-02-07 ENCOUNTER — Encounter: Payer: Self-pay | Admitting: Obstetrics and Gynecology

## 2019-02-07 ENCOUNTER — Ambulatory Visit (INDEPENDENT_AMBULATORY_CARE_PROVIDER_SITE_OTHER): Payer: Medicare Other | Admitting: Obstetrics and Gynecology

## 2019-02-07 VITALS — BP 130/64 | HR 77 | Temp 97.7°F | Ht 68.5 in | Wt 146.2 lb

## 2019-02-07 DIAGNOSIS — Z4689 Encounter for fitting and adjustment of other specified devices: Secondary | ICD-10-CM

## 2019-02-07 DIAGNOSIS — N952 Postmenopausal atrophic vaginitis: Secondary | ICD-10-CM

## 2019-02-07 NOTE — Progress Notes (Signed)
GYNECOLOGY  VISIT   HPI: 79 y.o.   Widowed White or Caucasian Not Hispanic or Latino  female   [phone] with Patient's last menstrual period was 01/17/1982 (approximate).   here for  Follow up from Pessary irritation. She has been using a #5 ring pessary with support for a long time. Last month she was noted to have vaginal irritation and the pessary was left out for a few weeks. The pessary was reinserted a few weeks ago and she is here for f/u. After getting permission from her Oncologist she was also started on vaginal estrogen.   GYNECOLOGIC HISTORY: Patient's last menstrual period was 01/17/1982 (approximate). Contraception:Pmp Menopausal hormone therapy: estrace        OB History    Gravida  2   Para  2   Term  2   Preterm  0   AB  0   Living  2     SAB  0   TAB  0   Ectopic  0   Multiple  0   Live Births  2              Patient Active Problem List   Diagnosis Date Noted  . 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 Trochanteric bursitis of right hip 07/06/2017  . 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 Monoallelic mutation of NBN gene   . Genetic testing 01/13/2015  . Family history of breast cancer   . Family history of colon cancer   . Breast cancer of lower-inner quadrant of right female breast ([place]) 12/30/2014    Past Medical History:  Diagnosis Date  . Breast cancer ([place]) 11/2014  . Family history of breast cancer   . Family history of colon cancer   . History of kidney stones   . History of radiation therapy 08/11/15- 09/23/2015   Right Breast  . History of radiation therapy 11/13/17- 12/29/17   Right chest wall and IM nodes 50.04 Gy in 28 fractions, Right supraclavicular and PAB nodes 50.04 Gy in 28 fractions, Right Chest wall boost/ 10 Gy in 5 fractions.   . Hyperlipidemia   . Hypertension   . Monoallelic mutation of NBN gene   . Osteoporosis   . Skin cancer of nose     Past  Surgical History:  Procedure Laterality Date  . APPENDECTOMY    . BREAST LUMPECTOMY WITH RADIOACTIVE SEED AND SENTINEL LYMPH NODE BIOPSY Right 02/03/2015   Procedure: RIGHT BREAST LUMPECTOMY WITH RADIOACTIVE SEED AND RIGHT SENTINEL LYMPH NODE BIOPSY;  Surgeon: [name], MD;  Location: [place];  Service: General;  Laterality: Right;  . BREAST SURGERY  11/2012   biopsy, benign breast tissue  . COLONOSCOPY  06/2010   rec  . LITHOTRIPSY     kidney stone on left side  . MASTECTOMY W/ SENTINEL NODE BIOPSY Right 03/07/2017   Procedure: RIGHT TOTAL MASTECTOMY WITH RIGHT AXILLARY SENTINEL LYMPH NODE BIOPSY;  Surgeon: [name], MD;  Location: [place];  Service: General;  Laterality: Right;  . OVARIAN CYST REMOVAL  age 17  . POLYPECTOMY  04/2005   colon  . PORTACATH PLACEMENT Left 02/03/2015   Procedure: INSERTION PORT-A-CATH WITH ULTRA SOUND GUIDANCE;  Surgeon: [name], MD;  Location: [place];  Service: General;  Laterality: Left;  . reexcision  10/09/2017   Dr. Lucia Gaskins, chest wall excision for recurrent breast cancer.     Current Outpatient  Medications  Medication Sig Dispense Refill  . b complex vitamins tablet Take 1 tablet by mouth daily.    . Biotin 5000 MCG CAPS Take by mouth.    . calcium-vitamin D (OSCAL WITH D) 250-125 MG-UNIT tablet Take 1 tablet by mouth daily. Patient takes calcium 1200mg  plus vitamin D3 1,000 IU    . cholecalciferol (VITAMIN D) 1000 units tablet Take 2,000 Units by mouth daily.    . CRESTOR 20 MG tablet Take 1 tablet by mouth daily.  0  . cyanocobalamin 100 MCG tablet Take 100 mcg by mouth daily.    Marland Kitchen estradiol (ESTRACE) 0.1 MG/GM vaginal cream 1 gram vaginally qhs x 1 week, then twice weekly 42.5 g 1  . hydrochlorothiazide (MICROZIDE) 12.5 MG capsule Take 12.5 mg by mouth daily.     Marland Kitchen losartan (COZAAR) 100 MG tablet Take 100 mg by mouth daily.  0  . Multiple Vitamins-Minerals (EMERGEN-C IMMUNE PO) Take by mouth.    Levin Erp SULFATE VAGINAL (TRIMO-SAN) 0.025 % GEL Place 1 application vaginally once a week.     No current facility-administered medications for this visit.     ALLERGIES: Patient has no known allergies.  Family History  Problem Relation Age of Onset  . Colon cancer Sister 28       died at 31  . Breast cancer Mother 38  . Hypertension Mother   . Osteoporosis Mother   . Heart disease Father        CABG  . Dementia Father   . Throat cancer Brother 89  . Breast cancer Sister 48  . Lymphoma Sister 49  . Melanoma Sister   . Skin cancer Brother   . Skin cancer Daughter     Social History   Socioeconomic History  . Marital status: Widowed    Spouse name: Not on file  . Number of children: 2  . Years of education: Not on file  . Highest education level: Not on file  Occupational History  . Not on file  Tobacco Use  . Smoking status: Never Smoker  . Smokeless tobacco: Never Used  Substance and Sexual Activity  . Alcohol use: Yes    Comment: rarely  . Drug use: No  . Sexual activity: Not Currently    Partners: Male    Birth control/protection: Post-menopausal  Other Topics Concern  . Not on file  Social History Narrative  . Not on file   Social Determinants of Health   Financial Resource Strain:   . Difficulty of Paying Living Expenses: Not on file  Food Insecurity:   . Worried About Charity fundraiser in the Last Year: Not on file  . Ran Out of Food in the Last Year: Not on file  Transportation Needs:   . Lack of Transportation (Medical): Not asked  . Lack of Transportation (Non-Medical): Not asked  Physical Activity:   . Days of Exercise per Week: Not on file  . Minutes of Exercise per Session: Not on file  Stress:   . Feeling of Stress : Not on file  Social Connections:   . Frequency of Communication with Friends and Family: Not on file  . Frequency of Social Gatherings with Friends and Family: Not on file  . Attends Religious Services: Not on file   . Active Member of Clubs or Organizations: Not on file  . Attends Archivist Meetings: Not on file  . Marital Status: Not on file  Intimate Partner Violence: Unknown  .  Fear of Current or Ex-Partner: Not asked  . Emotionally Abused: Not asked  . Physically Abused: Not asked  . Sexually Abused: Not asked    Review of Systems  All other systems reviewed and are negative.   PHYSICAL EXAMINATION:    LMP 01/17/1982 (Approximate)     General appearance: alert, cooperative and appears stated age  Pelvic: External genitalia:  no lesions              Urethra:  normal appearing urethra with no masses, tenderness or lesions              Bartholins and Skenes: normal                 Vagina: the pessary was removed and cleaned. The vagina is better estrogenized, no vaginal irritation. The pessary was replaced              Cervix: no lesions  Chaperone was present for exam.  ASSESSMENT Pessary check, doing well Vaginal atrophy improved with vaginal estrogen    PLAN She will continue with the vaginal estrogen and f/u in 1 month

## 2019-02-14 ENCOUNTER — Ambulatory Visit: Payer: Medicare Other

## 2019-02-19 ENCOUNTER — Ambulatory Visit: Payer: Medicare Other

## 2019-02-22 ENCOUNTER — Ambulatory Visit: Payer: Medicare Other

## 2019-02-22 ENCOUNTER — Encounter: Payer: Self-pay | Admitting: Obstetrics and Gynecology

## 2019-02-22 DIAGNOSIS — Z1231 Encounter for screening mammogram for malignant neoplasm of breast: Secondary | ICD-10-CM | POA: Diagnosis not present

## 2019-02-23 ENCOUNTER — Ambulatory Visit: Payer: Medicare Other | Attending: Internal Medicine

## 2019-02-23 DIAGNOSIS — Z23 Encounter for immunization: Secondary | ICD-10-CM | POA: Insufficient documentation

## 2019-02-23 NOTE — Progress Notes (Signed)
   Covid-19 Vaccination Clinic  Name:  MADSION WORDLAW    MRN: [id number] DOB: 1940/11/18  02/23/2019  Ms. Krolak was observed post Covid-19 immunization for 15 minutes without incidence. She was provided with Vaccine Information Sheet and instruction to access the V-Safe system.   Ms. Gormly was instructed to call 911 with any severe reactions post vaccine: Marland Kitchen Difficulty breathing  . Swelling of your face and throat  . A fast heartbeat  . A bad rash all over your body  . Dizziness and weakness    Immunizations Administered    Name Date Dose VIS Date Route   Pfizer COVID-19 Vaccine 02/23/2019 11:03 AM 0.3 mL 12/28/2018 Intramuscular   Manufacturer: [place]   Lot: [id number]   [place]: [id number]

## 2019-03-06 ENCOUNTER — Telehealth: Payer: Self-pay | Admitting: Obstetrics and Gynecology

## 2019-03-06 ENCOUNTER — Other Ambulatory Visit: Payer: Self-pay

## 2019-03-06 NOTE — Telephone Encounter (Signed)
Left message for patient to call and reschedule her 4 week pessary check with Dr.Jertson. Dr.Jertson will be out of the office.

## 2019-03-11 ENCOUNTER — Ambulatory Visit: Payer: Medicare Other | Admitting: Obstetrics and Gynecology

## 2019-03-11 ENCOUNTER — Encounter: Payer: Self-pay | Admitting: Obstetrics and Gynecology

## 2019-03-11 ENCOUNTER — Ambulatory Visit (INDEPENDENT_AMBULATORY_CARE_PROVIDER_SITE_OTHER): Payer: Medicare Other | Admitting: Obstetrics and Gynecology

## 2019-03-11 ENCOUNTER — Other Ambulatory Visit: Payer: Self-pay

## 2019-03-11 VITALS — BP 126/74 | HR 77 | Temp 98.6°F | Ht 68.5 in | Wt 146.5 lb

## 2019-03-11 DIAGNOSIS — N814 Uterovaginal prolapse, unspecified: Secondary | ICD-10-CM

## 2019-03-11 DIAGNOSIS — T8389XA Other specified complication of genitourinary prosthetic devices, implants and grafts, initial encounter: Secondary | ICD-10-CM

## 2019-03-11 DIAGNOSIS — N898 Other specified noninflammatory disorders of vagina: Secondary | ICD-10-CM

## 2019-03-11 DIAGNOSIS — Z4689 Encounter for fitting and adjustment of other specified devices: Secondary | ICD-10-CM | POA: Diagnosis not present

## 2019-03-11 DIAGNOSIS — N8111 Cystocele, midline: Secondary | ICD-10-CM | POA: Diagnosis not present

## 2019-03-11 DIAGNOSIS — N899 Noninflammatory disorder of vagina, unspecified: Secondary | ICD-10-CM

## 2019-03-11 NOTE — Progress Notes (Signed)
GYNECOLOGY  VISIT   HPI: 79 y.o.   Widowed White or Caucasian Not Hispanic or Latino  female   [phone] with Patient's last menstrual period was 01/17/1982 (approximate).   here for 4 week pessary maintenance. Patient states that she is not having any issues and has been feeling good.    She had developed some vaginal irritation from her pessary at the end of last year. It improved with starting vaginal estrogen (Oncologist aware).  She is unable to remove the pessary on her own. She denies vaginal bleeding or abnormal vaginal discharge.    GYNECOLOGIC HISTORY: Patient's last menstrual period was 01/17/1982 (approximate). Contraception:NA Menopausal hormone therapy: estradiol cream         OB History    Gravida  2   Para  2   Term  2   Preterm  0   AB  0   Living  2     SAB  0   TAB  0   Ectopic  0   Multiple  0   Live Births  2              Patient Active Problem List   Diagnosis Date Noted  . 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 Trochanteric bursitis of right hip 07/06/2017  . 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 Monoallelic mutation of NBN gene   . Genetic testing 01/13/2015  . Family history of breast cancer   . Family history of colon cancer   . Breast cancer of lower-inner quadrant of right female breast ([place]) 12/30/2014    Past Medical History:  Diagnosis Date  . Breast cancer ([place]) 11/2014  . Family history of breast cancer   . Family history of colon cancer   . History of kidney stones   . History of radiation therapy 08/11/15- 09/23/2015   Right Breast  . History of radiation therapy 11/13/17- 12/29/17   Right chest wall and IM nodes 50.04 Gy in 28 fractions, Right supraclavicular and PAB nodes 50.04 Gy in 28 fractions, Right Chest wall boost/ 10 Gy in 5 fractions.   . Hyperlipidemia   . Hypertension   . Monoallelic mutation of NBN gene   . Osteoporosis   . Skin cancer  of nose     Past Surgical History:  Procedure Laterality Date  . APPENDECTOMY    . BREAST LUMPECTOMY WITH RADIOACTIVE SEED AND SENTINEL LYMPH NODE BIOPSY Right 02/03/2015   Procedure: RIGHT BREAST LUMPECTOMY WITH RADIOACTIVE SEED AND RIGHT SENTINEL LYMPH NODE BIOPSY;  Surgeon: [name], MD;  Location: [facility];  Service: General;  Laterality: Right;  . BREAST SURGERY  11/2012   biopsy, benign breast tissue  . COLONOSCOPY  06/2010   rec  . LITHOTRIPSY     kidney stone on left side  . MASTECTOMY W/ SENTINEL NODE BIOPSY Right 03/07/2017   Procedure: RIGHT TOTAL MASTECTOMY WITH RIGHT AXILLARY SENTINEL LYMPH NODE BIOPSY;  Surgeon: [name], MD;  Location: [place];  Service: General;  Laterality: Right;  . OVARIAN CYST REMOVAL  age 30  . POLYPECTOMY  04/2005   colon  . PORTACATH PLACEMENT Left 02/03/2015   Procedure: INSERTION PORT-A-CATH WITH ULTRA SOUND GUIDANCE;  Surgeon: [name], MD;  Location: [place];  Service: General;  Laterality: Left;  . reexcision  10/09/2017   Dr. Lucia Gaskins, chest wall excision for recurrent breast cancer.     Current Outpatient  Medications  Medication Sig Dispense Refill  . b complex vitamins tablet Take 1 tablet by mouth daily.    . Biotin 5000 MCG CAPS Take by mouth.    . calcium-vitamin D (OSCAL WITH D) 250-125 MG-UNIT tablet Take 1 tablet by mouth daily. Patient takes calcium 1200mg  plus vitamin D3 1,000 IU    . cholecalciferol (VITAMIN D) 1000 units tablet Take 2,000 Units by mouth daily.    . CRESTOR 20 MG tablet Take 1 tablet by mouth daily.  0  . cyanocobalamin 100 MCG tablet Take 100 mcg by mouth daily.    Marland Kitchen estradiol (ESTRACE) 0.1 MG/GM vaginal cream 1 gram vaginally qhs x 1 week, then twice weekly 42.5 g 1  . hydrochlorothiazide (MICROZIDE) 12.5 MG capsule Take 12.5 mg by mouth daily.     Marland Kitchen losartan (COZAAR) 100 MG tablet Take 100 mg by mouth daily.  0  . Multiple Vitamins-Minerals (EMERGEN-C IMMUNE PO) Take  by mouth.     No current facility-administered medications for this visit.     ALLERGIES: Patient has no known allergies.  Family History  Problem Relation Age of Onset  . Colon cancer Sister 45       died at 82  . Breast cancer Mother 62  . Hypertension Mother   . Osteoporosis Mother   . Heart disease Father        CABG  . Dementia Father   . Throat cancer Brother 18  . Breast cancer Sister 72  . Lymphoma Sister 58  . Melanoma Sister   . Skin cancer Brother   . Skin cancer Daughter     Social History   Socioeconomic History  . Marital status: Widowed    Spouse name: Not on file  . Number of children: 2  . Years of education: Not on file  . Highest education level: Not on file  Occupational History  . Not on file  Tobacco Use  . Smoking status: Never Smoker  . Smokeless tobacco: Never Used  Substance and Sexual Activity  . Alcohol use: Yes    Comment: rarely  . Drug use: No  . Sexual activity: Not Currently    Partners: Male    Birth control/protection: Post-menopausal  Other Topics Concern  . Not on file  Social History Narrative  . Not on file   Social Determinants of Health   Financial Resource Strain:   . Difficulty of Paying Living Expenses: Not on file  Food Insecurity:   . Worried About Charity fundraiser in the Last Year: Not on file  . Ran Out of Food in the Last Year: Not on file  Transportation Needs:   . Lack of Transportation (Medical): Not asked  . Lack of Transportation (Non-Medical): Not asked  Physical Activity:   . Days of Exercise per Week: Not on file  . Minutes of Exercise per Session: Not on file  Stress:   . Feeling of Stress : Not on file  Social Connections:   . Frequency of Communication with Friends and Family: Not on file  . Frequency of Social Gatherings with Friends and Family: Not on file  . Attends Religious Services: Not on file  . Active Member of Clubs or Organizations: Not on file  . Attends Theatre manager Meetings: Not on file  . Marital Status: Not on file  Intimate Partner Violence: Unknown  . Fear of Current or Ex-Partner: Not asked  . Emotionally Abused: Not asked  . Physically Abused:  Not asked  . Sexually Abused: Not asked    Review of Systems  All other systems reviewed and are negative.   PHYSICAL EXAMINATION:    BP 126/74   Pulse 77   Temp 98.6 F (37 C)   Ht 5' 8.5" (1.74 m)   Wt 146 lb 8 oz (66.5 kg)   LMP 01/17/1982 (Approximate)   SpO2 97%   BMI 21.95 kg/m     General appearance: alert, cooperative and appears stated age   Pelvic: External genitalia:  no lesions              Urethra:  normal appearing urethra with no masses, tenderness or lesions              Bartholins and Skenes: normal                 Vagina: the pessary was removed and cleaned. The vagina is better estrogenized. In the right vaginal apex is an ~ 1 cm area that appears mildly ulcerated. The pessary was left out, 1 gram of estrace cream was placed vaginally.               Cervix: no lesions                Chaperone was present for exam.  ASSESSMENT Repeated vaginal irritation with the ring pessary, even with vaginal estrogen.  H/O difficulty voiding without the pessary. Discussed reducing her cystocele to void. Call if having problems.    PLAN Pessary left out She will increase the estrogen cream to 1 gram 3 x a week F/U at the end of next week for a pessary fitting. She will bring both of her pessaries with her. I feel like she could potentially try the next size up in the ring pessary or try a cube pessary.    An After Visit Summary was printed and given to the patient.

## 2019-03-19 ENCOUNTER — Ambulatory Visit: Payer: Medicare Other | Attending: Internal Medicine

## 2019-03-19 DIAGNOSIS — Z23 Encounter for immunization: Secondary | ICD-10-CM

## 2019-03-19 NOTE — Progress Notes (Signed)
   Covid-19 Vaccination Clinic  Name:  [name]    MRN: [id number] DOB: Aug 06, 1940  03/19/2019  [name] was observed post Covid-19 immunization for 15 minutes without incident. She was provided with Vaccine Information Sheet and instruction to access the V-Safe system.   [name] was instructed to call 911 with any severe reactions post vaccine: Marland Kitchen Difficulty breathing  . Swelling of face and throat  . A fast heartbeat  . A bad rash all over body  . Dizziness and weakness   Immunizations Administered    Name Date Dose VIS Date Route   Pfizer COVID-19 Vaccine 03/19/2019  2:24 PM 0.3 mL 12/28/2018 Intramuscular   Manufacturer: [place]   Lot: [id number]   [place]: [id number]

## 2019-03-27 ENCOUNTER — Ambulatory Visit (INDEPENDENT_AMBULATORY_CARE_PROVIDER_SITE_OTHER): Payer: Medicare Other | Admitting: Obstetrics and Gynecology

## 2019-03-27 ENCOUNTER — Encounter: Payer: Self-pay | Admitting: Obstetrics and Gynecology

## 2019-03-27 ENCOUNTER — Other Ambulatory Visit: Payer: Self-pay

## 2019-03-27 VITALS — BP 122/72 | HR 58 | Temp 98.6°F | Wt 147.0 lb

## 2019-03-27 DIAGNOSIS — N8111 Cystocele, midline: Secondary | ICD-10-CM | POA: Diagnosis not present

## 2019-03-27 DIAGNOSIS — N952 Postmenopausal atrophic vaginitis: Secondary | ICD-10-CM

## 2019-03-27 DIAGNOSIS — N814 Uterovaginal prolapse, unspecified: Secondary | ICD-10-CM | POA: Diagnosis not present

## 2019-03-27 DIAGNOSIS — Z4689 Encounter for fitting and adjustment of other specified devices: Secondary | ICD-10-CM | POA: Diagnosis not present

## 2019-03-27 NOTE — Progress Notes (Signed)
GYNECOLOGY  VISIT   HPI: 79 y.o.   Widowed White or Caucasian Not Hispanic or Latino  female   [phone] with Patient's last menstrual period was 01/17/1982 (approximate).   here for pessary maintenance. The patient has developed some vaginal irritation from her pessary in the last few months. The irritation initially improved with starting vaginal estrogen, then worsened again. Her #5 ring pessary with support was left out at her visit on 03/11/19.  She has been doing okay without the pessary, starting to get more bothersome. No bleeding. She has been able to void.  She previously had a #4 ring pessary with support.   GYNECOLOGIC HISTORY: Patient's last menstrual period was 01/17/1982 (approximate). Contraception:none  Menopausal hormone therapy: estrace         OB History    Gravida  2   Para  2   Term  2   Preterm  0   AB  0   Living  2     SAB  0   TAB  0   Ectopic  0   Multiple  0   Live Births  2              Patient Active Problem List   Diagnosis Date Noted  . 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 Trochanteric bursitis of right hip 07/06/2017  . 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 Monoallelic mutation of NBN gene   . Genetic testing 01/13/2015  . Family history of breast cancer   . Family history of colon cancer   . Breast cancer of lower-inner quadrant of right female breast ([place]) 12/30/2014    Past Medical History:  Diagnosis Date  . Breast cancer ([place]) 11/2014  . Family history of breast cancer   . Family history of colon cancer   . History of kidney stones   . History of radiation therapy 08/11/15- 09/23/2015   Right Breast  . History of radiation therapy 11/13/17- 12/29/17   Right chest wall and IM nodes 50.04 Gy in 28 fractions, Right supraclavicular and PAB nodes 50.04 Gy in 28 fractions, Right Chest wall boost/ 10 Gy in 5 fractions.   . Hyperlipidemia   . Hypertension    . Monoallelic mutation of NBN gene   . Osteoporosis   . Skin cancer of nose     Past Surgical History:  Procedure Laterality Date  . APPENDECTOMY    . BREAST LUMPECTOMY WITH RADIOACTIVE SEED AND SENTINEL LYMPH NODE BIOPSY Right 02/03/2015   Procedure: RIGHT BREAST LUMPECTOMY WITH RADIOACTIVE SEED AND RIGHT SENTINEL LYMPH NODE BIOPSY;  Surgeon: [name], MD;  Location: [place];  Service: General;  Laterality: Right;  . BREAST SURGERY  11/2012   biopsy, benign breast tissue  . COLONOSCOPY  06/2010   rec  . LITHOTRIPSY     kidney stone on left side  . MASTECTOMY W/ SENTINEL NODE BIOPSY Right 03/07/2017   Procedure: RIGHT TOTAL MASTECTOMY WITH RIGHT AXILLARY SENTINEL LYMPH NODE BIOPSY;  Surgeon: [name], MD;  Location: [place];  Service: General;  Laterality: Right;  . OVARIAN CYST REMOVAL  age 89  . POLYPECTOMY  04/2005   colon  . PORTACATH PLACEMENT Left 02/03/2015   Procedure: INSERTION PORT-A-CATH WITH ULTRA SOUND GUIDANCE;  Surgeon: [name], MD;  Location: [place];  Service: General;  Laterality: Left;  . reexcision  10/09/2017   Dr. Lucia Gaskins, chest wall excision  for recurrent breast cancer.     Current Outpatient Medications  Medication Sig Dispense Refill  . b complex vitamins tablet Take 1 tablet by mouth daily.    . Biotin 5000 MCG CAPS Take by mouth.    . calcium-vitamin D (OSCAL WITH D) 250-125 MG-UNIT tablet Take 1 tablet by mouth daily. Patient takes calcium 1200mg  plus vitamin D3 1,000 IU    . cholecalciferol (VITAMIN D) 1000 units tablet Take 2,000 Units by mouth daily.    . CRESTOR 20 MG tablet Take 1 tablet by mouth daily.  0  . cyanocobalamin 100 MCG tablet Take 100 mcg by mouth daily.    Marland Kitchen estradiol (ESTRACE) 0.1 MG/GM vaginal cream 1 gram vaginally qhs x 1 week, then twice weekly 42.5 g 1  . hydrochlorothiazide (MICROZIDE) 12.5 MG capsule Take 12.5 mg by mouth daily.     Marland Kitchen losartan (COZAAR) 100 MG tablet Take 100 mg by  mouth daily.  0  . Multiple Vitamins-Minerals (EMERGEN-C IMMUNE PO) Take by mouth.     No current facility-administered medications for this visit.     ALLERGIES: Patient has no known allergies.  Family History  Problem Relation Age of Onset  . Colon cancer Sister 89       died at 75  . Breast cancer Mother 74  . Hypertension Mother   . Osteoporosis Mother   . Heart disease Father        CABG  . Dementia Father   . Throat cancer Brother 7  . Breast cancer Sister 42  . Lymphoma Sister 19  . Melanoma Sister   . Skin cancer Brother   . Skin cancer Daughter     Social History   Socioeconomic History  . Marital status: Widowed    Spouse name: Not on file  . Number of children: 2  . Years of education: Not on file  . Highest education level: Not on file  Occupational History  . Not on file  Tobacco Use  . Smoking status: Never Smoker  . Smokeless tobacco: Never Used  Substance and Sexual Activity  . Alcohol use: Yes    Comment: rarely  . Drug use: No  . Sexual activity: Not Currently    Partners: Male    Birth control/protection: Post-menopausal  Other Topics Concern  . Not on file  Social History Narrative  . Not on file   Social Determinants of Health   Financial Resource Strain:   . Difficulty of Paying Living Expenses: Not on file  Food Insecurity:   . Worried About Charity fundraiser in the Last Year: Not on file  . Ran Out of Food in the Last Year: Not on file  Transportation Needs:   . Lack of Transportation (Medical): Not asked  . Lack of Transportation (Non-Medical): Not asked  Physical Activity:   . Days of Exercise per Week: Not on file  . Minutes of Exercise per Session: Not on file  Stress:   . Feeling of Stress : Not on file  Social Connections:   . Frequency of Communication with Friends and Family: Not on file  . Frequency of Social Gatherings with Friends and Family: Not on file  . Attends Religious Services: Not on file  . Active  Member of Clubs or Organizations: Not on file  . Attends Archivist Meetings: Not on file  . Marital Status: Not on file  Intimate Partner Violence: Unknown  . Fear of Current or Ex-Partner: Not asked  .  Emotionally Abused: Not asked  . Physically Abused: Not asked  . Sexually Abused: Not asked    Review of Systems  All other systems reviewed and are negative.   PHYSICAL EXAMINATION:    LMP 01/17/1982 (Approximate)     General appearance: alert, cooperative and appears stated age   Pelvic: External genitalia:  no lesions              Urethra:  normal appearing urethra with no masses, tenderness or lesions              Bartholins and Skenes: normal                 Vagina: normal appearing vagina with normal color and discharge, no lesions. The prior area of irritation has healed.  Fitted with a #6 ring pessary, too large. Fitted with a #2 cube, felt comfortable, did move down in her vagina with big valsalva. Fitted with a #3 cube. Comfortable, didn't move with valsalva. Pessary removed.  Her #5 ring pessary with support was placed until the other pessary is ordered and arrives.               Cervix: no lesions               Chaperone was present for exam.  ASSESSMENT Genital prolapse, controlled with a #5 ring pessary with support, but recurrent vaginal irritation (even with addition of vaginal estrogen) Vaginal atrophy improved with vaginal estrogen    PLAN Fitted with a #3 cube pessary Pessary has been ordered. #5 ring pessary placed as a temporary measure until the cube pessary is here.  Space the vaginal estrogen to 1 gram 2 x a week.   An After Visit Summary was printed and given to the patient.

## 2019-04-04 DIAGNOSIS — D2262 Melanocytic nevi of left upper limb, including shoulder: Secondary | ICD-10-CM | POA: Diagnosis not present

## 2019-04-04 DIAGNOSIS — D224 Melanocytic nevi of scalp and neck: Secondary | ICD-10-CM | POA: Diagnosis not present

## 2019-04-04 DIAGNOSIS — Z85828 Personal history of other malignant neoplasm of skin: Secondary | ICD-10-CM | POA: Diagnosis not present

## 2019-04-04 DIAGNOSIS — D225 Melanocytic nevi of trunk: Secondary | ICD-10-CM | POA: Diagnosis not present

## 2019-04-05 ENCOUNTER — Telehealth: Payer: Self-pay

## 2019-04-05 NOTE — Telephone Encounter (Signed)
Left message to call [facility] at [phone].  Patient's pessary has arrived at the office. She will need an appointment with Dr.Jertson for insertion.

## 2019-04-05 NOTE — Telephone Encounter (Signed)
Spoke with patient. Patient is scheduled for pessary insertion on 04/09/2019 at 4:30 pm with Dr.Jertson. Patient is agreeable to date and time.  Routing to provider and will close encounter.

## 2019-04-09 ENCOUNTER — Encounter: Payer: Self-pay | Admitting: Obstetrics and Gynecology

## 2019-04-09 ENCOUNTER — Other Ambulatory Visit: Payer: Self-pay

## 2019-04-09 ENCOUNTER — Ambulatory Visit (INDEPENDENT_AMBULATORY_CARE_PROVIDER_SITE_OTHER): Payer: Medicare Other | Admitting: Obstetrics and Gynecology

## 2019-04-09 VITALS — BP 130/68 | HR 89 | Temp 97.0°F | Ht 68.5 in | Wt 148.0 lb

## 2019-04-09 DIAGNOSIS — N8111 Cystocele, midline: Secondary | ICD-10-CM

## 2019-04-09 DIAGNOSIS — N814 Uterovaginal prolapse, unspecified: Secondary | ICD-10-CM | POA: Diagnosis not present

## 2019-04-09 DIAGNOSIS — Z4689 Encounter for fitting and adjustment of other specified devices: Secondary | ICD-10-CM | POA: Diagnosis not present

## 2019-04-09 NOTE — Progress Notes (Signed)
GYNECOLOGY  VISIT   HPI: 79 y.o.   Widowed White or Caucasian Not Hispanic or Latino  female   [phone] with Patient's last menstrual period was 01/17/1982 (approximate).   here for pessary placement. She has genital prolapse that was controlled with a #5 ring pessary with support, but recurrent vaginal irritation even with the use of vaginal estrogen. Earlier this month she was fitted with a  #3 cube pessary and is here for insertion. She has been using the #5 ring pessary since her last visit.  GYNECOLOGIC HISTORY: Patient's last menstrual period was 01/17/1982 (approximate). Contraception:PMP Menopausal hormone therapy: estradiol         OB History    Gravida  2   Para  2   Term  2   Preterm  0   AB  0   Living  2     SAB  0   TAB  0   Ectopic  0   Multiple  0   Live Births  2              Patient Active Problem List   Diagnosis Date Noted  . 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 Trochanteric bursitis of right hip 07/06/2017  . 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 Monoallelic mutation of NBN gene   . Genetic testing 01/13/2015  . Family history of breast cancer   . Family history of colon cancer   . Breast cancer of lower-inner quadrant of right female breast ([place]) 12/30/2014    Past Medical History:  Diagnosis Date  . Breast cancer ([place]) 11/2014  . Family history of breast cancer   . Family history of colon cancer   . History of kidney stones   . History of radiation therapy 08/11/15- 09/23/2015   Right Breast  . History of radiation therapy 11/13/17- 12/29/17   Right chest wall and IM nodes 50.04 Gy in 28 fractions, Right supraclavicular and PAB nodes 50.04 Gy in 28 fractions, Right Chest wall boost/ 10 Gy in 5 fractions.   . Hyperlipidemia   . Hypertension   . Monoallelic mutation of NBN gene   . Osteoporosis   . Skin cancer of nose     Past Surgical History:  Procedure  Laterality Date  . APPENDECTOMY    . BREAST LUMPECTOMY WITH RADIOACTIVE SEED AND SENTINEL LYMPH NODE BIOPSY Right 02/03/2015   Procedure: RIGHT BREAST LUMPECTOMY WITH RADIOACTIVE SEED AND RIGHT SENTINEL LYMPH NODE BIOPSY;  Surgeon: [name], MD;  Location: [place];  Service: General;  Laterality: Right;  . BREAST SURGERY  11/2012   biopsy, benign breast tissue  . COLONOSCOPY  06/2010   rec  . LITHOTRIPSY     kidney stone on left side  . MASTECTOMY W/ SENTINEL NODE BIOPSY Right 03/07/2017   Procedure: RIGHT TOTAL MASTECTOMY WITH RIGHT AXILLARY SENTINEL LYMPH NODE BIOPSY;  Surgeon: [name], MD;  Location: [place];  Service: General;  Laterality: Right;  . OVARIAN CYST REMOVAL  age 31  . POLYPECTOMY  04/2005   colon  . PORTACATH PLACEMENT Left 02/03/2015   Procedure: INSERTION PORT-A-CATH WITH ULTRA SOUND GUIDANCE;  Surgeon: [name], MD;  Location: [place];  Service: General;  Laterality: Left;  . reexcision  10/09/2017   Dr. Lucia Gaskins, chest wall excision for recurrent breast cancer.     Current Outpatient Medications  Medication Sig Dispense Refill  . b complex  vitamins tablet Take 1 tablet by mouth daily.    . Biotin 5000 MCG CAPS Take by mouth.    . calcium-vitamin D (OSCAL WITH D) 250-125 MG-UNIT tablet Take 1 tablet by mouth daily. Patient takes calcium 1200mg  plus vitamin D3 1,000 IU    . cholecalciferol (VITAMIN D) 1000 units tablet Take 2,000 Units by mouth daily.    . CRESTOR 20 MG tablet Take 1 tablet by mouth daily.  0  . cyanocobalamin 100 MCG tablet Take 100 mcg by mouth daily.    Marland Kitchen estradiol (ESTRACE) 0.1 MG/GM vaginal cream 1 gram vaginally qhs x 1 week, then twice weekly 42.5 g 1  . hydrochlorothiazide (MICROZIDE) 12.5 MG capsule Take 12.5 mg by mouth daily.     Marland Kitchen losartan (COZAAR) 100 MG tablet Take 100 mg by mouth daily.  0  . Multiple Vitamins-Minerals (EMERGEN-C IMMUNE PO) Take by mouth.     No current facility-administered  medications for this visit.     ALLERGIES: Patient has no known allergies.  Family History  Problem Relation Age of Onset  . Colon cancer Sister 46       died at 20  . Breast cancer Mother 70  . Hypertension Mother   . Osteoporosis Mother   . Heart disease Father        CABG  . Dementia Father   . Throat cancer Brother 97  . Breast cancer Sister 58  . Lymphoma Sister 21  . Melanoma Sister   . Skin cancer Brother   . Skin cancer Daughter     Social History   Socioeconomic History  . Marital status: Widowed    Spouse name: Not on file  . Number of children: 2  . Years of education: Not on file  . Highest education level: Not on file  Occupational History  . Not on file  Tobacco Use  . Smoking status: Never Smoker  . Smokeless tobacco: Never Used  Substance and Sexual Activity  . Alcohol use: Yes    Comment: rarely  . Drug use: No  . Sexual activity: Not Currently    Partners: Male    Birth control/protection: Post-menopausal  Other Topics Concern  . Not on file  Social History Narrative  . Not on file   Social Determinants of Health   Financial Resource Strain:   . Difficulty of Paying Living Expenses:   Food Insecurity:   . Worried About Charity fundraiser in the Last Year:   . Arboriculturist in the Last Year:   Transportation Needs:   . Film/video editor (Medical):   Marland Kitchen Lack of Transportation (Non-Medical):   Physical Activity:   . Days of Exercise per Week:   . Minutes of Exercise per Session:   Stress:   . Feeling of Stress :   Social Connections:   . Frequency of Communication with Friends and Family:   . Frequency of Social Gatherings with Friends and Family:   . Attends Religious Services:   . Active Member of Clubs or Organizations:   . Attends Archivist Meetings:   Marland Kitchen Marital Status:   Intimate Partner Violence: Unknown  . Fear of Current or Ex-Partner: Not asked  . Emotionally Abused: Not asked  . Physically Abused: Not  asked  . Sexually Abused: Not asked    Review of Systems  All other systems reviewed and are negative.   PHYSICAL EXAMINATION:    BP 130/68   Pulse 89  Temp (!) 97 F (36.1 C)   Ht 5' 8.5" (1.74 m)   Wt 148 lb (67.1 kg)   LMP 01/17/1982 (Approximate)   SpO2 97%   BMI 22.18 kg/m     General appearance: alert, cooperative and appears stated age   Pelvic: External genitalia:  no lesions              Urethra:  normal appearing urethra with no masses, tenderness or lesions              Bartholins and Skenes: normal                 Vagina: the #5 pessary ring pessary was removed and cleaned. No vaginal irritation, well estrogenized. The #3 cube pessary was placed              Cervix: no lesions               Chaperone was present for exam.  ASSESSMENT Genital prolapse, well controlled with the #5 ring pessary but issues with recurrent irritation Vaginal atrophy, corrected with vaginal estrogen  PLAN #3 cube pessary placed Continue with 2 x a week estrogen cream F/U in one week Call with any concerns

## 2019-04-16 ENCOUNTER — Encounter: Payer: Self-pay | Admitting: Obstetrics and Gynecology

## 2019-04-16 ENCOUNTER — Ambulatory Visit (INDEPENDENT_AMBULATORY_CARE_PROVIDER_SITE_OTHER): Payer: Medicare Other | Admitting: Obstetrics and Gynecology

## 2019-04-16 ENCOUNTER — Other Ambulatory Visit: Payer: Self-pay

## 2019-04-16 VITALS — BP 112/74 | HR 78 | Temp 97.2°F | Ht 68.5 in | Wt 148.0 lb

## 2019-04-16 DIAGNOSIS — Z4689 Encounter for fitting and adjustment of other specified devices: Secondary | ICD-10-CM

## 2019-04-16 NOTE — Progress Notes (Signed)
GYNECOLOGY  VISIT   HPI: 79 y.o.   Widowed White or Caucasian Not Hispanic or Latino  female   [phone] with Patient's last menstrual period was 01/17/1982 (approximate).   here for pessary recheck. Patient states that she doesn't even know her pessary is there. She had a #3 cube pessary placed last week. Comfortable, doesn't feel any prolapse. No bowel or bladder c/o. She is using estrogen cream 2 x a week.   GYNECOLOGIC HISTORY: Patient's last menstrual period was 01/17/1982 (approximate). Contraception:none  Menopausal hormone therapy: estrace        OB History    Gravida  2   Para  2   Term  2   Preterm  0   AB  0   Living  2     SAB  0   TAB  0   Ectopic  0   Multiple  0   Live Births  2              Patient Active Problem List   Diagnosis Date Noted  . 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 Trochanteric bursitis of right hip 07/06/2017  . 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 Monoallelic mutation of NBN gene   . Genetic testing 01/13/2015  . Family history of breast cancer   . Family history of colon cancer   . Breast cancer of lower-inner quadrant of right female breast ([place]) 12/30/2014    Past Medical History:  Diagnosis Date  . Breast cancer ([place]) 11/2014  . Family history of breast cancer   . Family history of colon cancer   . History of kidney stones   . History of radiation therapy 08/11/15- 09/23/2015   Right Breast  . History of radiation therapy 11/13/17- 12/29/17   Right chest wall and IM nodes 50.04 Gy in 28 fractions, Right supraclavicular and PAB nodes 50.04 Gy in 28 fractions, Right Chest wall boost/ 10 Gy in 5 fractions.   . Hyperlipidemia   . Hypertension   . Monoallelic mutation of NBN gene   . Osteoporosis   . Skin cancer of nose     Past Surgical History:  Procedure Laterality Date  . APPENDECTOMY    . BREAST LUMPECTOMY WITH RADIOACTIVE SEED AND SENTINEL  LYMPH NODE BIOPSY Right 02/03/2015   Procedure: RIGHT BREAST LUMPECTOMY WITH RADIOACTIVE SEED AND RIGHT SENTINEL LYMPH NODE BIOPSY;  Surgeon: [name], MD;  Location: [place];  Service: General;  Laterality: Right;  . BREAST SURGERY  11/2012   biopsy, benign breast tissue  . COLONOSCOPY  06/2010   rec  . LITHOTRIPSY     kidney stone on left side  . MASTECTOMY W/ SENTINEL NODE BIOPSY Right 03/07/2017   Procedure: RIGHT TOTAL MASTECTOMY WITH RIGHT AXILLARY SENTINEL LYMPH NODE BIOPSY;  Surgeon: [name], MD;  Location: [place];  Service: General;  Laterality: Right;  . OVARIAN CYST REMOVAL  age 45  . POLYPECTOMY  04/2005   colon  . PORTACATH PLACEMENT Left 02/03/2015   Procedure: INSERTION PORT-A-CATH WITH ULTRA SOUND GUIDANCE;  Surgeon: [name], MD;  Location: [place];  Service: General;  Laterality: Left;  . reexcision  10/09/2017   Dr. Lucia Gaskins, chest wall excision for recurrent breast cancer.     Current Outpatient Medications  Medication Sig Dispense Refill  . b complex vitamins tablet Take 1 tablet by mouth daily.    . Biotin 5000  MCG CAPS Take by mouth.    . calcium-vitamin D (OSCAL WITH D) 250-125 MG-UNIT tablet Take 1 tablet by mouth daily. Patient takes calcium 1200mg  plus vitamin D3 1,000 IU    . cholecalciferol (VITAMIN D) 1000 units tablet Take 2,000 Units by mouth daily.    . CRESTOR 20 MG tablet Take 1 tablet by mouth daily.  0  . cyanocobalamin 100 MCG tablet Take 100 mcg by mouth daily.    Marland Kitchen estradiol (ESTRACE) 0.1 MG/GM vaginal cream 1 gram vaginally qhs x 1 week, then twice weekly 42.5 g 1  . hydrochlorothiazide (MICROZIDE) 12.5 MG capsule Take 12.5 mg by mouth daily.     Marland Kitchen losartan (COZAAR) 100 MG tablet Take 100 mg by mouth daily.  0  . Multiple Vitamins-Minerals (EMERGEN-C IMMUNE PO) Take by mouth.     No current facility-administered medications for this visit.     ALLERGIES: Patient has no known allergies.  Family  History  Problem Relation Age of Onset  . Colon cancer Sister 68       died at 54  . Breast cancer Mother 33  . Hypertension Mother   . Osteoporosis Mother   . Heart disease Father        CABG  . Dementia Father   . Throat cancer Brother 78  . Breast cancer Sister 66  . Lymphoma Sister 62  . Melanoma Sister   . Skin cancer Brother   . Skin cancer Daughter     Social History   Socioeconomic History  . Marital status: Widowed    Spouse name: Not on file  . Number of children: 2  . Years of education: Not on file  . Highest education level: Not on file  Occupational History  . Not on file  Tobacco Use  . Smoking status: Never Smoker  . Smokeless tobacco: Never Used  Substance and Sexual Activity  . Alcohol use: Yes    Comment: rarely  . Drug use: No  . Sexual activity: Not Currently    Partners: Male    Birth control/protection: Post-menopausal  Other Topics Concern  . Not on file  Social History Narrative  . Not on file   Social Determinants of Health   Financial Resource Strain:   . Difficulty of Paying Living Expenses:   Food Insecurity:   . Worried About Charity fundraiser in the Last Year:   . Arboriculturist in the Last Year:   Transportation Needs:   . Film/video editor (Medical):   Marland Kitchen Lack of Transportation (Non-Medical):   Physical Activity:   . Days of Exercise per Week:   . Minutes of Exercise per Session:   Stress:   . Feeling of Stress :   Social Connections:   . Frequency of Communication with Friends and Family:   . Frequency of Social Gatherings with Friends and Family:   . Attends Religious Services:   . Active Member of Clubs or Organizations:   . Attends Archivist Meetings:   Marland Kitchen Marital Status:   Intimate Partner Violence: Unknown  . Fear of Current or Ex-Partner: Not asked  . Emotionally Abused: Not asked  . Physically Abused: Not asked  . Sexually Abused: Not asked    Review of Systems  All other systems  reviewed and are negative.   PHYSICAL EXAMINATION:    LMP 01/17/1982 (Approximate)     General appearance: alert, cooperative and appears stated age  Pelvic: External genitalia:  no  lesions              Urethra:  normal appearing urethra with no masses, tenderness or lesions              Bartholins and Skenes: normal                 Vagina: the cube pessary was removed and cleaned. The vagina is healthy without areas of erythema, erosion or granulation tissue. The pessary was replaced.               Cervix: no lesions  Chaperone was present for exam.  ASSESSMENT Pessary check, doing well with the #3 cube pessary.     PLAN F/U in one month, continue 2 x a week vaginal estrogen. She will bring her estrogen with her to her next visit If her f/u exam is normal will have her up follow 3 months later with her annual exam

## 2019-04-22 DIAGNOSIS — M25561 Pain in right knee: Secondary | ICD-10-CM | POA: Diagnosis not present

## 2019-04-22 DIAGNOSIS — M1711 Unilateral primary osteoarthritis, right knee: Secondary | ICD-10-CM | POA: Diagnosis not present

## 2019-05-02 DIAGNOSIS — M810 Age-related osteoporosis without current pathological fracture: Secondary | ICD-10-CM | POA: Diagnosis not present

## 2019-05-17 ENCOUNTER — Ambulatory Visit: Payer: Medicare Other | Admitting: Podiatry

## 2019-05-17 ENCOUNTER — Other Ambulatory Visit: Payer: Self-pay

## 2019-05-17 ENCOUNTER — Encounter: Payer: Self-pay | Admitting: Podiatry

## 2019-05-17 VITALS — Temp 97.2°F

## 2019-05-17 DIAGNOSIS — L84 Corns and callosities: Secondary | ICD-10-CM | POA: Diagnosis not present

## 2019-05-17 DIAGNOSIS — T451X5A Adverse effect of antineoplastic and immunosuppressive drugs, initial encounter: Secondary | ICD-10-CM

## 2019-05-17 DIAGNOSIS — G620 Drug-induced polyneuropathy: Secondary | ICD-10-CM

## 2019-05-17 NOTE — Patient Instructions (Signed)

## 2019-05-17 NOTE — Progress Notes (Signed)
GYNECOLOGY  VISIT   HPI: 79 y.o.   Widowed White or Caucasian Not Hispanic or Latino  female   [phone] with Patient's last menstrual period was 01/17/1982 (approximate).   here for  One month pessary check, she has a #3 cube pessary. No c/o, no bleeding, no abnormal vaginal discharge.   She has questions about prolia. Her primary has suggested that she start on Prolia. She has a h/o osteoporosis, has been on fosamax, actonel and reclast in the past. She recently had a bone density, thinks she had osteopenia, but isn't sure.     GYNECOLOGIC HISTORY: Patient's last menstrual period was 01/17/1982 (approximate). Contraception: post-menopausal  Menopausal hormone therapy: estrace vaginal cream         OB History    Gravida  2   Para  2   Term  2   Preterm  0   AB  0   Living  2     SAB  0   TAB  0   Ectopic  0   Multiple  0   Live Births  2              Patient Active Problem List   Diagnosis Date Noted  . 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 Trochanteric bursitis of right hip 07/06/2017  . 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 Monoallelic mutation of NBN gene   . Genetic testing 01/13/2015  . Family history of breast cancer   . Family history of colon cancer   . Breast cancer of lower-inner quadrant of right female breast ([place]) 12/30/2014    Past Medical History:  Diagnosis Date  . Breast cancer ([place]) 11/2014  . Family history of breast cancer   . Family history of colon cancer   . History of kidney stones   . History of radiation therapy 08/11/15- 09/23/2015   Right Breast  . History of radiation therapy 11/13/17- 12/29/17   Right chest wall and IM nodes 50.04 Gy in 28 fractions, Right supraclavicular and PAB nodes 50.04 Gy in 28 fractions, Right Chest wall boost/ 10 Gy in 5 fractions.   . Hyperlipidemia   . Hypertension   . Monoallelic mutation of NBN gene   . Osteoporosis   . Skin  cancer of nose     Past Surgical History:  Procedure Laterality Date  . APPENDECTOMY    . BREAST LUMPECTOMY WITH RADIOACTIVE SEED AND SENTINEL LYMPH NODE BIOPSY Right 02/03/2015   Procedure: RIGHT BREAST LUMPECTOMY WITH RADIOACTIVE SEED AND RIGHT SENTINEL LYMPH NODE BIOPSY;  Surgeon: [name], MD;  Location: [place];  Service: General;  Laterality: Right;  . BREAST SURGERY  11/2012   biopsy, benign breast tissue  . COLONOSCOPY  06/2010   rec  . LITHOTRIPSY     kidney stone on left side  . MASTECTOMY W/ SENTINEL NODE BIOPSY Right 03/07/2017   Procedure: RIGHT TOTAL MASTECTOMY WITH RIGHT AXILLARY SENTINEL LYMPH NODE BIOPSY;  Surgeon: [name], MD;  Location: [place];  Service: General;  Laterality: Right;  . OVARIAN CYST REMOVAL  age 4  . POLYPECTOMY  04/2005   colon  . PORTACATH PLACEMENT Left 02/03/2015   Procedure: INSERTION PORT-A-CATH WITH ULTRA SOUND GUIDANCE;  Surgeon: [name], MD;  Location: [place];  Service: General;  Laterality: Left;  . reexcision  10/09/2017   Dr. Lucia Gaskins, chest wall excision for recurrent breast cancer.  Current Outpatient Medications  Medication Sig Dispense Refill  . b complex vitamins tablet Take 1 tablet by mouth daily.    . Biotin 5000 MCG CAPS Take by mouth.    . calcium-vitamin D (OSCAL WITH D) 250-125 MG-UNIT tablet Take 1 tablet by mouth daily. Patient takes calcium 1200mg  plus vitamin D3 1,000 IU    . cholecalciferol (VITAMIN D) 1000 units tablet Take 2,000 Units by mouth daily.    . CRESTOR 20 MG tablet Take 1 tablet by mouth daily.  0  . cyanocobalamin 100 MCG tablet Take 100 mcg by mouth daily.    Marland Kitchen estradiol (ESTRACE) 0.1 MG/GM vaginal cream 1 gram vaginally qhs x 1 week, then twice weekly 42.5 g 1  . hydrochlorothiazide (MICROZIDE) 12.5 MG capsule Take 12.5 mg by mouth daily.     Marland Kitchen losartan (COZAAR) 100 MG tablet Take 100 mg by mouth daily.  0  . Multiple Vitamins-Minerals (EMERGEN-C IMMUNE  PO) Take by mouth.     No current facility-administered medications for this visit.     ALLERGIES: Patient has no known allergies.  Family History  Problem Relation Age of Onset  . Colon cancer Sister 38       died at 71  . Breast cancer Mother 49  . Hypertension Mother   . Osteoporosis Mother   . Heart disease Father        CABG  . Dementia Father   . Throat cancer Brother 59  . Breast cancer Sister 90  . Lymphoma Sister 51  . Melanoma Sister   . Skin cancer Brother   . Skin cancer Daughter     Social History   Socioeconomic History  . Marital status: Widowed    Spouse name: Not on file  . Number of children: 2  . Years of education: Not on file  . Highest education level: Not on file  Occupational History  . Not on file  Tobacco Use  . Smoking status: Never Smoker  . Smokeless tobacco: Never Used  Substance and Sexual Activity  . Alcohol use: Yes    Comment: rarely  . Drug use: No  . Sexual activity: Not Currently    Partners: Male    Birth control/protection: Post-menopausal  Other Topics Concern  . Not on file  Social History Narrative  . Not on file   Social Determinants of Health   Financial Resource Strain:   . Difficulty of Paying Living Expenses:   Food Insecurity:   . Worried About Charity fundraiser in the Last Year:   . Arboriculturist in the Last Year:   Transportation Needs:   . Film/video editor (Medical):   Marland Kitchen Lack of Transportation (Non-Medical):   Physical Activity:   . Days of Exercise per Week:   . Minutes of Exercise per Session:   Stress:   . Feeling of Stress :   Social Connections:   . Frequency of Communication with Friends and Family:   . Frequency of Social Gatherings with Friends and Family:   . Attends Religious Services:   . Active Member of Clubs or Organizations:   . Attends Archivist Meetings:   Marland Kitchen Marital Status:   Intimate Partner Violence: Unknown  . Fear of Current or Ex-Partner: Not asked  .  Emotionally Abused: Not asked  . Physically Abused: Not asked  . Sexually Abused: Not asked    Review of Systems  Constitutional: Negative.   HENT: Negative.   Eyes:  Negative.   Respiratory: Negative.   Cardiovascular: Negative.   Gastrointestinal: Negative.   Genitourinary: Negative.   Musculoskeletal: Negative.   Skin: Negative.   Neurological: Negative.   Endo/Heme/Allergies: Negative.   Psychiatric/Behavioral: Negative.     PHYSICAL EXAMINATION:    BP 128/70 (BP Location: Left Arm, Patient Position: Sitting, Cuff Size: Normal)   Pulse 78   Temp 97.8 F (36.6 C) (Skin)   Resp 18   Ht 5' 8.5" (1.74 m)   Wt 147 lb 8 oz (66.9 kg)   LMP 01/17/1982 (Approximate)   BMI 22.10 kg/m     General appearance: alert, cooperative and appears stated age  Pelvic: External genitalia:  no lesions              Urethra:  normal appearing urethra with no masses, tenderness or lesions              Bartholins and Skenes: normal                 Vagina: normal appearing vagina with normal color and discharge, no lesions. The pessary was removed and cleaned. No vaginal irritation. 1 gram of estrace cream was placed vaginally and the pessary was replaced.               Cervix: no lesions               Chaperone was present for exam.  ASSESSMENT Pessary check, doing well Questions about prolia. Prior h/o osteoporosis and treatment with oral and IV biphosphonates. Not on anything for several years.    PLAN Continue with 2 x a week vaginal estrogen F/U in 3 months Discussed prolia, information given. Reviewed that once someone starts on prolia they need to stay on it indefinitely. Discussed the increased risk of vertebral fracture with stopping it. Discussed that sometimes women go off of and then back on biphosphonates She should be getting daily calcium, vit d and performing weight bearing exercise I recommended she discuss her options further with her primary (he has her DEXA report)

## 2019-05-20 ENCOUNTER — Ambulatory Visit: Payer: Medicare Other | Admitting: Obstetrics and Gynecology

## 2019-05-20 ENCOUNTER — Encounter: Payer: Self-pay | Admitting: Obstetrics and Gynecology

## 2019-05-20 ENCOUNTER — Other Ambulatory Visit: Payer: Self-pay

## 2019-05-20 VITALS — BP 128/70 | HR 78 | Temp 97.8°F | Resp 18 | Ht 68.5 in | Wt 147.5 lb

## 2019-05-20 DIAGNOSIS — Z8739 Personal history of other diseases of the musculoskeletal system and connective tissue: Secondary | ICD-10-CM

## 2019-05-20 DIAGNOSIS — Z4689 Encounter for fitting and adjustment of other specified devices: Secondary | ICD-10-CM | POA: Diagnosis not present

## 2019-05-20 NOTE — Patient Instructions (Signed)
Denosumab injection What is this medicine? DENOSUMAB (den oh sue mab) slows bone breakdown. Prolia is used to treat osteoporosis in women after menopause and in men, and in people who are taking corticosteroids for 6 months or more. Xgeva is used to treat a high calcium level due to cancer and to prevent bone fractures and other bone problems caused by multiple myeloma or cancer bone metastases. Xgeva is also used to treat giant cell tumor of the bone. This medicine may be used for other purposes; ask your health care provider or pharmacist if you have questions. COMMON BRAND NAME(S): Prolia, XGEVA What should I tell my health care provider before I take this medicine? They need to know if you have any of these conditions:  dental disease  having surgery or tooth extraction  infection  kidney disease  low levels of calcium or Vitamin D in the blood  malnutrition  on hemodialysis  skin conditions or sensitivity  thyroid or parathyroid disease  an unusual reaction to denosumab, other medicines, foods, dyes, or preservatives  pregnant or trying to get pregnant  breast-feeding How should I use this medicine? This medicine is for injection under the skin. It is given by a health care professional in a hospital or clinic setting. A special MedGuide will be given to you before each treatment. Be sure to read this information carefully each time. For Prolia, talk to your pediatrician regarding the use of this medicine in children. Special care may be needed. For Xgeva, talk to your pediatrician regarding the use of this medicine in children. While this drug may be prescribed for children as young as 13 years for selected conditions, precautions do apply. Overdosage: If you think you have taken too much of this medicine contact a poison control center or emergency room at once. NOTE: This medicine is only for you. Do not share this medicine with others. What if I miss a dose? It is  important not to miss your dose. Call your doctor or health care professional if you are unable to keep an appointment. What may interact with this medicine? Do not take this medicine with any of the following medications:  other medicines containing denosumab This medicine may also interact with the following medications:  medicines that lower your chance of fighting infection  steroid medicines like prednisone or cortisone This list may not describe all possible interactions. Give your health care provider a list of all the medicines, herbs, non-prescription drugs, or dietary supplements you use. Also tell them if you smoke, drink alcohol, or use illegal drugs. Some items may interact with your medicine. What should I watch for while using this medicine? Visit your doctor or health care professional for regular checks on your progress. Your doctor or health care professional may order blood tests and other tests to see how you are doing. Call your doctor or health care professional for advice if you get a fever, chills or sore throat, or other symptoms of a cold or flu. Do not treat yourself. This drug may decrease your body's ability to fight infection. Try to avoid being around people who are sick. You should make sure you get enough calcium and vitamin D while you are taking this medicine, unless your doctor tells you not to. Discuss the foods you eat and the vitamins you take with your health care professional. See your dentist regularly. Brush and floss your teeth as directed. Before you have any dental work done, tell your dentist you are   receiving this medicine. Do not become pregnant while taking this medicine or for 5 months after stopping it. Talk with your doctor or health care professional about your birth control options while taking this medicine. Women should inform their doctor if they wish to become pregnant or think they might be pregnant. There is a potential for serious side  effects to an unborn child. Talk to your health care professional or pharmacist for more information. What side effects may I notice from receiving this medicine? Side effects that you should report to your doctor or health care professional as soon as possible:  allergic reactions like skin rash, itching or hives, swelling of the face, lips, or tongue  bone pain  breathing problems  dizziness  jaw pain, especially after dental work  redness, blistering, peeling of the skin  signs and symptoms of infection like fever or chills; cough; sore throat; pain or trouble passing urine  signs of low calcium like fast heartbeat, muscle cramps or muscle pain; pain, tingling, numbness in the hands or feet; seizures  unusual bleeding or bruising  unusually weak or tired Side effects that usually do not require medical attention (report to your doctor or health care professional if they continue or are bothersome):  constipation  diarrhea  headache  joint pain  loss of appetite  muscle pain  runny nose  tiredness  upset stomach This list may not describe all possible side effects. Call your doctor for medical advice about side effects. You may report side effects to FDA at 1-800-FDA-1088. Where should I keep my medicine? This medicine is only given in a clinic, doctor's office, or other health care setting and will not be stored at home. NOTE: This sheet is a summary. It may not cover all possible information. If you have questions about this medicine, talk to your doctor, pharmacist, or health care provider.  2020 Elsevier/Gold Standard (2017-05-12 16:10:44)

## 2019-05-20 NOTE — Progress Notes (Signed)
Subjective: Shawna [name] presents today for follow up of follow up with h/o chemotherapy induced neuropathy and callus(es) left foot and painful mycotic toenails b/l that are difficult to trim. Pain interferes with ambulation. Aggravating factors include wearing enclosed shoe gear. Pain is relieved with periodic professional debridement.   She voices no new pedal concerns on today's visit.  No Known Allergies   Objective: Vitals:   05/17/19 0954  Temp: (!) 97.2 F (36.2 C)    Pt 79 y.o. year old female  in NAD. AAO x 3.   Vascular Examination:  Capillary refill time to digits immediate b/l. Palpable DP pulses b/l. Palpable PT pulses b/l. Pedal hair sparse b/l. Skin temperature gradient within normal limits b/l.  Dermatological Examination: Pedal skin with normal turgor, texture and tone bilaterally. No open wounds bilaterally. No interdigital macerations bilaterally. Toenails 1-5 b/l well maintained. No signs of infection noted. Hyperkeratotic lesion(s) submet head 1 left foot, submet head 5 left foot and plantar aspect left heel.  No erythema, no edema, no drainage, no flocculence.  Musculoskeletal: Normal muscle strength 5/5 to all lower extremity muscle groups bilaterally. No gross bony deformities bilaterally. No pain crepitus or joint limitation noted with ROM b/l.  Neurological: Protective sensation intact 5/5 intact bilaterally with 10g monofilament b/l. Vibratory sensation intact b/l.  Assessment: 1. Plantar callus   2. Chemotherapy-induced peripheral neuropathy (HCC)    Plan: -Callus(es) submet head 1 left foot, submet head 5 left foot and plantar aspect left heel pared utilizing sterile scalpel blade without complication or incident. Total number debrided =3. -Patient to continue soft, supportive shoe gear daily. -Patient to report any pedal injuries to medical professional immediately. -Patient/POA to call should there be question/concern in the interim.  Return  in about 1 year (around 05/16/2020) for callus trim.  Marzetta Board, DPM

## 2019-05-27 ENCOUNTER — Encounter (HOSPITAL_COMMUNITY): Payer: Medicare Other

## 2019-06-05 DIAGNOSIS — M1712 Unilateral primary osteoarthritis, left knee: Secondary | ICD-10-CM | POA: Diagnosis not present

## 2019-06-05 DIAGNOSIS — M170 Bilateral primary osteoarthritis of knee: Secondary | ICD-10-CM | POA: Diagnosis not present

## 2019-06-05 DIAGNOSIS — M1711 Unilateral primary osteoarthritis, right knee: Secondary | ICD-10-CM | POA: Diagnosis not present

## 2019-06-05 DIAGNOSIS — M25561 Pain in right knee: Secondary | ICD-10-CM | POA: Diagnosis not present

## 2019-06-11 DIAGNOSIS — C50911 Malignant neoplasm of unspecified site of right female breast: Secondary | ICD-10-CM | POA: Diagnosis not present

## 2019-06-19 ENCOUNTER — Ambulatory Visit: Payer: Medicare Other | Admitting: Obstetrics and Gynecology

## 2019-06-26 DIAGNOSIS — I10 Essential (primary) hypertension: Secondary | ICD-10-CM | POA: Diagnosis not present

## 2019-06-26 DIAGNOSIS — M810 Age-related osteoporosis without current pathological fracture: Secondary | ICD-10-CM | POA: Diagnosis not present

## 2019-06-26 DIAGNOSIS — E559 Vitamin D deficiency, unspecified: Secondary | ICD-10-CM | POA: Diagnosis not present

## 2019-07-05 DIAGNOSIS — H5203 Hypermetropia, bilateral: Secondary | ICD-10-CM | POA: Diagnosis not present

## 2019-07-19 ENCOUNTER — Other Ambulatory Visit (HOSPITAL_COMMUNITY): Payer: Self-pay

## 2019-07-23 ENCOUNTER — Ambulatory Visit (HOSPITAL_COMMUNITY)
Admission: RE | Admit: 2019-07-23 | Discharge: 2019-07-23 | Disposition: A | Discharge status: Home / Self Care | Payer: Medicare Other | Source: Ambulatory Visit | Attending: Internal Medicine | Admitting: Internal Medicine

## 2019-07-23 ENCOUNTER — Other Ambulatory Visit: Payer: Self-pay

## 2019-07-23 DIAGNOSIS — M810 Age-related osteoporosis without current pathological fracture: Secondary | ICD-10-CM | POA: Diagnosis not present

## 2019-07-23 MED ORDER — DENOSUMAB 60 MG/ML ~~LOC~~ SOSY
PREFILLED_SYRINGE | SUBCUTANEOUS | Status: AC
  Administered 2019-07-23: 60 mg via SUBCUTANEOUS
  Filled 2019-07-23: qty 1

## 2019-07-23 MED ORDER — DENOSUMAB 60 MG/ML ~~LOC~~ SOSY: 60.0000 mg | PREFILLED_SYRINGE | Freq: Once | SUBCUTANEOUS | Status: AC

## 2019-07-23 NOTE — Discharge Instructions (Signed)
Denosumab injection What is this medicine? DENOSUMAB (den oh sue mab) slows bone breakdown. Prolia is used to treat osteoporosis in women after menopause and in men, and in people who are taking corticosteroids for 6 months or more. Xgeva is used to treat a high calcium level due to cancer and to prevent bone fractures and other bone problems caused by multiple myeloma or cancer bone metastases. Xgeva is also used to treat giant cell tumor of the bone. This medicine may be used for other purposes; ask your health care provider or pharmacist if you have questions. COMMON BRAND NAME(S): Prolia, XGEVA What should I tell my health care provider before I take this medicine? They need to know if you have any of these conditions:  dental disease  having surgery or tooth extraction  infection  kidney disease  low levels of calcium or Vitamin D in the blood  malnutrition  on hemodialysis  skin conditions or sensitivity  thyroid or parathyroid disease  an unusual reaction to denosumab, other medicines, foods, dyes, or preservatives  pregnant or trying to get pregnant  breast-feeding How should I use this medicine? This medicine is for injection under the skin. It is given by a health care professional in a hospital or clinic setting. A special MedGuide will be given to you before each treatment. Be sure to read this information carefully each time. For Prolia, talk to your pediatrician regarding the use of this medicine in children. Special care may be needed. For Xgeva, talk to your pediatrician regarding the use of this medicine in children. While this drug may be prescribed for children as young as 13 years for selected conditions, precautions do apply. Overdosage: If you think you have taken too much of this medicine contact a poison control center or emergency room at once. NOTE: This medicine is only for you. Do not share this medicine with others. What if I miss a dose? It is  important not to miss your dose. Call your doctor or health care professional if you are unable to keep an appointment. What may interact with this medicine? Do not take this medicine with any of the following medications:  other medicines containing denosumab This medicine may also interact with the following medications:  medicines that lower your chance of fighting infection  steroid medicines like prednisone or cortisone This list may not describe all possible interactions. Give your health care provider a list of all the medicines, herbs, non-prescription drugs, or dietary supplements you use. Also tell them if you smoke, drink alcohol, or use illegal drugs. Some items may interact with your medicine. What should I watch for while using this medicine? Visit your doctor or health care professional for regular checks on your progress. Your doctor or health care professional may order blood tests and other tests to see how you are doing. Call your doctor or health care professional for advice if you get a fever, chills or sore throat, or other symptoms of a cold or flu. Do not treat yourself. This drug may decrease your body's ability to fight infection. Try to avoid being around people who are sick. You should make sure you get enough calcium and vitamin D while you are taking this medicine, unless your doctor tells you not to. Discuss the foods you eat and the vitamins you take with your health care professional. See your dentist regularly. Brush and floss your teeth as directed. Before you have any dental work done, tell your dentist you are   receiving this medicine. Do not become pregnant while taking this medicine or for 5 months after stopping it. Talk with your doctor or health care professional about your birth control options while taking this medicine. Women should inform their doctor if they wish to become pregnant or think they might be pregnant. There is a potential for serious side  effects to an unborn child. Talk to your health care professional or pharmacist for more information. What side effects may I notice from receiving this medicine? Side effects that you should report to your doctor or health care professional as soon as possible:  allergic reactions like skin rash, itching or hives, swelling of the face, lips, or tongue  bone pain  breathing problems  dizziness  jaw pain, especially after dental work  redness, blistering, peeling of the skin  signs and symptoms of infection like fever or chills; cough; sore throat; pain or trouble passing urine  signs of low calcium like fast heartbeat, muscle cramps or muscle pain; pain, tingling, numbness in the hands or feet; seizures  unusual bleeding or bruising  unusually weak or tired Side effects that usually do not require medical attention (report to your doctor or health care professional if they continue or are bothersome):  constipation  diarrhea  headache  joint pain  loss of appetite  muscle pain  runny nose  tiredness  upset stomach This list may not describe all possible side effects. Call your doctor for medical advice about side effects. You may report side effects to FDA at 1-800-FDA-1088. Where should I keep my medicine? This medicine is only given in a clinic, doctor's office, or other health care setting and will not be stored at home. NOTE: This sheet is a summary. It may not cover all possible information. If you have questions about this medicine, talk to your doctor, pharmacist, or health care provider.  2020 Elsevier/Gold Standard (2017-05-12 16:10:44)

## 2019-07-25 NOTE — Progress Notes (Signed)
Patient Care Team: [name], MD as PCP - General (Internal Medicine) [name], MD as Consulting Physician (General Surgery) [name], MD as Consulting Physician (Hematology and Oncology) Delice Bison, Charlestine Massed, NP as Nurse Practitioner (Hematology and Oncology)  DIAGNOSIS:    ICD-10-CM   1. 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 Genes tested include:  ATM, BARD1, BRCA1, BRCA2, BRIP1, CDH1, CHEK2, EPCAM, FANCC, MLH1, MSH2, MSH6, NBN, PALB2, PMS2, PTEN, RAD51C, RAD51D, TP53, and XRCC2.   02/03/2015 Surgery   Right lumpectomy: Invasive ductal carcinoma grade 2, 2.1 cm, with associated DCIS, margins are negative, 0/1 lymph node, ER 0%, PR 0%, HER-2 positive ratio 2.31, T2 N0 stage II a   03/09/2015 - 06/22/2015 Chemotherapy   Adjuvant chemotherapy with [place] 6 followed by Herceptin maintenance for 1 year   08/11/2015 - 09/23/2015 Radiation Therapy   Adjuvant radiation therapy [name]):  1) Right Breast / 50 Gy in 25 fractions ; 2) Right Breast Boost / 10 Gy in 5 fractions   01/30/2017 Relapse/Recurrence   Skin biopsy right breast: Metastatic breast cancer GCDFP strongly positive   02/15/2017 PET scan   Hypermetabolic focus of skin thickening along the medial right breast/chest wall consistent with recurrent disease, no other hypermetabolic metastases identified.   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 Mammogram on 02/22/19 showed no evidence of malignancy. She presents to the clinic today for follow-up.   ALLERGIES:  has No Known Allergies.  MEDICATIONS:  Current Outpatient Medications  Medication Sig Dispense Refill  . b complex vitamins tablet Take 1 tablet by mouth daily.    . Biotin 5000 MCG CAPS Take by mouth.    . calcium-vitamin D (OSCAL WITH D) 250-125 MG-UNIT tablet Take 1 tablet by mouth daily. Patient takes calcium '1200mg'$  plus vitamin D3 1,000 IU    . cholecalciferol (VITAMIN D) 1000 units tablet Take 2,000 Units by mouth daily.    . CRESTOR 20 MG tablet Take 1 tablet by mouth daily.  0  . cyanocobalamin 100 MCG tablet Take 100 mcg by mouth daily.    Marland Kitchen estradiol (ESTRACE) 0.1 MG/GM vaginal cream 1 gram vaginally qhs x 1 week, then twice weekly 42.5 g 1  . hydrochlorothiazide (MICROZIDE) 12.5 MG capsule Take 12.5 mg by mouth daily.     Marland Kitchen  losartan (COZAAR) 100 MG tablet Take 100 mg by mouth daily.  0  . Multiple Vitamins-Minerals (EMERGEN-C IMMUNE PO) Take by mouth.     No current facility-administered medications for this visit.    PHYSICAL EXAMINATION: ECOG PERFORMANCE STATUS: 1 - Symptomatic but completely ambulatory  There were no vitals filed for this visit. There were no vitals filed for this visit.  BREAST: No  palpable masses or nodules in either right or left breasts. No palpable axillary supraclavicular or infraclavicular adenopathy no breast tenderness or nipple discharge. (exam performed in the presence of a chaperone)  LABORATORY DATA:  I have reviewed the data as listed CMP Latest Ref Rng & Units 11/29/2017 03/07/2017 01/25/2016  Glucose 70 - 99 mg/dL 139(H) 82 79  BUN 8 - 23 mg/dL 22 26(H) 30.7(H)  Creatinine 0.44 - 1.00 mg/dL 1.00 0.94 1.0  Sodium 135 - 145 mmol/L 141 140 140  Potassium 3.5 - 5.1 mmol/L 3.3(L) 3.4(L) 3.8  Chloride 98 - 111 mmol/L 106 107 -  CO2 22 - 32 mmol/L '27 22 24  '$ Calcium 8.9 - 10.3 mg/dL 9.7 9.2 9.8  Total Protein 6.5 - 8.1 g/dL 6.9 - 6.5  Total Bilirubin 0.3 - 1.2 mg/dL 0.7 - 0.69  Alkaline Phos 38 - 126 U/L 89 - 113  AST 15 - 41 U/L 24 - 33  ALT 0 - 44 U/L 20 - 33    Lab Results  Component Value Date   WBC 3.6 (L) 11/29/2017   HGB 13.0 11/29/2017   HCT 37.9 11/29/2017   MCV 96.4 11/29/2017   PLT 186 11/29/2017   NEUTROABS 2.8 11/29/2017    ASSESSMENT & PLAN:  Breast cancer of lower-inner quadrant of right female breast ([place]) Right lumpectomy 02/03/2015: Invasive ductal carcinoma grade 2, 2.1 cm, with associated DCIS, margins are negative, 0/1 lymph node, ER 0%, PR 0%, HER-2 positiveratio 2.31, T2 N0 stage II a. (Right breast biopsy 3:30 position 12/09/2014: Invasive ductal carcinoma grade 3, ER 0%, PR 0%, HER-2 negativeratio 1.5, Ki-67 90%, 1 cm irregular mass T1C N0 stage IA clinical stage) Genetic counseling revealed NBN mutation  2. Adjuvant chemotherapy with [place] 6 cycles started 03/09/15 completed 06/22/15 tookHerceptin maintenance for 1 year until 02/15/2016 3. Followed by radiation therapy 08/11/2015 to09/05/2015 4. Cutaneous recurrence: 03/07/2017: Right mastectomy: IDC grade 3, 2 foci spanning 1.2 cm and 1.1 cm, lymphovascular invasion is present including dermal lymphatics, margins negative, 0/2 lymph nodes negative, ER 0%, PR 0%, HER-2  negative, Ki-67 not done,pT4b (skin involvement) N0 stage IIIc PET/CT 02/16/2017: No metastatic disease ----------------------------------------------------------------------------------------------- Patient decided not to receive any further systemic chemotherapy. There is no role of antiestrogen therapy because she is triple negative.  Chest wall Recurrence:  10/10/17: Chest wall excision: Breast cancer, ER 0%, PR 0%, HER-2 negative Adjuvant radiation therapy 11/14/2017-12/29/2017 Radiation pneumonitis on CT chest 03/05/2018  Breast cancer surveillance: 02/22/2019: Left breast mammogram: Benign, breast density category C 07/26/2019: Breast exam: Benign, right mastectomy scar tissue intact without any lumps or nodules  Return to clinic in 1 year for follow-up    No orders of the defined types were placed in this encounter.  The patient has a good understanding of the overall plan. she agrees with it. she will call with any problems that may develop before the next visit here.  Total time spent: 20 mins including face to face time and time spent for planning, charting and coordination of care  [name], MD 07/26/2019  I, Molly Dorshimer, am acting as  scribe for Dr. Jaquesha Boroff.  I have reviewed the above documentation for accuracy and completeness, and I agree with the above.       

## 2019-07-26 ENCOUNTER — Other Ambulatory Visit: Payer: Self-pay

## 2019-07-26 ENCOUNTER — Telehealth: Payer: Self-pay | Admitting: Hematology and Oncology

## 2019-07-26 ENCOUNTER — Inpatient Hospital Stay: Payer: Medicare Other | Attending: Hematology and Oncology | Admitting: Hematology and Oncology

## 2019-07-26 DIAGNOSIS — Z923 Personal history of irradiation: Secondary | ICD-10-CM | POA: Insufficient documentation

## 2019-07-26 DIAGNOSIS — Z79899 Other long term (current) drug therapy: Secondary | ICD-10-CM | POA: Diagnosis not present

## 2019-07-26 DIAGNOSIS — Z9011 Acquired absence of right breast and nipple: Secondary | ICD-10-CM | POA: Insufficient documentation

## 2019-07-26 DIAGNOSIS — C50311 Malignant neoplasm of lower-inner quadrant of right female breast: Secondary | ICD-10-CM | POA: Diagnosis not present

## 2019-07-26 DIAGNOSIS — Z9221 Personal history of antineoplastic chemotherapy: Secondary | ICD-10-CM | POA: Insufficient documentation

## 2019-07-26 DIAGNOSIS — Z171 Estrogen receptor negative status [ER-]: Secondary | ICD-10-CM | POA: Diagnosis not present

## 2019-07-26 NOTE — Assessment & Plan Note (Signed)
Right lumpectomy 02/03/2015: Invasive ductal carcinoma grade 2, 2.1 cm, with associated DCIS, margins are negative, 0/1 lymph node, ER 0%, PR 0%, HER-2 positiveratio 2.31, T2 N0 stage II a. (Right breast biopsy 3:30 position 12/09/2014: Invasive ductal carcinoma grade 3, ER 0%, PR 0%, HER-2 negativeratio 1.5, Ki-67 90%, 1 cm irregular mass T1C N0 stage IA clinical stage) Genetic counseling revealed NBN mutation  2. Adjuvant chemotherapy with [place] 6 cycles started 03/09/15 completed 06/22/15 tookHerceptin maintenance for 1 year until 02/15/2016 3. Followed by radiation therapy 08/11/2015 to09/05/2015 4. Cutaneous recurrence: 03/07/2017: Right mastectomy: IDC grade 3, 2 foci spanning 1.2 cm and 1.1 cm, lymphovascular invasion is present including dermal lymphatics, margins negative, 0/2 lymph nodes negative, ER 0%, PR 0%, HER-2 negative, Ki-67 not done,pT4b (skin involvement) N0 stage IIIc PET/CT 02/16/2017: No metastatic disease ----------------------------------------------------------------------------------------------- Patient decided not to receive any further systemic chemotherapy. There is no role of antiestrogen therapy because she is triple negative.  Chest wall Recurrence:  10/10/17: Chest wall excision: Breast cancer, ER 0%, PR 0%, HER-2 negative Adjuvant radiation therapy 11/14/2017-12/29/2017 Radiation pneumonitis on CT chest 03/05/2018  Breast cancer surveillance: 02/22/2019: Left breast mammogram: Benign, breast density category C 07/26/2019: Breast exam: Benign  Return to clinic in 1 year for follow-up

## 2019-07-26 NOTE — Telephone Encounter (Signed)
Scheduled per 7/9 los, gave pt AVS and calendar printouts

## 2019-08-07 DIAGNOSIS — M25561 Pain in right knee: Secondary | ICD-10-CM | POA: Diagnosis not present

## 2019-08-07 DIAGNOSIS — M1711 Unilateral primary osteoarthritis, right knee: Secondary | ICD-10-CM | POA: Diagnosis not present

## 2019-08-14 DIAGNOSIS — M1711 Unilateral primary osteoarthritis, right knee: Secondary | ICD-10-CM | POA: Diagnosis not present

## 2019-08-14 DIAGNOSIS — M25561 Pain in right knee: Secondary | ICD-10-CM | POA: Diagnosis not present

## 2019-08-21 DIAGNOSIS — M1711 Unilateral primary osteoarthritis, right knee: Secondary | ICD-10-CM | POA: Diagnosis not present

## 2019-08-22 ENCOUNTER — Other Ambulatory Visit: Payer: Self-pay

## 2019-08-22 ENCOUNTER — Encounter: Payer: Self-pay | Admitting: Obstetrics and Gynecology

## 2019-08-22 ENCOUNTER — Ambulatory Visit: Payer: Medicare Other | Admitting: Obstetrics and Gynecology

## 2019-08-22 VITALS — BP 122/78 | HR 80 | Ht 68.5 in | Wt 148.0 lb

## 2019-08-22 DIAGNOSIS — Z4689 Encounter for fitting and adjustment of other specified devices: Secondary | ICD-10-CM

## 2019-08-22 MED ORDER — ESTRADIOL 0.1 MG/GM VA CREA: TOPICAL_CREAM | VAGINAL | 1 refills | Status: DC

## 2019-08-22 NOTE — Progress Notes (Signed)
GYNECOLOGY  VISIT   HPI: 79 y.o.   Widowed White or Caucasian Not Hispanic or Latino  female   [phone] with Patient's last menstrual period was 01/17/1982 (approximate).   here for pessary maintenance . Patient states that her pharmacy told her they sent a refill request over twice for her estradiol and got no response. Therefore she has not been taking it. She has been out of estrogen x 3 weeks.  She was changed from a ring pessary to a cube pessary in 3/21. She feels good. She just started prolia. Still seeing the Oncologist and breast surgeon and getting breast checks there.   GYNECOLOGIC HISTORY: Patient's last menstrual period was 01/17/1982 (approximate). Contraception:none  Menopausal hormone therapy: estrace         OB History    Gravida  2   Para  2   Term  2   Preterm  0   AB  0   Living  2     SAB  0   TAB  0   Ectopic  0   Multiple  0   Live Births  2              Patient Active Problem List   Diagnosis Date Noted  . 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 Trochanteric bursitis of right hip 07/06/2017  . 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 Monoallelic mutation of NBN gene   . Genetic testing 01/13/2015  . Family history of breast cancer   . Family history of colon cancer   . Breast cancer of lower-inner quadrant of right female breast ([place]) 12/30/2014    Past Medical History:  Diagnosis Date  . Breast cancer ([place]) 11/2014  . Family history of breast cancer   . Family history of colon cancer   . History of kidney stones   . History of radiation therapy 08/11/15- 09/23/2015   Right Breast  . History of radiation therapy 11/13/17- 12/29/17   Right chest wall and IM nodes 50.04 Gy in 28 fractions, Right supraclavicular and PAB nodes 50.04 Gy in 28 fractions, Right Chest wall boost/ 10 Gy in 5 fractions.   . Hyperlipidemia   . Hypertension   . Monoallelic mutation of NBN gene    . Osteoporosis   . Skin cancer of nose     Past Surgical History:  Procedure Laterality Date  . APPENDECTOMY    . BREAST LUMPECTOMY WITH RADIOACTIVE SEED AND SENTINEL LYMPH NODE BIOPSY Right 02/03/2015   Procedure: RIGHT BREAST LUMPECTOMY WITH RADIOACTIVE SEED AND RIGHT SENTINEL LYMPH NODE BIOPSY;  Surgeon: [name], MD;  Location: [facility];  Service: General;  Laterality: Right;  . BREAST SURGERY  11/2012   biopsy, benign breast tissue  . COLONOSCOPY  06/2010   rec  . LITHOTRIPSY     kidney stone on left side  . MASTECTOMY W/ SENTINEL NODE BIOPSY Right 03/07/2017   Procedure: RIGHT TOTAL MASTECTOMY WITH RIGHT AXILLARY SENTINEL LYMPH NODE BIOPSY;  Surgeon: [name], MD;  Location: [facility];  Service: General;  Laterality: Right;  . OVARIAN CYST REMOVAL  age 69  . POLYPECTOMY  04/2005   colon  . PORTACATH PLACEMENT Left 02/03/2015   Procedure: INSERTION PORT-A-CATH WITH ULTRA SOUND GUIDANCE;  Surgeon: [name], MD;  Location: [facility];  Service: General;  Laterality: Left;  . reexcision  10/09/2017   Dr. Lucia Gaskins, chest wall excision for  recurrent breast cancer.     Current Outpatient Medications  Medication Sig Dispense Refill  . Biotin 5000 MCG CAPS Take by mouth.    . calcium-vitamin D (OSCAL WITH D) 250-125 MG-UNIT tablet Take 1 tablet by mouth daily. Patient takes calcium 1200mg  plus vitamin D3 1,000 IU    . cholecalciferol (VITAMIN D) 1000 units tablet Take 2,000 Units by mouth daily.    . CRESTOR 20 MG tablet Take 1 tablet by mouth daily.  0  . cyanocobalamin 100 MCG tablet Take 100 mcg by mouth daily.    Marland Kitchen denosumab (PROLIA) 60 MG/ML SOSY injection     . estradiol (ESTRACE) 0.1 MG/GM vaginal cream 1 gram vaginally qhs x 1 week, then twice weekly 42.5 g 1  . hydrochlorothiazide (MICROZIDE) 12.5 MG capsule Take 12.5 mg by mouth daily.     Marland Kitchen losartan (COZAAR) 100 MG tablet Take 100 mg by mouth daily.  0   No current  facility-administered medications for this visit.     ALLERGIES: Patient has no known allergies.  Family History  Problem Relation Age of Onset  . Colon cancer Sister 3       died at 51  . Breast cancer Mother 28  . Hypertension Mother   . Osteoporosis Mother   . Heart disease Father        CABG  . Dementia Father   . Throat cancer Brother 77  . Breast cancer Sister 58  . Lymphoma Sister 5  . Melanoma Sister   . Skin cancer Brother   . Skin cancer Daughter     Social History   Socioeconomic History  . Marital status: Widowed    Spouse name: Not on file  . Number of children: 2  . Years of education: Not on file  . Highest education level: Not on file  Occupational History  . Not on file  Tobacco Use  . Smoking status: Never Smoker  . Smokeless tobacco: Never Used  Vaping Use  . Vaping Use: Never used  Substance and Sexual Activity  . Alcohol use: Yes    Comment: rarely  . Drug use: No  . Sexual activity: Not Currently    Partners: Male    Birth control/protection: Post-menopausal  Other Topics Concern  . Not on file  Social History Narrative  . Not on file   Social Determinants of Health   Financial Resource Strain:   . Difficulty of Paying Living Expenses:   Food Insecurity:   . Worried About Charity fundraiser in the Last Year:   . Arboriculturist in the Last Year:   Transportation Needs:   . Film/video editor (Medical):   Marland Kitchen Lack of Transportation (Non-Medical):   Physical Activity:   . Days of Exercise per Week:   . Minutes of Exercise per Session:   Stress:   . Feeling of Stress :   Social Connections:   . Frequency of Communication with Friends and Family:   . Frequency of Social Gatherings with Friends and Family:   . Attends Religious Services:   . Active Member of Clubs or Organizations:   . Attends Archivist Meetings:   Marland Kitchen Marital Status:   Intimate Partner Violence: Unknown  . Fear of Current or Ex-Partner: Not asked   . Emotionally Abused: Not asked  . Physically Abused: Not asked  . Sexually Abused: Not asked    Review of Systems  All other systems reviewed and are negative.  PHYSICAL EXAMINATION:    BP 122/78   Pulse 80   Ht 5' 8.5" (1.74 m)   Wt 148 lb (67.1 kg)   LMP 01/17/1982 (Approximate)   SpO2 99%   BMI 22.18 kg/m     General appearance: alert, cooperative and appears stated age  Pelvic: External genitalia:  no lesions              Urethra:  normal appearing urethra with no masses, tenderness or lesions              Bartholins and Skenes: normal                 Vagina: the pessary was removed and cleaned, no vaginal irritation noted. Mild vaginal atrophy. Pessary replaced.              Cervix: no lesions  Chaperone was present for exam.  ASSESSMENT Pessary check, doing well Vaginal atrophy, she has been out of vaginal estrogen x 3 weeks    PLAN Continue with the #3 cube pessary She will try using the vaginal estrogen 1 x a week F/U in 3 months Call with any concerns

## 2019-09-10 DIAGNOSIS — H25013 Cortical age-related cataract, bilateral: Secondary | ICD-10-CM | POA: Diagnosis not present

## 2019-09-10 DIAGNOSIS — H25043 Posterior subcapsular polar age-related cataract, bilateral: Secondary | ICD-10-CM | POA: Diagnosis not present

## 2019-09-10 DIAGNOSIS — H2513 Age-related nuclear cataract, bilateral: Secondary | ICD-10-CM | POA: Diagnosis not present

## 2019-09-10 DIAGNOSIS — H18413 Arcus senilis, bilateral: Secondary | ICD-10-CM | POA: Diagnosis not present

## 2019-10-02 DIAGNOSIS — Z0120 Encounter for dental examination and cleaning without abnormal findings: Secondary | ICD-10-CM | POA: Diagnosis not present

## 2019-11-04 DIAGNOSIS — H2511 Age-related nuclear cataract, right eye: Secondary | ICD-10-CM | POA: Diagnosis not present

## 2019-11-05 DIAGNOSIS — H25012 Cortical age-related cataract, left eye: Secondary | ICD-10-CM | POA: Diagnosis not present

## 2019-11-05 DIAGNOSIS — H2512 Age-related nuclear cataract, left eye: Secondary | ICD-10-CM | POA: Diagnosis not present

## 2019-11-05 DIAGNOSIS — H25042 Posterior subcapsular polar age-related cataract, left eye: Secondary | ICD-10-CM | POA: Diagnosis not present

## 2019-11-11 DIAGNOSIS — I10 Essential (primary) hypertension: Secondary | ICD-10-CM | POA: Diagnosis not present

## 2019-11-11 DIAGNOSIS — E785 Hyperlipidemia, unspecified: Secondary | ICD-10-CM | POA: Diagnosis not present

## 2019-11-11 DIAGNOSIS — E559 Vitamin D deficiency, unspecified: Secondary | ICD-10-CM | POA: Diagnosis not present

## 2019-11-18 DIAGNOSIS — Z0000 Encounter for general adult medical examination without abnormal findings: Secondary | ICD-10-CM | POA: Diagnosis not present

## 2019-11-18 DIAGNOSIS — I700 Atherosclerosis of aorta: Secondary | ICD-10-CM | POA: Diagnosis not present

## 2019-11-18 DIAGNOSIS — E785 Hyperlipidemia, unspecified: Secondary | ICD-10-CM | POA: Diagnosis not present

## 2019-11-18 DIAGNOSIS — R82998 Other abnormal findings in urine: Secondary | ICD-10-CM | POA: Diagnosis not present

## 2019-11-18 DIAGNOSIS — I10 Essential (primary) hypertension: Secondary | ICD-10-CM | POA: Diagnosis not present

## 2019-11-21 ENCOUNTER — Ambulatory Visit: Payer: Medicare Other | Admitting: Obstetrics and Gynecology

## 2019-11-25 DIAGNOSIS — H2512 Age-related nuclear cataract, left eye: Secondary | ICD-10-CM | POA: Diagnosis not present

## 2019-11-27 ENCOUNTER — Other Ambulatory Visit: Payer: Self-pay

## 2019-11-27 ENCOUNTER — Ambulatory Visit: Payer: Medicare Other | Admitting: Obstetrics and Gynecology

## 2019-11-27 ENCOUNTER — Encounter: Payer: Self-pay | Admitting: Obstetrics and Gynecology

## 2019-11-27 VITALS — BP 120/70 | HR 77 | Ht 68.5 in | Wt 148.2 lb

## 2019-11-27 DIAGNOSIS — N814 Uterovaginal prolapse, unspecified: Secondary | ICD-10-CM

## 2019-11-27 DIAGNOSIS — N952 Postmenopausal atrophic vaginitis: Secondary | ICD-10-CM

## 2019-11-27 DIAGNOSIS — Z4689 Encounter for fitting and adjustment of other specified devices: Secondary | ICD-10-CM

## 2019-11-27 DIAGNOSIS — N3090 Cystitis, unspecified without hematuria: Secondary | ICD-10-CM | POA: Diagnosis not present

## 2019-11-27 DIAGNOSIS — N3941 Urge incontinence: Secondary | ICD-10-CM | POA: Diagnosis not present

## 2019-11-27 DIAGNOSIS — N8111 Cystocele, midline: Secondary | ICD-10-CM

## 2019-11-27 MED ORDER — SULFAMETHOXAZOLE-TRIMETHOPRIM 800-160 MG PO TABS: 1.0000 | ORAL_TABLET | Freq: Two times a day (BID) | ORAL | 0 refills | Status: DC

## 2019-11-27 NOTE — Patient Instructions (Signed)
Kegel Exercises  Kegel exercises can help strengthen your pelvic floor muscles. The pelvic floor is a group of muscles that support your rectum, small intestine, and bladder. In females, pelvic floor muscles also help support the womb (uterus). These muscles help you control the flow of urine and stool. Kegel exercises are painless and simple, and they do not require any equipment. Your provider may suggest Kegel exercises to:  Improve bladder and bowel control.  Improve sexual response.  Improve weak pelvic floor muscles after surgery to remove the uterus (hysterectomy) or pregnancy (females).  Improve weak pelvic floor muscles after prostate gland removal or surgery (males). Kegel exercises involve squeezing your pelvic floor muscles, which are the same muscles you squeeze when you try to stop the flow of urine or keep from passing gas. The exercises can be done while sitting, standing, or lying down, but it is best to vary your position. Exercises How to do Kegel exercises: 1. Squeeze your pelvic floor muscles tight. You should feel a tight lift in your rectal area. If you are a female, you should also feel a tightness in your vaginal area. Keep your stomach, buttocks, and legs relaxed. 2. Hold the muscles tight for up to 10 seconds. 3. Breathe normally. 4. Relax your muscles. 5. Repeat as told by your health care provider. Repeat this exercise daily as told by your health care provider. Continue to do this exercise for at least 4-6 weeks, or for as long as told by your health care provider. You may be referred to a physical therapist who can help you learn more about how to do Kegel exercises. Depending on your condition, your health care provider may recommend:  Varying how long you squeeze your muscles.  Doing several sets of exercises every day.  Doing exercises for several weeks.  Making Kegel exercises a part of your regular exercise routine. This information is not intended  to replace advice given to you by your health care provider. Make sure you discuss any questions you have with your health care provider. Document Revised: 08/23/2017 Document Reviewed: 08/23/2017 Elsevier Patient Education  [place]. Urinary Incontinence  Urinary incontinence refers to a condition in which a person is unable to control where and when to pass urine. A person with this condition will urinate when he or she does not mean to (involuntarily). What are the causes? This condition may be caused by:  Medicines.  Infections.  Constipation.  Overactive bladder muscles.  Weak bladder muscles.  Weak pelvic floor muscles. These muscles provide support for the bladder, intestine, and, in women, the uterus.  Enlarged prostate in men. The prostate is a gland near the bladder. When it gets too big, it can pinch the urethra. With the urethra blocked, the bladder can weaken and lose the ability to empty properly.  Surgery.  Emotional factors, such as anxiety, stress, or post-traumatic stress disorder (PTSD).  Pelvic organ prolapse. This happens in women when organs shift out of place and into the vagina. This shift can prevent the bladder and urethra from working properly. What increases the risk? The following factors may make you more likely to develop this condition:  Older age.  Obesity and physical inactivity.  Pregnancy and childbirth.  Menopause.  Diseases that affect the nerves or spinal cord (neurological diseases).  Long-term (chronic) coughing. This can increase pressure on the bladder and pelvic floor muscles. What are the signs or symptoms? Symptoms may vary depending on the type of urinary incontinence  you have. They include:  A sudden urge to urinate, but passing urine involuntarily before you can get to a bathroom (urge incontinence).  Suddenly passing urine with any activity that forces urine to pass, such as coughing, laughing, exercise, or  sneezing (stress incontinence).  Needing to urinate often, but urinating only a small amount, or constantly dribbling urine (overflow incontinence).  Urinating because you cannot get to the bathroom in time due to a physical disability, such as arthritis or injury, or communication and thinking problems, such as Alzheimer disease (functional incontinence). How is this diagnosed? This condition may be diagnosed based on:  Your medical history.  A physical exam.  Tests, such as: ? Urine tests. ? X-rays of your kidney and bladder. ? Ultrasound. ? CT scan. ? Cystoscopy. In this procedure, a health care provider inserts a tube with a light and camera (cystoscope) through the urethra and into the bladder in order to check for problems. ? Urodynamic testing. These tests assess how well the bladder, urethra, and sphincter can store and release urine. There are different types of urodynamic tests, and they vary depending on what the test is measuring. To help diagnose your condition, your health care provider may recommend that you keep a log of when you urinate and how much you urinate. How is this treated? Treatment for this condition depends on the type of incontinence that you have and its cause. Treatment may include:  Lifestyle changes, such as: ? Quitting smoking. ? Maintaining a healthy weight. ? Staying active. Try to get 150 minutes of moderate-intensity exercise every week. Ask your health care provider which activities are safe for you. ? Eating a healthy diet.  Avoid high-fat foods, like fried foods.  Avoid refined carbohydrates like white bread and white rice.  Limit how much alcohol and caffeine you drink.  Increase your fiber intake. Foods such as fresh fruits, vegetables, beans, and whole grains are healthy sources of fiber.  Pelvic floor muscle exercises.  Bladder training, such as lengthening the amount of time between bathroom breaks, or using the bathroom at regular  intervals.  Using techniques to suppress bladder urges. This can include distraction techniques or controlled breathing exercises.  Medicines to relax the bladder muscles and prevent bladder spasms.  Medicines to help slow or prevent the growth of a man's prostate.  Botox injections. These can help relax the bladder muscles.  Using pulses of electricity to help change bladder reflexes (electrical nerve stimulation).  For women, using a medical device to prevent urine leaks. This is a small, tampon-like, disposable device that is inserted into the urethra.  Injecting collagen or carbon beads (bulking agents) into the urinary sphincter. These can help thicken tissue and close the bladder opening.  Surgery. Follow these instructions at home: Lifestyle  Limit alcohol and caffeine. These can fill your bladder quickly and irritate it.  Keep yourself clean to help prevent odors and skin damage. Ask your doctor about special skin creams and cleansers that can protect the skin from urine.  Consider wearing pads or adult diapers. Make sure to change them regularly, and always change them right after experiencing incontinence. General instructions  Take over-the-counter and prescription medicines only as told by your health care provider.  Use the bathroom about every 3-4 hours, even if you do not feel the need to urinate. Try to empty your bladder completely every time. After urinating, wait a minute. Then try to urinate again.  Make sure you are in a relaxed position  while urinating.  If your incontinence is caused by nerve problems, keep a log of the medicines you take and the times you go to the bathroom.  Keep all follow-up visits as told by your health care provider. This is important. Contact a health care provider if:  You have pain that gets worse.  Your incontinence gets worse. Get help right away if:  You have a fever or chills.  You are unable to urinate.  You have  redness in your groin area or down your legs. Summary  Urinary incontinence refers to a condition in which a person is unable to control where and when to pass urine.  This condition may be caused by medicines, infection, weak bladder muscles, weak pelvic floor muscles, enlargement of the prostate (in men), or surgery.  The following factors increase your risk for developing this condition: older age, obesity, pregnancy and childbirth, menopause, neurological diseases, and chronic coughing.  There are several types of urinary incontinence. They include urge incontinence, stress incontinence, overflow incontinence, and functional incontinence.  This condition is usually treated first with lifestyle and behavioral changes, such as quitting smoking, eating a healthier diet, and doing regular pelvic floor exercises. Other treatment options include medicines, bulking agents, medical devices, electrical nerve stimulation, or surgery. This information is not intended to replace advice given to you by your health care provider. Make sure you discuss any questions you have with your health care provider. Document Revised: 01/13/2017 Document Reviewed: 04/14/2016 Elsevier Patient Education  [place]. Overactive Bladder, Adult  Overactive bladder refers to a condition in which a person has a sudden need to pass urine. The person may leak urine if he or she cannot get to the bathroom fast enough (urinary incontinence). A person with this condition may also wake up several times in the night to go to the bathroom. Overactive bladder is associated with poor nerve signals between your bladder and your brain. Your bladder may get the signal to empty before it is full. You may also have very sensitive muscles that make your bladder squeeze too soon. These symptoms might interfere with daily work or social activities. What are the causes? This condition may be associated with or caused by:  Urinary  tract infection.  Infection of nearby tissues, such as the prostate.  Prostate enlargement.  Surgery on the uterus or urethra.  Bladder stones, inflammation, or tumors.  Drinking too much caffeine or alcohol.  Certain medicines, especially medicines that get rid of extra fluid in the body (diuretics).  Muscle or nerve weakness, especially from: ? A spinal cord injury. ? Stroke. ? Multiple sclerosis. ? Parkinson's disease.  Diabetes.  Constipation. What increases the risk? You may be at greater risk for overactive bladder if you:  Are an older adult.  Smoke.  Are going through menopause.  Have prostate problems.  Have a neurological disease, such as stroke, dementia, Parkinson's disease, or multiple sclerosis (MS).  Eat or drink things that irritate the bladder. These include alcohol, spicy food, and caffeine.  Are overweight or obese. What are the signs or symptoms? Symptoms of this condition include:  Sudden, strong urge to urinate.  Leaking urine.  Urinating 8 or more times a day.  Waking up to urinate 2 or more times a night. How is this diagnosed? Your health care provider may suspect overactive bladder based on your symptoms. He or she will diagnose this condition by:  A physical exam and medical history.  Blood or urine tests.  You might need bladder or urine tests to help determine what is causing your overactive bladder. You might also need to see a health care provider who specializes in urinary tract problems (urologist). How is this treated? Treatment for overactive bladder depends on the cause of your condition and whether it is mild or severe. You can also make lifestyle changes at home. Options include:  Bladder training. This may include: ? Learning to control the urge to urinate by following a schedule that directs you to urinate at regular intervals (timed voiding). ? Doing Kegel exercises to strengthen your pelvic floor muscles, which  support your bladder. Toning these muscles can help you control urination, even if your bladder muscles are overactive.  Special devices. This may include: ? Biofeedback, which uses sensors to help you become aware of your body's signals. ? Electrical stimulation, which uses electrodes placed inside the body (implanted) or outside the body. These electrodes send gentle pulses of electricity to strengthen the nerves or muscles that control the bladder. ? Women may use a plastic device that fits into the vagina and supports the bladder (pessary).  Medicines. ? Antibiotics to treat bladder infection. ? Antispasmodics to stop the bladder from releasing urine at the wrong time. ? Tricyclic antidepressants to relax bladder muscles. ? Injections of botulinum toxin type A directly into the bladder tissue to relax bladder muscles.  Lifestyle changes. This may include: ? Weight loss. Talk to your health care provider about weight loss methods that would work best for you. ? Diet changes. This may include reducing how much alcohol and caffeine you consume, or drinking fluids at different times of the day. ? Not smoking. Do not use any products that contain nicotine or tobacco, such as cigarettes and e-cigarettes. If you need help quitting, ask your health care provider.  Surgery. ? A device may be implanted to help manage the nerve signals that control urination. ? An electrode may be implanted to stimulate electrical signals in the bladder. ? A procedure may be done to change the shape of the bladder. This is done only in very severe cases. Follow these instructions at home: Lifestyle  Make any diet or lifestyle changes that are recommended by your health care provider. These may include: ? Drinking less fluid or drinking fluids at different times of the day. ? Cutting down on caffeine or alcohol. ? Doing Kegel exercises. ? Losing weight if needed. ? Eating a healthy and balanced diet to prevent  constipation. This may include:  Eating foods that are high in fiber, such as fresh fruits and vegetables, whole grains, and beans.  Limiting foods that are high in fat and processed sugars, such as fried and sweet foods. General instructions  Take over-the-counter and prescription medicines only as told by your health care provider.  If you were prescribed an antibiotic medicine, take it as told by your health care provider. Do not stop taking the antibiotic even if you start to feel better.  Use any implants or pessary as told by your health care provider.  If needed, wear pads to absorb urine leakage.  Keep a journal or log to track how much and when you drink and when you feel the need to urinate. This will help your health care provider monitor your condition.  Keep all follow-up visits as told by your health care provider. This is important. Contact a health care provider if:  You have a fever.  Your symptoms do not get better with treatment.  Your pain and discomfort get worse.  You have more frequent urges to urinate. Get help right away if:  You are not able to control your bladder. Summary  Overactive bladder refers to a condition in which a person has a sudden need to pass urine.  Several conditions may lead to an overactive bladder.  Treatment for overactive bladder depends on the cause and severity of your condition.  Follow your health care provider's instructions about lifestyle changes, doing Kegel exercises, keeping a journal, and taking medicines. This information is not intended to replace advice given to you by your health care provider. Make sure you discuss any questions you have with your health care provider. Document Revised: 04/26/2018 Document Reviewed: 01/19/2017 Elsevier Patient Education  [place].

## 2019-11-27 NOTE — Progress Notes (Signed)
GYNECOLOGY  VISIT   HPI: 79 y.o.   Widowed White or Caucasian Not Hispanic or Latino  female   [phone] with Patient's last menstrual period was 01/17/1982 (approximate).   here for pessary follow up. She had a physical last week they called her and told her that she did not have a UTI but it came back positive urine culture.    She has a cube pessary, was put on vaginal estrogen secondary to vaginal irritation. She is using 1 x a week. Pessary comfortable, no vaginal bleeding.   She has a h/o urinary incontinence. She has urge incontinence, leaks at night in bed (leaks while she is sleeping). No GSI. Leakage has gotten much worse in the last year. Happens several times a day, voids frequently, urinary urgency. Leaks small to large amounts. Wears a pad. The leakage at night is mild.  She is on a diuretic.   H/O triple negative breast cancer.   GYNECOLOGIC HISTORY: Patient's last menstrual period was 01/17/1982 (approximate). Contraception: PMP  Menopausal hormone therapy: estrace         OB History    Gravida  2   Para  2   Term  2   Preterm  0   AB  0   Living  2     SAB  0   TAB  0   Ectopic  0   Multiple  0   Live Births  2              Patient Active Problem List   Diagnosis Date Noted  . 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 Trochanteric bursitis of right hip 07/06/2017  . 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 Monoallelic mutation of NBN gene   . Genetic testing 01/13/2015  . Family history of breast cancer   . Family history of colon cancer   . Breast cancer of lower-inner quadrant of right female breast ([place]) 12/30/2014    Past Medical History:  Diagnosis Date  . Breast cancer ([place]) 11/2014  . Family history of breast cancer   . Family history of colon cancer   . History of kidney stones   . History of radiation therapy 08/11/15- 09/23/2015   Right Breast  . History of  radiation therapy 11/13/17- 12/29/17   Right chest wall and IM nodes 50.04 Gy in 28 fractions, Right supraclavicular and PAB nodes 50.04 Gy in 28 fractions, Right Chest wall boost/ 10 Gy in 5 fractions.   . Hyperlipidemia   . Hypertension   . Monoallelic mutation of NBN gene   . Osteoporosis   . Skin cancer of nose     Past Surgical History:  Procedure Laterality Date  . APPENDECTOMY    . BREAST LUMPECTOMY WITH RADIOACTIVE SEED AND SENTINEL LYMPH NODE BIOPSY Right 02/03/2015   Procedure: RIGHT BREAST LUMPECTOMY WITH RADIOACTIVE SEED AND RIGHT SENTINEL LYMPH NODE BIOPSY;  Surgeon: [name], MD;  Location: [facility];  Service: General;  Laterality: Right;  . BREAST SURGERY  11/2012   biopsy, benign breast tissue  . COLONOSCOPY  06/2010   rec  . LITHOTRIPSY     kidney stone on left side  . MASTECTOMY W/ SENTINEL NODE BIOPSY Right 03/07/2017   Procedure: RIGHT TOTAL MASTECTOMY WITH RIGHT AXILLARY SENTINEL LYMPH NODE BIOPSY;  Surgeon: [name], MD;  Location: [facility];  Service: General;  Laterality: Right;  . OVARIAN CYST REMOVAL  age 7  . POLYPECTOMY  04/2005   colon  . PORTACATH PLACEMENT Left 02/03/2015   Procedure: INSERTION PORT-A-CATH WITH ULTRA SOUND GUIDANCE;  Surgeon: [name], MD;  Location: [facility];  Service: General;  Laterality: Left;  . reexcision  10/09/2017   Dr. Lucia Gaskins, chest wall excision for recurrent breast cancer.     Current Outpatient Medications  Medication Sig Dispense Refill  . Biotin 5000 MCG CAPS Take by mouth.     . calcium-vitamin D (OSCAL WITH D) 250-125 MG-UNIT tablet Take 1 tablet by mouth daily. Patient takes calcium 1200mg  plus vitamin D3 1,000 IU     . cholecalciferol (VITAMIN D) 1000 units tablet Take 2,000 Units by mouth daily.     . CRESTOR 20 MG tablet Take 1 tablet by mouth daily.   0  . cyanocobalamin 100 MCG tablet Take 100 mcg by mouth daily.     Marland Kitchen estradiol (ESTRACE) 0.1 MG/GM vaginal cream 1 gram  vaginally qhs x 1 week, then twice weekly 42.5 g 1  . hydrochlorothiazide (MICROZIDE) 12.5 MG capsule Take 12.5 mg by mouth daily.     Marland Kitchen losartan (COZAAR) 100 MG tablet Take 100 mg by mouth daily.   0   No current facility-administered medications for this visit.     ALLERGIES: Patient has no known allergies.  Family History  Problem Relation Age of Onset  . Colon cancer Sister 69       died at 29  . Breast cancer Mother 39  . Hypertension Mother   . Osteoporosis Mother   . Heart disease Father        CABG  . Dementia Father   . Throat cancer Brother 6  . Breast cancer Sister 64  . Lymphoma Sister 87  . Melanoma Sister   . Skin cancer Brother   . Skin cancer Daughter     Social History   Socioeconomic History  . Marital status: Widowed    Spouse name: Not on file  . Number of children: 2  . Years of education: Not on file  . Highest education level: Not on file  Occupational History  . Not on file  Tobacco Use  . Smoking status: Never Smoker  . Smokeless tobacco: Never Used  Vaping Use  . Vaping Use: Never used  Substance and Sexual Activity  . Alcohol use: Yes    Comment: rarely  . Drug use: No  . Sexual activity: Not Currently    Partners: Male    Birth control/protection: Post-menopausal  Other Topics Concern  . Not on file  Social History Narrative  . Not on file   Social Determinants of Health   Financial Resource Strain:   . Difficulty of Paying Living Expenses: Not on file  Food Insecurity:   . Worried About Charity fundraiser in the Last Year: Not on file  . Ran Out of Food in the Last Year: Not on file  Transportation Needs:   . Lack of Transportation (Medical): Not on file  . Lack of Transportation (Non-Medical): Not on file  Physical Activity:   . Days of Exercise per Week: Not on file  . Minutes of Exercise per Session: Not on file  Stress:   . Feeling of Stress : Not on file  Social Connections:   . Frequency of Communication with  Friends and Family: Not on file  . Frequency of Social Gatherings with Friends and Family: Not on file  . Attends Religious Services:  Not on file  . Active Member of Clubs or Organizations: Not on file  . Attends Archivist Meetings: Not on file  . Marital Status: Not on file  Intimate Partner Violence:   . Fear of Current or Ex-Partner: Not on file  . Emotionally Abused: Not on file  . Physically Abused: Not on file  . Sexually Abused: Not on file    ROS  PHYSICAL EXAMINATION:    BP 120/70   Pulse 77   Ht 5' 8.5" (1.74 m)   Wt 148 lb 3.2 oz (67.2 kg)   LMP 01/17/1982 (Approximate)   SpO2 99%   BMI 22.21 kg/m     General appearance: alert, cooperative and appears stated age  Pelvic: External genitalia:  no lesions              Urethra:  normal appearing urethra with no masses, tenderness or lesions              Bartholins and Skenes: normal                 Vagina:pessary removed and cleaned, no vaginal irritation. Pessary replaced.               Cervix: no lesions               Chaperone Shanon Mindi Junker was present for exam.  ASSESSMENT Recent + urine culture with her primary. Given her worsening incontinence I would recommend treatment. Having a copy of the report faxed over.  Genital prolapse, controlled with the pessary Vaginal atrophy, controlled with 1 x a week vaginal estrogen    PLAN Treat UTI Information on urinary incontinence, kegels and bladder training given If her leakage doesn't improve discussed option of PT and medication   Labs from primary received, + UTI, sensitive to Cefepine, cipro, bactrim and macrobid. Labs scanned. Bactrim called in.

## 2019-12-04 DIAGNOSIS — C50911 Malignant neoplasm of unspecified site of right female breast: Secondary | ICD-10-CM | POA: Diagnosis not present

## 2020-01-01 DIAGNOSIS — L989 Disorder of the skin and subcutaneous tissue, unspecified: Secondary | ICD-10-CM | POA: Diagnosis not present

## 2020-01-01 DIAGNOSIS — C50911 Malignant neoplasm of unspecified site of right female breast: Secondary | ICD-10-CM | POA: Diagnosis not present

## 2020-01-03 DIAGNOSIS — C50911 Malignant neoplasm of unspecified site of right female breast: Secondary | ICD-10-CM | POA: Diagnosis not present

## 2020-01-07 DIAGNOSIS — Z1212 Encounter for screening for malignant neoplasm of rectum: Secondary | ICD-10-CM | POA: Diagnosis not present

## 2020-01-22 ENCOUNTER — Ambulatory Visit: Payer: Medicare Other | Admitting: Podiatry

## 2020-01-22 ENCOUNTER — Other Ambulatory Visit: Payer: Self-pay

## 2020-01-22 ENCOUNTER — Encounter: Payer: Self-pay | Admitting: Podiatry

## 2020-01-22 DIAGNOSIS — G620 Drug-induced polyneuropathy: Secondary | ICD-10-CM | POA: Diagnosis not present

## 2020-01-22 DIAGNOSIS — T451X5A Adverse effect of antineoplastic and immunosuppressive drugs, initial encounter: Secondary | ICD-10-CM | POA: Diagnosis not present

## 2020-01-22 DIAGNOSIS — L84 Corns and callosities: Secondary | ICD-10-CM

## 2020-01-22 NOTE — Progress Notes (Signed)
Subjective:   Patient ID: Shawna [name], female   DOB: 80 y.o.   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

## 2020-02-24 ENCOUNTER — Encounter: Payer: Self-pay | Admitting: Obstetrics and Gynecology

## 2020-02-24 DIAGNOSIS — Z1231 Encounter for screening mammogram for malignant neoplasm of breast: Secondary | ICD-10-CM | POA: Diagnosis not present

## 2020-02-27 ENCOUNTER — Encounter: Payer: Self-pay | Admitting: Obstetrics and Gynecology

## 2020-02-27 ENCOUNTER — Ambulatory Visit: Payer: Medicare Other | Admitting: Obstetrics and Gynecology

## 2020-02-27 ENCOUNTER — Other Ambulatory Visit: Payer: Self-pay

## 2020-02-27 VITALS — BP 132/68 | HR 73 | Ht 68.5 in | Wt 150.8 lb

## 2020-02-27 DIAGNOSIS — N814 Uterovaginal prolapse, unspecified: Secondary | ICD-10-CM | POA: Diagnosis not present

## 2020-02-27 DIAGNOSIS — N3941 Urge incontinence: Secondary | ICD-10-CM

## 2020-02-27 DIAGNOSIS — N952 Postmenopausal atrophic vaginitis: Secondary | ICD-10-CM

## 2020-02-27 DIAGNOSIS — R151 Fecal smearing: Secondary | ICD-10-CM

## 2020-02-27 DIAGNOSIS — N8111 Cystocele, midline: Secondary | ICD-10-CM

## 2020-02-27 DIAGNOSIS — Z4689 Encounter for fitting and adjustment of other specified devices: Secondary | ICD-10-CM | POA: Diagnosis not present

## 2020-02-27 NOTE — Progress Notes (Signed)
GYNECOLOGY  VISIT   HPI: 80 y.o.   Widowed White or Caucasian Not Hispanic or Latino  female   [phone] with Patient's last menstrual period was 01/17/1982 (approximate).   here for  Pessary maintenance.  She has a cube pessary for a cystocele and uterine prolapse. She uses vaginal estrogen 1 x a week secondary to atrophy and irritation from the pessary. The pessary is working well, one time in the last 3 months she bulged in front of the pessary. She was able to push it up.   H/O urge incontinence. She is leaking 2-3 x a day, can be a moderate amount. Wears a pad when she is out. Her symptoms are much better if she doesn't take her diuretic.   Prior enuresis has resolved. Nocturia has improved, up 0-1 x a night.   She does c/o fecal smearing, no loss of formed stool. She has loose BM's.   H/O triple negative breast cancer  GYNECOLOGIC HISTORY: Patient's last menstrual period was 01/17/1982 (approximate). Contraception: PMP  Menopausal hormone therapy: yes, estrace        OB History    Gravida  2   Para  2   Term  2   Preterm  0   AB  0   Living  2     SAB  0   IAB  0   Ectopic  0   Multiple  0   Live Births  2              Patient Active Problem List   Diagnosis Date Noted  . 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 Trochanteric bursitis of right hip 07/06/2017  . 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 Monoallelic mutation of NBN gene   . Genetic testing 01/13/2015  . Family history of breast cancer   . Family history of colon cancer   . Breast cancer of lower-inner quadrant of right female breast ([place]) 12/30/2014    Past Medical History:  Diagnosis Date  . Breast cancer ([place]) 11/2014  . Family history of breast cancer   . Family history of colon cancer   . History of kidney stones   . History of radiation therapy 08/11/15- 09/23/2015   Right Breast  . History of radiation therapy  11/13/17- 12/29/17   Right chest wall and IM nodes 50.04 Gy in 28 fractions, Right supraclavicular and PAB nodes 50.04 Gy in 28 fractions, Right Chest wall boost/ 10 Gy in 5 fractions.   . Hyperlipidemia   . Hypertension   . Monoallelic mutation of NBN gene   . Osteoporosis   . Skin cancer of nose     Past Surgical History:  Procedure Laterality Date  . APPENDECTOMY    . BREAST LUMPECTOMY WITH RADIOACTIVE SEED AND SENTINEL LYMPH NODE BIOPSY Right 02/03/2015   Procedure: RIGHT BREAST LUMPECTOMY WITH RADIOACTIVE SEED AND RIGHT SENTINEL LYMPH NODE BIOPSY;  Surgeon: [name], MD;  Location: [facility];  Service: General;  Laterality: Right;  . BREAST SURGERY  11/2012   biopsy, benign breast tissue  . COLONOSCOPY  06/2010   rec  . LITHOTRIPSY     kidney stone on left side  . MASTECTOMY W/ SENTINEL NODE BIOPSY Right 03/07/2017   Procedure: RIGHT TOTAL MASTECTOMY WITH RIGHT AXILLARY SENTINEL LYMPH NODE BIOPSY;  Surgeon: [name], MD;  Location: [facility];  Service: General;  Laterality: Right;  . OVARIAN CYST REMOVAL  age 89  . POLYPECTOMY  04/2005   colon  . PORTACATH PLACEMENT Left 02/03/2015   Procedure: INSERTION PORT-A-CATH WITH ULTRA SOUND GUIDANCE;  Surgeon: [name], MD;  Location: [facility];  Service: General;  Laterality: Left;  . reexcision  10/09/2017   Dr. Lucia Gaskins, chest wall excision for recurrent breast cancer.     Current Outpatient Medications  Medication Sig Dispense Refill  . Biotin 5000 MCG CAPS Take by mouth.     . calcium-vitamin D (OSCAL WITH D) 250-125 MG-UNIT tablet Take 1 tablet by mouth daily. Patient takes calcium 1200mg  plus vitamin D3 1,000 IU     . cholecalciferol (VITAMIN D) 1000 units tablet Take 2,000 Units by mouth daily.     . CRESTOR 20 MG tablet Take 1 tablet by mouth daily.   0  . cyanocobalamin 100 MCG tablet Take 100 mcg by mouth daily.     Marland Kitchen estradiol (ESTRACE) 0.1 MG/GM vaginal cream 1 gram vaginally qhs x 1  week, then twice weekly 42.5 g 1  . hydrochlorothiazide (MICROZIDE) 12.5 MG capsule Take 12.5 mg by mouth daily.     Marland Kitchen losartan (COZAAR) 100 MG tablet Take 100 mg by mouth daily.   0   No current facility-administered medications for this visit.     ALLERGIES: Patient has no known allergies.  Family History  Problem Relation Age of Onset  . Colon cancer Sister 20       died at 40  . Breast cancer Mother 44  . Hypertension Mother   . Osteoporosis Mother   . Heart disease Father        CABG  . Dementia Father   . Throat cancer Brother 57  . Breast cancer Sister 65  . Lymphoma Sister 87  . Melanoma Sister   . Skin cancer Brother   . Skin cancer Daughter     Social History   Socioeconomic History  . Marital status: Widowed    Spouse name: Not on file  . Number of children: 2  . Years of education: Not on file  . Highest education level: Not on file  Occupational History  . Not on file  Tobacco Use  . Smoking status: Never Smoker  . Smokeless tobacco: Never Used  Vaping Use  . Vaping Use: Never used  Substance and Sexual Activity  . Alcohol use: Yes    Comment: rarely  . Drug use: No  . Sexual activity: Not Currently    Partners: Male    Birth control/protection: Post-menopausal  Other Topics Concern  . Not on file  Social History Narrative  . Not on file   Social Determinants of Health   Financial Resource Strain: Not on file  Food Insecurity: Not on file  Transportation Needs: Not on file  Physical Activity: Not on file  Stress: Not on file  Social Connections: Not on file  Intimate Partner Violence: Not on file    Review of Systems  All other systems reviewed and are negative.   PHYSICAL EXAMINATION:    BP 132/68   Pulse 73   Ht 5' 8.5" (1.74 m)   Wt 150 lb 12.8 oz (68.4 kg)   LMP 01/17/1982 (Approximate)   SpO2 98%   BMI 22.60 kg/m     General appearance: alert, cooperative and appears stated age   Pelvic: External genitalia:  no  lesions              Urethra:  normal appearing urethra with  no masses, tenderness or lesions              Bartholins and Skenes: normal                 Vagina: the pessary was removed and cleaned, no vaginal irritation was noted. 1 gram of estrace cream was placed. The pessary was replaced.               Cervix: no lesions              Chaperone was present for exam.  1. Pessary maintenance Doing well with the cube pessary  2. Midline cystocele Controlled with the pessary  3. Uterine prolapse Controlled with the pessary  4. Urge incontinence Seems mostly related to her diuretic, she will discuss the option of changing medication with her primary.  5. Fecal smearing Recommended she try metamucil. If she is still having issues would recommend PT.  6. Vaginal atrophy Continue with 1 x a week vaginal estrogen  Over 20 minutes in total patient care

## 2020-02-27 NOTE — Patient Instructions (Signed)
Try metamucil to bulk the stool.   Fecal Incontinence Fecal incontinence, also called accidental bowel leakage, is not being able to control your bowels. This condition happens because the nerves or muscles around the anus do not work the way they should. This affects their ability to hold stool (feces). What are the causes? This condition may be caused by:  Damage to the muscles at the end of the rectum (sphincter).  Damage to the nerves that control bowel movements.  Diarrhea.  Chronic constipation.  Pelvic floor dysfunction. This means the muscles in the pelvis do not work well.  Loss of bowel storage capacity. This occurs when the rectum can no longer stretch in size in order to store feces.  Inflammatory bowel disease (IBD), such as Crohn's disease.  Irritable bowel syndrome (IBS). What increases the risk? You are more likely to develop this condition if you:  Were born with bowels or a pelvis that did not form correctly.  Have had rectal surgery.  Have had radiation treatment for certain cancers.  Have been pregnant, had a vaginal delivery, or had surgery that damaged the pelvic floor muscles.  Had a complicated childbirth, spinal cord injury, or other trauma that caused nerve damage.  Have a condition that can affect nerve function, such as diabetes, Parkinson's disease, or multiple sclerosis.  Have a condition where the rectum drops down into the anus or vagina (prolapse).  Are 67 years of age or older. What are the signs or symptoms? The main symptom of this condition is not being able to control your bowels. You also might not be able to get to the bathroom before a bowel movement. How is this diagnosed? This condition is diagnosed with a medical history and physical exam. You may also have other tests, including:  Blood tests.  Urine tests.  A rectal exam.  Ultrasound.  MRI.  Colonoscopy. This is an exam that looks at your large intestine  (colon).  Anal manometry. This is a test that measures the strength of the anal sphincter.  Anal electromyogram (EMG). This is a test that uses small electrodes to check for nerve damage. How is this treated? Treatment for this condition depends on the cause and severity. Treatment may also focus on addressing any underlying causes of this condition. Treatment may include:  Medicines. This may include medicines to: ? Prevent diarrhea. ? Help with constipation (bulk-forming laxatives). ? Treat any underlying conditions.  Biofeedback therapy. This can help to retrain muscles that are affected.  Fiber supplements. These can help manage your bowel movements.  Nerve stimulation.  Injectable gel to promote tissue growth and better muscle control.  Surgery. You may need: ? Sphincter repair surgery. ? Diversion surgery. This procedure lets feces pass out of your body through a hole in your abdomen. Follow these instructions at home: Eating and drinking  Follow instructions from your health care provider about any eating or drinking restrictions. ? Work with a dietitian to come up with a healthy diet that will help you avoid the foods that can make your condition worse. ? Keep a diet diary to find out which foods or drinks could be making your condition worse.  Drink enough fluid to keep your urine pale yellow.   Lifestyle  Do not use any products that contain nicotine or tobacco, such as cigarettes and e-cigarettes. If you need help quitting, ask your health care provider. This may help your condition.  If you are overweight, talk with your health care provider  about how to safely lose weight. This may help your condition.  Increase your physical activity as told by your health care provider. This may help your condition. Always talk with your health care provider before starting a new exercise program.  Carry a change of clothes and supplies to clean up quickly if you have an episode  of fetal incontinence.  Consider joining a fecal incontinence support group. You can find a support group online or in your local community. General instructions  Take over-the-counter and prescription medicines only as told by your health care provider. This includes any supplements.  Apply a moisture barrier, such as petroleum jelly, to your rectum. This protects the skin from irritation caused by ongoing leaking or diarrhea.  Tell your health care provider if you are upset or depressed about your condition.  Keep all follow-up visits as told by your health care provider. This is important.   Where to find more information  International Foundation for Functional Gastrointestinal Disorders: iffgd.[place] [facility] Gastro[facility]: patients.gi.org Contact a health care provider if:  You have a fever.  You have redness, swelling, or pain around your rectum.  Your pain is getting worse or you lose feeling in your rectal area.  You have blood in your stool.  You feel sad or hopeless.  You avoid social or work situations. Get help right away if:  You stop having bowel movements.  You cannot eat or drink without vomiting.  You have rectal bleeding that does not stop.  You have severe pain that is getting worse.  You have symptoms of dehydration, including: ? Sleepiness or fatigue. ? Producing little or no urine, tears, or sweat. ? Dizziness. ? Dry mouth. ? Unusual irritability. ? Headache. ? Inability to think clearly. Summary  Fecal incontinence, also called accidental bowel leakage, is not being able to control your bowels. This condition happens because the nerves or muscles around the anus do not work the way they should.  Treatment varies depending on the cause and severity of your condition. Treatment may also focus on addressing any underlying causes of this condition.  Follow instructions from your health care provider about any eating or drinking  restrictions, lifestyle changes, and skin care.  Take over-the-counter and prescription medicines only as told by your health care provider. This includes any supplements.  Tell your health care provider if your symptoms worsen or if you are upset or depressed about your condition. This information is not intended to replace advice given to you by your health care provider. Make sure you discuss any questions you have with your health care provider. Document Revised: 05/18/2017 Document Reviewed: 05/18/2017 Elsevier Patient Education  2021 [place]. Overactive Bladder, Adult  Overactive bladder is a condition in which a person has a sudden and frequent need to urinate. A person might also leak urine if he or she cannot get to the bathroom fast enough (urinary incontinence). Sometimes, symptoms can interfere with work or social activities. What are the causes? Overactive bladder is associated with poor nerve signals between your bladder and your brain. Your bladder may get the signal to empty before it is full. You may also have very sensitive muscles that make your bladder squeeze too soon. This condition may also be caused by other factors, such as:  Medical conditions: ? Urinary tract infection. ? Infection of nearby tissues. ? Prostate enlargement. ? Bladder stones, inflammation, or tumors. ? Diabetes. ? Muscle or nerve weakness, especially from these conditions:  A spinal cord injury.  Stroke.  Multiple sclerosis.  Parkinson's disease.  Other causes: ? Surgery on the uterus or urethra. ? Drinking too much caffeine or alcohol. ? Certain medicines, especially those that eliminate extra fluid in the body (diuretics). ? Constipation. What increases the risk? You may be at greater risk for overactive bladder if you:  Are an older adult.  Smoke.  Are going through menopause.  Have prostate problems.  Have a neurological disease, such as stroke, dementia, Parkinson's  disease, or multiple sclerosis (MS).  Eat or drink alcohol, spicy food, caffeine, and other things that irritate the bladder.  Are overweight or obese. What are the signs or symptoms? Symptoms of this condition include a sudden, strong urge to urinate. Other symptoms include:  Leaking urine.  Urinating 8 or more times a day.  Waking up to urinate 2 or more times overnight. How is this diagnosed? This condition may be diagnosed based on:  Your symptoms and medical history.  A physical exam.  Blood or urine tests to check for possible causes, such as infection. You may also need to see a health care provider who specializes in urinary tract problems. This is called a urologist. How is this treated? Treatment for overactive bladder depends on the cause of your condition and whether it is mild or severe. Treatment may include:  Bladder training, such as: ? Learning to control the urge to urinate by following a schedule to urinate at regular intervals. ? Doing Kegel exercises to strengthen the pelvic floor muscles that support your bladder.  Special devices, such as: ? Biofeedback. This uses sensors to help you become aware of your body's signals. ? Electrical stimulation. This uses electrodes placed inside the body (implanted) or outside the body. These electrodes send gentle pulses of electricity to strengthen the nerves or muscles that control the bladder. ? Women may use a plastic device, called a pessary, that fits into the vagina and supports the bladder.  Medicines, such as: ? Antibiotics to treat bladder infection. ? Antispasmodics to stop the bladder from releasing urine at the wrong time. ? Tricyclic antidepressants to relax bladder muscles. ? Injections of botulinum toxin type A directly into the bladder tissue to relax bladder muscles.  Surgery, such as: ? A device may be implanted to help manage the nerve signals that control urination. ? An electrode may be  implanted to stimulate electrical signals in the bladder. ? A procedure may be done to change the shape of the bladder. This is done only in very severe cases. Follow these instructions at home: Eating and drinking  Make diet or lifestyle changes recommended by your health care provider. These may include: ? Drinking fluids throughout the day and not only with meals. ? Cutting down on caffeine or alcohol. ? Eating a healthy and balanced diet to prevent constipation. This may include:  Choosing foods that are high in fiber, such as beans, whole grains, and fresh fruits and vegetables.  Limiting foods that are high in fat and processed sugars, such as fried and sweet foods.   Lifestyle  Lose weight if needed.  Do not use any products that contain nicotine or tobacco. These include cigarettes, chewing tobacco, and vaping devices, such as e-cigarettes. If you need help quitting, ask your health care provider.   General instructions  Take over-the-counter and prescription medicines only as told by your health care provider.  If you were prescribed an antibiotic medicine, take it as told by your health  care provider. Do not stop taking the antibiotic even if you start to feel better.  Use any implants or pessary as told by your health care provider.  If needed, wear pads to absorb urine leakage.  Keep a log to track how much and when you drink, and when you need to urinate. This will help your health care provider monitor your condition.  Keep all follow-up visits. This is important. Contact a health care provider if:  You have a fever or chills.  Your symptoms do not get better with treatment.  Your pain and discomfort get worse.  You have more frequent urges to urinate. Get help right away if:  You are not able to control your bladder. Summary  Overactive bladder refers to a condition in which a person has a sudden and frequent need to urinate.  Several conditions may lead  to an overactive bladder.  Treatment for overactive bladder depends on the cause and severity of your condition.  Making lifestyle changes, doing Kegel exercises, keeping a log, and taking medicines can help with this condition. This information is not intended to replace advice given to you by your health care provider. Make sure you discuss any questions you have with your health care provider. Document Revised: 09/23/2019 Document Reviewed: 09/23/2019 Elsevier Patient Education  2021 [employer].

## 2020-04-20 DIAGNOSIS — Z85828 Personal history of other malignant neoplasm of skin: Secondary | ICD-10-CM | POA: Diagnosis not present

## 2020-04-20 DIAGNOSIS — D044 Carcinoma in situ of skin of scalp and neck: Secondary | ICD-10-CM | POA: Diagnosis not present

## 2020-04-20 DIAGNOSIS — D225 Melanocytic nevi of trunk: Secondary | ICD-10-CM | POA: Diagnosis not present

## 2020-04-20 DIAGNOSIS — D2261 Melanocytic nevi of right upper limb, including shoulder: Secondary | ICD-10-CM | POA: Diagnosis not present

## 2020-04-20 DIAGNOSIS — D2262 Melanocytic nevi of left upper limb, including shoulder: Secondary | ICD-10-CM | POA: Diagnosis not present

## 2020-05-08 ENCOUNTER — Other Ambulatory Visit: Payer: Self-pay | Admitting: *Deleted

## 2020-05-08 DIAGNOSIS — Z171 Estrogen receptor negative status [ER-]: Secondary | ICD-10-CM

## 2020-05-08 DIAGNOSIS — C50311 Malignant neoplasm of lower-inner quadrant of right female breast: Secondary | ICD-10-CM

## 2020-05-08 NOTE — Progress Notes (Signed)
Pt hx of right mastectomy with complaint of right arm swelling. Pt denies recent injury or trauma.  Pt also denies redness or warmth to the touch.  Per MD pt needing to be seen by physical therapy for evaluation and tx of lymphedema.  Referral placed and pt verbalized understanding,.

## 2020-05-12 ENCOUNTER — Telehealth: Payer: Self-pay | Admitting: Hematology and Oncology

## 2020-05-12 ENCOUNTER — Ambulatory Visit: Payer: Medicare Other | Attending: Hematology and Oncology | Admitting: Physical Therapy

## 2020-05-12 ENCOUNTER — Other Ambulatory Visit: Payer: Self-pay

## 2020-05-12 DIAGNOSIS — I972 Postmastectomy lymphedema syndrome: Secondary | ICD-10-CM | POA: Diagnosis not present

## 2020-05-12 DIAGNOSIS — M6281 Muscle weakness (generalized): Secondary | ICD-10-CM | POA: Diagnosis not present

## 2020-05-12 DIAGNOSIS — M25611 Stiffness of right shoulder, not elsewhere classified: Secondary | ICD-10-CM | POA: Insufficient documentation

## 2020-05-12 MED ORDER — AMOXICILLIN-POT CLAVULANATE 875-125 MG PO TABS: 1.0000 | ORAL_TABLET | Freq: Two times a day (BID) | ORAL | 0 refills | Status: DC

## 2020-05-12 NOTE — Therapy (Signed)
[place], [place], [address] Phone: [phone]   Fax:  [phone]  Physical Therapy Evaluation  Patient Details  Name: Shawna [name] MRN: [id number] Date of Birth: 07-10-1940 Referring Provider (PT): Dr. Lindi Adie   Encounter Date: 05/12/2020   PT End of Session - 05/12/20 1702    Visit Number 1    Number of Visits 9    Date for PT Re-Evaluation 06/11/20    PT Start Time 1600    PT Stop Time 1645    PT Time Calculation (min) 45 min    Activity Tolerance Patient tolerated treatment well    Behavior During Therapy [facility] for tasks assessed/performed           Past Medical History:  Diagnosis Date  . Breast cancer ([place]) 11/2014  . Family history of breast cancer   . Family history of colon cancer   . History of kidney stones   . History of radiation therapy 08/11/15- 09/23/2015   Right Breast  . History of radiation therapy 11/13/17- 12/29/17   Right chest wall and IM nodes 50.04 Gy in 28 fractions, Right supraclavicular and PAB nodes 50.04 Gy in 28 fractions, Right Chest wall boost/ 10 Gy in 5 fractions.   . Hyperlipidemia   . Hypertension   . Monoallelic mutation of NBN gene   . Osteoporosis   . Skin cancer of nose     Past Surgical History:  Procedure Laterality Date  . APPENDECTOMY    . BREAST LUMPECTOMY WITH RADIOACTIVE SEED AND SENTINEL LYMPH NODE BIOPSY Right 02/03/2015   Procedure: RIGHT BREAST LUMPECTOMY WITH RADIOACTIVE SEED AND RIGHT SENTINEL LYMPH NODE BIOPSY;  Surgeon: [name], MD;  Location: [place];  Service: General;  Laterality: Right;  . BREAST SURGERY  11/2012   biopsy, benign breast tissue  . COLONOSCOPY  06/2010   rec  . LITHOTRIPSY     kidney stone on left side  . MASTECTOMY W/ SENTINEL NODE BIOPSY Right 03/07/2017   Procedure: RIGHT TOTAL MASTECTOMY WITH RIGHT AXILLARY SENTINEL LYMPH NODE BIOPSY;  Surgeon: [name], MD;  Location: [place];   Service: General;  Laterality: Right;  . OVARIAN CYST REMOVAL  age 30  . POLYPECTOMY  04/2005   colon  . PORTACATH PLACEMENT Left 02/03/2015   Procedure: INSERTION PORT-A-CATH WITH ULTRA SOUND GUIDANCE;  Surgeon: [name], MD;  Location: [place];  Service: General;  Laterality: Left;  . reexcision  10/09/2017   Dr. Lucia Gaskins, chest wall excision for recurrent breast cancer.     There were no vitals filed for this visit.    Subjective Assessment - 05/12/20 1556    Subjective Pt said she had sudden onset of nonpainful swelling in her her right lower arm just above the wrist around [address]. The swelling moves around some is worse in the morning and gets better throuout the day and is now down into her hand. 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  Past history includes bilateral total hip replacements, hyperlipidemia, hypertension and osteoporosis    Patient Stated Goals to get rid of  the swelling    Currently in Pain? No/denies   pt says right anterior lateral chest above mastectomy scar is tender when touched             [facility] PT Assessment - 05/12/20 0001      Assessment   Medical Diagnosis right breast cancer    Referring Provider (PT) Dr. Lindi Adie    Onset Date/Surgical Date 10/18/14    Hand Dominance Right      Precautions   Precautions Other (comment)    Precaution Comments at risk for lymphedema      Restrictions   Weight Bearing Restrictions No      Balance Screen   Has the patient fallen in the past 6 months No    Has the patient had a decrease in activity level  because of a fear of falling?  No    Is the patient reluctant to leave their home because of a fear of falling?  No      Home Social worker Private residence    Living Arrangements Children   son   Available Help at Discharge Available PRN/intermittently      Prior Function   Level of Independence Independent    Vocation Retired    Leisure likes to walk almost a mile almost every day      Cognition   Overall Cognitive Status Within Functional Limits for tasks assessed      Observation/Other Assessments   Observations pt with visible swelling in her right arm, especially forearm , and pink fullness above her mastecomy site that she says is tender and warm to touch and a bit bigger than it has been  Mastecomy site is tight against chest wall      Coordination   Gross Motor Movements are Fluid and Coordinated Yes      Posture/Postural Control   Posture/Postural Control Postural limitations    Postural Limitations Forward head;Rounded Shoulders;Increased thoracic kyphosis      ROM / Strength   AROM / PROM / Strength AROM;Strength      AROM   Overall AROM  Within functional limits for tasks performed    AROM Assessment Site Shoulder    Right/Left Shoulder Right;Left    Right Shoulder Flexion 125 Degrees    Right Shoulder ABduction 120 Degrees    Left Shoulder Flexion 155 Degrees    Left Shoulder ABduction 160 Degrees      Strength   Overall Strength Comments pt appears to have diffuse muscle atrophy    Strength Assessment Site Shoulder    Right/Left Shoulder Right    Right Shoulder Flexion 3-/5    Right Shoulder Extension 3-/5      Palpation   Palpation comment very tight mastecomy site with little skin excursion             LYMPHEDEMA/ONCOLOGY QUESTIONNAIRE - 05/12/20 0001      Type   Cancer Type breast      Surgeries   Mastectomy Date 03/07/17    Sentinel Lymph Node Biopsy Date 03/07/17      Treatment   Past Chemotherapy Treatment Yes     Past Radiation Treatment Yes      What other symptoms do you have   Are you Having Heaviness or Tightness Yes    Are you having Pain No    Are you having pitting edema Yes    Body Site right forearm    Is it Hard or Difficult finding clothes  that fit No    Do you have infections No    Is there Decreased scar mobility Yes    [place]mer Sign No      Lymphedema Assessments   Lymphedema Assessments Upper extremities      Right Upper Extremity Lymphedema   10 cm Proximal to Olecranon Process 28.5 cm    Olecranon Process 27 cm    15 cm Proximal to Ulnar Styloid Process 25 cm    10 cm Proximal to Ulnar Styloid Process 21.7 cm    Just Proximal to Ulnar Styloid Process 17 cm    Across Hand at [employer] 18.6 cm    At [place] of 2nd Digit 5.8 cm      Left Upper Extremity Lymphedema   15 cm Proximal to Olecranon Process 26.3 cm    10 cm Proximal to Olecranon Process 25.5 cm    Olecranon Process 23.3 cm    15 cm Proximal to Ulnar Styloid Process 21.3 cm    10 cm Proximal to Ulnar Styloid Process 18.3 cm    Just Proximal to Ulnar Styloid Process 14.7 cm    Across Hand at [employer] 17.5 cm    At [place] of 2nd Digit 5.5 cm                 Quick Dash - 05/12/20 0001    Open a tight or new jar Severe difficulty    Do heavy household chores (wash walls, wash floors) Mild difficulty    Carry a shopping bag or briefcase No difficulty    Wash your back No difficulty    Use a knife to cut food No difficulty    Recreational activities in which you take some force or impact through your arm, shoulder, or hand (golf, hammering, tennis) No difficulty    During the past week, to what extent has your arm, shoulder or hand problem interfered with your normal social activities with family, friends, neighbors, or groups? Slightly    During the past week, to what extent has your arm, shoulder or hand problem limited your work or other regular daily activities Slightly    Arm, shoulder, or hand  pain. Mild    Tingling (pins and needles) in your arm, shoulder, or hand None    Difficulty Sleeping No difficulty            Objective measurements completed on examination: See above findings.                            PT Long Term Goals - 05/12/20 1708      PT LONG TERM GOAL #1   Title Pt will have decrease in Right arm swelling at 10 cm prox to ulnar styloid by 1 cm    Baseline 21.7 on 05/12/2020    Time 4    Period Weeks    Status New      PT LONG TERM GOAL #2   Title Pt will report she knows lymphedema risk reduction practices and what to do to manage swelling    Time 4    Period Weeks    Status New      PT LONG TERM GOAL #3   Title Pt will be independent in a home exericse program for shoulder ROM and strength    Time 4    Period Weeks    Status New      PT LONG TERM  GOAL #4   Title Pt will have 140 degrees of right shoulder flexion so that she can perform her activities easier    Baseline 125 on 05/12/2020    Time 4    Period Weeks    Status New                  Plan - 05/12/20 1702    Clinical Impression Statement Pt presents to PT with right arm swelling of less than 2 weeks and tenderness fullness with erythema of skin at right anterior lateral chest just above her right mastectomy scar. She says she has always had some fullness here but it is bigger tender and pink skin in the past few days. Pt also has decreased right shoulder ROM and generalized muscle atropy. She would benefit from PT to help with the swelling once she has been seen my MD and cleared with regards to no cellulitis and also to help with shoulder ROM and strength    Personal Factors and Comorbidities Age;Comorbidity 3+    Comorbidities past breast cancer surgery x 3, radiation, bilateral THR    Examination-Activity Limitations Reach Overhead    Examination-Participation Restrictions Yard Work    Stability/Clinical Decision Making Evolving/Moderate complexity     Clinical Decision Making Moderate    Rehab Potential Good    PT Frequency 2x / week    PT Duration 4 weeks    PT Treatment/Interventions ADLs/Self Care Home Management;Therapeutic activities;Functional mobility training;Therapeutic exercise;Orthotic Fit/Training;Manual lymph drainage;Manual techniques;Patient/family education;Compression bandaging;Scar mobilization;Taping;Passive range of motion    PT Next Visit Plan Cellulitis?? Ok to proceed??  MLD, tg soft or compression sleeve, ROM to shoulder exercise    Consulted and Agree with Plan of Care Patient           Patient will benefit from skilled therapeutic intervention in order to improve the following deficits and impairments:  Decreased range of motion,Decreased scar mobility,Increased edema,Postural dysfunction,Impaired UE functional use,Impaired flexibility,Increased fascial restricitons,Decreased strength,Pain,Decreased skin integrity  Visit Diagnosis: Postmastectomy lymphedema - Plan: PT plan of care cert/re-cert  Stiffness of right shoulder, not elsewhere classified - Plan: PT plan of care cert/re-cert  Muscle weakness (generalized) - Plan: PT plan of care cert/re-cert     Problem List Patient Active Problem List   Diagnosis Date Noted  . 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 Trochanteric bursitis of right hip 07/06/2017  . 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 Monoallelic mutation of NBN gene   . Genetic testing 01/13/2015  . Family history of breast cancer   . Family history of colon cancer   . Breast cancer of lower-inner quadrant of right female breast ([place]) 12/30/2014   Donato Heinz. Owens Shark PT  Norwood Levo 05/12/2020, 5:15 PM  [place], [place], [address] Phone: [phone]   Fax:  [phone]  Name: Shawna [name] MRN: [id number] Date of Birth:  November 06, 1940

## 2020-05-12 NOTE — Telephone Encounter (Signed)
I received a message from her physical therapy department that there appears to be concern for cellulitis.  I will call the patient and she does not have any fever but there is erythema and swelling.  Therefore I sent a prescription for Augmentin for her.  I will be seeing her on Thursday to assess her wound.

## 2020-05-13 ENCOUNTER — Other Ambulatory Visit: Payer: Self-pay | Admitting: *Deleted

## 2020-05-13 DIAGNOSIS — C50311 Malignant neoplasm of lower-inner quadrant of right female breast: Secondary | ICD-10-CM

## 2020-05-13 DIAGNOSIS — Z171 Estrogen receptor negative status [ER-]: Secondary | ICD-10-CM

## 2020-05-13 NOTE — Progress Notes (Signed)
Patient Care Team: [name], MD as PCP - General (Internal Medicine) [name], MD as Consulting Physician (General Surgery) [name], MD as Consulting Physician (Hematology and Oncology) Delice Bison, Charlestine Massed, NP as Nurse Practitioner (Hematology and Oncology) Salvadore Dom, MD as Consulting Physician (Obstetrics and Gynecology)  DIAGNOSIS:    ICD-10-CM   1. 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 Genes tested include:  ATM, BARD1, BRCA1, BRCA2, BRIP1, CDH1, CHEK2, EPCAM, FANCC, MLH1, MSH2, MSH6, NBN, PALB2, PMS2, PTEN, RAD51C, RAD51D, TP53, and XRCC2.   02/03/2015 Surgery   Right lumpectomy: Invasive ductal carcinoma grade 2, 2.1 cm, with associated DCIS, margins are negative, 0/1 lymph node, ER 0%, PR 0%, HER-2 positive ratio 2.31, T2 N0 stage II a   03/09/2015 - 06/22/2015 Chemotherapy   Adjuvant chemotherapy with [place] 6 followed by Herceptin maintenance for 1 year   08/11/2015 - 09/23/2015 Radiation Therapy   Adjuvant radiation therapy [name]):  1) Right Breast / 50 Gy in 25 fractions ; 2) Right Breast Boost / 10 Gy in 5 fractions   01/30/2017 Relapse/Recurrence   Skin biopsy right breast: Metastatic breast cancer GCDFP strongly positive   02/15/2017 PET scan   Hypermetabolic focus of skin thickening along the medial right breast/chest wall consistent with recurrent  disease, no other hypermetabolic metastases identified.   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 Mammogram on 02/24/20 showed no evidence of malignancy. She presents to the clinic today for follow-up. 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  She saw Maudry Diego with physical therapy who asked her to follow-up with [place].  With the tenderness and slight redness the concern was cellulitis.  Which she was started on Augmentin and she tells me that arm swelling is slightly better since antibiotics were started.  She has 3 more days of antibiotics left.  Denies any fevers or chills.  ALLERGIES:  has No Known Allergies.  MEDICATIONS:  Current Outpatient Medications  Medication Sig Dispense Refill  . amoxicillin-clavulanate (AUGMENTIN) 875-125 MG tablet Take 1 tablet by mouth 2 (two) times daily. 14 tablet 0  . Biotin 5000 MCG CAPS Take by mouth.     . calcium-vitamin D (  OSCAL WITH D) 250-125 MG-UNIT tablet Take 1 tablet by mouth daily. Patient takes calcium 1261m plus vitamin D3 1,000 IU     . cholecalciferol (VITAMIN D) 1000 units tablet Take 2,000 Units by mouth daily.     . CRESTOR 20 MG tablet Take 1 tablet by mouth daily.   0  .  cyanocobalamin 100 MCG tablet Take 100 mcg by mouth daily.  (Patient not taking: Reported on 05/12/2020)    . estradiol (ESTRACE) 0.1 MG/GM vaginal cream 1 gram vaginally qhs x 1 week, then twice weekly 42.5 g 1  . hydrochlorothiazide (MICROZIDE) 12.5 MG capsule Take 12.5 mg by mouth daily.     .Marland Kitchenlosartan (COZAAR) 100 MG tablet Take 100 mg by mouth daily.   0   No current facility-administered medications for this visit.    PHYSICAL EXAMINATION: ECOG PERFORMANCE STATUS: 1 - Symptomatic but completely ambulatory  Vitals:   05/14/20 0951  BP: (!) 146/79  Pulse: 70  Resp: 18  Temp: (!) 97.5 F (36.4 C)  SpO2: 100%   Filed Weights   05/14/20 0951  Weight: 150 lb 12.8 oz (68.4 kg)    BREAST: No palpable masses or nodules in either right or left breasts. No palpable axillary supraclavicular or infraclavicular adenopathy no breast tenderness or nipple discharge. (exam performed in the presence of a chaperone)  LABORATORY DATA:  I have reviewed the data as listed CMP Latest Ref Rng & Units 11/29/2017 03/07/2017 01/25/2016  Glucose 70 - 99 mg/dL 139(H) 82 79  BUN 8 - 23 mg/dL 22 26(H) 30.7(H)  Creatinine 0.44 - 1.00 mg/dL 1.00 0.94 1.0  Sodium 135 - 145 mmol/L 141 140 140  Potassium 3.5 - 5.1 mmol/L 3.3(L) 3.4(L) 3.8  Chloride 98 - 111 mmol/L 106 107 -  CO2 22 - 32 mmol/L '27 22 24  ' Calcium 8.9 - 10.3 mg/dL 9.7 9.2 9.8  Total Protein 6.5 - 8.1 g/dL 6.9 - 6.5  Total Bilirubin 0.3 - 1.2 mg/dL 0.7 - 0.69  Alkaline Phos 38 - 126 U/L 89 - 113  AST 15 - 41 U/L 24 - 33  ALT 0 - 44 U/L 20 - 33    Lab Results  Component Value Date   WBC 3.7 (L) 05/14/2020   HGB 13.7 05/14/2020   HCT 40.3 05/14/2020   MCV 98.8 05/14/2020   PLT 192 05/14/2020   NEUTROABS 2.9 05/14/2020    ASSESSMENT & PLAN:  [place]ast cancer of lower-inner quadrant of right [place] breast [place] 02/[place]/2015: Invasive ductal carcinoma grade 2, 2.1 cm, with associated DCIS, margins are negative, 0/1 lymph  node, ER 0%, PR 0%, HER-2 positiveratio 2.31, T2 N0 stage II a. (Right breast biopsy 3:30 position 12/09/2014: Invasive ductal carcinoma grade 3, ER 0%, PR 0%, HER-2 negativeratio 1.5, Ki-67 90%, 1 cm irregular mass T1C N0 stage IA clinical stage) Genetic counseling revealed NBN mutation  2. Adjuvant chemotherapy with [place] cycles started 03/09/15 completed 06/22/15 tookHerceptin maintenance for 1 year until 02/15/2016 3. Followed by radiation therapy 08/11/2015 to09/05/2015 4. Cutaneous recurrence: 03/07/2017: Right mastectomy: IDC grade 3, 2 foci spanning 1.2 cm and 1.1 cm, lymphovascular invasion is present including dermal lymphatics, margins negative, 0/2 lymph nodes negative, ER 0%, PR 0%, HER-2 negative, Ki-67 not done,pT4b (skin involvement) N0 stage IIIc PET/CT 02/16/2017: No metastatic disease ----------------------------------------------------------------------------------------------- Patient decided not to receive any further systemic chemotherapy. There is no role of antiestrogen therapy because she is triple negative.  Chest wall Recurrence:  10/10/17: Chest  wall excision: Breast cancer, ER 0%, PR 0%, HER-2 negative Adjuvant radiation therapy 11/14/2017-12/29/2017 Radiation pneumonitis on CT chest 03/05/2018  Breast cancer surveillance: 02/22/2019: Left breast mammogram: Benign, breast density category C 07/26/2019: Breast exam: Benign, right mastectomy scar tissue intact without any lumps or nodules  Cellulitis left breast: Started 05/12/2020 on Augmentin (CBC today does not show leukocytosis) Differential diagnosis infection versus malignancy. We will get an ultrasound of the right axilla for further evaluation.  Depending on that she might need a biopsy. Right arm lymphedema: She has an appointment next week with physical therapy and they might be prescribing her a sleeve.  Further appointments based upon the ultrasound report.  I will call her with the results. Orders  Placed This Encounter  Procedures  . US BREAST LTD UNI RIGHT INC AXILLA    Standing Status:   Future    Standing Expiration Date:   05/14/2021    Order Specific Question:   Reason for Exam (SYMPTOM  OR DIAGNOSIS REQUIRED)    Answer:   Right chest wall and axillary swelling with pain    Order Specific Question:   Preferred imaging location?    Answer:   External    Comments:   [facility]    Order Specific Question:   Release to patient    Answer:   Immediate   The patient has a good understanding of the overall plan. she agrees with it. she will call with any problems that may develop before the next visit here.  Total time spent: 30 mins including face to face time and time spent for planning, charting and coordination of care  Rulon Eisenmenger, MD, MPH 05/14/2020  I, Molly Dorshimer, am acting as scribe for Dr. [name].  I have reviewed the above documentation for accuracy and completeness, and I agree with the above.

## 2020-05-14 ENCOUNTER — Other Ambulatory Visit: Payer: Self-pay

## 2020-05-14 ENCOUNTER — Other Ambulatory Visit: Payer: Self-pay | Admitting: *Deleted

## 2020-05-14 ENCOUNTER — Inpatient Hospital Stay: Payer: Medicare Other | Attending: Hematology and Oncology

## 2020-05-14 ENCOUNTER — Inpatient Hospital Stay: Payer: Medicare Other | Admitting: Hematology and Oncology

## 2020-05-14 DIAGNOSIS — C50311 Malignant neoplasm of lower-inner quadrant of right female breast: Secondary | ICD-10-CM

## 2020-05-14 DIAGNOSIS — Z923 Personal history of irradiation: Secondary | ICD-10-CM | POA: Diagnosis not present

## 2020-05-14 DIAGNOSIS — Z171 Estrogen receptor negative status [ER-]: Secondary | ICD-10-CM

## 2020-05-14 DIAGNOSIS — Z79899 Other long term (current) drug therapy: Secondary | ICD-10-CM | POA: Diagnosis not present

## 2020-05-14 DIAGNOSIS — Z9011 Acquired absence of right breast and nipple: Secondary | ICD-10-CM | POA: Diagnosis not present

## 2020-05-14 DIAGNOSIS — Z9221 Personal history of antineoplastic chemotherapy: Secondary | ICD-10-CM | POA: Diagnosis not present

## 2020-05-14 LAB — CBC WITH DIFFERENTIAL (CANCER CENTER ONLY)
Abs Immature Granulocytes: 0 10*3/uL (ref 0.00–0.07)
Basophils Absolute: 0 10*3/uL (ref 0.0–0.1)
Basophils Relative: 1 %
Eosinophils Absolute: 0.1 10*3/uL (ref 0.0–0.5)
Eosinophils Relative: 3 %
HCT: 40.3 % (ref 36.0–46.0)
Hemoglobin: 13.7 g/dL (ref 12.0–15.0)
Immature Granulocytes: 0 %
Lymphocytes Relative: 12 %
Lymphs Abs: 0.5 10*3/uL — ABNORMAL LOW (ref 0.7–4.0)
MCH: 33.6 pg (ref 26.0–34.0)
MCHC: 34 g/dL (ref 30.0–36.0)
MCV: 98.8 fL (ref 80.0–100.0)
Monocytes Absolute: 0.2 10*3/uL (ref 0.1–1.0)
Monocytes Relative: 5 %
Neutro Abs: 2.9 10*3/uL (ref 1.7–7.7)
Neutrophils Relative %: 79 %
Platelet Count: 192 10*3/uL (ref 150–400)
RBC: 4.08 MIL/uL (ref 3.87–5.11)
RDW: 12.8 % (ref 11.5–15.5)
WBC Count: 3.7 10*3/uL — ABNORMAL LOW (ref 4.0–10.5)
nRBC: 0 % (ref 0.0–0.2)

## 2020-05-14 LAB — CMP (CANCER CENTER ONLY)
ALT: 14 U/L (ref 0–44)
AST: 17 U/L (ref 15–41)
Albumin: 3.9 g/dL (ref 3.5–5.0)
Alkaline Phosphatase: 83 U/L (ref 38–126)
Anion gap: 10 (ref 5–15)
BUN: 29 mg/dL — ABNORMAL HIGH (ref 8–23)
CO2: 25 mmol/L (ref 22–32)
Calcium: 9.3 mg/dL (ref 8.9–10.3)
Chloride: 104 mmol/L (ref 98–111)
Creatinine: 1.1 mg/dL — ABNORMAL HIGH (ref 0.44–1.00)
GFR, Estimated: 51 mL/min — ABNORMAL LOW (ref >60)
Glucose, Bld: 79 mg/dL (ref 70–99)
Potassium: 3.8 mmol/L (ref 3.5–5.1)
Sodium: 139 mmol/L (ref 135–145)
Total Bilirubin: 0.8 mg/dL (ref 0.3–1.2)
Total Protein: 6.5 g/dL (ref 6.5–8.1)

## 2020-05-14 NOTE — Assessment & Plan Note (Signed)
Right lumpectomy 02/03/2015: Invasive ductal carcinoma grade 2, 2.1 cm, with associated DCIS, margins are negative, 0/1 lymph node, ER 0%, PR 0%, HER-2 positiveratio 2.31, T2 N0 stage II a. (Right breast biopsy 3:30 position 12/09/2014: Invasive ductal carcinoma grade 3, ER 0%, PR 0%, HER-2 negativeratio 1.5, Ki-67 90%, 1 cm irregular mass T1C N0 stage IA clinical stage) Genetic counseling revealed NBN mutation  2. Adjuvant chemotherapy with [place] 6 cycles started 03/09/15 completed 06/22/15 tookHerceptin maintenance for 1 year until 02/15/2016 3. Followed by radiation therapy 08/11/2015 to09/05/2015 4. Cutaneous recurrence: 03/07/2017: Right mastectomy: IDC grade 3, 2 foci spanning 1.2 cm and 1.1 cm, lymphovascular invasion is present including dermal lymphatics, margins negative, 0/2 lymph nodes negative, ER 0%, PR 0%, HER-2 negative, Ki-67 not done,pT4b (skin involvement) N0 stage IIIc PET/CT 02/16/2017: No metastatic disease ----------------------------------------------------------------------------------------------- Patient decided not to receive any further systemic chemotherapy. There is no role of antiestrogen therapy because she is triple negative.  Chest wall Recurrence:  10/10/17: Chest wall excision: Breast cancer, ER 0%, PR 0%, HER-2 negative Adjuvant radiation therapy 11/14/2017-12/29/2017 Radiation pneumonitis on CT chest 03/05/2018  Breast cancer surveillance: 02/22/2019: Left breast mammogram: Benign, breast density category C 07/26/2019: Breast exam: Benign, right mastectomy scar tissue intact without any lumps or nodules  Cellulitis left breast: Started 05/12/2020 on Augmentin

## 2020-05-14 NOTE — Progress Notes (Signed)
Per MD request RN successfully faxed STAT right breast US to [facility].  RN scheduled apt for Monday 05/18/20 at [facility].  Pt notified and verbalized understanding of date and time.

## 2020-05-18 DIAGNOSIS — R928 Other abnormal and inconclusive findings on diagnostic imaging of breast: Secondary | ICD-10-CM | POA: Diagnosis not present

## 2020-05-20 ENCOUNTER — Ambulatory Visit: Payer: Medicare Other | Attending: Hematology and Oncology

## 2020-05-20 ENCOUNTER — Other Ambulatory Visit: Payer: Self-pay

## 2020-05-20 DIAGNOSIS — M6281 Muscle weakness (generalized): Secondary | ICD-10-CM | POA: Insufficient documentation

## 2020-05-20 DIAGNOSIS — I972 Postmastectomy lymphedema syndrome: Secondary | ICD-10-CM | POA: Diagnosis not present

## 2020-05-20 DIAGNOSIS — M25611 Stiffness of right shoulder, not elsewhere classified: Secondary | ICD-10-CM | POA: Insufficient documentation

## 2020-05-20 NOTE — Therapy (Signed)
[place], [place], [address] Phone: [phone]   Fax:  [phone]  Physical Therapy Treatment  Patient Details  Name: Shawna [name] MRN: [id number] Date of Birth: [birth year] Referring Provider (PT): Dr. Lindi Adie   Encounter Date: 05/20/2020   PT End of Session - 05/20/20 1619    Visit Number 2    Number of Visits 9    Date for PT Re-Evaluation 06/11/20    PT Start Time 1400    PT Stop Time 1500    PT Time Calculation (min) 60 min    Activity Tolerance Patient tolerated treatment well    Behavior During Therapy [facility] for tasks assessed/performed           Past Medical History:  Diagnosis Date  . Breast cancer ([place]) 11/2014  . Family history of breast cancer   . Family history of colon cancer   . History of kidney stones   . History of radiation therapy 08/11/15- 09/23/2015   Right Breast  . History of radiation therapy 11/13/17- 12/29/17   Right chest wall and IM nodes 50.04 Gy in 28 fractions, Right supraclavicular and PAB nodes 50.04 Gy in 28 fractions, Right Chest wall boost/ 10 Gy in 5 fractions.   . Hyperlipidemia   . Hypertension   . Monoallelic mutation of NBN gene   . Osteoporosis   . Skin cancer of nose     Past Surgical History:  Procedure Laterality Date  . APPENDECTOMY    . BREAST LUMPECTOMY WITH RADIOACTIVE SEED AND SENTINEL LYMPH NODE BIOPSY Right 02/03/2015   Procedure: RIGHT BREAST LUMPECTOMY WITH RADIOACTIVE SEED AND RIGHT SENTINEL LYMPH NODE BIOPSY;  Surgeon: [name], MD;  Location: [facility];  Service: General;  Laterality: Right;  . BREAST SURGERY  11/2012   biopsy, benign breast tissue  . COLONOSCOPY  06/2010   rec  . LITHOTRIPSY     kidney stone on left side  . MASTECTOMY W/ SENTINEL NODE BIOPSY Right 03/07/2017   Procedure: RIGHT TOTAL MASTECTOMY WITH RIGHT AXILLARY SENTINEL LYMPH NODE BIOPSY;  Surgeon: [name], MD;  Location: [facility];   Service: General;  Laterality: Right;  . OVARIAN CYST REMOVAL  age 23  . POLYPECTOMY  04/2005   colon  . PORTACATH PLACEMENT Left 02/03/2015   Procedure: INSERTION PORT-A-CATH WITH ULTRA SOUND GUIDANCE;  Surgeon: [name], MD;  Location: [facility];  Service: General;  Laterality: Left;  . reexcision  10/09/2017   Dr. Lucia Gaskins, chest wall excision for recurrent breast cancer.     There were no vitals filed for this visit.   Subjective Assessment - 05/20/20 1357    Subjective Redness has gone down and I had an US of the axillary region and that was all fine. Swelling is a little better but it is still swollen especially in the lower forearm.  I don't see much change in the redness, but its not painful    Pertinent History right breast cancer (triple negative) with chemo and lumpectomy with sentines node biopsy 02/03/2015 recurrance with mastectomy with sentinel node biopsy 03/07/2017 with re-excision to recurrance in skin 10/09/2017 with radiation to right chest wall 11/13/2017-12/29/2017.  Past history includes bilateral total hip replacements, hyperlipidemia, hypertension and osteoporosis    Patient Stated Goals to get rid of the swelling    Currently in Pain? No/denies                 LYMPHEDEMA/ONCOLOGY QUESTIONNAIRE -  05/20/20 0001      Right Upper Extremity Lymphedema   10 cm Proximal to Olecranon Process 27.5 cm    Olecranon Process 27 cm    15 cm Proximal to Ulnar Styloid Process 23.2 cm    10 cm Proximal to Ulnar Styloid Process 19.3 cm    Just Proximal to Ulnar Styloid Process 16.3 cm    At Base of 2nd Digit 5.8 cm                      [facility] Adult PT Treatment/Exercise - 05/20/20 0001      Manual Therapy   Edema Management Small TG soft applied to right UE.  Large artiflex applied over TG soft with hand wrap 6 cm wrap and 1 10 cm wrap wrist to axilla to decrease swelling    Manual Lymphatic Drainage (MLD) MLD was performed to short neck, left  axillary and right inguinal LN's, Anterior interaxillary anastomosis, Right axilloinguinal pathway, and right UE starting proximally and retracing steps and working distally to the hand                  PT Education - 05/20/20 1619    Education Details Pt was given written instructions for hand and arm wrap and did not practice but observed me wrapping with explanation    Person(s) Educated Patient    Methods Explanation;Handout    Comprehension Verbalized understanding;Need further instruction               PT Long Term Goals - 05/20/20 1630      PT LONG TERM GOAL #5   Title Pt will be fit for compression sleeve and will be independent with donning and doffing    Time 4    Period Weeks    Status New    Target Date 06/23/20                 Plan - 05/20/20 1620    Clinical Impression Statement Pt. returns to therapy after a week on antibiotics with decrease in swelling although forearm is still quite swollen and mild redness remains.  There is no pain. She had a negative axillary [place].  Had evaluating therapist observe the area in addition and she also felt she would be safe for MLD and wrapping.  Initiated MLD with verbalization to pt, and pt was wrapped by therapist with TG soft, artiflex, a 6 cm to hand and 10 cm wrist to axilla.  She was given written instructions and advised to wear wrap as long as comfortable, but if it slides down to remove it.  She is to watch for hand swelling and remove wraps if she notices any of that as well.  Pt verbalized understanding.  She was told she may bring her son to learn wrapping if she wishes.    Personal Factors and Comorbidities Age;Comorbidity 3+    Comorbidities past breast cancer surgery x 3, radiation, bilateral THR    Examination-Activity Limitations Reach Overhead    Examination-Participation Restrictions Yard Work    Stability/Clinical Decision Making Evolving/Moderate complexity    Rehab Potential Good    PT Frequency  2x / week    PT Duration 4 weeks    PT Treatment/Interventions ADLs/Self Care Home Management;Therapeutic activities;Functional mobility training;Therapeutic exercise;Orthotic Fit/Training;Manual lymph drainage;Manual techniques;Patient/family education;Compression bandaging;Scar mobilization;Taping;Passive range of motion    PT Next Visit Plan recheck redness, swelling, continue MLD and instruct, check comfort of wraps and benefit, continue wrapping  and add a wrap prn, instruct pt or son in wrapping, MFR to chest region, Chip pack to area of swelling ant lateral chest medial to arm    Recommended Other Services compression sleeve    Consulted and Agree with Plan of Care Patient           Patient will benefit from skilled therapeutic intervention in order to improve the following deficits and impairments:  Decreased range of motion,Decreased scar mobility,Increased edema,Postural dysfunction,Impaired UE functional use,Impaired flexibility,Increased fascial restricitons,Decreased strength,Pain,Decreased skin integrity  Visit Diagnosis: Postmastectomy lymphedema  Stiffness of right shoulder, not elsewhere classified  Muscle weakness (generalized)     Problem List Patient Active Problem List   Diagnosis Date Noted  . 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 Trochanteric bursitis of right hip 07/06/2017  . 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 Monoallelic mutation of NBN gene   . Genetic testing 01/13/2015  . Family history of breast cancer   . Family history of colon cancer   . Breast cancer of lower-inner quadrant of right female breast ([place]) 12/30/2014    Claris Pong 05/20/2020, 4:31 PM  [place], [place], [address] Phone: [phone]   Fax:  [phone]  Name: Shawna [name] MRN: [id number] Date of Birth: 1940/06/06 Cheral Almas, PT 05/20/20 4:31 PM

## 2020-05-20 NOTE — Patient Instructions (Signed)
Pt was given written instructions for hand and arm wrap.  Advised to remove wrap if having discomfort, pain, tingling or if it slides, or if she notices finger swelling.  Advised to call with questions.

## 2020-05-22 ENCOUNTER — Ambulatory Visit: Payer: Medicare Other

## 2020-05-22 ENCOUNTER — Other Ambulatory Visit: Payer: Self-pay

## 2020-05-22 DIAGNOSIS — M25611 Stiffness of right shoulder, not elsewhere classified: Secondary | ICD-10-CM | POA: Diagnosis not present

## 2020-05-22 DIAGNOSIS — M6281 Muscle weakness (generalized): Secondary | ICD-10-CM | POA: Diagnosis not present

## 2020-05-22 DIAGNOSIS — I972 Postmastectomy lymphedema syndrome: Secondary | ICD-10-CM | POA: Diagnosis not present

## 2020-05-22 NOTE — Therapy (Signed)
[place], [place], [address] Phone: [phone]   Fax:  [phone]  Physical Therapy Treatment  Patient Details  Name: Shawna [name] MRN: [id number] Date of Birth: 1940/09/24 Referring Provider (PT): Dr. Lindi Adie   Encounter Date: 05/22/2020   PT End of Session - 05/22/20 1119    Visit Number 3    Number of Visits 9    Date for PT Re-Evaluation 06/11/20    PT Start Time 1003    PT Stop Time 1107    PT Time Calculation (min) 64 min    Activity Tolerance Patient tolerated treatment well    Behavior During Therapy [facility] for tasks assessed/performed           Past Medical History:  Diagnosis Date  . Breast cancer ([place]) 11/2014  . Family history of breast cancer   . Family history of colon cancer   . History of kidney stones   . History of radiation therapy 08/11/15- 09/23/2015   Right Breast  . History of radiation therapy 11/13/17- 12/29/17   Right chest wall and IM nodes 50.04 Gy in 28 fractions, Right supraclavicular and PAB nodes 50.04 Gy in 28 fractions, Right Chest wall boost/ 10 Gy in 5 fractions.   . Hyperlipidemia   . Hypertension   . Monoallelic mutation of NBN gene   . Osteoporosis   . Skin cancer of nose     Past Surgical History:  Procedure Laterality Date  . APPENDECTOMY    . BREAST LUMPECTOMY WITH RADIOACTIVE SEED AND SENTINEL LYMPH NODE BIOPSY Right 02/03/2015   Procedure: RIGHT BREAST LUMPECTOMY WITH RADIOACTIVE SEED AND RIGHT SENTINEL LYMPH NODE BIOPSY;  Surgeon: [name], MD;  Location: [place];  Service: General;  Laterality: Right;  . BREAST SURGERY  11/2012   biopsy, benign breast tissue  . COLONOSCOPY  06/2010   rec  . LITHOTRIPSY     kidney stone on left side  . MASTECTOMY W/ SENTINEL NODE BIOPSY Right 03/07/2017   Procedure: RIGHT TOTAL MASTECTOMY WITH RIGHT AXILLARY SENTINEL LYMPH NODE BIOPSY;  Surgeon: [name], MD;  Location: [place];   Service: General;  Laterality: Right;  . OVARIAN CYST REMOVAL  age 80  . POLYPECTOMY  04/2005   colon  . PORTACATH PLACEMENT Left 02/03/2015   Procedure: INSERTION PORT-A-CATH WITH ULTRA SOUND GUIDANCE;  Surgeon: [name], MD;  Location: [place];  Service: General;  Laterality: Left;  . reexcision  10/09/2017   Dr. Lucia Gaskins, chest wall excision for recurrent breast cancer.     There were no vitals filed for this visit.   Subjective Assessment - 05/22/20 1002    Subjective I had terrible pain at my thumb the first night I had it on. I took it off the next day and rewrapped without the cotton and TG on my thumb.  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  Past history includes bilateral total hip replacements, hyperlipidemia, hypertension and osteoporosis    Patient Stated Goals to get rid of the swelling    Currently in Pain? No/denies                 LYMPHEDEMA/ONCOLOGY QUESTIONNAIRE - 05/22/20 0001  Right Upper Extremity Lymphedema   15 cm Proximal to Ulnar Styloid Process 21.3 cm    10 cm Proximal to Ulnar Styloid Process 17.9 cm                      [facility] Adult PT Treatment/Exercise - 05/22/20 0001      Manual Therapy   Edema Management Small TG soft applied to right UE. orange foam applied to posterior forearm and held on with Large artiflex. Hand wrap with 6 cm wrap and 1 10 cm wrap wrist to axilla to decrease swelling was applied by PT.  Prior to PT wrapping, pts son was educated in and practiced finger wrap, hand wrap and arm wrap.  They will not apply finger wrap unless pt starts to expeience finger swelling.    Manual Lymphatic Drainage (MLD) MLD was  performed to short neck, left axillary and right inguinal LN's, Anterior interaxillary anastomosis, Right axilloinguinal pathway, and right UE starting proximally and retracing steps and working distally to the hand                  PT Education - 05/22/20 1117    Education Details Pt watched as her son practiced finger wrap, hand wrap, and UE wrap,    Person(s) Educated Patient;Child(ren)    Methods Explanation;Demonstration;Handout    Comprehension Verbalized understanding;Returned demonstration;Other (comment);Need further instruction   son return demonstrated but will require review              PT Long Term Goals - 05/22/20 1127      PT LONG TERM GOAL #1   Title Pt will have decrease in Right arm swelling at 10 cm prox to ulnar styloid by 1 cm    Baseline 21.7 on 05/12/2020    Time 4    Period Weeks    Status New      PT LONG TERM GOAL #2   Title Pt will report she knows lymphedema risk reduction practices and what to do to manage swelling    Time 4    Period Weeks    Status New      PT LONG TERM GOAL #3   Title Pt will be independent in a home exericse program for shoulder ROM and strength    Time 4    Period Weeks    Status New      PT LONG TERM GOAL #4   Title Pt will have 140 degrees of right shoulder flexion so that she can perform her activities easier    Baseline 125 on 05/12/2020    Time 4    Period Weeks    Status New      PT LONG TERM GOAL #5   Title Pt will be fit for compression sleeve and will be independent with donning and doffing    Time 4    Period Weeks    Status New                 Plan - 05/22/20 1119    Clinical Impression Statement No increased redness noted today and pt does not have pain.  She has had some good reduction in the forearm with wrapping and she is compliant with wearing wraps.  She continues with area of swelling at axillary region and she is going to contact a special place to see if she can get a bra that  will come up higher there so a chip pack can be held  in.  Pts son was instructed in compression bandaging and practice finger wrap and hand wrap and therapist redid all wraps before pt left.  She will have fingers wrapped only if they start to swell. She was given a finger wrap and additional 10 cm wrap to consider applying if wrap is comfortable and she feels like she can tolerate the icreased pressure.  They were shown how to check capillary refill and again reminded to remove wrap if she has any discomfort at all.    Personal Factors and Comorbidities Age;Comorbidity 3+    Comorbidities past breast cancer surgery x 3, radiation, bilateral THR    Examination-Activity Limitations Reach Overhead    Examination-Participation Restrictions Yard Work    Stability/Clinical Decision Making Evolving/Moderate complexity    Rehab Potential Good    PT Frequency 2x / week    PT Duration 4 weeks    PT Treatment/Interventions ADLs/Self Care Home Management;Therapeutic activities;Functional mobility training;Therapeutic exercise;Orthotic Fit/Training;Manual lymph drainage;Manual techniques;Patient/family education;Compression bandaging;Scar mobilization;Taping;Passive range of motion    PT Next Visit Plan add supine wand,recheck redness, swelling, continue MLD and instruct, check comfort of wraps and benefit, continue wrapping and add a wrap prn, instruct pt or son in wrapping, MFR to chest region, Chip pack to area of swelling ant lateral chest medial to arm.  See if pt got compression bra    Consulted and Agree with Plan of Care Patient           Patient will benefit from skilled therapeutic intervention in order to improve the following deficits and impairments:  Decreased range of motion,Decreased scar mobility,Increased edema,Postural dysfunction,Impaired UE functional use,Impaired flexibility,Increased fascial restricitons,Decreased strength,Pain,Decreased skin integrity  Visit Diagnosis: Postmastectomy  lymphedema  Stiffness of right shoulder, not elsewhere classified  Muscle weakness (generalized)     Problem List Patient Active Problem List   Diagnosis Date Noted  . 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 Trochanteric bursitis of right hip 07/06/2017  . 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 Monoallelic mutation of NBN gene   . Genetic testing 01/13/2015  . Family history of breast cancer   . Family history of colon cancer   . Breast cancer of lower-inner quadrant of right female breast [facility]) 12/30/2014    Shawna [name] 05/22/2020, 11:29 AM  [place], [place], [address] Phone: [phone]   Fax:  [phone]  Name: Shawna [name] MRN: [id number] Date of Birth: 1940-05-01 Cheral Almas, PT 05/22/20 11:30 AM

## 2020-05-25 ENCOUNTER — Other Ambulatory Visit: Payer: Self-pay | Admitting: Obstetrics and Gynecology

## 2020-05-25 DIAGNOSIS — I700 Atherosclerosis of aorta: Secondary | ICD-10-CM | POA: Diagnosis not present

## 2020-05-25 DIAGNOSIS — E785 Hyperlipidemia, unspecified: Secondary | ICD-10-CM | POA: Diagnosis not present

## 2020-05-25 DIAGNOSIS — I972 Postmastectomy lymphedema syndrome: Secondary | ICD-10-CM | POA: Diagnosis not present

## 2020-05-25 DIAGNOSIS — I10 Essential (primary) hypertension: Secondary | ICD-10-CM | POA: Diagnosis not present

## 2020-05-25 NOTE — Telephone Encounter (Signed)
Medication refill request: Estradiol vaginal cream  Last AEX:  06-13-2018 JJ  Next OV: 05-27-20  Last MMG (if hormonal medication request): 02-24-20 density C/BIRADS 1 negative  Refill authorized: Today, please advise.   Medication pended for #42.5g, 0RF. Please refill if appropriate.

## 2020-05-26 ENCOUNTER — Other Ambulatory Visit: Payer: Self-pay

## 2020-05-26 ENCOUNTER — Ambulatory Visit: Payer: Medicare Other

## 2020-05-26 DIAGNOSIS — I972 Postmastectomy lymphedema syndrome: Secondary | ICD-10-CM

## 2020-05-26 DIAGNOSIS — M6281 Muscle weakness (generalized): Secondary | ICD-10-CM | POA: Diagnosis not present

## 2020-05-26 DIAGNOSIS — M25611 Stiffness of right shoulder, not elsewhere classified: Secondary | ICD-10-CM

## 2020-05-26 NOTE — Therapy (Signed)
[place], [place], [address] Phone: [phone]   Fax:  [phone]  Physical Therapy Treatment  Patient Details  Name: Shawna [name] MRN: [id number] Date of Birth: 1940-03-30 Referring Provider (PT): Dr. Lindi Adie   Encounter Date: 05/26/2020   PT End of Session - 05/26/20 1500    Visit Number 4    Number of Visits 9    Date for PT Re-Evaluation 06/11/20    PT Start Time 1402    PT Stop Time 1455    PT Time Calculation (min) 53 min    Activity Tolerance Patient tolerated treatment well    Behavior During Therapy [facility] for tasks assessed/performed           Past Medical History:  Diagnosis Date  . Breast cancer ([place]) 11/2014  . Family history of breast cancer   . Family history of colon cancer   . History of kidney stones   . History of radiation therapy 08/11/15- 09/23/2015   Right Breast  . History of radiation therapy 11/13/17- 12/29/17   Right chest wall and IM nodes 50.04 Gy in 28 fractions, Right supraclavicular and PAB nodes 50.04 Gy in 28 fractions, Right Chest wall boost/ 10 Gy in 5 fractions.   . Hyperlipidemia   . Hypertension   . Monoallelic mutation of NBN gene   . Osteoporosis   . Skin cancer of nose     Past Surgical History:  Procedure Laterality Date  . APPENDECTOMY    . BREAST LUMPECTOMY WITH RADIOACTIVE SEED AND SENTINEL LYMPH NODE BIOPSY Right 02/03/2015   Procedure: RIGHT BREAST LUMPECTOMY WITH RADIOACTIVE SEED AND RIGHT SENTINEL LYMPH NODE BIOPSY;  Surgeon: [name], MD;  Location: [place];  Service: General;  Laterality: Right;  . BREAST SURGERY  11/2012   biopsy, benign breast tissue  . COLONOSCOPY  06/2010   rec  . LITHOTRIPSY     kidney stone on left side  . MASTECTOMY W/ SENTINEL NODE BIOPSY Right 03/07/2017   Procedure: RIGHT TOTAL MASTECTOMY WITH RIGHT AXILLARY SENTINEL LYMPH NODE BIOPSY;  Surgeon: [name], MD;  Location: [place];   Service: General;  Laterality: Right;  . OVARIAN CYST REMOVAL  age 36  . POLYPECTOMY  04/2005   colon  . PORTACATH PLACEMENT Left 02/03/2015   Procedure: INSERTION PORT-A-CATH WITH ULTRA SOUND GUIDANCE;  Surgeon: [name], MD;  Location: [place];  Service: General;  Laterality: Left;  . reexcision  10/09/2017   Dr. Lucia Gaskins, chest wall excision for recurrent breast cancer.     There were no vitals filed for this visit.   Subjective Assessment - 05/26/20 1405    Subjective I   was wrapped by Rob 2x's over the the weekend.  I  think elbow swelling has gone down.    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  Past history includes bilateral total hip replacements, hyperlipidemia, hypertension and osteoporosis    Patient Stated Goals to get rid of the swelling                 LYMPHEDEMA/ONCOLOGY QUESTIONNAIRE - 05/26/20 0001      Right Upper Extremity Lymphedema   15 cm Proximal to Ulnar Styloid Process 21.3 cm    10 cm Proximal to Ulnar Styloid Process  18.1 cm                      [facility] Adult PT Treatment/Exercise - 05/26/20 0001      Manual Therapy   Edema Management Small TG soft applied to right UE. orange foam applied to posterior forearm and held on with Large artiflex. Hand wrap with 6 cm wrap and 1 10 cm wrap wrist to axilla to decrease swelling was applied by PT.    Manual Lymphatic Drainage (MLD) MLD was performed to short neck, left axillary and right inguinal LN's, Anterior interaxillary anastomosis, Right axilloinguinal pathway, and right UE starting proximally and retracing steps and working distally to the hand.  Pt was instructed to all LN activation areas and pathways but required frequent VC's  and TC's                  PT Education - 05/26/20 1459    Education Details Pt was educated in MLD to supraclavicular, L axillary and right inguinal LN and anterior interaxillary pathway and right axillo-inguinal pathway.    Person(s) Educated Patient    Methods Explanation;Handout;Tactile cues;Verbal cues    Comprehension Need further instruction               PT Long Term Goals - 05/22/20 1127      PT LONG TERM GOAL #1   Title Pt will have decrease in Right arm swelling at 10 cm prox to ulnar styloid by 1 cm    Baseline 21.7 on 05/12/2020    Time 4    Period Weeks    Status New      PT LONG TERM GOAL #2   Title Pt will report she knows lymphedema risk reduction practices and what to do to manage swelling    Time 4    Period Weeks    Status New      PT LONG TERM GOAL #3   Title Pt will be independent in a home exericse program for shoulder ROM and strength    Time 4    Period Weeks    Status New      PT LONG TERM GOAL #4   Title Pt will have 140 degrees of right shoulder flexion so that she can perform her activities easier    Baseline 125 on 05/12/2020    Time 4    Period Weeks    Status New      PT LONG TERM GOAL #5   Title Pt will be fit for compression sleeve and will be independent with donning and doffing    Time 4    Period Weeks    Status New                 Plan - 05/26/20 1501    Clinical Impression Statement Pt with excellent improvement in right forearm swelling with wrinkles now evident and minimal pitting still present.  Area under axilla still present and likely from where her bra contacts her.  She was educated in self MLD to short neck, left axillary and right inguinal LN's, anterior interaxillary pathways and Right axilloinguinal pathway.  She required moderate VC's and TCs to do properly.  She will require review    Personal Factors and Comorbidities Age;Comorbidity 3+    Comorbidities past breast cancer surgery x 3, radiation,  bilateral THR    Examination-Activity Limitations Reach Overhead    Examination-Participation Restrictions Yard Work    Rehab Potential Good  PT Frequency 2x / week    PT Duration 4 weeks    PT Treatment/Interventions ADLs/Self Care Home Management;Therapeutic activities;Functional mobility training;Therapeutic exercise;Orthotic Fit/Training;Manual lymph drainage;Manual techniques;Patient/family education;Compression bandaging;Scar mobilization;Taping;Passive range of motion    PT Next Visit Plan add supine wand,recheck redness, swelling, continue MLD and instruct,, continue wrapping and add a wrap prn, instruct pt or son in wrapping, MFR to chest region, Chip pack to area of swelling ant lateral chest medial to arm.  See if pt got compression bra    PT Home Exercise Plan practice MLD to LN's and pathways only    Recommended Other Services sleeve/night time device? Being measured on Friday   Consulted and Agree with Plan of Care Patient           Patient will benefit from skilled therapeutic intervention in order to improve the following deficits and impairments:  Decreased range of motion,Decreased scar mobility,Increased edema,Postural dysfunction,Impaired UE functional use,Impaired flexibility,Increased fascial restricitons,Decreased strength,Pain,Decreased skin integrity  Visit Diagnosis: Postmastectomy lymphedema  Stiffness of right shoulder, not elsewhere classified  Muscle weakness (generalized)     Problem List Patient Active Problem List   Diagnosis Date Noted  . 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 Trochanteric bursitis of right hip 07/06/2017  . 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 Monoallelic mutation of NBN gene   . Genetic testing 01/13/2015  . Family history of breast cancer   . Family history of colon cancer   . Breast cancer of lower-inner quadrant of right female breast ([place]) 12/30/2014     Claris Pong 05/26/2020, 3:06 PM  [place], [place], [address] Phone: [phone]   Fax:  [phone]  Name: ANASTASHA ORTEZ MRN: [id number] Date of Birth: [birth year] Cheral Almas, PT 05/26/20 3:07 PM

## 2020-05-26 NOTE — Patient Instructions (Signed)
Pt was given written instructions for right UE MLD and will practice to LN and pathways only

## 2020-05-27 ENCOUNTER — Encounter: Payer: Self-pay | Admitting: Obstetrics and Gynecology

## 2020-05-27 ENCOUNTER — Ambulatory Visit: Payer: Medicare Other | Admitting: Obstetrics and Gynecology

## 2020-05-27 VITALS — BP 140/76 | HR 81 | Ht 68.5 in | Wt 149.0 lb

## 2020-05-27 DIAGNOSIS — N760 Acute vaginitis: Secondary | ICD-10-CM

## 2020-05-27 DIAGNOSIS — B9689 Other specified bacterial agents as the cause of diseases classified elsewhere: Secondary | ICD-10-CM | POA: Diagnosis not present

## 2020-05-27 DIAGNOSIS — B3731 Acute candidiasis of vulva and vagina: Secondary | ICD-10-CM

## 2020-05-27 DIAGNOSIS — B373 Candidiasis of vulva and vagina: Secondary | ICD-10-CM | POA: Diagnosis not present

## 2020-05-27 DIAGNOSIS — Z4689 Encounter for fitting and adjustment of other specified devices: Secondary | ICD-10-CM | POA: Diagnosis not present

## 2020-05-27 LAB — WET PREP FOR TRICH, YEAST, CLUE

## 2020-05-27 MED ORDER — FLUCONAZOLE 150 MG PO TABS: 150.0000 mg | ORAL_TABLET | Freq: Once | ORAL | 0 refills | Status: AC

## 2020-05-27 MED ORDER — METRONIDAZOLE 500 MG PO TABS: 500.0000 mg | ORAL_TABLET | Freq: Two times a day (BID) | ORAL | 0 refills | Status: DC

## 2020-05-27 NOTE — Progress Notes (Signed)
GYNECOLOGY  VISIT   HPI: 80 y.o.   Widowed White or Caucasian Not Hispanic or Latino  female   [phone] with Patient's last menstrual period was 01/17/1982 (approximate).   here for  Pessary check. Patient states that she is having vaginal discharge and burning.  She has a cube pessary for a cystocele and uterine prolapse. She uses vaginal estrogen 1 x a week secondary to atrophy and irritation from the pessary. She has been having an increase in a white vaginal d/c with odor. She was having some burning, that has improved. No vaginal bleeding. The pessary has been comfortable.   GYNECOLOGIC HISTORY: Patient's last menstrual period was 01/17/1982 (approximate). Contraception:pmp Menopausal hormone therapy: estrace        OB History    Gravida  2   Para  2   Term  2   Preterm  0   AB  0   Living  2     SAB  0   IAB  0   Ectopic  0   Multiple  0   Live Births  2              Patient Active Problem List   Diagnosis Date Noted  . 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 Trochanteric bursitis of right hip 07/06/2017  . 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 Monoallelic mutation of NBN gene   . Genetic testing 01/13/2015  . Family history of breast cancer   . Family history of colon cancer   . Breast cancer of lower-inner quadrant of right female breast ([place]) 12/30/2014    Past Medical History:  Diagnosis Date  . Breast cancer ([place]) 11/2014  . Family history of breast cancer   . Family history of colon cancer   . History of kidney stones   . History of radiation therapy 08/11/15- 09/23/2015   Right Breast  . History of radiation therapy 11/13/17- 12/29/17   Right chest wall and IM nodes 50.04 Gy in 28 fractions, Right supraclavicular and PAB nodes 50.04 Gy in 28 fractions, Right Chest wall boost/ 10 Gy in 5 fractions.   . Hyperlipidemia   . Hypertension   . Monoallelic mutation of NBN gene    . Osteoporosis   . Skin cancer of nose     Past Surgical History:  Procedure Laterality Date  . APPENDECTOMY    . BREAST LUMPECTOMY WITH RADIOACTIVE SEED AND SENTINEL LYMPH NODE BIOPSY Right 02/03/2015   Procedure: RIGHT BREAST LUMPECTOMY WITH RADIOACTIVE SEED AND RIGHT SENTINEL LYMPH NODE BIOPSY;  Surgeon: [name], MD;  Location: [place];  Service: General;  Laterality: Right;  . BREAST SURGERY  11/2012   biopsy, benign breast tissue  . COLONOSCOPY  06/2010   rec  . LITHOTRIPSY     kidney stone on left side  . MASTECTOMY W/ SENTINEL NODE BIOPSY Right 03/07/2017   Procedure: RIGHT TOTAL MASTECTOMY WITH RIGHT AXILLARY SENTINEL LYMPH NODE BIOPSY;  Surgeon: [name], MD;  Location: [place];  Service: General;  Laterality: Right;  . OVARIAN CYST REMOVAL  age 51  . POLYPECTOMY  04/2005   colon  . PORTACATH PLACEMENT Left 02/03/2015   Procedure: INSERTION PORT-A-CATH WITH ULTRA SOUND GUIDANCE;  Surgeon: [name], MD;  Location: [place];  Service: General;  Laterality: Left;  . reexcision  10/09/2017   Dr. Lucia Gaskins, chest wall excision for recurrent breast cancer.  Current Outpatient Medications  Medication Sig Dispense Refill  . Biotin 5000 MCG CAPS Take by mouth.     . calcium-vitamin D (OSCAL WITH D) 250-125 MG-UNIT tablet Take 1 tablet by mouth daily. Patient takes calcium 1200mg  plus vitamin D3 1,000 IU     . cholecalciferol (VITAMIN D) 1000 units tablet Take 2,000 Units by mouth daily.     . CRESTOR 20 MG tablet Take 1 tablet by mouth daily.   0  . estradiol (ESTRACE) 0.1 MG/GM vaginal cream USE 1 GRAM VAGINALLY 2 TIMES A WEEK at bedtime 42.5 g 1  . hydrochlorothiazide (MICROZIDE) 12.5 MG capsule Take 12.5 mg by mouth daily.     Marland Kitchen losartan (COZAAR) 100 MG tablet Take 100 mg by mouth daily.   0   No current facility-administered medications for this visit.     ALLERGIES: Patient has no known allergies.  Family History  Problem  Relation Age of Onset  . Colon cancer Sister 55       died at 73  . Breast cancer Mother 68  . Hypertension Mother   . Osteoporosis Mother   . Heart disease Father        CABG  . Dementia Father   . Throat cancer Brother 102  . Breast cancer Sister 39  . Lymphoma Sister 44  . Melanoma Sister   . Skin cancer Brother   . Skin cancer Daughter     Social History   Socioeconomic History  . Marital status: Widowed    Spouse name: Not on file  . Number of children: 2  . Years of education: Not on file  . Highest education level: Not on file  Occupational History  . Not on file  Tobacco Use  . Smoking status: Never Smoker  . Smokeless tobacco: Never Used  Vaping Use  . Vaping Use: Never used  Substance and Sexual Activity  . Alcohol use: Yes    Comment: rarely  . Drug use: No  . Sexual activity: Not Currently    Partners: Male    Birth control/protection: Post-menopausal  Other Topics Concern  . Not on file  Social History Narrative  . Not on file   Social Determinants of Health   Financial Resource Strain: Not on file  Food Insecurity: Not on file  Transportation Needs: Not on file  Physical Activity: Not on file  Stress: Not on file  Social Connections: Not on file  Intimate Partner Violence: Not on file    Review of Systems  All other systems reviewed and are negative.   PHYSICAL EXAMINATION:    BP 140/76   Pulse 81   Ht 5' 8.5" (1.74 m)   Wt 149 lb (67.6 kg)   LMP 01/17/1982 (Approximate)   SpO2 99%   BMI 22.33 kg/m     General appearance: alert, cooperative and appears stated age   Pelvic: External genitalia:  no lesions, mild erythema              Urethra:  normal appearing urethra with no masses, tenderness or lesions              Bartholins and Skenes: normal                 Vagina: the pessary was removed and cleaned. The vagina is erythematous throughout with an increase in watery, white vaginal d/c with an odor. No irritation from the  pessary. The pessary was replaced.  Cervix: no lesions                Chaperone was present for exam.  Labs reviewed, creatinine from 05/14/20 was 1.10, GFR was 51  1. Pessary maintenance Overall doing well with the cube pessary  2. Acute vaginitis - WET PREP FOR TRICH, YEAST, CLUE  3. Bacterial vaginitis - metroNIDAZOLE (FLAGYL) 500 MG tablet; Take 1 tablet (500 mg total) by mouth 2 (two) times daily.  Dispense: 14 tablet; Refill: 0. No adjustment for GFR needed -It may be harder to treat with the pessary in, she will call if she isn't better in the next few weeks  4. Yeast vaginitis - fluconazole (DIFLUCAN) 150 MG tablet; Take 1 tablet (150 mg total) by mouth once for 1 dose. Take one tablet.  Repeat in 72 hours if symptoms are not completely resolved.  Dispense: 2 tablet; Refill: 0 No adjustment in diflucan for her GFR

## 2020-05-28 ENCOUNTER — Ambulatory Visit: Payer: Medicare Other

## 2020-05-28 ENCOUNTER — Other Ambulatory Visit: Payer: Self-pay

## 2020-05-28 ENCOUNTER — Other Ambulatory Visit: Payer: Self-pay | Admitting: *Deleted

## 2020-05-28 DIAGNOSIS — M25611 Stiffness of right shoulder, not elsewhere classified: Secondary | ICD-10-CM | POA: Diagnosis not present

## 2020-05-28 DIAGNOSIS — I972 Postmastectomy lymphedema syndrome: Secondary | ICD-10-CM | POA: Diagnosis not present

## 2020-05-28 DIAGNOSIS — M6281 Muscle weakness (generalized): Secondary | ICD-10-CM | POA: Diagnosis not present

## 2020-05-28 MED ORDER — LEVOFLOXACIN 500 MG PO TABS: 500.0000 mg | ORAL_TABLET | Freq: Every day | ORAL | 0 refills | Status: DC

## 2020-05-28 NOTE — Therapy (Signed)
[place], [place], [address] Phone: [phone]   Fax:  [phone]  Physical Therapy Treatment  Patient Details  Name: Shawna [name] MRN: [id number] Date of Birth: 09-May-1940 Referring Provider (PT): Dr. Lindi Adie   Encounter Date: 05/28/2020   PT End of Session - 05/28/20 1718    Visit Number 5    Number of Visits 9    Date for PT Re-Evaluation 06/11/20    PT Start Time 1604    PT Stop Time 1712    PT Time Calculation (min) 68 min    Activity Tolerance Patient tolerated treatment well    Behavior During Therapy [facility] for tasks assessed/performed           Past Medical History:  Diagnosis Date  . Breast cancer ([place]) 11/2014  . Family history of breast cancer   . Family history of colon cancer   . History of kidney stones   . History of radiation therapy 08/11/15- 09/23/2015   Right Breast  . History of radiation therapy 11/13/17- 12/29/17   Right chest wall and IM nodes 50.04 Gy in 28 fractions, Right supraclavicular and PAB nodes 50.04 Gy in 28 fractions, Right Chest wall boost/ 10 Gy in 5 fractions.   . Hyperlipidemia   . Hypertension   . Monoallelic mutation of NBN gene   . Osteoporosis   . Skin cancer of nose     Past Surgical History:  Procedure Laterality Date  . APPENDECTOMY    . BREAST LUMPECTOMY WITH RADIOACTIVE SEED AND SENTINEL LYMPH NODE BIOPSY Right 02/03/2015   Procedure: RIGHT BREAST LUMPECTOMY WITH RADIOACTIVE SEED AND RIGHT SENTINEL LYMPH NODE BIOPSY;  Surgeon: [name], MD;  Location: [place];  Service: General;  Laterality: Right;  . BREAST SURGERY  11/2012   biopsy, benign breast tissue  . COLONOSCOPY  06/2010   rec  . LITHOTRIPSY     kidney stone on left side  . MASTECTOMY W/ SENTINEL NODE BIOPSY Right 03/07/2017   Procedure: RIGHT TOTAL MASTECTOMY WITH RIGHT AXILLARY SENTINEL LYMPH NODE BIOPSY;  Surgeon: [name], MD;  Location: [place];   Service: General;  Laterality: Right;  . OVARIAN CYST REMOVAL  age 45  . POLYPECTOMY  04/2005   colon  . PORTACATH PLACEMENT Left 02/03/2015   Procedure: INSERTION PORT-A-CATH WITH ULTRA SOUND GUIDANCE;  Surgeon: [name], MD;  Location: [place];  Service: General;  Laterality: Left;  . reexcision  10/09/2017   Dr. Lucia Gaskins, chest wall excision for recurrent breast cancer.     There were no vitals filed for this visit.   Subjective Assessment - 05/28/20 1604    Subjective The wrap you put on last time stayed on well.  I spoke with Dr. Lindi Adie today by phone and he told her [place] looked good to him.  He asked about the redness at the swollen area in front of axilla.  He called me in Levaquin.  Started two new medicines for a yeast infection.    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  Past history includes bilateral total hip replacements, hyperlipidemia, hypertension and osteoporosis    Patient Stated Goals to get rid of the swelling    Currently in Pain? No/denies  [facility] PT Treatment/Exercise - 05/28/20 0001      Manual Therapy   Edema Management Small TG soft applied to right UE. orange foam applied to posterior forearm and held on with Large artiflex. Hand wrap with 6 cm wrap and 1 10 cm wrap wrist to axilla to decrease swelling was applied by PT.    Manual Lymphatic Drainage (MLD) MLD was performed to short neck, left axillary and right inguinal LN's, Anterior interaxillary anastomosis, Right axilloinguinal pathway, and right UE starting proximally and retracing steps and working distally to the hand.  Pt was instructed to all LN activation areas and pathways  and UE but required  frequent VC's and TC's                  PT Education - 05/28/20 1718    Education Details pt was educated in self MLD to LN's pathways and right UE    Person(s) Educated Patient    Methods Explanation;Demonstration;Handout;Verbal cues;Tactile cues    Comprehension Need further instruction;Verbal cues required;Tactile cues required               PT Long Term Goals - 05/22/20 1127      PT LONG TERM GOAL #1   Title Pt will have decrease in Right arm swelling at 10 cm prox to ulnar styloid by 1 cm    Baseline 21.7 on 05/12/2020    Time 4    Period Weeks    Status New      PT LONG TERM GOAL #2   Title Pt will report she knows lymphedema risk reduction practices and what to do to manage swelling    Time 4    Period Weeks    Status New      PT LONG TERM GOAL #3   Title Pt will be independent in a home exericse program for shoulder ROM and strength    Time 4    Period Weeks    Status New      PT LONG TERM GOAL #4   Title Pt will have 140 degrees of right shoulder flexion so that she can perform her activities easier    Baseline 125 on 05/12/2020    Time 4    Period Weeks    Status New      PT LONG TERM GOAL #5   Title Pt will be fit for compression sleeve and will be independent with donning and doffing    Time 4    Period Weeks    Status New                 Plan - 05/28/20 1719    Clinical Impression Statement Pt continues with good decrease in UE swelling but does have mild pitting at elbow today.  She will be measured for sleeve and tribute tomorrow.  MLD was performed by therapist first and then practiced all steps with pt performing and therapist providing verbal and tactile cueing prn.  Pt was greatly improved after practicing but we are having to be cautious secondary to pt. OA.    Personal Factors and Comorbidities Age;Comorbidity 3+    Comorbidities past breast cancer surgery x 3, radiation, bilateral THR    Examination-Activity Limitations Reach  Overhead    Examination-Participation Restrictions Yard Work    Stability/Clinical Decision Making Evolving/Moderate complexity    Rehab Potential Good    PT Frequency 2x / week    PT Duration 4 weeks    PT Treatment/Interventions  ADLs/Self Care Home Management;Therapeutic activities;Functional mobility training;Therapeutic exercise;Orthotic Fit/Training;Manual lymph drainage;Manual techniques;Patient/family education;Compression bandaging;Scar mobilization;Taping;Passive range of motion    PT Next Visit Plan add supine wand,recheck redness, swelling, continue MLD and instruct,, continue wrapping and add a wrap prn, instruct pt or son in wrapping, MFR to chest region, Chip pack to area of swelling ant lateral chest medial to arm.  See if pt got compression bra    Consulted and Agree with Plan of Care Patient           Patient will benefit from skilled therapeutic intervention in order to improve the following deficits and impairments:  Decreased range of motion,Decreased scar mobility,Increased edema,Postural dysfunction,Impaired UE functional use,Impaired flexibility,Increased fascial restricitons,Decreased strength,Pain,Decreased skin integrity  Visit Diagnosis: Postmastectomy lymphedema  Stiffness of right shoulder, not elsewhere classified  Muscle weakness (generalized)     Problem List Patient Active Problem List   Diagnosis Date Noted  . Pain in right knee 04/22/2019  . Trochanteric bursitis of right hip 07/06/2017  . 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 Monoallelic mutation of NBN gene   . Genetic testing 01/13/2015  . Family history of breast cancer   . Family history of colon cancer   . Breast cancer of lower-inner quadrant of right female breast ([place]) 12/30/2014    Claris Pong 05/28/2020, 5:30 PM  [place], [place], [address] Phone: [phone]   Fax:   [phone]  Name: Shawna [name] MRN: [id number] Date of Birth: September 23, 1940 Cheral Almas, PT 05/28/20 5:30 PM

## 2020-05-28 NOTE — Patient Instructions (Signed)
Pt was given handout for self MLD to right UE.  She will practice LN activation and pathways over the weekend

## 2020-05-28 NOTE — Progress Notes (Signed)
Received call from pt stating she has finished course of Augmentin on Friday 05/22/20 for right breast cellulitis.  Pt states no change in breast and is still experiencing redness, swelling, and pain.  Per MD pt to be prescribed Levaquin 500 mg p.o daily x14 days.  Pt educated on antibiotic and prescription sent to pharmacy on file.

## 2020-06-01 ENCOUNTER — Telehealth: Payer: Self-pay | Admitting: *Deleted

## 2020-06-01 NOTE — Telephone Encounter (Signed)
Patient was prescribed metronidazole 500 mg tablet at office visit on 05/27/20. Patient said she took 3 days worth of medication and c/o side effect such as nausea, bitter taste from pills, lost of appetite, issues with sleep. She asked if okay to stop the mediation? reports vagina area feels better,  Also she mentioned her oncologist Dr.Gudena has her taking levofloxacin 500 mg due to right arm lymphedema.

## 2020-06-02 ENCOUNTER — Other Ambulatory Visit: Payer: Self-pay

## 2020-06-02 ENCOUNTER — Ambulatory Visit: Payer: Medicare Other

## 2020-06-02 DIAGNOSIS — M25611 Stiffness of right shoulder, not elsewhere classified: Secondary | ICD-10-CM

## 2020-06-02 DIAGNOSIS — M6281 Muscle weakness (generalized): Secondary | ICD-10-CM

## 2020-06-02 DIAGNOSIS — I972 Postmastectomy lymphedema syndrome: Secondary | ICD-10-CM

## 2020-06-02 MED ORDER — METRONIDAZOLE 0.75 % VA GEL: 1.0000 | Freq: Every day | VAGINAL | 0 refills | Status: DC

## 2020-06-02 NOTE — Therapy (Signed)
[place], [place], [address] Phone: [phone]   Fax:  [phone]  Physical Therapy Treatment  Patient Details  Name: Shawna [name] MRN: [id number] Date of Birth: 08/12/1940 Referring Provider (PT): Dr. Lindi Adie   Encounter Date: 06/02/2020   PT End of Session - 06/02/20 0859    Visit Number 6    Number of Visits 9    Date for PT Re-Evaluation 06/11/20    PT Start Time 0804    PT Stop Time 0857    PT Time Calculation (min) 53 min    Activity Tolerance Patient tolerated treatment well    Behavior During Therapy [facility] for tasks assessed/performed           Past Medical History:  Diagnosis Date  . Breast cancer ([name]) 11/2014  . Family history of breast cancer   . Family history of colon cancer   . History of kidney stones   . History of radiation therapy 08/11/15- 09/23/2015   Right Breast  . History of radiation therapy 11/13/17- 12/29/17   Right chest wall and IM nodes 50.04 Gy in 28 fractions, Right supraclavicular and PAB nodes 50.04 Gy in 28 fractions, Right Chest wall boost/ 10 Gy in 5 fractions.   . Hyperlipidemia   . Hypertension   . Monoallelic mutation of NBN gene   . Osteoporosis   . Skin cancer of nose     Past Surgical History:  Procedure Laterality Date  . APPENDECTOMY    . BREAST LUMPECTOMY WITH RADIOACTIVE SEED AND SENTINEL LYMPH NODE BIOPSY Right 02/03/2015   Procedure: RIGHT BREAST LUMPECTOMY WITH RADIOACTIVE SEED AND RIGHT SENTINEL LYMPH NODE BIOPSY;  Surgeon: [name], MD;  Location: [place];  Service: General;  Laterality: Right;  . BREAST SURGERY  11/2012   biopsy, benign breast tissue  . COLONOSCOPY  06/2010   rec  . LITHOTRIPSY     kidney stone on left side  . MASTECTOMY W/ SENTINEL NODE BIOPSY Right 03/07/2017   Procedure: RIGHT TOTAL MASTECTOMY WITH RIGHT AXILLARY SENTINEL LYMPH NODE BIOPSY;  Surgeon: [name], MD;  Location: [place];   Service: General;  Laterality: Right;  . OVARIAN CYST REMOVAL  age 37  . POLYPECTOMY  04/2005   colon  . PORTACATH PLACEMENT Left 02/03/2015   Procedure: INSERTION PORT-A-CATH WITH ULTRA SOUND GUIDANCE;  Surgeon: [name], MD;  Location: [place];  Service: General;  Laterality: Left;  . reexcision  10/09/2017   Dr. Lucia Gaskins, chest wall excision for recurrent breast cancer.     There were no vitals filed for this visit.   Subjective Assessment - 06/02/20 0803    Subjective fGot measured for sleeve and tribute on Friday and was able to get donning butler as well. Still a little swelling in forearm.  Tried doing the MLD but I am not sure I am doing it right.    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  Past history includes bilateral total hip replacements, hyperlipidemia, hypertension and osteoporosis    Patient Stated Goals to get rid of the swelling    Currently in Pain? No/denies  [place] [name] Adult PT Treatment/Exercise - 06/02/20 0001      Manual Therapy   Edema Management Small TG soft applied to right UE. orange foam applied to posterior forearm and held on with Large artiflex. Hand wrap with 6 cm wrap and 1 10 cm wrap wrist to axilla to decrease swelling was applied by PT.    Manual Lymphatic Drainage (MLD) MLD was performed to short neck, left axillary and right inguinal LN's, Anterior interaxillary anastomosis, Right axilloinguinal pathway, and right UE starting proximally and retracing steps and working distally to the hand.  Pt was instructed to all LN activation areas and pathways  and UE but required intermittent VC's and TC's                  PT Education - 06/02/20 0858     Education Details Pt reminded to keep check on redness at the swollen area near arm pit and mild redness at upper arm and if anything is worse or gets painful to remove wrap. she has been on Levaquin for 5 days and mild redness is still present but does not appear any worse    Person(s) Educated Patient    Methods Explanation    Comprehension Verbalized understanding               PT Long Term Goals - 05/22/20 1127      PT LONG TERM GOAL #1   Title Pt will have decrease in Right arm swelling at 10 cm prox to ulnar styloid by 1 cm    Baseline 21.7 on 05/12/2020    Time 4    Period Weeks    Status New      PT LONG TERM GOAL #2   Title Pt will report she knows lymphedema risk reduction practices and what to do to manage swelling    Time 4    Period Weeks    Status New      PT LONG TERM GOAL #3   Title Pt will be independent in a home exericse program for shoulder ROM and strength    Time 4    Period Weeks    Status New      PT LONG TERM GOAL #4   Title Pt will have 140 degrees of right shoulder flexion so that she can perform her activities easier    Baseline 125 on 05/12/2020    Time 4    Period Weeks    Status New      PT LONG TERM GOAL #5   Title Pt will be fit for compression sleeve and will be independent with donning and doffing    Time 4    Period Weeks    Status New                 Plan - 06/02/20 0901    Clinical Impression Statement Very mild pitting remains at right elbow. Very mild redness is no worse but remains at swollen area near medial arm and upper arm slightly despite 5 days on Levaquin. Reviewed self MLD to LN's and pathways with pt improving but still requiring TC and VC's.  Overall swelling improved and pt is waiting on sleeve    Personal Factors and Comorbidities Age;Comorbidity 3+    Comorbidities past breast cancer surgery x 3, radiation, bilateral THR    Examination-Activity Limitations Reach Overhead    Examination-Participation  Restrictions Yard Work    Stability/Clinical Decision Making Evolving/Moderate complexity    Rehab Potential  Good    PT Frequency 2x / week    PT Duration 4 weeks    PT Treatment/Interventions ADLs/Self Care Home Management;Therapeutic activities;Functional mobility training;Therapeutic exercise;Orthotic Fit/Training;Manual lymph drainage;Manual techniques;Patient/family education;Compression bandaging;Scar mobilization;Taping;Passive range of motion    PT Next Visit Plan add supine wand,recheck redness, swelling, continue MLD and instruct,, continue wrapping and add a wrap prn, instruct pt or son in wrapping, MFR to chest region, Chip pack to area of swelling ant lateral chest medial to arm.  See if pt got compression bra    PT Home Exercise Plan practice MLD to LN's and pathways only    Consulted and Agree with Plan of Care Patient           Patient will benefit from skilled therapeutic intervention in order to improve the following deficits and impairments:  Decreased range of motion,Decreased scar mobility,Increased edema,Postural dysfunction,Impaired UE functional use,Impaired flexibility,Increased fascial restricitons,Decreased strength,Pain,Decreased skin integrity  Visit Diagnosis: Postmastectomy lymphedema  Stiffness of right shoulder, not elsewhere classified  Muscle weakness (generalized)     Problem List Patient Active Problem List   Diagnosis Date Noted  . Pain in right knee 04/22/2019  . Trochanteric bursitis of right hip 07/06/2017  . 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 Monoallelic mutation of NBN gene   . Genetic testing 01/13/2015  . Family history of breast cancer   . Family history of colon cancer   . Breast cancer of lower-inner quadrant of right female breast [facility]) 12/30/2014    Claris Pong 06/02/2020, 10:27 AM  [place], [place],  [address] Phone: [phone]   Fax:  [phone]  Name: ELEORA SUTHERLAND MRN: [id number] Date of Birth: 04/22/1940  Cheral Almas, PT 06/02/20 10:28 AM

## 2020-06-02 NOTE — Telephone Encounter (Signed)
She was diagnosed with BV at her last visit, she is likely only partially treated. The levofloxacin doesn't cover BV. I would recommend that she use metrogel, 1 applicator qhs x 5 days.

## 2020-06-02 NOTE — Telephone Encounter (Signed)
Patient informed with below note, Rx sent.

## 2020-06-03 ENCOUNTER — Telehealth: Payer: Self-pay | Admitting: *Deleted

## 2020-06-03 NOTE — Telephone Encounter (Signed)
Pt currently on Levaquin 500 mg p.o daily x14 days for tx of right breast cellulitis. Called with complaint of nausea, decreased appetite x 5 days.  Pt complaint of one episode of chills/shakes last night with no fever and negative home Covid test.  Pt states right breast cellulitis redness and irritation has improved.  Per MD pt to complete 7 days of Levaquin and then stop due to side effects.  Pt educated to call the office if cellulitis becomes worse. Pt verbalized understanding and appreciative of advice.

## 2020-06-04 ENCOUNTER — Other Ambulatory Visit: Payer: Self-pay

## 2020-06-04 ENCOUNTER — Ambulatory Visit: Payer: Medicare Other

## 2020-06-04 DIAGNOSIS — M6281 Muscle weakness (generalized): Secondary | ICD-10-CM

## 2020-06-04 DIAGNOSIS — I972 Postmastectomy lymphedema syndrome: Secondary | ICD-10-CM

## 2020-06-04 DIAGNOSIS — M25611 Stiffness of right shoulder, not elsewhere classified: Secondary | ICD-10-CM | POA: Diagnosis not present

## 2020-06-04 NOTE — Therapy (Signed)
[place], [place], [address] Phone: [phone]   Fax:  [phone]  Physical Therapy Treatment  Patient Details  Name: Shawna [name] MRN: [id number] Date of Birth: 78-25-1942 Referring Provider (PT): Dr. Lindi Adie   Encounter Date: 06/04/2020   PT End of Session - 06/04/20 1612    Visit Number 7    Number of Visits 9    Date for PT Re-Evaluation 06/11/20    PT Start Time 0301    PT Stop Time 0359    PT Time Calculation (min) 58 min    Activity Tolerance Patient tolerated treatment well    Behavior During Therapy [facility] for tasks assessed/performed           Past Medical History:  Diagnosis Date  . Breast cancer ([place]) 11/2014  . Family history of breast cancer   . Family history of colon cancer   . History of kidney stones   . History of radiation therapy 08/11/15- 09/23/2015   Right Breast  . History of radiation therapy 11/13/17- 12/29/17   Right chest wall and IM nodes 50.04 Gy in 28 fractions, Right supraclavicular and PAB nodes 50.04 Gy in 28 fractions, Right Chest wall boost/ 10 Gy in 5 fractions.   . Hyperlipidemia   . Hypertension   . Monoallelic mutation of NBN gene   . Osteoporosis   . Skin cancer of nose     Past Surgical History:  Procedure Laterality Date  . APPENDECTOMY    . BREAST LUMPECTOMY WITH RADIOACTIVE SEED AND SENTINEL LYMPH NODE BIOPSY Right 02/03/2015   Procedure: RIGHT BREAST LUMPECTOMY WITH RADIOACTIVE SEED AND RIGHT SENTINEL LYMPH NODE BIOPSY;  Surgeon: [name], MD;  Location: [facility];  Service: General;  Laterality: Right;  . BREAST SURGERY  11/2012   biopsy, benign breast tissue  . COLONOSCOPY  06/2010   rec  . LITHOTRIPSY     kidney stone on left side  . MASTECTOMY W/ SENTINEL NODE BIOPSY Right 03/07/2017   Procedure: RIGHT TOTAL MASTECTOMY WITH RIGHT AXILLARY SENTINEL LYMPH NODE BIOPSY;  Surgeon: [name], MD;  Location: [facility];   Service: General;  Laterality: Right;  . OVARIAN CYST REMOVAL  age 80  . POLYPECTOMY  04/2005   colon  . PORTACATH PLACEMENT Left 02/03/2015   Procedure: INSERTION PORT-A-CATH WITH ULTRA SOUND GUIDANCE;  Surgeon: [name], MD;  Location: [facility];  Service: General;  Laterality: Left;  . reexcision  10/09/2017   Dr. Lucia Gaskins, chest wall excision for recurrent breast cancer.     There were no vitals filed for this visit.   Subjective Assessment - 06/04/20 1504    Subjective I think the arm is better.  I got the arm butler delivered yesterday but no sleeve yet. My arm is doing well.  Arm is still a little pink but I think its better.  I am not feeling well and don't want to eat.  I really think its the antibiotics I am on. I called Dr. 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  Past history includes bilateral total hip replacements, hyperlipidemia, hypertension and osteoporosis    Patient Stated Goals to get rid of the swelling    Currently  in Pain? No/denies                 LYMPHEDEMA/ONCOLOGY QUESTIONNAIRE - 06/04/20 0001      Right Upper Extremity Lymphedema   15 cm Proximal to Olecranon Process 26.3 cm    10 cm Proximal to Olecranon Process 26.3 cm    Olecranon Process 23.4 cm    15 cm Proximal to Ulnar Styloid Process 19.8 cm    10 cm Proximal to Ulnar Styloid Process 16.7 cm    Just Proximal to Ulnar Styloid Process 14.7 cm    Across Hand at [employer] 17.8 cm    At [place] of 2nd Digit 5.8 cm                      [facility] Adult PT Treatment/Exercise - 06/04/20 0001      Manual Therapy   Edema Management Small TG soft applied to right UE. orange foam  applied to posterior forearm and held on with Large artiflex. Hand wrap with 6 cm wrap and 1 10 cm wrap wrist to axilla to decrease swelling was applied by PT.    Manual Lymphatic Drainage (MLD) MLD was performed to short neck, left axillary and right inguinal LN's, 5 diaphragmatic breaths, Anterior interaxillary anastomosis, Right axilloinguinal pathway, and right UE starting proximally and retracing steps and working distally to the hand.  Pt was instructed to all LN activation areas and pathways  and UE and  required occasional VC's but was greatly improved.                      PT Long Term Goals - 05/22/20 1127      PT LONG TERM GOAL #1   Title Pt will have decrease in Right arm swelling at 10 cm prox to ulnar styloid by 1 cm    Baseline 21.7 on 05/12/2020    Time 4    Period Weeks    Status Achieved     PT LONG TERM GOAL #2   Title Pt will report she knows lymphedema risk reduction practices and what to do to manage swelling    Time 4    Period Weeks    Status New      PT LONG TERM GOAL #3   Title Pt will be independent in a home exericse program for shoulder ROM and strength    Time 4    Period Weeks    Status New      PT LONG TERM GOAL #4   Title Pt will have 140 degrees of right shoulder flexion so that she can perform her activities easier    Baseline 125 on 05/12/2020    Time 4    Period Weeks    Status New      PT LONG TERM GOAL #5   Title Pt will be fit for compression sleeve and will be independent with donning and doffing    Time 4    Period Weeks    Status New                 Plan - 06/04/20 1631    Clinical Impression Statement Pt was remeasured today with excellent results throughout the Right UE.  She showed excellent improvement in the performance of her MLD today. She received her donning butler today but has not received her sleeve yet. She is ready to transition to the sleeve when it arrives    Personal  Factors and Comorbidities  Age;Comorbidity 3+    Comorbidities past breast cancer surgery x 3, radiation, bilateral THR    Examination-Activity Limitations Reach Overhead    Stability/Clinical Decision Making Stable/Uncomplicated    Rehab Potential Good    PT Frequency 2x / week    PT Duration 4 weeks    PT Treatment/Interventions ADLs/Self Care Home Management;Therapeutic activities;Functional mobility training;Therapeutic exercise;Orthotic Fit/Training;Manual lymph drainage;Manual techniques;Patient/family education;Compression bandaging;Scar mobilization;Taping;Passive range of motion    PT Next Visit Plan add supine wand,recheck redness, swelling, continue MLD and instruct,, continue wrapping ,, MFR to chest region, Chip pack to area of swelling ant lateral chest medial to arm., assess sleeve/gauntlet tribute when she receives.    PT Home Exercise Plan MLD to right UE    Consulted and Agree with Plan of Care Patient           Patient will benefit from skilled therapeutic intervention in order to improve the following deficits and impairments:  Decreased range of motion,Decreased scar mobility,Increased edema,Postural dysfunction,Impaired UE functional use,Impaired flexibility,Increased fascial restricitons,Decreased strength,Pain,Decreased skin integrity  Visit Diagnosis: Postmastectomy lymphedema  Stiffness of right shoulder, not elsewhere classified  Muscle weakness (generalized)     Problem List Patient Active Problem List   Diagnosis Date Noted  . Pain in right knee 04/22/2019  . Trochanteric bursitis of right hip 07/06/2017  . 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 Monoallelic mutation of NBN gene   . Genetic testing 01/13/2015  . Family history of breast cancer   . Family history of colon cancer   . Breast cancer of lower-inner quadrant of right female breast ([place]) 12/30/2014    Claris Pong 06/04/2020, 5:14 PM  [place], [place], [address] Phone: [phone]   Fax:  [phone]  Name: MERLINE PERKIN MRN: [id number] Date of Birth: 11-Feb-1940  Cheral Almas, PT 06/04/20 5:17 PM

## 2020-06-10 ENCOUNTER — Other Ambulatory Visit: Payer: Self-pay

## 2020-06-10 ENCOUNTER — Ambulatory Visit: Payer: Medicare Other

## 2020-06-10 DIAGNOSIS — I972 Postmastectomy lymphedema syndrome: Secondary | ICD-10-CM | POA: Diagnosis not present

## 2020-06-10 DIAGNOSIS — M25611 Stiffness of right shoulder, not elsewhere classified: Secondary | ICD-10-CM

## 2020-06-10 DIAGNOSIS — M6281 Muscle weakness (generalized): Secondary | ICD-10-CM | POA: Diagnosis not present

## 2020-06-10 NOTE — Patient Instructions (Signed)
SHOULDER: Flexion - Supine (Cane)        Cancer Rehab [phone]    Hold cane in both hands. Raise arms up overhead. Do not allow back to arch. Hold _5__ seconds. Do __5-10__ times; __1-2__ times a day. 1. Hands shoulder width apart  2. Hands lightly wider than shoulder width;V position    Shoulder Blade Stretch    Clasp fingers behind head with elbows touching in front of face. Pull elbows back while pressing shoulder blades together. Relax and hold as tolerated, can place pillow under elbow here for comfort as needed and to allow for prolonged stretch.  Repeat __5__ times. Do __1-2__ sessions per day.       Copyright  VHI. All rights reserved.

## 2020-06-10 NOTE — Therapy (Signed)
[place], [place], [address] Phone: [phone]   Fax:  [phone]  Physical Therapy Treatment  Patient Details  Name: Shawna [name] MRN: [id number] Date of Birth: October 06, 1940 Referring Provider (PT): Dr. Lindi Adie   Encounter Date: 06/10/2020   PT End of Session - 06/10/20 1738    Visit Number 8    Number of Visits 16    Date for PT Re-Evaluation 07/09/20    PT Start Time 7588    PT Stop Time 1457    PT Time Calculation (min) 59 min    Activity Tolerance Patient tolerated treatment well    Behavior During Therapy [facility] for tasks assessed/performed           Past Medical History:  Diagnosis Date  . Breast cancer ([place]) 11/2014  . Family history of breast cancer   . Family history of colon cancer   . History of kidney stones   . History of radiation therapy 08/11/15- 09/23/2015   Right Breast  . History of radiation therapy 11/13/17- 12/29/17   Right chest wall and IM nodes 50.04 Gy in 28 fractions, Right supraclavicular and PAB nodes 50.04 Gy in 28 fractions, Right Chest wall boost/ 10 Gy in 5 fractions.   . Hyperlipidemia   . Hypertension   . Monoallelic mutation of NBN gene   . Osteoporosis   . Skin cancer of nose     Past Surgical History:  Procedure Laterality Date  . APPENDECTOMY    . BREAST LUMPECTOMY WITH RADIOACTIVE SEED AND SENTINEL LYMPH NODE BIOPSY Right 02/03/2015   Procedure: RIGHT BREAST LUMPECTOMY WITH RADIOACTIVE SEED AND RIGHT SENTINEL LYMPH NODE BIOPSY;  Surgeon: [name], MD;  Location: [facility];  Service: General;  Laterality: Right;  . BREAST SURGERY  11/2012   biopsy, benign breast tissue  . COLONOSCOPY  06/2010   rec  . LITHOTRIPSY     kidney stone on left side  . MASTECTOMY W/ SENTINEL NODE BIOPSY Right 03/07/2017   Procedure: RIGHT TOTAL MASTECTOMY WITH RIGHT AXILLARY SENTINEL LYMPH NODE BIOPSY;  Surgeon: [name], MD;  Location: [facility];   Service: General;  Laterality: Right;  . OVARIAN CYST REMOVAL  age 20  . POLYPECTOMY  04/2005   colon  . PORTACATH PLACEMENT Left 02/03/2015   Procedure: INSERTION PORT-A-CATH WITH ULTRA SOUND GUIDANCE;  Surgeon: [name], MD;  Location: [facility];  Service: General;  Laterality: Left;  . reexcision  10/09/2017   Dr. Lucia Gaskins, chest wall excision for recurrent breast cancer.     There were no vitals filed for this visit.   Subjective Assessment - 06/10/20 1406    Subjective Trying to do the MLD at home but I don't do it really well.  I am getting my appetite back now that the antibiotics are over. I still haven't recieved my sleeve yet and I go to the beach the week after next.    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  Past history includes bilateral total hip replacements, hyperlipidemia, hypertension and osteoporosis    Patient Stated Goals to get rid of the swelling    Currently in Pain? No/denies  [place] [place] Adult PT Treatment/Exercise - 06/10/20 0001      Shoulder Exercises: Supine   Other Supine Exercises supine wand flex and scaption, stargazer stretch      Manual Therapy   Edema Management Small TG soft applied to right UE. orange foam applied to posterior forearm and held on with Large artiflex. Hand wrap with 6 cm wrap and 1 10 cm wrap wrist to axilla to decrease swelling was applied by PT.    Myofascial Release MFR to right chest area    Manual Lymphatic Drainage (MLD) MLD was performed to short neck, left axillary and right inguinal LN's, Anterior interaxillary anastomosis, Right axilloinguinal pathway, and right UE starting proximally and retracing steps and working distally  to the hand.  Pt was instructed to all LN activation areas and pathways  and UE but required intermittent VC's and TC's                  PT Education - 06/10/20 1737    Education Details Pt was educated on supine wand flexion and scaption and stargazer stretch to improve shoulder ROM/chest tightness    Person(s) Educated Patient    Methods Explanation    Comprehension Returned demonstration;Verbalized understanding               PT Long Term Goals - 06/10/20 1744      PT LONG TERM GOAL #1   Title Pt will have decrease in Right arm swelling at 10 cm prox to ulnar styloid by 1 cm    Baseline 21.7 on 05/12/2020    Time 4    Period Weeks    Status Achieved      PT LONG TERM GOAL #2   Title Pt will report she knows lymphedema risk reduction practices and what to do to manage swelling    Time 4    Period Weeks    Status On-going    Target Date 07/09/20      PT LONG TERM GOAL #3   Title Pt will be independent in a home exericse program for shoulder ROM and strength    Time 4    Period Weeks    Status On-going    Target Date 07/09/20      PT LONG TERM GOAL #4   Title Pt will have 140 degrees of right shoulder flexion so that she can perform her activities easier    Baseline 125 on 05/12/2020    Time 4    Period Weeks    Status On-going    Target Date 07/09/20      PT LONG TERM GOAL #5   Title Pt will be fit for compression sleeve and will be independent with donning and doffing    Baseline fit but not yet received    Time 4    Period Weeks    Status On-going                 Plan - 06/10/20 1740    Clinical Impression Statement pt did well with supine wand exs and shoulder ROM was noted to improve as she did it.  Her arm looks excellent and there is no pitting edema today.  There is mild redness at the medial upper arm and the swollen area medial to the axilla, but no worse and not painful.  MLD was performed and did seem to reduce and soften the area  medial to axilla. Pt is compliant with self MLD.  She is still awaiting  her sleeve.  Lynett Fish today to see if she knew anything about it.    Personal Factors and Comorbidities Age;Comorbidity 3+    Comorbidities past breast cancer surgery x 3, radiation, bilateral THR    Examination-Activity Limitations Reach Overhead    Examination-Participation Restrictions Yard Work    Stability/Clinical Decision Making Stable/Uncomplicated    Rehab Potential Good    PT Frequency 2x / week    PT Duration 4 weeks    PT Treatment/Interventions ADLs/Self Care Home Management;Therapeutic activities;Functional mobility training;Therapeutic exercise;Orthotic Fit/Training;Manual lymph drainage;Manual techniques;Patient/family education;Compression bandaging;Scar mobilization;Taping;Passive range of motion    PT Next Visit Plan add supine wand,recheck redness, swelling, continue MLD and instruct,, continue wrapping ,, MFR to chest region, Chip pack to area of swelling ant lateral chest medial to arm., assess sleeve/gauntlet tribute when she receives.    PT Home Exercise Plan MLD to right UE    Consulted and Agree with Plan of Care Patient           Patient will benefit from skilled therapeutic intervention in order to improve the following deficits and impairments:  Decreased range of motion,Decreased scar mobility,Increased edema,Postural dysfunction,Impaired UE functional use,Impaired flexibility,Increased fascial restricitons,Decreased strength,Pain,Decreased skin integrity  Visit Diagnosis: Postmastectomy lymphedema  Stiffness of right shoulder, not elsewhere classified  Muscle weakness (generalized)     Problem List Patient Active Problem List   Diagnosis Date Noted  . Pain in right knee 04/22/2019  . Trochanteric bursitis of right hip 07/06/2017  . 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 Monoallelic mutation of NBN gene   . Genetic testing 01/13/2015  . Family history  of breast cancer   . Family history of colon cancer   . Breast cancer of lower-inner quadrant of right female breast [facility]) 12/30/2014    Claris Pong 06/10/2020, 5:48 PM  [place], [place], [address] Phone: [phone]   Fax:  [phone]  Name: HAGAR SADIQ MRN: [id number] Date of Birth: 08/08/1940  Cheral Almas, PT 06/10/20 5:48 PM

## 2020-06-11 ENCOUNTER — Ambulatory Visit: Payer: Medicare Other

## 2020-06-11 DIAGNOSIS — M25611 Stiffness of right shoulder, not elsewhere classified: Secondary | ICD-10-CM | POA: Diagnosis not present

## 2020-06-11 DIAGNOSIS — I972 Postmastectomy lymphedema syndrome: Secondary | ICD-10-CM | POA: Diagnosis not present

## 2020-06-11 DIAGNOSIS — M6281 Muscle weakness (generalized): Secondary | ICD-10-CM

## 2020-06-11 NOTE — Therapy (Signed)
[place], [place], [address] Phone: [phone]   Fax:  [phone]  Physical Therapy Treatment  Patient Details  Name: Shawna [name] MRN: [id number] Date of Birth: [birth year] Referring Provider (PT): Dr. Lindi Adie   Encounter Date: 06/11/2020   PT End of Session - 06/11/20 1357    Visit Number 9    Number of Visits 16    Date for PT Re-Evaluation 07/09/20    PT Start Time 1300    PT Stop Time 1350    PT Time Calculation (min) 50 min    Activity Tolerance Patient tolerated treatment well    Behavior During Therapy [facility] for tasks assessed/performed           Past Medical History:  Diagnosis Date  . Breast cancer ([place]) 11/2014  . Family history of breast cancer   . Family history of colon cancer   . History of kidney stones   . History of radiation therapy 08/11/15- 09/23/2015   Right Breast  . History of radiation therapy 11/13/17- 12/29/17   Right chest wall and IM nodes 50.04 Gy in 28 fractions, Right supraclavicular and PAB nodes 50.04 Gy in 28 fractions, Right Chest wall boost/ 10 Gy in 5 fractions.   . Hyperlipidemia   . Hypertension   . Monoallelic mutation of NBN gene   . Osteoporosis   . Skin cancer of nose     Past Surgical History:  Procedure Laterality Date  . APPENDECTOMY    . BREAST LUMPECTOMY WITH RADIOACTIVE SEED AND SENTINEL LYMPH NODE BIOPSY Right 02/03/2015   Procedure: RIGHT BREAST LUMPECTOMY WITH RADIOACTIVE SEED AND RIGHT SENTINEL LYMPH NODE BIOPSY;  Surgeon: [name], MD;  Location: [place];  Service: General;  Laterality: Right;  . BREAST SURGERY  11/2012   biopsy, benign breast tissue  . COLONOSCOPY  06/2010   rec  . LITHOTRIPSY     kidney stone on left side  . MASTECTOMY W/ SENTINEL NODE BIOPSY Right 03/07/2017   Procedure: RIGHT TOTAL MASTECTOMY WITH RIGHT AXILLARY SENTINEL LYMPH NODE BIOPSY;  Surgeon: [name], MD;  Location: [place];   Service: General;  Laterality: Right;  . OVARIAN CYST REMOVAL  age 22  . POLYPECTOMY  04/2005   colon  . PORTACATH PLACEMENT Left 02/03/2015   Procedure: INSERTION PORT-A-CATH WITH ULTRA SOUND GUIDANCE;  Surgeon: [name], MD;  Location: [place];  Service: General;  Laterality: Left;  . reexcision  10/09/2017   Dr. Lucia Gaskins, chest wall excision for recurrent breast cancer.     There were no vitals filed for this visit.   Subjective Assessment - 06/11/20 1301    Subjective My sleeve arrived last night.  I am still waiting for the night time garment to arrive.    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  Past history includes bilateral total hip replacements, hyperlipidemia, hypertension and osteoporosis    Patient Stated Goals to get rid of the swelling    Currently in Pain? No/denies                             [facility] Adult PT Treatment/Exercise - 06/11/20 0001      Manual Therapy   Manual therapy comments Pt.  brought her arm butler and sleeve.  We practiced donning 3times using butler and using donning gloves.  She did very well after the initial trial and was able to do independently.  We discussed her night time garment and how to don.  We also discussed laundering of sleeve, and proper wear times    Manual Lymphatic Drainage (MLD) reviewed self manual lymph drainage.  Pt did well but still requires VC's and TC's for sequence and pressure.                  PT Education - 06/11/20 1356    Education Details pt was educated how to use her arm butler to don her compression sleeve and how to use the wall, or table to stabilize her arm if she needed to once whe removed her arm from the [facility].  Showed her  how donning gloves work and she did very well.  Also reviewed self MLD    Person(s) Educated Patient    Methods Explanation;Demonstration    Comprehension Verbalized understanding;Returned demonstration               PT Long Term Goals - 06/10/20 1744      PT LONG TERM GOAL #1   Title Pt will have decrease in Right arm swelling at 10 cm prox to ulnar styloid by 1 cm    Baseline 21.7 on 05/12/2020    Time 4    Period Weeks    Status Achieved      PT LONG TERM GOAL #2   Title Pt will report she knows lymphedema risk reduction practices and what to do to manage swelling    Time 4    Period Weeks    Status On-going    Target Date 07/09/20      PT LONG TERM GOAL #3   Title Pt will be independent in a home exericse program for shoulder ROM and strength    Time 4    Period Weeks    Status On-going    Target Date 07/09/20      PT LONG TERM GOAL #4   Title Pt will have 140 degrees of right shoulder flexion so that she can perform her activities easier    Baseline 125 on 05/12/2020    Time 4    Period Weeks    Status On-going    Target Date 07/09/20      PT LONG TERM GOAL #5   Title Pt will be fit for compression sleeve and will be independent with donning and doffing    Baseline fit but not yet received    Time 4    Period Weeks    Status On-going                 Plan - 06/11/20 1358    Clinical Impression Statement Pt received her sleeve last night and she brought her butler and sleeve to have checked and to learn how to don.  She did very well after practicing several times, and did get benefit from gloves to complete donning.  We also reviewed self MLD.  She is improving but still needs VC's and TC's for sequence and pressure.  She may receive her night time garment tonight and she will call me so I can talk her through it if she does. We discussed care of sleeve and checking her hand to be sure it doesn't swell as her hand piece has not arrived yet.    Personal  Factors and Comorbidities Age;Comorbidity 3+    Comorbidities past breast cancer surgery x 3, radiation, bilateral THR    Examination-Activity Limitations Reach Overhead    Examination-Participation Restrictions Yard Work    Stability/Clinical Decision Making Stable/Uncomplicated    Rehab Potential Good    PT Frequency 2x / week    PT Duration 4 weeks    PT Treatment/Interventions ADLs/Self Care Home Management;Therapeutic activities;Functional mobility training;Therapeutic exercise;Orthotic Fit/Training;Manual lymph drainage;Manual techniques;Patient/family education;Compression bandaging;Scar mobilization;Taping;Passive range of motion    PT Next Visit Plan check night time garment, check sleeve and hand piece, reassess redness, bra?    PT Home Exercise Plan MLD to right UE    Consulted and Agree with Plan of Care Patient           Patient will benefit from skilled therapeutic intervention in order to improve the following deficits and impairments:  Decreased range of motion,Decreased scar mobility,Increased edema,Postural dysfunction,Impaired UE functional use,Impaired flexibility,Increased fascial restricitons,Decreased strength,Pain,Decreased skin integrity  Visit Diagnosis: Postmastectomy lymphedema  Stiffness of right shoulder, not elsewhere classified  Muscle weakness (generalized)     Problem List Patient Active Problem List   Diagnosis Date Noted  . Pain in right knee 04/22/2019  . Trochanteric bursitis of right hip 07/06/2017  . 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 Monoallelic mutation of NBN gene   . Genetic testing 01/13/2015  . Family history of breast cancer   . Family history of colon cancer   . Breast cancer of lower-inner quadrant of right female breast [facility]) 12/30/2014    Claris Pong 06/11/2020, 7:39 PM  [place], [place], [address] Phone:  [phone]   Fax:  [phone]  Name: SHALEE PAOLO MRN: [id number] Date of Birth: 06/04/1940  Cheral Almas, PT 06/11/20 7:42 PM

## 2020-06-17 ENCOUNTER — Ambulatory Visit: Payer: Medicare Other | Admitting: Podiatry

## 2020-06-17 ENCOUNTER — Other Ambulatory Visit: Payer: Self-pay

## 2020-06-17 ENCOUNTER — Encounter: Payer: Self-pay | Admitting: Podiatry

## 2020-06-17 DIAGNOSIS — M79675 Pain in left toe(s): Secondary | ICD-10-CM

## 2020-06-17 DIAGNOSIS — B351 Tinea unguium: Secondary | ICD-10-CM

## 2020-06-17 DIAGNOSIS — M79674 Pain in right toe(s): Secondary | ICD-10-CM

## 2020-06-17 DIAGNOSIS — L84 Corns and callosities: Secondary | ICD-10-CM

## 2020-06-17 NOTE — Progress Notes (Signed)
This patient returns to the office for evaluation and treatment of long thick painful nails and painful callus. .  This patient is unable to trim her own nails since the patient cannot reach her feet.  Patient says the nails are painful walking and wearing his shoes.  She also has severely thick callus on bottom of left forefoot. which is painful walking. She returns for preventive foot care services.  General Appearance  Alert, conversant and in no acute stress.  Vascular  Dorsalis pedis and posterior tibial  pulses are palpable  bilaterally.  Capillary return is within normal limits  bilaterally. Temperature is within normal limits  bilaterally.  Neurologic  Senn-Weinstein monofilament wire test within normal limits  bilaterally. Muscle power within normal limits bilaterally.  Nails Thick disfigured discolored nails with subungual debris  hallux nails  bilaterally. No evidence of bacterial infection or drainage bilaterally.  Orthopedic  No limitations of motion  feet .  No crepitus or effusions noted.  No bony pathology or digital deformities noted.  Skin  normotropic skin with no porokeratosis noted bilaterally.  No signs of infections or ulcers noted.   Callus sub 1.5 left forefoot.  Onychomycosis  Pain in toes right foot  Pain in toes left foot  Callus left forefoot.  Debridement  of nails  1-5  B/L with a nail nipper.  Nails were then filed using a dremel tool with no incidents. Debride callus with # 15 blade  Followed by dremel tool usage. RTC  prn   Gardiner Barefoot DPM

## 2020-07-03 ENCOUNTER — Other Ambulatory Visit: Payer: Self-pay

## 2020-07-03 ENCOUNTER — Ambulatory Visit: Payer: Medicare Other | Attending: Hematology and Oncology

## 2020-07-03 DIAGNOSIS — M6281 Muscle weakness (generalized): Secondary | ICD-10-CM | POA: Insufficient documentation

## 2020-07-03 DIAGNOSIS — I972 Postmastectomy lymphedema syndrome: Secondary | ICD-10-CM

## 2020-07-03 DIAGNOSIS — M25611 Stiffness of right shoulder, not elsewhere classified: Secondary | ICD-10-CM

## 2020-07-03 NOTE — Therapy (Signed)
[place], [place], [address] Phone: [phone]   Fax:  [phone]  Physical Therapy Treatment  Patient Details  Name: Shawna [name] MRN: [id number] Date of Birth: April 03, 1940 Referring Provider (PT): Dr. Lindi Adie   Encounter Date: 07/03/2020   PT End of Session - 07/03/20 1308     Visit Number 10    Number of Visits 16    Date for PT Re-Evaluation 08/13/20    PT Start Time 2458    PT Stop Time 0998    PT Time Calculation (min) 76 min    Activity Tolerance Patient tolerated treatment well    Behavior During Therapy [facility] for tasks assessed/performed             Past Medical History:  Diagnosis Date   Breast cancer ([place]) 11/2014   Family history of breast cancer    Family history of colon cancer    History of kidney stones    History of radiation therapy 08/11/15- 09/23/2015   Right Breast   History of radiation therapy 11/13/17- 12/29/17   Right chest wall and IM nodes 50.04 Gy in 28 fractions, Right supraclavicular and PAB nodes 50.04 Gy in 28 fractions, Right Chest wall boost/ 10 Gy in 5 fractions.    Hyperlipidemia    Hypertension    Monoallelic mutation of NBN gene    Osteoporosis    Skin cancer of nose     Past Surgical History:  Procedure Laterality Date   APPENDECTOMY     BREAST LUMPECTOMY WITH RADIOACTIVE SEED AND SENTINEL LYMPH NODE BIOPSY Right 02/03/2015   Procedure: RIGHT BREAST LUMPECTOMY WITH RADIOACTIVE SEED AND RIGHT SENTINEL LYMPH NODE BIOPSY;  Surgeon: [name], MD;  Location: [place];  Service: General;  Laterality: Right;   BREAST SURGERY  11/2012   biopsy, benign breast tissue   COLONOSCOPY  06/2010   rec   LITHOTRIPSY     kidney stone on left side   MASTECTOMY W/ SENTINEL NODE BIOPSY Right 03/07/2017   Procedure: RIGHT TOTAL MASTECTOMY WITH RIGHT AXILLARY SENTINEL LYMPH NODE BIOPSY;  Surgeon: [name], MD;  Location: [place];  Service:  General;  Laterality: Right;   OVARIAN CYST REMOVAL  age 38   POLYPECTOMY  04/2005   colon   PORTACATH PLACEMENT Left 02/03/2015   Procedure: INSERTION PORT-A-CATH WITH ULTRA SOUND GUIDANCE;  Surgeon: [name], MD;  Location: [facility];  Service: General;  Laterality: Left;   reexcision  10/09/2017   Dr. Lucia Gaskins, chest wall excision for recurrent breast cancer.     There were no vitals filed for this visit.   Subjective Assessment - 07/03/20 1149     Subjective Had Rob wrap 1 night but my hand got really swollen.  I have been wearing the sleeve every day.  I have done the MLD a little. I haven't tried on the night wrap;it looks complicated.     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  Past history includes bilateral total hip replacements, hyperlipidemia, hypertension and osteoporosis    Patient Stated Goals to get rid of the swelling    Currently in Pain? No/denies                   LYMPHEDEMA/ONCOLOGY QUESTIONNAIRE - 07/03/20 0001  Right Upper Extremity Lymphedema   15 cm Proximal to Olecranon Process 27 cm    10 cm Proximal to Olecranon Process 26.4 cm    Olecranon Process 23.7 cm    15 cm Proximal to Ulnar Styloid Process 20 cm    10 cm Proximal to Ulnar Styloid Process 16.7 cm    Just Proximal to Ulnar Styloid Process 14.6 cm    At Base of 2nd Digit 6 cm      Left Upper Extremity Lymphedema   15 cm Proximal to Ulnar Styloid Process 20.1 cm    10 cm Proximal to Ulnar Styloid Process 17.1 cm    At Base of 2nd Digit 5.7 cm                        [facility] Adult PT Treatment/Exercise - 07/03/20 0001       Manual Therapy   Edema Management Pt brought her night tribute.  She was shown how to  don and we discussed laundering instructions.  We discussed warning signs of infection, and lymphedema precautions,  She verbalized understanding . Chip pack was made for area of swelling medial to axilla.  We discussed possible ways she may be able to hold it in.  Pt was remeasured with excellent reduction still noted.   Manual Lymphatic Drainage (MLD) reviewed self manual lymph drainage in sitting with PT demonstrating and pt repeating.  Pt did well but still requires VC's and TC's for sequence and pressure.                    PT Education - 07/03/20 1307     Education Details We reviewed self MLD again, and pt was educated in how to don her night time tribute.    Person(s) Educated Patient    Methods Demonstration;Explanation    Comprehension Verbalized understanding;Returned demonstration;Other (comment)   occasional assist needed with strong velcro                PT Long Term Goals - 07/03/20 1316       PT LONG TERM GOAL #1   Title Pt will have decrease in Right arm swelling at 10 cm prox to ulnar styloid by 1 cm    Time 4    Period Weeks    Status Achieved      PT LONG TERM GOAL #2   Title Pt will report she knows lymphedema risk reduction practices and what to do to manage swelling    Time 4    Period Weeks    Status Achieved      PT LONG TERM GOAL #3   Title Pt will be independent in a home exericse program for shoulder ROM and strength    Baseline has not been doing    Time 4    Period Weeks    Status On-going      PT LONG TERM GOAL #4   Title Pt will have 140 degrees of right shoulder flexion so that she can perform her activities easier    Baseline 128  07/03/20    Time 4    Period Weeks    Status On-going      PT LONG TERM GOAL #5   Title Pt will be fit for compression sleeve and will be independent with donning and doffing    Time 4    Period Weeks    Status Achieved  Plan - 07/03/20 1309     Clinical  Impression Statement Pt was seen for reassessment today.  She has been independent and compliant with wearing her compression sleeve and just received her night tribute.  She was remeasured and has maintained very good reduction, but does have some mild pitting at the posterior elbow.  She was educated how to don her night time tribute and did struggle a bit with the velcro on the garment.  She may need some assist from her son initially. We reviewed self MLD and she did well but required verbal and tactile cues for sequence and technique. I made a chip pack to try and keep in her bra to decrease the swelling medial to her axilla.  We discussed different ways she could try to hold it in. We will follow up as needed over the next 6 weeks and pt will call if she feels she needs to return. If I have not heard from her I will DC    Personal Factors and Comorbidities Age;Comorbidity 3+    Comorbidities past breast cancer surgery x 3, radiation, bilateral THR    Examination-Activity Limitations Reach Overhead    Examination-Participation Restrictions Yard Work    Stability/Clinical Decision Making Stable/Uncomplicated    Rehab Potential Good    PT Frequency 1x / week   visits only prn over the next 6 weeks   PT Duration 6 weeks    PT Treatment/Interventions ADLs/Self Care Home Management;Therapeutic activities;Functional mobility training;Therapeutic exercise;Orthotic Fit/Training;Manual lymph drainage;Manual techniques;Patient/family education;Compression bandaging;Scar mobilization;Taping;Passive range of motion    PT Next Visit Plan Reassess swelling, self care techniques, cont MLD prn    PT Home Exercise Plan MLD to right UE, compression sleeve, night tribute    Consulted and Agree with Plan of Care Patient             Patient will benefit from skilled therapeutic intervention in order to improve the following deficits and impairments:  Decreased range of motion, Decreased scar mobility, Increased  edema, Postural dysfunction, Impaired UE functional use, Impaired flexibility, Increased fascial restricitons, Decreased strength, Pain, Decreased skin integrity  Visit Diagnosis: Postmastectomy lymphedema  Stiffness of right shoulder, not elsewhere classified  Muscle weakness (generalized)     Problem List Patient Active Problem List   Diagnosis Date Noted   Pain due to onychomycosis of toenails of both feet 06/17/2020   Pain in right knee 04/22/2019   Trochanteric bursitis of right hip 07/06/2017   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

## 2020-07-21 DIAGNOSIS — Q149 Congenital malformation of posterior segment of eye, unspecified: Secondary | ICD-10-CM | POA: Diagnosis not present

## 2020-07-23 NOTE — Progress Notes (Signed)
Patient Care Team: [name], MD as PCP - General (Internal Medicine) [name], MD as Consulting Physician (General Surgery) [name], MD as Consulting Physician (Hematology and Oncology) Delice Bison Charlestine Massed, NP as Nurse Practitioner (Hematology and Oncology) Salvadore Dom, MD as Consulting Physician (Obstetrics and Gynecology)  DIAGNOSIS:    ICD-10-CM   1. 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 Malignant neoplasm of lower-inner quadrant of right breast of female, estrogen receptor negative ([place])  C50.311    Z17.1       SUMMARY OF ONCOLOGIC HISTORY: Oncology History  Breast cancer of lower-inner quadrant of right female breast ([place])  12/02/2014 Mammogram   Right breast irregular mass 1 cm size with calcifications    12/09/2014 Initial Diagnosis   Right breast biopsy 3:30 position: Invasive ductal carcinoma grade 3, ER 0%, PR 0%, HER-2 negative ratio 1.5, Ki-67 90%, T1C N0 stage IA clinical stage    12/31/2014 Genetic Testing   Testing revealed a mutation in the NBN gene called [id number]. Genes tested include:  ATM, BARD1, BRCA1, BRCA2, BRIP1, CDH1, CHEK2, EPCAM, FANCC, MLH1, MSH2, MSH6, NBN, PALB2, PMS2, PTEN, RAD51C, RAD51D, TP53, and XRCC2.    02/03/2015 Surgery   Right lumpectomy: Invasive ductal carcinoma grade 2, 2.1 cm, with associated DCIS, margins are negative, 0/1 lymph node, ER 0%, PR 0%, HER-2 positive ratio 2.31, T2 N0 stage II a    03/09/2015 - 06/22/2015 Chemotherapy   Adjuvant chemotherapy with [place] 6 followed by Herceptin maintenance for 1 year   08/11/2015 - 09/23/2015 Radiation Therapy   Adjuvant radiation therapy [name]):  1) Right Breast / 50 Gy in 25 fractions ; 2) Right Breast Boost / 10 Gy in 5 fractions    01/30/2017 Relapse/Recurrence   Skin biopsy right breast: Metastatic breast cancer GCDFP strongly positive    02/15/2017 PET scan   Hypermetabolic focus of skin thickening along the medial  right breast/chest wall consistent with recurrent disease, no other hypermetabolic metastases identified.    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 [place] Right Breast on 06/12/20 showed no evidence of malignancy, findings might represent resolving cellulitis. She presents to the clinic today for follow-up.  She reports to me that the redness is still persistent in spite of antibiotics. With antibiotics the redness has improved somewhat.  She has seen physical therapy and has a sleeve on.  The ultrasound of the breast does not show any infection or abscess or malignancy..  ALLERGIES:  has No Known Allergies.  MEDICATIONS:  Current Outpatient Medications  Medication Sig Dispense Refill   Biotin 5000 MCG CAPS Take by mouth.      calcium-vitamin D (OSCAL WITH D) 250-125 MG-UNIT tablet Take 1 tablet by mouth daily. Patient takes calcium 1275m plus vitamin D3 1,000 IU  cholecalciferol (VITAMIN D) 1000 units tablet Take 2,000 Units by mouth daily.      CRESTOR 20 MG tablet Take 1 tablet by mouth daily.   0   estradiol (ESTRACE) 0.1 MG/GM vaginal cream USE 1 GRAM VAGINALLY 2 TIMES A WEEK at bedtime 42.5 g 1   hydrochlorothiazide (MICROZIDE) 12.5 MG capsule Take 12.5 mg by mouth daily.      levofloxacin (LEVAQUIN) 500 MG  tablet Take 1 tablet (500 mg total) by mouth daily. 14 tablet 0   losartan (COZAAR) 100 MG tablet Take 100 mg by mouth daily.   0   metroNIDAZOLE (METROGEL) 0.75 % vaginal gel Place 1 Applicatorful vaginally at bedtime. X 5 nights 70 g 0   No current facility-administered medications for this visit.    PHYSICAL EXAMINATION: ECOG PERFORMANCE STATUS: 1 - Symptomatic but completely ambulatory  Vitals:   07/24/20 1032  BP: (!) 150/84  Pulse: 60  Resp: 18  Temp: 97.8 F (36.6 C)  SpO2: 100%   Filed Weights   07/24/20 1032  Weight: 148 lb 12.8 oz (67.5 kg)    BREAST: (exam performed in the presence of a chaperone)  LABORATORY DATA:  I have reviewed the data as listed CMP Latest Ref Rng & Units 05/14/2020 11/29/2017 03/07/2017  Glucose 70 - 99 mg/dL 79 139(H) 82  BUN 8 - 23 mg/dL 29(H) 22 26(H)  Creatinine 0.44 - 1.00 mg/dL 1.10(H) 1.00 0.94  Sodium 135 - 145 mmol/L 139 141 140  Potassium 3.5 - 5.1 mmol/L 3.8 3.3(L) 3.4(L)  Chloride 98 - 111 mmol/L 104 106 107  CO2 22 - 32 mmol/L _0 Calcium 8.9 - 10.3 mg/dL 9.3 9.7 9.2  Total Protein 6.5 - 8.1 g/dL 6.5 6.9 -  Total Bilirubin 0.3 - 1.2 mg/dL 0.8 0.7 -  Alkaline Phos 38 - 126 U/L 83 89 -  AST 15 - 41 U/L 17 24 -  ALT 0 - 44 U/L 14 20 -    Lab Results  Component Value Date   WBC 3.7 (L) 05/14/2020   HGB 13.7 05/14/2020   HCT 40.3 05/14/2020   MCV 98.8 05/14/2020   PLT 192 05/14/2020   NEUTROABS 2.9 05/14/2020    ASSESSMENT & PLAN:  Breast cancer of lower-inner quadrant of right female breast ([place]) Right lumpectomy 02/03/2015: Invasive ductal carcinoma grade 2, 2.1 cm, with associated DCIS, margins are negative, 0/1 lymph node, ER 0%, PR 0%, HER-2 positive ratio 2.31, T2 N0 stage II a. (Right breast biopsy 3:30 position 12/09/2014: Invasive ductal carcinoma grade 3, ER 0%, PR 0%, HER-2 negative ratio 1.5, Ki-67 90%, 1 cm irregular mass T1C N0 stage IA clinical stage) Genetic counseling revealed NBN mutation   2.  Adjuvant chemotherapy with [place] 6 cycles started 03/09/15 completed 06/22/15 took Herceptin maintenance for 1 year until 02/15/2016 3. Followed by radiation therapy 08/11/2015 to 09/22/2015 4. Cutaneous recurrence: 03/07/2017: Right mastectomy: IDC grade 3, 2 foci spanning 1.2 cm and 1.1 cm, lymphovascular invasion is present including dermal lymphatics, margins negative, 0/2 lymph nodes negative, ER 0%, PR 0%, HER-2 negative, Ki-67 not done,pT4b (skin involvement) N0 stage IIIc PET/CT 02/16/2017: No metastatic disease ----------------------------------------------------------------------------------------------- Patient decided not to receive any further systemic chemotherapy. There is no role of antiestrogen therapy because she is triple negative.   Chest wall Recurrence: 10/10/17: Chest wall excision: Breast cancer, ER 0%, PR 0%, HER-2 negative Adjuvant radiation therapy 11/14/2017-12/29/2017 Radiation pneumonitis on CT chest 03/05/2018   Breast cancer surveillance: 02/22/2019: Left breast  mammogram: Benign, breast density category C 07/26/2019: Breast exam: Benign, right mastectomy scar tissue intact without any lumps or nodules   Cellulitis left breast: Treated with Levaquin   05/18/2020: Ultrasound right breast and axilla: No sonographic evidence of malignancy.  Right arm lymphedema: Undergoing physical therapy.  The redness has improved and there does not appear to be any active infection. Therefore I recommended her to continue with physical therapy.  Return to clinic in 1 year for follow-up    No orders of the defined types were placed in this encounter.  The patient has a good understanding of the overall plan. she agrees with it. she will call with any problems that may develop before the next visit here.  Total time spent: 20 mins including face to face time and time spent for planning, charting and coordination of care  Rulon Eisenmenger, MD, MPH 07/24/2020  I, Thana Ates, am  acting as scribe for Dr. [name].  I have reviewed the above documentation for accuracy and completeness, and I agree with the above.

## 2020-07-24 ENCOUNTER — Inpatient Hospital Stay: Payer: Medicare Other | Attending: Hematology and Oncology | Admitting: Hematology and Oncology

## 2020-07-24 ENCOUNTER — Telehealth: Payer: Self-pay | Admitting: Hematology and Oncology

## 2020-07-24 ENCOUNTER — Other Ambulatory Visit: Payer: Self-pay

## 2020-07-24 VITALS — BP 150/84 | HR 60 | Temp 97.8°F | Resp 18 | Ht 68.5 in | Wt 148.8 lb

## 2020-07-24 DIAGNOSIS — Z171 Estrogen receptor negative status [ER-]: Secondary | ICD-10-CM | POA: Diagnosis not present

## 2020-07-24 DIAGNOSIS — Z79899 Other long term (current) drug therapy: Secondary | ICD-10-CM | POA: Insufficient documentation

## 2020-07-24 DIAGNOSIS — I890 Lymphedema, not elsewhere classified: Secondary | ICD-10-CM | POA: Insufficient documentation

## 2020-07-24 DIAGNOSIS — C50911 Malignant neoplasm of unspecified site of right female breast: Secondary | ICD-10-CM | POA: Diagnosis not present

## 2020-07-24 DIAGNOSIS — Z923 Personal history of irradiation: Secondary | ICD-10-CM | POA: Insufficient documentation

## 2020-07-24 DIAGNOSIS — Z9221 Personal history of antineoplastic chemotherapy: Secondary | ICD-10-CM | POA: Insufficient documentation

## 2020-07-24 DIAGNOSIS — Z9011 Acquired absence of right breast and nipple: Secondary | ICD-10-CM | POA: Insufficient documentation

## 2020-07-24 DIAGNOSIS — C50311 Malignant neoplasm of lower-inner quadrant of right female breast: Secondary | ICD-10-CM | POA: Diagnosis not present

## 2020-07-24 DIAGNOSIS — C792 Secondary malignant neoplasm of skin: Secondary | ICD-10-CM

## 2020-07-24 DIAGNOSIS — Z853 Personal history of malignant neoplasm of breast: Secondary | ICD-10-CM | POA: Insufficient documentation

## 2020-07-24 NOTE — Telephone Encounter (Signed)
Scheduled appointment per 07/08 los. Patietn is aware.

## 2020-07-24 NOTE — Assessment & Plan Note (Signed)
Right lumpectomy 02/03/2015: Invasive ductal carcinoma grade 2, 2.1 cm, with associated DCIS, margins are negative, 0/1 lymph node, ER 0%, PR 0%, HER-2 positiveratio 2.31, T2 N0 stage II a. (Right breast biopsy 3:30 position 12/09/2014: Invasive ductal carcinoma grade 3, ER 0%, PR 0%, HER-2 negativeratio 1.5, Ki-67 90%, 1 cm irregular mass T1C N0 stage IA clinical stage) Genetic counseling revealed NBN mutation  2. Adjuvant chemotherapy with [place] 6 cycles started 03/09/15 completed 06/22/15 tookHerceptin maintenance for 1 year until 02/15/2016 3. Followed by radiation therapy 08/11/2015 to09/05/2015 4. Cutaneous recurrence: 03/07/2017: Right mastectomy: IDC grade 3, 2 foci spanning 1.2 cm and 1.1 cm, lymphovascular invasion is present including dermal lymphatics, margins negative, 0/2 lymph nodes negative, ER 0%, PR 0%, HER-2 negative, Ki-67 not done,pT4b (skin involvement) N0 stage IIIc PET/CT 02/16/2017: No metastatic disease ----------------------------------------------------------------------------------------------- Patient decided not to receive any further systemic chemotherapy. There is no role of antiestrogen therapy because she is triple negative.  Chest wall Recurrence: 10/10/17: Chest wall excision: Breast cancer, ER 0%, PR 0%, HER-2 negative Adjuvant radiation therapy 11/14/2017-12/29/2017 Radiation pneumonitis on CT chest 03/05/2018  Breast cancer surveillance: 02/22/2019:Left breast mammogram: Benign, breast density category C 07/26/2019: Breast exam: Benign, right mastectomy scar tissue intact without any lumps or nodules  Cellulitis left breast: Started 05/12/2020 on Augmentin    Differential diagnosis infection versus malignancy. 05/18/2020: Ultrasound right breast and axilla: No sonographic evidence of malignancy.  Right arm lymphedema: Undergone physical therapy.  Return to clinic in 1 year for follow-up

## 2020-08-10 ENCOUNTER — Other Ambulatory Visit: Payer: Self-pay

## 2020-08-10 ENCOUNTER — Ambulatory Visit: Payer: Medicare Other | Attending: Hematology and Oncology

## 2020-08-10 DIAGNOSIS — M6281 Muscle weakness (generalized): Secondary | ICD-10-CM | POA: Diagnosis not present

## 2020-08-10 DIAGNOSIS — M25611 Stiffness of right shoulder, not elsewhere classified: Secondary | ICD-10-CM | POA: Insufficient documentation

## 2020-08-10 DIAGNOSIS — I972 Postmastectomy lymphedema syndrome: Secondary | ICD-10-CM | POA: Diagnosis not present

## 2020-08-10 NOTE — Therapy (Signed)
[place], [place], [address] Phone: [phone]   Fax:  [phone]  Physical Therapy Treatment  Patient Details  Name: Shawna [name] MRN: [id number] Date of Birth: Sep 20, 1940 Referring Provider (PT): Dr. Lindi Adie   Encounter Date: 08/10/2020   PT End of Session - 08/10/20 1044     Visit Number 11    Number of Visits 20    Date for PT Re-Evaluation 09/07/20    PT Start Time 0906    PT Stop Time 0955    PT Time Calculation (min) 49 min    Activity Tolerance Patient tolerated treatment well    Behavior During Therapy [facility] for tasks assessed/performed             Past Medical History:  Diagnosis Date   Breast cancer ([place]) 11/2014   Family history of breast cancer    Family history of colon cancer    History of kidney stones    History of radiation therapy 08/11/15- 09/23/2015   Right Breast   History of radiation therapy 11/13/17- 12/29/17   Right chest wall and IM nodes 50.04 Gy in 28 fractions, Right supraclavicular and PAB nodes 50.04 Gy in 28 fractions, Right Chest wall boost/ 10 Gy in 5 fractions.    Hyperlipidemia    Hypertension    Monoallelic mutation of NBN gene    Osteoporosis    Skin cancer of nose     Past Surgical History:  Procedure Laterality Date   APPENDECTOMY     BREAST LUMPECTOMY WITH RADIOACTIVE SEED AND SENTINEL LYMPH NODE BIOPSY Right 02/03/2015   Procedure: RIGHT BREAST LUMPECTOMY WITH RADIOACTIVE SEED AND RIGHT SENTINEL LYMPH NODE BIOPSY;  Surgeon: [name], MD;  Location: [facility];  Service: General;  Laterality: Right;   BREAST SURGERY  11/2012   biopsy, benign breast tissue   COLONOSCOPY  06/2010   rec   LITHOTRIPSY     kidney stone on left side   MASTECTOMY W/ SENTINEL NODE BIOPSY Right 03/07/2017   Procedure: RIGHT TOTAL MASTECTOMY WITH RIGHT AXILLARY SENTINEL LYMPH NODE BIOPSY;  Surgeon: [name], MD;  Location: [facility];  Service:  General;  Laterality: Right;   OVARIAN CYST REMOVAL  age 65   POLYPECTOMY  04/2005   colon   PORTACATH PLACEMENT Left 02/03/2015   Procedure: INSERTION PORT-A-CATH WITH ULTRA SOUND GUIDANCE;  Surgeon: [name], MD;  Location: [facility];  Service: General;  Laterality: Left;   reexcision  10/09/2017   Dr. Lucia Gaskins, chest wall excision for recurrent breast cancer.     There were no vitals filed for this visit.   Subjective Assessment - 08/10/20 0907     Subjective I saw Dr. Lindi Adie and he wanted me to come back for my arm.  I haven't been wearing the tribute night because with my arthritis it is hard to get it on.I have been wearing the sleeve during the day. The spot at my right chest is still there. My arm does look better in the am.    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  Past history includes bilateral total hip replacements, hyperlipidemia, hypertension and osteoporosis    Currently in Pain? No/denies  [facility] PT Assessment - 08/10/20 0001       Assessment   Medical Diagnosis right breast cancer    Referring Provider (PT) Dr. Lindi Adie    Onset Date/Surgical Date 10/18/14    Hand Dominance Right      Precautions   Precaution Comments  lymphedema      Observation/Other Assessments   Observations pt with mild pitting at ulnar border, and area of fullness is still present medial to axilla.  Bra still cuts into that area promoting swelling      Posture/Postural Control   Posture/Postural Control Postural limitations    Postural Limitations Forward head;Rounded Shoulders;Increased thoracic kyphosis      Palpation   Palpation comment very tight mastecomy site with little skin excursion/radiation fibrosis                LYMPHEDEMA/ONCOLOGY QUESTIONNAIRE - 08/10/20 0001       Right Upper Extremity Lymphedema   At Axilla  30.2 cm    15 cm Proximal to Olecranon Process 27 cm    10 cm Proximal to Olecranon Process 27.1 cm    Olecranon Process 24.7 cm    15 cm Proximal to Ulnar Styloid Process 20 cm    10 cm Proximal to Ulnar Styloid Process 16.5 cm    Just Proximal to Ulnar Styloid Process 14.8 cm    At Base of 2nd Digit 5.7 cm                        [facility] Adult PT Treatment/Exercise - 08/10/20 0001       Manual Therapy   Edema Management Discussed compression  bra and swell spot to treat area of swelling that pockets from where her bra seems to sink into incision area. Pt given a script to go to [facility] [place]. Pt applied compression sleeve at completion of rx    Manual Lymphatic Drainage (MLD) MLD was performed to short neck, left axillary and right inguinal LN's,  5 diaphragmatic breaths,Anterior interaxillary anastomosis, Right axilloinguinal pathway, and right UE starting proximally and retracing steps and working distally to the hand.  Extra time spent on swelling at medial to right axilla and pathways and pt advised to work on this at home.                    PT Education - 08/10/20 1039     Education Details Pt reminded to perform stretching exercises at home, continue MLD for swelling, and to wear tribute at night to decrease pitting  Pt advised to have her son help her with tribute secondary to her concerns of aggravating her arthritis.  Pt to go to [facility] to get compression bra/swell spot    Person(s) Educated Patient    Methods Explanation    Comprehension Verbalized understanding                 PT Long Term Goals - 08/10/20 1051       PT LONG TERM GOAL #1   Title Pt will have decrease in Right arm swelling at 10 cm prox to ulnar styloid by 1 cm    Baseline 21.7 on 05/12/2020    Time 4    Period Weeks    Status Achieved       PT LONG TERM GOAL #2   Title Pt will report she knows lymphedema risk reduction practices and what to do to manage swelling  Time 4    Period Weeks    Status On-going    Target Date 09/07/20      PT LONG TERM GOAL #3   Title Pt will be independent in a home exericse program for shoulder ROM and strength    Period Weeks    Status On-going    Target Date 09/07/20      PT LONG TERM GOAL #4   Title Pt will have 140 degrees of right shoulder flexion so that she can perform her activities easier    Baseline 128  07/03/20    Time 4    Period Weeks    Status On-going    Target Date 09/07/20      PT LONG TERM GOAL #5   Title Pt will be fit for compression sleeve and will be independent with donning and doffing    Time 4    Period Weeks    Status Achieved      Additional Long Term Goals   Additional Long Term Goals Yes      PT LONG TERM GOAL #6   Title Pt will have 0- mild pitting edema at ulna border    Time 4    Period Weeks    Status New    Target Date 09/07/20      PT LONG TERM GOAL #7   Title Pt will have compression bra and swell spot or chip pack that can assist with decreasing swelling at right chest area    Time 4    Period Weeks    Status New    Target Date 09/07/20                   Plan - 08/10/20 1045     Clinical Impression Statement Pt saw Dr. Lindi Adie who wanted her to return for therapy.  She has been compliant with compression sleeve, but has not been wearing her tribute at night and she has increased pitting edema at the border of her ulna close to olecranon.  The area of swelling medial and proximal to her axilla is also mildly red with some mild redness also noted at her back.  Treatment consisted of remeasuring, Manual lymph drainage and discussion on ways to decrease swelling.  Pt advised to have her son help her with night time tribute to decrease pitting and to start performing MLD more regularly. She will go get fit for a compression bra  and swell spot to address swelling medial to axilla at chest    Personal Factors and Comorbidities Age;Comorbidity 3+    Comorbidities past breast cancer surgery x 3, radiation, bilateral THR    Examination-Activity Limitations Reach Overhead    Examination-Participation Restrictions Yard Work    Stability/Clinical Decision Making Stable/Uncomplicated    Rehab Potential Good    PT Frequency 2x / week    PT Duration 4 weeks    PT Treatment/Interventions ADLs/Self Care Home Management;Therapeutic activities;Functional mobility training;Therapeutic exercise;Orthotic Fit/Training;Manual lymph drainage;Manual techniques;Patient/family education;Compression bandaging;Scar mobilization;Taping;Passive range of motion    PT Next Visit Plan Continue MLD and review with pt, did she get bra?wear tribute at night?, MFR to chest/scar area, PROM right shoulder    PT Home Exercise Plan MLD to right UE, compression sleeve, night tribute    Consulted and Agree with Plan of Care Patient             Patient will benefit from skilled therapeutic intervention in order to improve the following deficits and impairments:  Decreased range of motion, Decreased scar mobility, Increased edema, Postural dysfunction, Impaired UE functional use, Impaired flexibility, Increased fascial restricitons, Decreased strength, Pain, Decreased skin integrity  Visit Diagnosis: Postmastectomy lymphedema  Stiffness of right shoulder, not elsewhere classified  Muscle weakness (generalized)     Problem List Patient Active Problem List   Diagnosis Date Noted   Pain due to onychomycosis of toenails of both feet 06/17/2020   Pain in right knee 04/22/2019   Trochanteric bursitis of right hip 07/06/2017   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

## 2020-08-19 ENCOUNTER — Ambulatory Visit: Payer: Medicare Other | Attending: Hematology and Oncology

## 2020-08-19 ENCOUNTER — Other Ambulatory Visit: Payer: Self-pay

## 2020-08-19 DIAGNOSIS — M25611 Stiffness of right shoulder, not elsewhere classified: Secondary | ICD-10-CM | POA: Diagnosis not present

## 2020-08-19 DIAGNOSIS — I972 Postmastectomy lymphedema syndrome: Secondary | ICD-10-CM | POA: Insufficient documentation

## 2020-08-19 DIAGNOSIS — M6281 Muscle weakness (generalized): Secondary | ICD-10-CM | POA: Diagnosis not present

## 2020-08-19 NOTE — Therapy (Signed)
[place], [place], [address] Phone: [phone]   Fax:  [phone]  Physical Therapy Treatment  Patient Details  Name: Shawna [name] MRN: [id number] Date of Birth: June 25, 1940 Referring Provider (PT): Dr. Lindi Adie   Encounter Date: 08/19/2020   PT End of Session - 08/19/20 1034     Visit Number 12    Number of Visits 20    Date for PT Re-Evaluation 09/07/20    PT Start Time 1005    PT Stop Time 1052    PT Time Calculation (min) 47 min    Activity Tolerance Patient tolerated treatment well    Behavior During Therapy [facility] for tasks assessed/performed             Past Medical History:  Diagnosis Date   Breast cancer ([place]) 11/2014   Family history of breast cancer    Family history of colon cancer    History of kidney stones    History of radiation therapy 08/11/15- 09/23/2015   Right Breast   History of radiation therapy 11/13/17- 12/29/17   Right chest wall and IM nodes 50.04 Gy in 28 fractions, Right supraclavicular and PAB nodes 50.04 Gy in 28 fractions, Right Chest wall boost/ 10 Gy in 5 fractions.    Hyperlipidemia    Hypertension    Monoallelic mutation of NBN gene    Osteoporosis    Skin cancer of nose     Past Surgical History:  Procedure Laterality Date   APPENDECTOMY     BREAST LUMPECTOMY WITH RADIOACTIVE SEED AND SENTINEL LYMPH NODE BIOPSY Right 02/03/2015   Procedure: RIGHT BREAST LUMPECTOMY WITH RADIOACTIVE SEED AND RIGHT SENTINEL LYMPH NODE BIOPSY;  Surgeon: [name], MD;  Location: [place];  Service: General;  Laterality: Right;   BREAST SURGERY  11/2012   biopsy, benign breast tissue   COLONOSCOPY  06/2010   rec   LITHOTRIPSY     kidney stone on left side   MASTECTOMY W/ SENTINEL NODE BIOPSY Right 03/07/2017   Procedure: RIGHT TOTAL MASTECTOMY WITH RIGHT AXILLARY SENTINEL LYMPH NODE BIOPSY;  Surgeon: [name], MD;  Location: [place];  Service:  General;  Laterality: Right;   OVARIAN CYST REMOVAL  age 85   POLYPECTOMY  04/2005   colon   PORTACATH PLACEMENT Left 02/03/2015   Procedure: INSERTION PORT-A-CATH WITH ULTRA SOUND GUIDANCE;  Surgeon: [name], MD;  Location: [place];  Service: General;  Laterality: Left;   reexcision  10/09/2017   Dr. Lucia Gaskins, chest wall excision for recurrent breast cancer.     There were no vitals filed for this visit.   Subjective Assessment - 08/19/20 1007     Subjective Shawna [name] sent me with several types of compression bras to show you to see if they would hold the pack.  I have been wearing the tribute at night and I can get it on myself now. I have been doing my exercises too.    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  Past history includes bilateral total hip replacements, hyperlipidemia, hypertension and osteoporosis                   LYMPHEDEMA/ONCOLOGY QUESTIONNAIRE - 08/19/20 0001       Right Upper Extremity Lymphedema   At  Axilla  29.5 cm    15 cm Proximal to Olecranon Process 27 cm    10 cm Proximal to Olecranon Process 26.7 cm    Olecranon Process 24.3 cm    15 cm Proximal to Ulnar Styloid Process 19.8 cm    10 cm Proximal to Ulnar Styloid Process 16.8 cm    Just Proximal to Ulnar Styloid Process 14.8 cm    At Base of 2nd Digit 5.8 cm                        [facility] Adult PT Treatment/Exercise - 08/19/20 0001       Manual Therapy   Manual therapy comments pt was wearing sleeve without guantlet wiith no compaints of hand swelling    Edema Management pt tried on various compression bras to determine which would be best for holding in the swell spot or chip pack. Assessed for fit and comfort.  Determined to try chip pack instead of circular swell spot to start.  Also tried different prostheses to try with new bra   Manual Lymphatic Drainage (MLD) MLD was performed to short neck, left axillary and right inguinal LN's, Anterior interaxillary anastomosis, Right axilloinguinal pathway, and right UE starting proximally and retracing steps and working distally to the hand.  Extra time spent on swelling at medial to right axilla and pathways and pt advised to work on this at home.                         PT Long Term Goals - 08/10/20 1051       PT LONG TERM GOAL #1   Title Pt will have decrease in Right arm swelling at 10 cm prox to ulnar styloid by 1 cm    Baseline 21.7 on 05/12/2020    Time 4    Period Weeks    Status Achieved      PT LONG TERM GOAL #2   Title Pt will report she knows lymphedema risk reduction practices and what to do to manage swelling    Time 4    Period Weeks    Status On-going    Target Date 09/07/20      PT LONG TERM GOAL #3   Title Pt will be independent in a home exericse program for shoulder ROM and strength    Period Weeks    Status On-going    Target Date 09/07/20      PT LONG TERM GOAL #4   Title Pt will have 140 degrees of right shoulder flexion so that she can perform her activities easier    Baseline 128  07/03/20    Time 4    Period Weeks    Status On-going    Target Date 09/07/20      PT LONG TERM GOAL #5   Title Pt will be fit for compression sleeve and will be independent with donning and doffing    Time 4    Period Weeks    Status Achieved      Additional Long Term Goals   Additional Long Term Goals Yes      PT LONG TERM GOAL #6   Title Pt will have 0- mild pitting edema at ulna border    Time 4    Period Weeks    Status New    Target Date 09/07/20      PT LONG TERM GOAL #7   Title Pt  will have compression bra and swell spot or chip pack that can assist with decreasing swelling at right chest area    Time 4     Period Weeks    Status New    Target Date 09/07/20                   Plan - 08/19/20 1054     Clinical Impression Statement Pt brought several different compression bras and prosthesis to try on to see which would be best for holding in a chip pack or swell spot.  She preferred the larger of the bras that hooks up the front for comfort and it provided much better coverage to prevent swelling at her right chest.  We also tried different prostheses and she will see if she can get the one we liked in a slightly larger size.  Measurements of the right arm were taken.  She did reduce slightly at the upper arm, while forearm remained fairly stable.  There is decreased pitting at the olecranon and ulnar border and she has been compliant with her tribute night and exercises. She will bring chip pack and new bra with her tomorrow.    Personal Factors and Comorbidities Age;Comorbidity 3+    Comorbidities past breast cancer surgery x 3, radiation, bilateral THR    Examination-Activity Limitations Reach Overhead    Stability/Clinical Decision Making Stable/Uncomplicated    Rehab Potential Good    PT Frequency 2x / week    PT Duration 4 weeks    PT Treatment/Interventions ADLs/Self Care Home Management;Therapeutic activities;Functional mobility training;Therapeutic exercise;Orthotic Fit/Training;Manual lymph drainage;Manual techniques;Patient/family education;Compression bandaging;Scar mobilization;Taping;Passive range of motion    PT Next Visit Plan add chip pack to bra and adjust size prn,MLD, MFR to chest, PROM shoulder    PT Home Exercise Plan MLD to right UE, compression sleeve, night tribute    Consulted and Agree with Plan of Care Patient             Patient will benefit from skilled therapeutic intervention in order to improve the following deficits and impairments:  Decreased range of motion, Decreased scar mobility, Increased edema, Postural dysfunction, Impaired UE functional  use, Impaired flexibility, Increased fascial restricitons, Decreased strength, Pain, Decreased skin integrity  Visit Diagnosis: Postmastectomy lymphedema  Stiffness of right shoulder, not elsewhere classified  Muscle weakness (generalized)     Problem List Patient Active Problem List   Diagnosis Date Noted   Pain due to onychomycosis of toenails of both feet 06/17/2020   Pain in right knee 04/22/2019   Trochanteric bursitis of right hip 07/06/2017   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

## 2020-08-20 ENCOUNTER — Ambulatory Visit: Payer: Medicare Other

## 2020-08-20 DIAGNOSIS — M25611 Stiffness of right shoulder, not elsewhere classified: Secondary | ICD-10-CM

## 2020-08-20 DIAGNOSIS — I972 Postmastectomy lymphedema syndrome: Secondary | ICD-10-CM

## 2020-08-20 DIAGNOSIS — M6281 Muscle weakness (generalized): Secondary | ICD-10-CM | POA: Diagnosis not present

## 2020-08-20 NOTE — Patient Instructions (Signed)
SHOULDER: Flexion - Supine (Cane)        Cancer Rehab [id number]    Hold cane in both hands. Raise arms up overhead. Do not allow back to arch. Hold _5__ seconds. Do __5-10__ times; __1-2__ times a day. Hands shoulder width apart Hands wider apart; V position   Shoulder Blade Stretch    Clasp fingers behind head with elbows touching in front of face. Pull elbows back while pressing shoulder blades together. Relax and hold as tolerated, can place pillow under elbow here for comfort as needed and to allow for prolonged stretch.  Repeat __5__ times. Do __1-2__ sessions per day.     S

## 2020-08-20 NOTE — Therapy (Signed)
[place], [place], [address] Phone: [phone]   Fax:  [phone]  Physical Therapy Treatment  Patient Details  Name: Shawna [name] MRN: [id number] Date of Birth: March 08, 1940 Referring Provider (PT): Dr. Lindi Adie   Encounter Date: 08/20/2020   PT End of Session - 08/20/20 1206     Visit Number 13    Number of Visits 20    Date for PT Re-Evaluation 09/07/20    PT Start Time 1108    PT Stop Time 1201    PT Time Calculation (min) 53 min    Activity Tolerance Patient tolerated treatment well    Behavior During Therapy [facility] for tasks assessed/performed             Past Medical History:  Diagnosis Date   Breast cancer ([place]) 11/2014   Family history of breast cancer    Family history of colon cancer    History of kidney stones    History of radiation therapy 08/11/15- 09/23/2015   Right Breast   History of radiation therapy 11/13/17- 12/29/17   Right chest wall and IM nodes 50.04 Gy in 28 fractions, Right supraclavicular and PAB nodes 50.04 Gy in 28 fractions, Right Chest wall boost/ 10 Gy in 5 fractions.    Hyperlipidemia    Hypertension    Monoallelic mutation of NBN gene    Osteoporosis    Skin cancer of nose     Past Surgical History:  Procedure Laterality Date   APPENDECTOMY     BREAST LUMPECTOMY WITH RADIOACTIVE SEED AND SENTINEL LYMPH NODE BIOPSY Right 02/03/2015   Procedure: RIGHT BREAST LUMPECTOMY WITH RADIOACTIVE SEED AND RIGHT SENTINEL LYMPH NODE BIOPSY;  Surgeon: [name], MD;  Location: [place];  Service: General;  Laterality: Right;   BREAST SURGERY  11/2012   biopsy, benign breast tissue   COLONOSCOPY  06/2010   rec   LITHOTRIPSY     kidney stone on left side   MASTECTOMY W/ SENTINEL NODE BIOPSY Right 03/07/2017   Procedure: RIGHT TOTAL MASTECTOMY WITH RIGHT AXILLARY SENTINEL LYMPH NODE BIOPSY;  Surgeon: [name], MD;  Location: [place];  Service:  General;  Laterality: Right;   OVARIAN CYST REMOVAL  age 33   POLYPECTOMY  04/2005   colon   PORTACATH PLACEMENT Left 02/03/2015   Procedure: INSERTION PORT-A-CATH WITH ULTRA SOUND GUIDANCE;  Surgeon: [name], MD;  Location: [place];  Service: General;  Laterality: Left;   reexcision  10/09/2017   Dr. Lucia Gaskins, chest wall excision for recurrent breast cancer.     There were no vitals filed for this visit.   Subjective Assessment - 08/20/20 1111     Subjective I got the bra and I have been wearing it today.  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  Past history includes bilateral total hip replacements, hyperlipidemia, hypertension and osteoporosis                               [facility] Adult PT Treatment/Exercise - 08/20/20 0001       Shoulder Exercises: Supine   Other Supine Exercises supine wand flex and scaption, stargazer stretch  Manual Therapy   Edema Management Pt puchased bra yesterday and had on today. It is comfortable for her and stays up high enough to put pressure where she needs it to decrease swelling    Myofascial Release MFR to right chest area, scar mobilization to improve scar mobility to try and decrease swelling    Manual Lymphatic Drainage (MLD) MLD was performed to short neck, left axillary and right inguinal LN's, Anterior interaxillary anastomosis, Right axilloinguinal pathway, and right UE starting proximally and retracing steps and working distally to the hand.  Extra time spent on swelling at medial to right axilla and pathways and pt advised to work on this at home.                    PT Education - 08/20/20 1206      Education Details pt was educated in supine wand flex and scaption, and stargazer stretch in supine    Person(s) Educated Patient    Methods Explanation;Handout    Comprehension Returned demonstration                 PT Long Term Goals - 08/10/20 1051       PT LONG TERM GOAL #1   Title Pt will have decrease in Right arm swelling at 10 cm prox to ulnar styloid by 1 cm    Baseline 21.7 on 05/12/2020    Time 4    Period Weeks    Status Achieved      PT LONG TERM GOAL #2   Title Pt will report she knows lymphedema risk reduction practices and what to do to manage swelling    Time 4    Period Weeks    Status On-going    Target Date 09/07/20      PT LONG TERM GOAL #3   Title Pt will be independent in a home exericse program for shoulder ROM and strength    Period Weeks    Status On-going    Target Date 09/07/20      PT LONG TERM GOAL #4   Title Pt will have 140 degrees of right shoulder flexion so that she can perform her activities easier    Baseline 128  07/03/20    Time 4    Period Weeks    Status On-going    Target Date 09/07/20      PT LONG TERM GOAL #5   Title Pt will be fit for compression sleeve and will be independent with donning and doffing    Time 4    Period Weeks    Status Achieved      Additional Long Term Goals   Additional Long Term Goals Yes      PT LONG TERM GOAL #6   Title Pt will have 0- mild pitting edema at ulna border    Time 4    Period Weeks    Status New    Target Date 09/07/20      PT LONG TERM GOAL #7   Title Pt will have compression bra and swell spot or chip pack that can assist with decreasing swelling at right chest area    Time 4    Period Weeks    Status New    Target Date 09/07/20                   Plan - 08/20/20 1207     Clinical Impression Statement Spent some time today trying on different  prosthesis with her new bra and decided on the one she had purchased although it does shift on her.  The bra  provides good coverage for her area of swelling and she is happy with the fit.  I initiated scar mobilization and MLD to the right chest area in an attempt to open things up to decrease the pocket of swelling she has.  continued MLD to the right UE and right chest region.  Also instructed pt in ROM exercises for flexion and scaption, and stargazer    Personal Factors and Comorbidities Age;Comorbidity 3+    Comorbidities past breast cancer surgery x 3, radiation, bilateral THR    Examination-Activity Limitations Reach Overhead    Examination-Participation Restrictions Yard Work    Stability/Clinical Decision Making Stable/Uncomplicated    Rehab Potential Good    PT Frequency 2x / week    PT Duration 4 weeks    PT Treatment/Interventions ADLs/Self Care Home Management;Therapeutic activities;Functional mobility training;Therapeutic exercise;Orthotic Fit/Training;Manual lymph drainage;Manual techniques;Patient/family education;Compression bandaging;Scar mobilization;Taping;Passive range of motion    PT Next Visit Plan make another chip pack as needed,MFR, MLD    PT Home Exercise Plan MLD to right UE, compression sleeve, night tribute    Consulted and Agree with Plan of Care Patient             Patient will benefit from skilled therapeutic intervention in order to improve the following deficits and impairments:  Decreased range of motion, Decreased scar mobility, Increased edema, Postural dysfunction, Impaired UE functional use, Impaired flexibility, Increased fascial restricitons, Decreased strength, Pain, Decreased skin integrity  Visit Diagnosis: Postmastectomy lymphedema  Stiffness of right shoulder, not elsewhere classified  Muscle weakness (generalized)     Problem List Patient Active Problem List   Diagnosis Date Noted   Pain due to onychomycosis of toenails of both feet 06/17/2020   Pain in right knee 04/22/2019   Trochanteric bursitis of right hip 07/06/2017   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

## 2020-08-24 ENCOUNTER — Other Ambulatory Visit: Payer: Self-pay

## 2020-08-24 ENCOUNTER — Ambulatory Visit: Payer: Medicare Other

## 2020-08-24 DIAGNOSIS — M25611 Stiffness of right shoulder, not elsewhere classified: Secondary | ICD-10-CM

## 2020-08-24 DIAGNOSIS — M6281 Muscle weakness (generalized): Secondary | ICD-10-CM

## 2020-08-24 DIAGNOSIS — I972 Postmastectomy lymphedema syndrome: Secondary | ICD-10-CM

## 2020-08-24 NOTE — Therapy (Signed)
[place], [place], [address] Phone: [phone]   Fax:  [phone]  Physical Therapy Treatment  Patient Details  Name: Shawna [name] MRN: [id number] Date of Birth: 1940-11-04 Referring Provider (PT): Dr. Lindi Adie   Encounter Date: 08/24/2020   PT End of Session - 08/24/20 1613     Number of Visits 20    Date for PT Re-Evaluation 09/07/20    PT Start Time 1410    PT Stop Time [id number]    PT Time Calculation (min) 47 min    Activity Tolerance Patient tolerated treatment well    Behavior During Therapy [facility] for tasks assessed/performed             Past Medical History:  Diagnosis Date   Breast cancer ([place]) 11/2014   Family history of breast cancer    Family history of colon cancer    History of kidney stones    History of radiation therapy 08/11/15- 09/23/2015   Right Breast   History of radiation therapy 11/13/17- 12/29/17   Right chest wall and IM nodes 50.04 Gy in 28 fractions, Right supraclavicular and PAB nodes 50.04 Gy in 28 fractions, Right Chest wall boost/ 10 Gy in 5 fractions.    Hyperlipidemia    Hypertension    Monoallelic mutation of NBN gene    Osteoporosis    Skin cancer of nose     Past Surgical History:  Procedure Laterality Date   APPENDECTOMY     BREAST LUMPECTOMY WITH RADIOACTIVE SEED AND SENTINEL LYMPH NODE BIOPSY Right 02/03/2015   Procedure: RIGHT BREAST LUMPECTOMY WITH RADIOACTIVE SEED AND RIGHT SENTINEL LYMPH NODE BIOPSY;  Surgeon: [name], MD;  Location: [place];  Service: General;  Laterality: Right;   BREAST SURGERY  11/2012   biopsy, benign breast tissue   COLONOSCOPY  06/2010   rec   LITHOTRIPSY     kidney stone on left side   MASTECTOMY W/ SENTINEL NODE BIOPSY Right 03/07/2017   Procedure: RIGHT TOTAL MASTECTOMY WITH RIGHT AXILLARY SENTINEL LYMPH NODE BIOPSY;  Surgeon: [name], MD;  Location: [facility];  Service: General;  Laterality:  Right;   OVARIAN CYST REMOVAL  age 80   POLYPECTOMY  04/2005   colon   PORTACATH PLACEMENT Left 02/03/2015   Procedure: INSERTION PORT-A-CATH WITH ULTRA SOUND GUIDANCE;  Surgeon: [name], MD;  Location: [place];  Service: General;  Laterality: Left;   reexcision  10/09/2017   Dr. Lucia Gaskins, chest wall excision for recurrent breast cancer.     There were no vitals filed for this visit.   Subjective Assessment - 08/24/20 1411     Subjective The bra has been staying in place well, but I had to experiment with a different prosthesis to get it to stay in place. I wore it all day SAT and Sunday and today so far. The area that appears swollen is still there and seems to be the same size.    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  Past history includes bilateral total hip replacements, hyperlipidemia, hypertension and osteoporosis    Patient Stated Goals to get rid of the swelling    Currently in Pain? No/denies  [facility] Adult PT Treatment/Exercise - 08/24/20 0001       Manual Therapy   Edema Management new smaller chip pack made for Right chest region and held in place with new bra.   Myofascial Release MFR to right chest area, scar mobilization to improve scar mobility to try and decrease swelling    Manual Lymphatic Drainage (MLD) MLD was performed to short neck, left axillary and right inguinal LN's, Anterior interaxillary anastomosis, Right axilloinguinal pathway, and right UE starting proximally and retracing steps and working distally to the hand.  Extra time spent on swelling at medial to right axilla and pathways and pt advised to work on this at home.                          PT Long Term Goals - 08/10/20 1051       PT LONG TERM GOAL #1   Title Pt will have decrease in Right arm swelling at 10 cm prox to ulnar styloid by 1 cm    Baseline 21.7 on 05/12/2020    Time 4    Period Weeks    Status Achieved      PT LONG TERM GOAL #2   Title Pt will report she knows lymphedema risk reduction practices and what to do to manage swelling    Time 4    Period Weeks    Status On-going    Target Date 09/07/20      PT LONG TERM GOAL #3   Title Pt will be independent in a home exericse program for shoulder ROM and strength    Period Weeks    Status On-going    Target Date 09/07/20      PT LONG TERM GOAL #4   Title Pt will have 140 degrees of right shoulder flexion so that she can perform her activities easier    Baseline 128  07/03/20    Time 4    Period Weeks    Status On-going    Target Date 09/07/20      PT LONG TERM GOAL #5   Title Pt will be fit for compression sleeve and will be independent with donning and doffing    Time 4    Period Weeks    Status Achieved      Additional Long Term Goals   Additional Long Term Goals Yes      PT LONG TERM GOAL #6   Title Pt will have 0- mild pitting edema at ulna border    Time 4    Period Weeks    Status New    Target Date 09/07/20      PT LONG TERM GOAL #7   Title Pt will have compression bra and swell spot or chip pack that can assist with decreasing swelling at right chest area    Time 4    Period Weeks    Status New    Target Date 09/07/20                   Plan - 08/24/20 1613     Clinical Impression Statement Pts right arm swelling looks excellent today and there is virtually no pitting edema at the olecranon and border of ulna.  She has been compliant with wearing her new bra and has found a prosthesis that is comfortable for her however, there is no change in the size of the swelling at right chest medial to axilla.  In  looking at previous US to this area it was  described as a prominent fat pad with normal subcutaneous tissue and no noted fluid.  Pt will try the new chip pack that was made in her bra until next visit, but if her arm looks good and there is no change may limit further visits. Pt will need review of MLD   Personal Factors and Comorbidities Age;Comorbidity 3+    Comorbidities past breast cancer surgery x 3, radiation, bilateral THR    Examination-Activity Limitations Reach Overhead    Examination-Participation Restrictions Yard Work    Stability/Clinical Decision Making Stable/Uncomplicated    Rehab Potential Good    PT Frequency 2x / week    PT Duration 4 weeks    PT Treatment/Interventions ADLs/Self Care Home Management;Therapeutic activities;Functional mobility training;Therapeutic exercise;Orthotic Fit/Training;Manual lymph drainage;Manual techniques;Patient/family education;Compression bandaging;Scar mobilization;Taping;Passive range of motion    PT Next Visit Plan MFR,scar mobs, review MLD with pt, measure    PT Home Exercise Plan MLD to right UE, compression sleeve, night tribute    Consulted and Agree with Plan of Care Patient             Patient will benefit from skilled therapeutic intervention in order to improve the following deficits and impairments:  Decreased range of motion, Decreased scar mobility, Increased edema, Postural dysfunction, Impaired UE functional use, Impaired flexibility, Increased fascial restricitons, Decreased strength, Pain, Decreased skin integrity  Visit Diagnosis: Postmastectomy lymphedema  Stiffness of right shoulder, not elsewhere classified  Muscle weakness (generalized)     Problem List Patient Active Problem List   Diagnosis Date Noted   Pain due to onychomycosis of toenails of both feet 06/17/2020   Pain in right knee 04/22/2019   Trochanteric bursitis of right hip 07/06/2017   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

## 2020-08-26 ENCOUNTER — Other Ambulatory Visit: Payer: Self-pay

## 2020-08-26 ENCOUNTER — Ambulatory Visit: Payer: Medicare Other | Admitting: Obstetrics and Gynecology

## 2020-08-26 ENCOUNTER — Encounter: Payer: Self-pay | Admitting: Obstetrics and Gynecology

## 2020-08-26 VITALS — BP 122/76 | HR 74 | Wt 148.0 lb

## 2020-08-26 DIAGNOSIS — Z4689 Encounter for fitting and adjustment of other specified devices: Secondary | ICD-10-CM | POA: Diagnosis not present

## 2020-08-26 NOTE — Progress Notes (Signed)
GYNECOLOGY  VISIT   HPI: 80 y.o.   Widowed White or Caucasian Not Hispanic or Latino  female   [phone] with Patient's last menstrual period was 01/17/1982 (approximate).   here for pessary check. She has a #3 cube pessary for a cystocele and uterine prolapse. Using vaginal estrogen 1 x a week. No c/o. No bleeding.   GYNECOLOGIC HISTORY: Patient's last menstrual period was 01/17/1982 (approximate). 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, right ([place]) 0000000   Monoallelic mutation of NBN gene    Genetic testing 01/13/2015   Family history of breast cancer    Family history of colon cancer    Breast cancer of lower-inner quadrant of right female breast ([place]) 12/30/2014    Past Medical History:  Diagnosis Date   Breast cancer ([place]) 11/2014   Family history of breast cancer    Family history of colon cancer    History of kidney stones    History of radiation therapy 08/11/15- 09/23/2015   Right Breast   History of radiation therapy 11/13/17- 12/29/17   Right chest wall and IM nodes 50.04 Gy in 28 fractions, Right supraclavicular and PAB nodes 50.04 Gy in 28 fractions, Right Chest wall boost/ 10 Gy in 5 fractions.    Hyperlipidemia    Hypertension    Monoallelic mutation of NBN gene    Osteoporosis    Skin cancer of nose     Past Surgical History:  Procedure Laterality Date   APPENDECTOMY     BREAST LUMPECTOMY WITH RADIOACTIVE SEED AND SENTINEL LYMPH NODE BIOPSY Right 02/03/2015   Procedure: RIGHT BREAST LUMPECTOMY WITH RADIOACTIVE SEED AND  RIGHT SENTINEL LYMPH NODE BIOPSY;  Surgeon: [name], MD;  Location: [facility];  Service: General;  Laterality: Right;   BREAST SURGERY  11/2012   biopsy, benign breast tissue   COLONOSCOPY  06/2010   rec   LITHOTRIPSY     kidney stone on left side   MASTECTOMY W/ SENTINEL NODE BIOPSY Right 03/07/2017   Procedure: RIGHT TOTAL MASTECTOMY WITH RIGHT AXILLARY SENTINEL LYMPH NODE BIOPSY;  Surgeon: [name], MD;  Location: [facility];  Service: General;  Laterality: Right;   OVARIAN CYST REMOVAL  age 73   POLYPECTOMY  04/2005   colon   PORTACATH PLACEMENT Left 02/03/2015   Procedure: INSERTION PORT-A-CATH WITH ULTRA SOUND GUIDANCE;  Surgeon: [name], MD;  Location: [facility];  Service: General;  Laterality: Left;   reexcision  10/09/2017   Dr. Lucia Gaskins, chest wall excision for recurrent breast cancer.     Current Outpatient Medications  Medication Sig Dispense Refill   Biotin 5000 MCG CAPS Take by mouth.      calcium-vitamin D (OSCAL WITH D) 250-125 MG-UNIT tablet Take 1 tablet by mouth  daily. Patient takes calcium '1200mg'$  plus vitamin D3 1,000 IU      cholecalciferol (VITAMIN D) 1000 units tablet Take 2,000 Units by mouth daily.      CRESTOR 20 MG tablet Take 1 tablet by mouth daily.   0   estradiol (ESTRACE) 0.1 MG/GM vaginal cream USE 1 GRAM VAGINALLY 2 TIMES A WEEK at bedtime 42.5 g 1   hydrochlorothiazide (MICROZIDE) 12.5 MG capsule Take 12.5 mg by mouth daily.      losartan (COZAAR) 100 MG tablet Take 100 mg by mouth daily.   0   No current facility-administered medications for this visit.     ALLERGIES: Patient has no known allergies.  Family History  Problem Relation Age of Onset   Colon cancer Sister 58       died at 79   Breast cancer Mother 75   Hypertension Mother    Osteoporosis Mother    Heart disease Father        CABG   Dementia Father    Throat cancer Brother 87   Breast cancer Sister 24   Lymphoma Sister 63   Melanoma Sister     Skin cancer Brother    Skin cancer Daughter     Social History   Socioeconomic History   Marital status: Widowed    Spouse name: Not on file   Number of children: 2   Years of education: Not on file   Highest education level: Not on file  Occupational History   Not on file  Tobacco Use   Smoking status: Never   Smokeless tobacco: Never  Vaping Use   Vaping Use: Never used  Substance and Sexual Activity   Alcohol use: Yes    Comment: rarely   Drug use: No   Sexual activity: Not Currently    Partners: Male    Birth control/protection: Post-menopausal  Other Topics Concern   Not on file  Social History Narrative   Not on file   Social Determinants of Health   Financial Resource Strain: Not on file  Food Insecurity: Not on file  Transportation Needs: Not on file  Physical Activity: Not on file  Stress: Not on file  Social Connections: Not on file  Intimate Partner Violence: Not on file    Review of Systems  All other systems reviewed and are negative.  PHYSICAL EXAMINATION:    LMP 01/17/1982 (Approximate)     General appearance: alert, cooperative and appears stated age Pelvic: External genitalia:  no lesions              Urethra:  normal appearing urethra with no masses, tenderness or lesions              Bartholins and Skenes: normal                 Vagina: the pessary was removed and cleaned, no vaginal irritation, the pessary was replaced.              Cervix: no lesions                Chaperone was present for exam.  1. Pessary maintenance Doing well F/U in 3 months for a breast and pelvic exam with pessary check

## 2020-08-27 ENCOUNTER — Ambulatory Visit: Payer: Medicare Other

## 2020-08-27 DIAGNOSIS — M25611 Stiffness of right shoulder, not elsewhere classified: Secondary | ICD-10-CM | POA: Diagnosis not present

## 2020-08-27 DIAGNOSIS — M6281 Muscle weakness (generalized): Secondary | ICD-10-CM

## 2020-08-27 DIAGNOSIS — I972 Postmastectomy lymphedema syndrome: Secondary | ICD-10-CM | POA: Diagnosis not present

## 2020-08-27 NOTE — Therapy (Signed)
[place], [place], [address] Phone: [phone]   Fax:  [phone]  Physical Therapy Treatment  Patient Details  Name: Shawna [name] MRN: [id number] Date of Birth: Sep 11, 1940 Referring Provider (PT): Dr. Lindi Adie   Encounter Date: 08/27/2020   PT End of Session - 08/27/20 1509     Visit Number 15    Number of Visits 20    Date for PT Re-Evaluation 09/07/20    PT Start Time [id number]    PT Stop Time 1456    PT Time Calculation (min) 53 min    Activity Tolerance Patient tolerated treatment well    Behavior During Therapy [facility] for tasks assessed/performed             Past Medical History:  Diagnosis Date   Breast cancer ([place]) 11/2014   Family history of breast cancer    Family history of colon cancer    History of kidney stones    History of radiation therapy 08/11/15- 09/23/2015   Right Breast   History of radiation therapy 11/13/17- 12/29/17   Right chest wall and IM nodes 50.04 Gy in 28 fractions, Right supraclavicular and PAB nodes 50.04 Gy in 28 fractions, Right Chest wall boost/ 10 Gy in 5 fractions.    Hyperlipidemia    Hypertension    Monoallelic mutation of NBN gene    Osteoporosis    Skin cancer of nose     Past Surgical History:  Procedure Laterality Date   APPENDECTOMY     BREAST LUMPECTOMY WITH RADIOACTIVE SEED AND SENTINEL LYMPH NODE BIOPSY Right 02/03/2015   Procedure: RIGHT BREAST LUMPECTOMY WITH RADIOACTIVE SEED AND RIGHT SENTINEL LYMPH NODE BIOPSY;  Surgeon: [name], MD;  Location: [facility];  Service: General;  Laterality: Right;   BREAST SURGERY  11/2012   biopsy, benign breast tissue   COLONOSCOPY  06/2010   rec   LITHOTRIPSY     kidney stone on left side   MASTECTOMY W/ SENTINEL NODE BIOPSY Right 03/07/2017   Procedure: RIGHT TOTAL MASTECTOMY WITH RIGHT AXILLARY SENTINEL LYMPH NODE BIOPSY;  Surgeon: [name], MD;  Location: [facility];  Service:  General;  Laterality: Right;   OVARIAN CYST REMOVAL  age 36   POLYPECTOMY  04/2005   colon   PORTACATH PLACEMENT Left 02/03/2015   Procedure: INSERTION PORT-A-CATH WITH ULTRA SOUND GUIDANCE;  Surgeon: [name], MD;  Location: [facility];  Service: General;  Laterality: Left;   reexcision  10/09/2017   Dr. Lucia Gaskins, chest wall excision for recurrent breast cancer.     There were no vitals filed for this visit.   Subjective Assessment - 08/27/20 1405     Subjective I have been wearing the bra and the chip pack but I don't see any change with it.  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  Past history includes bilateral total hip replacements, hyperlipidemia, hypertension and osteoporosis    Patient Stated Goals to get rid of the swelling    Currently in Pain? No/denies                               [facility] Adult  PT Treatment/Exercise - 08/27/20 0001       Manual Therapy   Edema Management pt to continue with small chip pack through her next appt on Aug 22 reassessment    Myofascial Release MFR to right chest area, scar mobilization to improve scar mobility to try and decrease swelling    Manual Lymphatic Drainage (MLD) reviewed self manual lymph drainage in sitting with PT demonstrating and pt repeating.  Pt did well but still requires occasional VC's and TC's for sequence and pressure.    Passive ROM PROM right shoulder flex, abd, scaption                         PT Long Term Goals - 08/10/20 1051       PT LONG TERM GOAL #1   Title Pt will have decrease in Right arm swelling at 10 cm prox to ulnar styloid by 1 cm    Baseline 21.7 on  05/12/2020    Time 4    Period Weeks    Status Achieved      PT LONG TERM GOAL #2   Title Pt will report she knows lymphedema risk reduction practices and what to do to manage swelling    Time 4    Period Weeks    Status On-going    Target Date 09/07/20      PT LONG TERM GOAL #3   Title Pt will be independent in a home exericse program for shoulder ROM and strength    Period Weeks    Status On-going    Target Date 09/07/20      PT LONG TERM GOAL #4   Title Pt will have 140 degrees of right shoulder flexion so that she can perform her activities easier    Baseline 128  07/03/20    Time 4    Period Weeks    Status On-going    Target Date 09/07/20      PT LONG TERM GOAL #5   Title Pt will be fit for compression sleeve and will be independent with donning and doffing    Time 4    Period Weeks    Status Achieved      Additional Long Term Goals   Additional Long Term Goals Yes      PT LONG TERM GOAL #6   Title Pt will have 0- mild pitting edema at ulna border    Time 4    Period Weeks    Status New    Target Date 09/07/20      PT LONG TERM GOAL #7   Title Pt will have compression bra and swell spot or chip pack that can assist with decreasing swelling at right chest area    Time 4    Period Weeks    Status New    Target Date 09/07/20                   Plan - 08/27/20 1510     Clinical Impression Statement Very mild pitting noted at elbow today. She continues with compliance with her new bra, chip pack and compression sleeve.  There does appear to be slight decrease in swelling at right chest medial to axilla wtih more wrinkles noted.  We reviewed self MLD to the right UE with emphasis also on area at right chest. Pt did very well overall with occasional VC's for pressure and technique. She continues with significant radiation fibrosis at the  chest region and limited Shoulder ROM.    Personal Factors and Comorbidities Age;Comorbidity 3+    Comorbidities past  breast cancer surgery x 3, radiation, bilateral THR    Examination-Activity Limitations Reach Overhead    Examination-Participation Restrictions Yard Work    Stability/Clinical Decision Making Stable/Uncomplicated    Rehab Potential Good    PT Frequency 2x / week    PT Duration 4 weeks    PT Treatment/Interventions ADLs/Self Care Home Management;Therapeutic activities;Functional mobility training;Therapeutic exercise;Orthotic Fit/Training;Manual lymph drainage;Manual techniques;Patient/family education;Compression bandaging;Scar mobilization;Taping;Passive range of motion    PT Next Visit Plan Reasesess ROM, circumference, chip pack benefit    PT Home Exercise Plan MLD to right UE, compression sleeve, night tribute, compression bra with chip pack    Consulted and Agree with Plan of Care Patient             Patient will benefit from skilled therapeutic intervention in order to improve the following deficits and impairments:  Decreased range of motion, Decreased scar mobility, Increased edema, Postural dysfunction, Impaired UE functional use, Impaired flexibility, Increased fascial restricitons, Decreased strength, Pain, Decreased skin integrity  Visit Diagnosis: Postmastectomy lymphedema  Stiffness of right shoulder, not elsewhere classified  Muscle weakness (generalized)     Problem List Patient Active Problem List   Diagnosis Date Noted   Pain due to onychomycosis of toenails of both feet 06/17/2020   Pain in right knee 04/22/2019   Trochanteric bursitis of right hip 07/06/2017   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, right ([place]) 02/24/2017   Anemia 11/03/2015   Edema [id number]   Monoallelic mutation of NBN gene    Genetic testing 01/13/2015   Family history of breast cancer    Family history of colon cancer    Breast cancer of lower-inner quadrant of right female breast ([place]) 12/30/2014   Basal cell carcinoma of skin 10/30/2014   Abnormal results of liver function studies  10/30/2014   Callosity 10/30/2014   Age-related osteoporosis without current pathological fracture 07/31/2008   Essential hypertension 07/31/2008    Elsie Ra Ulyana Pitones 08/27/2020, 3:16 PM  [facility] [place], [place], [address] Phone: [phone]   Fax:  [phone]  Name: Shawna [name] MRN: [id number] Date of Birth: 11-29-1940 Cheral Almas, PT 08/27/20 3:17 PM

## 2020-09-07 ENCOUNTER — Other Ambulatory Visit: Payer: Self-pay

## 2020-09-07 ENCOUNTER — Ambulatory Visit: Payer: Medicare Other

## 2020-09-07 DIAGNOSIS — M25611 Stiffness of right shoulder, not elsewhere classified: Secondary | ICD-10-CM | POA: Diagnosis not present

## 2020-09-07 DIAGNOSIS — I972 Postmastectomy lymphedema syndrome: Secondary | ICD-10-CM

## 2020-09-07 DIAGNOSIS — M6281 Muscle weakness (generalized): Secondary | ICD-10-CM

## 2020-09-07 NOTE — Therapy (Addendum)
[place], [place], [address] Phone: [phone]   Fax:  [phone]  Physical Therapy Treatment  Patient Details  Name: Shawna [name] MRN: [id number] Date of Birth: 16-Aug-1940 Referring Provider (PT): Dr. Lindi Adie   Encounter Date: 09/07/2020   PT End of Session - 09/07/20 1936     Visit Number 16    Number of Visits 20    Date for PT Re-Evaluation 09/07/20    PT Start Time 3007    PT Stop Time 1500    PT Time Calculation (min) 57 min    Activity Tolerance Patient tolerated treatment well    Behavior During Therapy [facility] for tasks assessed/performed             Past Medical History:  Diagnosis Date   Breast cancer ([place]) 11/2014   Family history of breast cancer    Family history of colon cancer    History of kidney stones    History of radiation therapy 08/11/15- 09/23/2015   Right Breast   History of radiation therapy 11/13/17- 12/29/17   Right chest wall and IM nodes 50.04 Gy in 28 fractions, Right supraclavicular and PAB nodes 50.04 Gy in 28 fractions, Right Chest wall boost/ 10 Gy in 5 fractions.    Hyperlipidemia    Hypertension    Monoallelic mutation of NBN gene    Osteoporosis    Skin cancer of nose     Past Surgical History:  Procedure Laterality Date   APPENDECTOMY     BREAST LUMPECTOMY WITH RADIOACTIVE SEED AND SENTINEL LYMPH NODE BIOPSY Right 02/03/2015   Procedure: RIGHT BREAST LUMPECTOMY WITH RADIOACTIVE SEED AND RIGHT SENTINEL LYMPH NODE BIOPSY;  Surgeon: [name], MD;  Location: [place];  Service: General;  Laterality: Right;   BREAST SURGERY  11/2012   biopsy, benign breast tissue   COLONOSCOPY  06/2010   rec   LITHOTRIPSY     kidney stone on left side   MASTECTOMY W/ SENTINEL NODE BIOPSY Right 03/07/2017   Procedure: RIGHT TOTAL MASTECTOMY WITH RIGHT AXILLARY SENTINEL LYMPH NODE BIOPSY;  Surgeon: [name], MD;  Location: [place];  Service:  General;  Laterality: Right;   OVARIAN CYST REMOVAL  age 23   POLYPECTOMY  04/2005   colon   PORTACATH PLACEMENT Left 02/03/2015   Procedure: INSERTION PORT-A-CATH WITH ULTRA SOUND GUIDANCE;  Surgeon: [name], MD;  Location: [place];  Service: General;  Laterality: Left;   reexcision  10/09/2017   Dr. Lucia Gaskins, chest wall excision for recurrent breast cancer.     There were no vitals filed for this visit.   Subjective Assessment - 09/07/20 1403     Subjective Things are going well. Wearing sleeve all day and night garment every night. The chip pack doesn't stay in really well. Do MLD nearly every day.  The place at my chest hasn't changed much, but I do think it is not as red.    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  Past history includes bilateral total hip replacements, hyperlipidemia, hypertension and osteoporosis    Patient Stated Goals to get rid of the swelling    Currently in Pain? No/denies                [facility] PT  Assessment - 09/07/20 0001       Assessment   Medical Diagnosis right breast cancer    Referring Provider (PT) Dr. Lindi Adie    Onset Date/Surgical Date 10/18/14    Hand Dominance Right      Precautions   Precaution Comments lymphedema      Observation/Other Assessments   Observations pt with mild pitting at ulnar border, and area of fullness is still present medial to axilla. Compression Bra covers the area very well and despite using chip pack it has changed mildly but not gone away      AROM   Right Shoulder Flexion 135 Degrees    Right Shoulder ABduction 130 Degrees      Palpation   Palpation comment very tight mastecomy site with little skin excursion/radiation fibrosis                LYMPHEDEMA/ONCOLOGY QUESTIONNAIRE - 09/07/20 0001       Type   Cancer Type Right Breast Cancer      Surgeries   Mastectomy Date 03/07/17    Sentinel Lymph Node Biopsy Date 03/07/17    Number Lymph Nodes Removed 3      Right Upper Extremity Lymphedema   15 cm Proximal to Olecranon Process 27 cm    10 cm Proximal to Olecranon Process 26.8 cm    Olecranon Process 24.1 cm    15 cm Proximal to Ulnar Styloid Process 19.7 cm    10 cm Proximal to Ulnar Styloid Process 16.5 cm    Just Proximal to Ulnar Styloid Process 14.3 cm    Across Hand at [employer] 18.2 cm    At [place] of 2nd Digit 5.8 cm                        [facility] Adult PT Treatment/Exercise - 09/07/20 0001       Manual Therapy   Edema Management pt was compliant with compression bra and chip pack at right chest swelling just medial to arm. Area is less red and appears to have decreased slightly    Manual Lymphatic Drainage (MLD) reviewed self manual lymph drainage in sitting with pt performing all steps.  Pt did well but still requires occasional VC's and TC's for sequence and pressure.                         PT Long Term Goals - 09/07/20 1427       PT LONG TERM GOAL #1   Title Pt will have decrease in Right arm swelling at 10 cm prox to ulnar styloid by 1 cm    Time 4    Period Weeks    Status Achieved    Target Date 09/07/20      PT LONG TERM GOAL #2   Title Pt will report she knows lymphedema risk reduction practices and what to do to manage swelling    Time 4    Period Weeks    Status Achieved    Target Date 09/07/20      PT LONG TERM GOAL #3   Title Pt will be independent in a home exericse program for shoulder ROM and strength    Time 4    Period Weeks    Status Achieved      PT LONG TERM GOAL #4   Title Pt will have 140 degrees of right shoulder flexion so that she can perform her activities  easier    Baseline 135 today    Time 4    Period Weeks    Status Not  Met    Target Date 09/07/20      PT LONG TERM GOAL #5   Title Pt will be fit for compression sleeve and will be independent with donning and doffing    Time 4    Period Weeks    Status Achieved    Target Date 09/07/20      PT LONG TERM GOAL #6   Title Pt will have 0- mild pitting edema at ulna border    Baseline mild today    Time 4    Period Weeks    Status Achieved    Target Date 09/07/20      PT LONG TERM GOAL #7   Title Pt will have compression bra and swell spot or chip pack that can assist with decreasing swelling at right chest area    Period Weeks    Status Achieved    Target Date 09/07/20                   Plan - 09/07/20 1938     Clinical Impression Statement pt has been very compliant with her new compression bra, using compression sleeve in the daytime and tribute night in the evening.  She has been doing MLD most days and is keeping up with her ROM exs.  Pitting at ulnar border is still present but very minimal.  She has improved shoulder ROM by 10 degrees actively for shoulder flexion and abduction.  Swelling medial to arm at chest region is still present and slightly swollen, but less red.  Per prior US it was mostly a normal fat pad with overlying normal subcutaneous tissues. She is independent in self management and has achieved all goals established except shoulder ROM 140 Degrees.  She achieved 135 for shoulder flexion. She is discharged from formal PT at this time, but was advised to call if she has any problems or questions.    Personal Factors and Comorbidities Age;Comorbidity 3+    Comorbidities past breast cancer surgery x 3, radiation, bilateral THR    Examination-Activity Limitations Reach Overhead    Examination-Participation Restrictions Yard Work    Stability/Clinical Decision Making Stable/Uncomplicated    Rehab Potential Good    PT Frequency 2x / week    PT Duration 4 weeks    PT Treatment/Interventions ADLs/Self Care Home  Management;Therapeutic activities;Functional mobility training;Therapeutic exercise;Orthotic Fit/Training;Manual lymph drainage;Manual techniques;Patient/family education;Compression bandaging;Scar mobilization;Taping;Passive range of motion    PT Next Visit Plan DC to HEP    PT Home Exercise Plan MLD to right UE, compression sleeve, night tribute, compression bra with chip pack    Consulted and Agree with Plan of Care Patient             Patient will benefit from skilled therapeutic intervention in order to improve the following deficits and impairments:  Decreased range of motion, Decreased scar mobility, Increased edema, Postural dysfunction, Impaired UE functional use, Impaired flexibility, Increased fascial restricitons, Decreased strength, Pain, Decreased skin integrity  Visit Diagnosis: Postmastectomy lymphedema  Stiffness of right shoulder, not elsewhere classified  Muscle weakness (generalized)     Problem List Patient Active Problem List   Diagnosis Date Noted   Pain due to onychomycosis of toenails of both feet 06/17/2020   Pain in right knee 04/22/2019   Trochanteric bursitis of right hip 07/06/2017   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,  right ([place]) 02/24/2017   Anemia 11/03/2015   Edema 92/49/3241   Monoallelic mutation of NBN gene    Genetic testing 01/13/2015   Family history of breast cancer    Family history of colon cancer    Breast cancer of lower-inner quadrant of right female breast ([place]) 12/30/2014   Basal cell carcinoma of skin 10/30/2014   Abnormal results of liver function studies 10/30/2014   Callosity 10/30/2014   Age-related osteoporosis without current pathological fracture 07/31/2008   Essential hypertension 07/31/2008  PHYSICAL THERAPY DISCHARGE SUMMARY  Visits from Start of Care: 16  Current functional level related to goals / functional outcomes: Achieved all but ROM goal   Remaining deficits: Swelling at right chest medial to shoulder  likely Fat pad per [place], Decreased shoulder ROM   Education / Equipment: Compression sleeve, Tribute night,compression bra  Patient agrees to discharge. Patient goals were partially met. Patient is being discharged due to being pleased with the current functional level.   Claris Pong 09/07/2020, 7:49 PM  [place], [place], [address] Phone: [phone]   Fax:  [phone]  Name: Shawna [name] MRN: [id number] Date of Birth: [birth year] Cheral Almas, PT 09/07/20 7:51 PM  Cheral Almas, PT 09/07/20 7:56 PM

## 2020-09-08 DIAGNOSIS — Z961 Presence of intraocular lens: Secondary | ICD-10-CM | POA: Diagnosis not present

## 2020-09-08 DIAGNOSIS — H26493 Other secondary cataract, bilateral: Secondary | ICD-10-CM | POA: Diagnosis not present

## 2020-09-08 DIAGNOSIS — H26492 Other secondary cataract, left eye: Secondary | ICD-10-CM | POA: Diagnosis not present

## 2020-09-08 DIAGNOSIS — H18413 Arcus senilis, bilateral: Secondary | ICD-10-CM | POA: Diagnosis not present

## 2020-09-18 DIAGNOSIS — Z961 Presence of intraocular lens: Secondary | ICD-10-CM | POA: Diagnosis not present

## 2020-09-23 DIAGNOSIS — C50911 Malignant neoplasm of unspecified site of right female breast: Secondary | ICD-10-CM | POA: Diagnosis not present

## 2020-10-05 DIAGNOSIS — M799 Soft tissue disorder, unspecified: Secondary | ICD-10-CM | POA: Diagnosis not present

## 2020-10-05 DIAGNOSIS — I890 Lymphedema, not elsewhere classified: Secondary | ICD-10-CM | POA: Diagnosis not present

## 2020-10-05 DIAGNOSIS — R071 Chest pain on breathing: Secondary | ICD-10-CM | POA: Diagnosis not present

## 2020-10-05 DIAGNOSIS — C50311 Malignant neoplasm of lower-inner quadrant of right female breast: Secondary | ICD-10-CM | POA: Diagnosis not present

## 2020-10-07 ENCOUNTER — Other Ambulatory Visit: Payer: Self-pay

## 2020-10-07 ENCOUNTER — Other Ambulatory Visit: Payer: Self-pay | Admitting: Internal Medicine

## 2020-10-07 ENCOUNTER — Ambulatory Visit
Admission: RE | Admit: 2020-10-07 | Discharge: 2020-10-07 | Disposition: A | Discharge status: Home / Self Care | Payer: Medicare Other | Source: Ambulatory Visit | Attending: Internal Medicine | Admitting: Internal Medicine

## 2020-10-07 DIAGNOSIS — R918 Other nonspecific abnormal finding of lung field: Secondary | ICD-10-CM | POA: Diagnosis not present

## 2020-10-07 DIAGNOSIS — R071 Chest pain on breathing: Secondary | ICD-10-CM

## 2020-10-07 DIAGNOSIS — R0602 Shortness of breath: Secondary | ICD-10-CM

## 2020-10-07 DIAGNOSIS — I700 Atherosclerosis of aorta: Secondary | ICD-10-CM | POA: Diagnosis not present

## 2020-10-07 DIAGNOSIS — J984 Other disorders of lung: Secondary | ICD-10-CM | POA: Diagnosis not present

## 2020-10-07 MED ORDER — IOPAMIDOL (ISOVUE-370) INJECTION 76%
75.0000 mL | Freq: Once | INTRAVENOUS | Status: AC | PRN
  Administered 2020-10-07: 60 mL via INTRAVENOUS

## 2020-10-08 ENCOUNTER — Telehealth: Payer: Self-pay | Admitting: *Deleted

## 2020-10-08 NOTE — Telephone Encounter (Signed)
Luke states she has been having "stabbing pain in my arm pit around to back, I am also having some shortness of breath with deep breathing".  Had 'negative ' CT angio chest yesterday ordered by PCP Dr Virgina Jock.   She wants to know if this is due to lymphedema and will she "have to live with this". RN asked if she wanted to return to PT, pt states she just finished in August and does not feel that will make any difference.

## 2020-10-14 ENCOUNTER — Telehealth: Payer: Self-pay | Admitting: *Deleted

## 2020-10-14 NOTE — Telephone Encounter (Signed)
Received call from pt stating right sided arm and back pain has resolved.  Pt does stat that she continues to experience slight pain with inspiration.  MD reviewed recent CT angio which showed scarring on right lung. Pt with hx of radiation therapy to right side of chest.  Per MD pt to take Ibuprofen and decrease activity over the next week and schedule a f/u visit if symptoms do not resolve with Mendel Ryder, NP. Pt verbalized understanding and states she will update our office.

## 2020-10-21 DIAGNOSIS — M25561 Pain in right knee: Secondary | ICD-10-CM | POA: Diagnosis not present

## 2020-10-21 DIAGNOSIS — M1711 Unilateral primary osteoarthritis, right knee: Secondary | ICD-10-CM | POA: Diagnosis not present

## 2020-11-03 DIAGNOSIS — H26491 Other secondary cataract, right eye: Secondary | ICD-10-CM | POA: Diagnosis not present

## 2020-11-10 DIAGNOSIS — Z961 Presence of intraocular lens: Secondary | ICD-10-CM | POA: Diagnosis not present

## 2020-11-14 DIAGNOSIS — Z23 Encounter for immunization: Secondary | ICD-10-CM | POA: Diagnosis not present

## 2020-11-24 NOTE — Progress Notes (Signed)
80 y.o. G84P2002 Widowed White or Caucasian Not Hispanic or Latino female here for annual exam and for pessary check. She has a #3 cube pessary for a cystocele and uterine prolapse. Using vaginal estrogen 1 x a week.  Prolapse is well controlled. No bleeding.   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 She met with the plastic surgeon about reconstruction and she decided against it.  H/O urge incontinence, stable, tolerable. She wears a pad when she goes out. Leaks small amounts trying to get to the bathroom.   No bowel c/o.    Patient's last menstrual period was 01/17/1982 (approximate).          Sexually active: No.  The current method of family planning is post menopausal status.    Exercising: Yes.     Walking daily  Smoker:  no  Health Maintenance: Pap:  02-24-15 WNL  History of abnormal Pap:  no MMG:  06/12/20 [place] Right Bi-rads 1 neg Mammo 02/24/20 Bi-rads 1 neg  BMD:   osteopenia on DEXA at [facility]. Got one prolia shot, didn't like it. Will f/u there.  Colonoscopy: 08/12/16 polyps  TDaP:  10/13/11  Gardasil: n/a   reports that she has never smoked. She has never used smokeless tobacco. She reports current alcohol use. She reports that she does not use drugs. 1 glass of wine a night. Son is living with her and her Daughter is in [place]. Son is disabled after multiple back surgeries. She has 4 step-grandchildren (late husbands grandchildren), local.  Past Medical History:  Diagnosis Date   Breast cancer ([place]) 11/2014   Family history of breast cancer    Family  history of colon cancer    History of kidney stones    History of radiation therapy 08/11/15- 09/23/2015   Right Breast   History of radiation therapy 11/13/17- 12/29/17   Right chest wall and IM nodes 50.04 Gy in 28 fractions, Right supraclavicular and PAB nodes 50.04 Gy in 28 fractions, Right Chest wall boost/ 10 Gy in 5 fractions.    Hyperlipidemia    Hypertension    Monoallelic mutation of NBN gene    Osteoporosis    Skin cancer of nose     Past Surgical History:  Procedure Laterality Date   APPENDECTOMY     BREAST LUMPECTOMY WITH RADIOACTIVE SEED AND SENTINEL LYMPH NODE BIOPSY Right 02/03/2015   Procedure: RIGHT BREAST LUMPECTOMY WITH RADIOACTIVE SEED AND RIGHT SENTINEL LYMPH NODE BIOPSY;  Surgeon: [name], MD;  Location: [place];  Service: General;  Laterality: Right;   BREAST SURGERY  11/2012   biopsy, benign breast tissue   COLONOSCOPY  06/2010   rec   LITHOTRIPSY     kidney stone on left side   MASTECTOMY W/ SENTINEL NODE BIOPSY Right 03/07/2017   Procedure: RIGHT TOTAL MASTECTOMY WITH RIGHT AXILLARY SENTINEL LYMPH NODE BIOPSY;  Surgeon: [name], MD;  Location: [place];  Service: General;  Laterality: Right;   OVARIAN CYST REMOVAL  age 45   POLYPECTOMY  04/2005   colon   PORTACATH PLACEMENT Left 02/03/2015   Procedure: INSERTION  PORT-A-CATH WITH ULTRA SOUND GUIDANCE;  Surgeon: [name], MD;  Location: [place];  Service: General;  Laterality: Left;   reexcision  10/09/2017   Dr. Lucia Gaskins, chest wall excision for recurrent breast cancer.     Current Outpatient Medications  Medication Sig Dispense Refill   Biotin 5000 MCG CAPS Take by mouth.      calcium-vitamin D (OSCAL WITH D) 250-125 MG-UNIT tablet Take 1 tablet by mouth daily. Patient takes calcium 1227m plus vitamin D3 1,000 IU      cholecalciferol (VITAMIN D) 1000 units tablet Take 2,000 Units by mouth daily.      CRESTOR 20 MG tablet Take 1 tablet by mouth daily.   0    estradiol (ESTRACE) 0.1 MG/GM vaginal cream USE 1 GRAM VAGINALLY 2 TIMES A WEEK at bedtime 42.5 g 1   hydrochlorothiazide (MICROZIDE) 12.5 MG capsule Take 12.5 mg by mouth daily.      losartan (COZAAR) 100 MG tablet Take 100 mg by mouth daily.   0   No current facility-administered medications for this visit.    Family History  Problem Relation Age of Onset   Colon cancer Sister 457      died at 410  Breast cancer Mother 772  Hypertension Mother    Osteoporosis Mother    Heart disease Father        CABG   Dementia Father    Throat cancer Brother [phone]  Breast cancer Sister 441  Lymphoma Sister 481  Melanoma Sister    Skin cancer Brother    Skin cancer Daughter     Review of Systems  All other systems reviewed and are negative.  Exam:   BP 140/80   Pulse 72   Ht _0  (1.727 m)   Wt 148 lb 12.8 oz (67.5 kg)   LMP 01/17/1982 (Approximate)   SpO2 98%   BMI 22.62 kg/m   Weight change: _1 @ Height:   Height: _2  (172.7 cm)  Ht Readings from Last 3 Encounters:  11/25/20 _3  (1.727 m)  07/24/20 5' 8.5" (1.74 m)  05/27/20 5' 8.5" (1.74 m)    General appearance: alert, cooperative and appears stated age Head: Normocephalic, without obvious abnormality, atraumatic Neck: no adenopathy, supple, symmetrical, trachea midline and thyroid normal to inspection and palpation Lungs: clear to auscultation bilaterally Cardiovascular: regular rate and rhythm Breasts:  left breast without lumps, right chest wall without lumps (s/p mastectomy), no skin changes.  Abdomen: soft, non-tender; non distended,  no masses,  no organomegaly Extremities: extremities normal, atraumatic, no cyanosis or edema Skin: Skin color, texture, turgor normal. No rashes or lesions Lymph nodes: Cervical, supraclavicular, and axillary nodes normal. No abnormal inguinal nodes palpated Neurologic: Grossly normal   Pelvic: External genitalia:  no lesions              Urethra:  normal appearing  urethra with no masses, tenderness or lesions              Bartholins and Skenes: normal                 Vagina: the pessary was removed and cleaned, no vaginal irritation. The pessary was replaced.              Cervix: no lesions               Bimanual Exam:  Uterus:  normal size, contour, position, consistency, mobility, non-tender  Adnexa: no mass, fullness, tenderness               Rectovaginal: Confirms               Anus:  normal sphincter tone, no lesions  Gae Dry chaperoned for the exam.   1. Well woman exam Labs with primary Colonoscopy UTD DEXA with primary  2. Urge incontinence Stable but bothersome - oxybutynin (DITROPAN XL) 5 MG 24 hr tablet; Take 1 tablet (5 mg total) by mouth at bedtime.  Dispense: 90 tablet; Refill: 0 -F/U in 3 months, reach out before then if she wants a higher dose.   3. Vaginal atrophy On vaginal estrogen, doesn't need a refill yet.   4. Pessary maintenance Doing well F/u in 3 months  5. History of breast cancer Mammogram is scheduled Doing breast exams

## 2020-11-25 ENCOUNTER — Encounter: Payer: Self-pay | Admitting: Obstetrics and Gynecology

## 2020-11-25 ENCOUNTER — Other Ambulatory Visit: Payer: Self-pay

## 2020-11-25 ENCOUNTER — Ambulatory Visit (INDEPENDENT_AMBULATORY_CARE_PROVIDER_SITE_OTHER): Payer: Medicare Other | Admitting: Obstetrics and Gynecology

## 2020-11-25 VITALS — BP 140/80 | HR 72 | Ht 68.0 in | Wt 148.8 lb

## 2020-11-25 DIAGNOSIS — R0600 Dyspnea, unspecified: Secondary | ICD-10-CM | POA: Insufficient documentation

## 2020-11-25 DIAGNOSIS — Z4689 Encounter for fitting and adjustment of other specified devices: Secondary | ICD-10-CM

## 2020-11-25 DIAGNOSIS — N952 Postmenopausal atrophic vaginitis: Secondary | ICD-10-CM | POA: Diagnosis not present

## 2020-11-25 DIAGNOSIS — N3941 Urge incontinence: Secondary | ICD-10-CM | POA: Diagnosis not present

## 2020-11-25 DIAGNOSIS — Z853 Personal history of malignant neoplasm of breast: Secondary | ICD-10-CM

## 2020-11-25 DIAGNOSIS — Z01419 Encounter for gynecological examination (general) (routine) without abnormal findings: Secondary | ICD-10-CM | POA: Diagnosis not present

## 2020-11-25 DIAGNOSIS — I890 Lymphedema, not elsewhere classified: Secondary | ICD-10-CM | POA: Insufficient documentation

## 2020-11-25 MED ORDER — OXYBUTYNIN CHLORIDE ER 5 MG PO TB24: 5.0000 mg | ORAL_TABLET | Freq: Every day | ORAL | 0 refills | Status: DC

## 2020-11-25 NOTE — Patient Instructions (Signed)

## 2020-11-27 DIAGNOSIS — E785 Hyperlipidemia, unspecified: Secondary | ICD-10-CM | POA: Diagnosis not present

## 2020-11-27 DIAGNOSIS — I10 Essential (primary) hypertension: Secondary | ICD-10-CM | POA: Diagnosis not present

## 2020-11-27 DIAGNOSIS — E559 Vitamin D deficiency, unspecified: Secondary | ICD-10-CM | POA: Diagnosis not present

## 2020-12-02 DIAGNOSIS — R82998 Other abnormal findings in urine: Secondary | ICD-10-CM | POA: Diagnosis not present

## 2020-12-02 DIAGNOSIS — I10 Essential (primary) hypertension: Secondary | ICD-10-CM | POA: Diagnosis not present

## 2020-12-02 DIAGNOSIS — Z0000 Encounter for general adult medical examination without abnormal findings: Secondary | ICD-10-CM | POA: Diagnosis not present

## 2020-12-02 DIAGNOSIS — I972 Postmastectomy lymphedema syndrome: Secondary | ICD-10-CM | POA: Diagnosis not present

## 2020-12-02 DIAGNOSIS — Z1212 Encounter for screening for malignant neoplasm of rectum: Secondary | ICD-10-CM | POA: Diagnosis not present

## 2020-12-02 DIAGNOSIS — N1831 Chronic kidney disease, stage 3a: Secondary | ICD-10-CM | POA: Diagnosis not present

## 2021-01-04 DIAGNOSIS — C50911 Malignant neoplasm of unspecified site of right female breast: Secondary | ICD-10-CM | POA: Diagnosis not present

## 2021-01-05 DIAGNOSIS — C50911 Malignant neoplasm of unspecified site of right female breast: Secondary | ICD-10-CM | POA: Diagnosis not present

## 2021-01-15 ENCOUNTER — Other Ambulatory Visit: Payer: Self-pay

## 2021-01-15 ENCOUNTER — Ambulatory Visit: Payer: Medicare Other | Admitting: Podiatry

## 2021-01-15 ENCOUNTER — Encounter: Payer: Self-pay | Admitting: Podiatry

## 2021-01-15 DIAGNOSIS — G620 Drug-induced polyneuropathy: Secondary | ICD-10-CM

## 2021-01-15 DIAGNOSIS — T451X5A Adverse effect of antineoplastic and immunosuppressive drugs, initial encounter: Secondary | ICD-10-CM

## 2021-01-15 DIAGNOSIS — L84 Corns and callosities: Secondary | ICD-10-CM | POA: Diagnosis not present

## 2021-01-15 NOTE — Progress Notes (Signed)
Subjective:   Patient ID: Shawna [name], female   DOB: 80 y.o.   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

## 2021-02-16 ENCOUNTER — Encounter: Payer: Self-pay | Admitting: *Deleted

## 2021-02-16 NOTE — Progress Notes (Signed)
RN successfully faxed signed medical clearance for right total knee to Dr. Alvan Dame [phone]).

## 2021-02-20 ENCOUNTER — Other Ambulatory Visit: Payer: Self-pay | Admitting: Obstetrics and Gynecology

## 2021-02-20 DIAGNOSIS — N3941 Urge incontinence: Secondary | ICD-10-CM

## 2021-02-22 NOTE — Telephone Encounter (Signed)
3 month follow up scheduled on 02/25/21

## 2021-02-25 ENCOUNTER — Other Ambulatory Visit: Payer: Self-pay

## 2021-02-25 ENCOUNTER — Encounter: Payer: Self-pay | Admitting: Obstetrics and Gynecology

## 2021-02-25 ENCOUNTER — Ambulatory Visit: Payer: Medicare Other | Admitting: Obstetrics and Gynecology

## 2021-02-25 VITALS — BP 108/64 | HR 66 | Wt 147.0 lb

## 2021-02-25 DIAGNOSIS — N3941 Urge incontinence: Secondary | ICD-10-CM

## 2021-02-25 DIAGNOSIS — Z4689 Encounter for fitting and adjustment of other specified devices: Secondary | ICD-10-CM

## 2021-02-25 MED ORDER — MIRABEGRON ER 25 MG PO TB24: 25.0000 mg | ORAL_TABLET | Freq: Every day | ORAL | 1 refills | Status: DC

## 2021-02-25 NOTE — Patient Instructions (Signed)

## 2021-02-25 NOTE — Progress Notes (Signed)
GYNECOLOGY  VISIT   HPI: 81 y.o.   Widowed White or Caucasian Not Hispanic or Latino  female   [phone] with Patient's last menstrual period was 01/17/1982 (approximate).   here for  pessary follow up. No c/o. No bleeding. No difficulty voiding or having BM's.   At her annual exam in 11/22 she was c/o mild urge incontinence and was started on ditropan. She states that she only took the ditropan for a week because it make her feel dizzy and did not seem to help with the urine leakage.   She is having a knee replacement in 4/23.   GYNECOLOGIC HISTORY: Patient's last menstrual period was 01/17/1982 (approximate). Contraception:pmp  Menopausal hormone therapy: estrace         OB History     Gravida  2   Para  2   Term  2   Preterm  0   AB  0   Living  2      SAB  0   IAB  0   Ectopic  0   Multiple  0   Live Births  2              Patient Active Problem List   Diagnosis Date Noted   Lymphedema 11/25/2020   Dyspnea 11/25/2020   Pain due to onychomycosis of toenails of both feet 06/17/2020   Pain in right knee 04/22/2019   Trochanteric bursitis of right hip 07/06/2017   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, right ([place]) 02/24/2017   Anemia 11/03/2015   Edema 87/56/4332   Monoallelic mutation of NBN gene    Genetic testing 01/13/2015   Family history of breast cancer    Family history of colon cancer    Breast cancer of lower-inner quadrant of right female breast ([place]) 12/30/2014   Basal cell carcinoma of skin 10/30/2014   Abnormal results of liver function studies 10/30/2014   Callosity 10/30/2014   Age-related osteoporosis without current pathological fracture 07/31/2008   Essential hypertension 07/31/2008    Past Medical History:  Diagnosis Date   Breast cancer ([place]) 11/2014   Family history of breast cancer    Family history of colon cancer    History of kidney stones    History of radiation therapy 08/11/15- 09/23/2015   Right Breast   History of  radiation therapy 11/13/17- 12/29/17   Right chest wall and IM nodes 50.04 Gy in 28 fractions, Right supraclavicular and PAB nodes 50.04 Gy in 28 fractions, Right Chest wall boost/ 10 Gy in 5 fractions.    Hyperlipidemia    Hypertension    Monoallelic mutation of NBN gene    Osteoporosis    Skin cancer of nose     Past Surgical History:  Procedure Laterality Date   APPENDECTOMY     BREAST LUMPECTOMY WITH RADIOACTIVE SEED AND SENTINEL LYMPH NODE BIOPSY Right 02/03/2015   Procedure: RIGHT BREAST LUMPECTOMY WITH RADIOACTIVE SEED AND RIGHT SENTINEL LYMPH NODE BIOPSY;  Surgeon: [name], MD;  Location: [facility];  Service: General;  Laterality: Right;   BREAST SURGERY  11/2012   biopsy, benign breast tissue   COLONOSCOPY  06/2010   rec   LITHOTRIPSY     kidney stone on left side   MASTECTOMY W/ SENTINEL NODE BIOPSY Right 03/07/2017   Procedure: RIGHT TOTAL MASTECTOMY WITH RIGHT AXILLARY SENTINEL LYMPH NODE BIOPSY;  Surgeon: [name], MD;  Location: [facility];  Service: General;  Laterality: Right;   OVARIAN CYST REMOVAL  age 45   POLYPECTOMY  04/2005   colon   PORTACATH PLACEMENT Left 02/03/2015   Procedure: INSERTION PORT-A-CATH WITH ULTRA SOUND GUIDANCE;  Surgeon: [name], MD;  Location: [facility];  Service: General;  Laterality: Left;   reexcision  10/09/2017   Dr. Lucia Gaskins, chest wall excision for recurrent breast cancer.     Current Outpatient Medications  Medication Sig Dispense Refill   Biotin 5000 MCG CAPS Take by mouth.      calcium-vitamin D (OSCAL WITH D) 250-125 MG-UNIT tablet Take 1 tablet by mouth daily. Patient takes calcium 1200mg  plus vitamin D3 1,000 IU      cholecalciferol (VITAMIN D) 1000 units tablet Take 2,000 Units by mouth daily.      CRESTOR 20 MG tablet Take 1 tablet by mouth daily.   0   estradiol (ESTRACE) 0.1 MG/GM vaginal cream USE 1 GRAM VAGINALLY 2 TIMES A WEEK at bedtime 42.5 g 1   hydrochlorothiazide (MICROZIDE)  12.5 MG capsule Take 12.5 mg by mouth daily.      losartan (COZAAR) 100 MG tablet Take 100 mg by mouth daily.   0   oxybutynin (DITROPAN-XL) 5 MG 24 hr tablet TAKE 1 TABLET(5 MG) BY MOUTH AT BEDTIME (Patient not taking: Reported on 02/25/2021) 90 tablet 0   No current facility-administered medications for this visit.     ALLERGIES: Patient has no known allergies.  Family History  Problem Relation Age of Onset   Colon cancer Sister 38       died at 8   Breast cancer Mother 74   Hypertension Mother    Osteoporosis Mother    Heart disease Father        CABG   Dementia Father    Throat cancer Brother 61   Breast cancer Sister 89   Lymphoma Sister 39   Melanoma Sister    Skin cancer Brother    Skin cancer Daughter     Social History   Socioeconomic History   Marital status: Widowed    Spouse name: Not on file   Number of children: 2   Years of education: Not on file   Highest education level: Not on file  Occupational History   Not on file  Tobacco Use   Smoking status: Never   Smokeless tobacco: Never  Vaping Use   Vaping Use: Never used  Substance and Sexual Activity   Alcohol use: Yes    Comment: rarely   Drug use: No   Sexual activity: Not Currently    Partners: Male    Birth control/protection: Post-menopausal  Other Topics Concern   Not on file  Social History Narrative   Not on file   Social Determinants of Health   Financial Resource Strain: Not on file  Food Insecurity: Not on file  Transportation Needs: Not on file  Physical Activity: Not on file  Stress: Not on file  Social Connections: Not on file  Intimate Partner Violence: Not on file    Review of Systems  All other systems reviewed and are negative.  PHYSICAL EXAMINATION:    BP 108/64    Pulse 66    Wt 147 lb (66.7 kg)    LMP 01/17/1982 (Approximate)    SpO2 98%    BMI 22.35 kg/m     General appearance: alert, cooperative and appears stated age  Pelvic: External genitalia:  no  lesions              Urethra:  normal  appearing urethra with no masses, tenderness or lesions              Bartholins and Skenes: normal                 Vagina: the pessary was removed and cleaned. No vaginal irritation. The pessary was replaced.               Cervix: + ectropion  Chaperone was present for exam.  1. Pessary maintenance Doing well with the cube pessary F/U in 4 months (having knee surgery, 3 months is too soon)  2. Urge incontinence Didn't tolerate ditropan - mirabegron ER (MYRBETRIQ) 25 MG TB24 tablet; Take 1 tablet (25 mg total) by mouth daily. One po qd  Dispense: 30 tablet; Refill: 1 (If too expensive, may try the 50 mg tablets and cut them in half).

## 2021-03-01 DIAGNOSIS — Z1231 Encounter for screening mammogram for malignant neoplasm of breast: Secondary | ICD-10-CM | POA: Diagnosis not present

## 2021-03-04 ENCOUNTER — Encounter: Payer: Self-pay | Admitting: Obstetrics and Gynecology

## 2021-04-16 DIAGNOSIS — M1711 Unilateral primary osteoarthritis, right knee: Secondary | ICD-10-CM | POA: Diagnosis not present

## 2021-04-19 NOTE — Progress Notes (Addendum)
DUE TO COVID-19 ONLY ONE VISITOR IS ALLOWED TO COME WITH YOU AND STAY IN THE WAITING ROOM ONLY DURING PRE OP AND PROCEDURE DAY OF SURGERY.  2 VISITOR  MAY VISIT WITH YOU AFTER SURGERY IN YOUR PRIVATE ROOM DURING VISITING HOURS ONLY! ?YOU MAY HAVE ONE PERSON SPEND THE NITE WITH YOU IN YOUR ROOM AFTER SURGERY.   ? ? Your procedure is scheduled on:  ? 05/04/2021  ? Report to [facility] Main  Entrance ? ? Report to admitting at       0515           AM ?DO NOT [place], PICTURE ID OR WALLET DAY OF SURGERY.  ?  ? ? Call this number if you have problems the morning of surgery [phone]  ? ? REMEMBER: NO  SOLID FOODS , CANDY, GUM OR MINTS AFTER MIDNITE THE NITE BEFORE SURGERY .       Marland Kitchen CLEAR LIQUIDS UNTIL     0415am             DAY OF SURGERY.      PLEASE FINISH ENSURE DRINK PER SURGEON ORDER  WHICH NEEDS TO BE COMPLETED AT     0415am       MORNING OF SURGERY.   ? ? ? ? ?CLEAR LIQUID DIET ? ? ?Foods Allowed      ?WATER ?BLACK COFFEE ( SUGAR OK, NO MILK, CREAM OR CREAMER) REGULAR AND DECAF  ?TEA ( SUGAR OK NO MILK, CREAM, OR CREAMER) REGULAR AND DECAF  ?PLAIN JELLO ( NO RED)  ?FRUIT ICES ( NO RED, NO FRUIT PULP)  ?POPSICLES ( NO RED)  ?JUICE- APPLE, WHITE GRAPE AND WHITE CRANBERRY  ?SPORT DRINK LIKE GATORADE ( NO RED)  ?CLEAR BROTH ( VEGETABLE , CHICKEN OR BEEF)                                                               ? ?    ? ?BRUSH YOUR TEETH MORNING OF SURGERY AND RINSE YOUR MOUTH OUT, NO CHEWING GUM CANDY OR MINTS. ?  ? ? Take these medicines the morning of surgery with A SIP OF WATER: none  ? ? ?DO NOT TAKE ANY DIABETIC MEDICATIONS DAY OF YOUR SURGERY ?                  ?            You may not have any metal on your body including hair pins and  ?            piercings  Do not wear jewelry, make-up, lotions, powders or perfumes, deodorant ?            Do not wear nail polish on your fingernails.   ?           IF YOU ARE A FEMALE AND WANT TO SHAVE UNDER ARMS OR LEGS PRIOR TO SURGERY YOU MUST  DO SO AT LEAST 48 HOURS PRIOR TO SURGERY.  ?            Men may shave face and neck. ? ? Do not bring valuables to the hospital. [facility] NOT ?            RESPONSIBLE   FOR  VALUABLES. ? Contacts, dentures or bridgework may not be worn into surgery. ? Leave suitcase in the car. After surgery it may be brought to your room. ? ?  ? Patients discharged the day of surgery will not be allowed to drive home. IF YOU ARE HAVING SURGERY AND GOING HOME THE SAME DAY, YOU MUST HAVE AN ADULT TO DRIVE YOU HOME AND BE WITH YOU FOR 24 HOURS. YOU MAY GO HOME BY TAXI OR UBER OR ORTHERWISE, BUT AN ADULT MUST ACCOMPANY YOU HOME AND STAY WITH YOU FOR 24 HOURS. ?  ? ?            Please read over the following fact sheets you were given: ?_____________________________________________________________________ ? ?[facility] - Preparing for Surgery ?Before surgery, you can play an important role.  Because skin is not sterile, your skin needs to be as free of germs as possible.  You can reduce the number of germs on your skin by washing with CHG (chlorahexidine gluconate) soap before surgery.  CHG is an antiseptic cleaner which kills germs and bonds with the skin to continue killing germs even after washing. ?Please DO NOT use if you have an allergy to CHG or antibacterial soaps.  If your skin becomes reddened/irritated stop using the CHG and inform your nurse when you arrive at Short Stay. ?Do not shave (including legs and underarms) for at least 48 hours prior to the first CHG shower.  You may shave your face/neck. ?Please follow these instructions carefully: ? 1.  Shower with CHG Soap the night before surgery and the  morning of Surgery. ? 2.  If you choose to wash your hair, wash your hair first as usual with your  normal  shampoo. ? 3.  After you shampoo, rinse your hair and body thoroughly to remove the  shampoo.                           4.  Use CHG as you would any other liquid soap.  You can apply chg directly  to the skin and  wash  ?                     Gently with a scrungie or clean washcloth. ? 5.  Apply the CHG Soap to your body ONLY FROM THE NECK DOWN.   Do not use on face/ open      ?                     Wound or open sores. Avoid contact with eyes, ears mouth and genitals (private parts).  ?                     Production manager,  Genitals (private parts) with your normal soap. ?            6.  Wash thoroughly, paying special attention to the area where your surgery  will be performed. ? 7.  Thoroughly rinse your body with warm water from the neck down. ? 8.  DO NOT shower/wash with your normal soap after using and rinsing off  the CHG Soap. ?               9.  Pat yourself dry with a clean towel. ?           10.  Wear clean pajamas. ?  11.  Place clean sheets on your bed the night of your first shower and do not  sleep with pets. ?Day of Surgery : ?Do not apply any lotions/deodorants the morning of surgery.  Please wear clean clothes to the hospital/surgery center. ? ?FAILURE TO FOLLOW THESE INSTRUCTIONS MAY RESULT IN THE CANCELLATION OF YOUR SURGERY ?PATIENT SIGNATURE_________________________________ ? ?NURSE SIGNATURE__________________________________ ? ?________________________________________________________________________  ? ? ?           ?

## 2021-04-19 NOTE — Progress Notes (Addendum)
Anesthesia Review: ? ?PCP: DR [name]  Clearance- 02/24/21 on chart  LOV 12/02/20 on chart  ?Cardiologist : none  ?Chest x-ray : ?10/07/20- Ct Angio chest  ?EKG : 04/22/21  ?Echo : 2017  ?Stress test: ?Cardiac Cath :  ?Activity level: can do a flgiht of stairs without difficulty  ?Sleep Study/ CPAP : none  ?Fasting Blood Sugar :      / Checks Blood Sugar -- times a day:   ?Blood Thinner/ Instructions /Last Dose: ?ASA / Instructions/ Last Dose :   ?On 04/22/21- pt reports at preop appt having a sharp pain at right shoulder area on 04/16/21 after leaving Dr Aurea Graff office, also on 04/17/21 and 04/18/21.  PT states it has not occurred since.  PT reports " being the worst pain I have ever had" .  Instructed pt to call Dr [name] and make appt to be examined prior to surgery on 05/04/21.  PT voiced understanding. PT with hx of right breast cancer with meastectomy.  Shawn Stall made aware.  NO new orders given.  ?

## 2021-04-22 ENCOUNTER — Other Ambulatory Visit: Payer: Self-pay

## 2021-04-22 ENCOUNTER — Encounter (HOSPITAL_COMMUNITY)
Admission: RE | Admit: 2021-04-22 | Discharge: 2021-04-22 | Disposition: A | Discharge status: Home / Self Care | Payer: Medicare Other | Source: Ambulatory Visit | Attending: Orthopedic Surgery | Admitting: Orthopedic Surgery

## 2021-04-22 ENCOUNTER — Encounter (HOSPITAL_COMMUNITY): Payer: Self-pay

## 2021-04-22 VITALS — BP 140/70 | HR 68 | Temp 98.2°F | Resp 16 | Ht 68.0 in | Wt 144.0 lb

## 2021-04-22 DIAGNOSIS — Z01818 Encounter for other preprocedural examination: Secondary | ICD-10-CM | POA: Insufficient documentation

## 2021-04-22 DIAGNOSIS — M1711 Unilateral primary osteoarthritis, right knee: Secondary | ICD-10-CM | POA: Diagnosis not present

## 2021-04-22 HISTORY — DX: Pneumonia, unspecified organism: J18.9

## 2021-04-22 HISTORY — DX: Encounter for fitting and adjustment of other specified devices: Z46.89

## 2021-04-22 HISTORY — DX: Unspecified osteoarthritis, unspecified site: M19.90

## 2021-04-22 LAB — COMPREHENSIVE METABOLIC PANEL
ALT: 23 U/L (ref 0–44)
AST: 25 U/L (ref 15–41)
Albumin: 4 g/dL (ref 3.5–5.0)
Alkaline Phosphatase: 78 U/L (ref 38–126)
Anion gap: 7 (ref 5–15)
BUN: 28 mg/dL — ABNORMAL HIGH (ref 8–23)
CO2: 25 mmol/L (ref 22–32)
Calcium: 9.6 mg/dL (ref 8.9–10.3)
Chloride: 106 mmol/L (ref 98–111)
Creatinine, Ser: 1.16 mg/dL — ABNORMAL HIGH (ref 0.44–1.00)
GFR, Estimated: 48 mL/min — ABNORMAL LOW (ref >60)
Glucose, Bld: 85 mg/dL (ref 70–99)
Potassium: 3.9 mmol/L (ref 3.5–5.1)
Sodium: 138 mmol/L (ref 135–145)
Total Bilirubin: 0.5 mg/dL (ref 0.3–1.2)
Total Protein: 6.8 g/dL (ref 6.5–8.1)

## 2021-04-22 LAB — CBC
HCT: 40.9 % (ref 36.0–46.0)
Hemoglobin: 13.7 g/dL (ref 12.0–15.0)
MCH: 32.3 pg (ref 26.0–34.0)
MCHC: 33.5 g/dL (ref 30.0–36.0)
MCV: 96.5 fL (ref 80.0–100.0)
Platelets: 223 10*3/uL (ref 150–400)
RBC: 4.24 MIL/uL (ref 3.87–5.11)
RDW: 13.5 % (ref 11.5–15.5)
WBC: 4.5 10*3/uL (ref 4.0–10.5)
nRBC: 0 % (ref 0.0–0.2)

## 2021-04-22 LAB — TYPE AND SCREEN
ABO/RH(D): O POS
Antibody Screen: NEGATIVE

## 2021-04-22 LAB — SURGICAL PCR SCREEN
MRSA, PCR: NEGATIVE
Staphylococcus aureus: NEGATIVE

## 2021-04-23 ENCOUNTER — Other Ambulatory Visit (HOSPITAL_COMMUNITY): Payer: Medicare Other

## 2021-04-26 ENCOUNTER — Telehealth: Payer: Self-pay

## 2021-04-26 NOTE — Telephone Encounter (Signed)
Return call, spoke with pt, pt states she is having knee surgery in 1 week and wants to make sure that this was approved by Dr Lindi Adie, it has been.  Pt states she is having sharp intermittent pains in her right side, previously told it was from radiation scarring and a prior pneumonia infection.  Advised pt to follow up with her PCP if this continues or seek more urgent medical care should symptoms change.  Pt states she saw PCP already.  I again advised pt to seek more urgent medical care should symptoms change.  Pt verbalized understanding and thanks.  ?

## 2021-04-29 NOTE — H&P (Signed)
TOTAL KNEE ADMISSION H&P ? ?Patient is being admitted for right total knee arthroplasty. ? ?Subjective: ? ?Chief Complaint:right knee pain. ? ?HPI: Shawna [name], 81 y.o. female, has a history of pain and functional disability in the right knee due to arthritis and has failed non-surgical conservative treatments for greater than 12 weeks to includeNSAID's and/or analgesics, corticosteriod injections, and activity modification.  Onset of symptoms was gradual, starting 2 years ago with gradually worsening course since that time. The patient noted no past surgery on the right knee(s).  Patient currently rates pain in the right knee(s) at 8 out of 10 with activity. Patient has worsening of pain with activity and weight bearing, pain that interferes with activities of daily living, and pain with passive range of motion.  Patient has evidence of joint space narrowing by imaging studies. T There is no active infection. ? ?Patient Active Problem List  ? Diagnosis Date Noted  ? Lymphedema 11/25/2020  ? Dyspnea 11/25/2020  ? Pain due to onychomycosis of toenails of both feet 06/17/2020  ? Pain in right knee 04/22/2019  ? Trochanteric bursitis of right hip 07/06/2017  ? 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, right ([place]) 02/24/2017  ? Anemia 11/03/2015  ? Edema 07/15/2015  ? Monoallelic mutation of NBN gene   ? Genetic testing 01/13/2015  ? Family history of breast cancer   ? Family history of colon cancer   ? Breast cancer of lower-inner quadrant of right female breast ([place]) 12/30/2014  ? Basal cell carcinoma of skin 10/30/2014  ? Abnormal results of liver function studies 10/30/2014  ? Callosity 10/30/2014  ? Age-related osteoporosis without current pathological fracture 07/31/2008  ? Essential hypertension 07/31/2008  ? ?Past Medical History:  ?Diagnosis Date  ? Arthritis   ? Breast cancer ([place]) 11/2014  ? Family history of breast cancer   ? Family history of colon cancer   ? History of kidney stones   ? History of  radiation therapy 08/11/15- 09/23/2015  ? Right Breast  ? History of radiation therapy 11/13/17- 12/29/17  ? Right chest wall and IM nodes 50.04 Gy in 28 fractions, Right supraclavicular and PAB nodes 50.04 Gy in 28 fractions, Right Chest wall boost/ 10 Gy in 5 fractions.   ? Hyperlipidemia   ? Hypertension   ? Monoallelic mutation of NBN gene   ? Osteoporosis   ? Pessary maintenance   ? Pneumonia   ? Skin cancer of nose   ?  ?Past Surgical History:  ?Procedure Laterality Date  ? APPENDECTOMY    ? bilateral cataract surgery     ? BREAST LUMPECTOMY WITH RADIOACTIVE SEED AND SENTINEL LYMPH NODE BIOPSY Right 02/03/2015  ? Procedure: RIGHT BREAST LUMPECTOMY WITH RADIOACTIVE SEED AND RIGHT SENTINEL LYMPH NODE BIOPSY;  Surgeon: [name], MD;  Location: [facility];  Service: General;  Laterality: Right;  ? BREAST SURGERY  11/2012  ? biopsy, benign breast tissue  ? COLONOSCOPY  06/2010  ? rec  ? LITHOTRIPSY    ? kidney stone on left side  ? MASTECTOMY W/ SENTINEL NODE BIOPSY Right 03/07/2017  ? Procedure: RIGHT TOTAL MASTECTOMY WITH RIGHT AXILLARY SENTINEL LYMPH NODE BIOPSY;  Surgeon: [name], MD;  Location: [facility];  Service: General;  Laterality: Right;  ? OVARIAN CYST REMOVAL  age 28  ? POLYPECTOMY  04/2005  ? colon  ? PORTACATH PLACEMENT Left 02/03/2015  ? Procedure: INSERTION PORT-A-CATH WITH ULTRA SOUND GUIDANCE;  Surgeon: [name], MD;  Location: [facility];  Service: General;  Laterality: Left;  ? portacath removal     ? reexcision  10/09/2017  ? Dr. Lucia Gaskins, chest wall excision for recurrent breast cancer.   ?  ?No current facility-administered medications for this encounter.  ? ?Current Outpatient Medications  ?Medication Sig Dispense Refill Last Dose  ? acetaminophen (TYLENOL) 650 MG CR tablet Take 650-1,300 mg by mouth every 8 (eight) hours as needed for pain.     ? Biotin 1000 MCG tablet Take 1,000 mcg by mouth daily.     ? Calcium Carbonate-Vit D-Min (CALTRATE 600+D PLUS  PO) Take 1 tablet by mouth in the morning.     ? cholecalciferol (VITAMIN D) 1000 units tablet Take 1,000 Units by mouth daily.     ? CRESTOR 20 MG tablet Take 20 mg by mouth in the morning.  0   ? estradiol (ESTRACE) 0.1 MG/GM vaginal cream USE 1 GRAM VAGINALLY 2 TIMES A WEEK at bedtime (Patient taking differently: Place 1 Applicatorful vaginally every Sunday.) 42.5 g 1   ? hydrochlorothiazide (HYDRODIURIL) 12.5 MG tablet Take 12.5 mg by mouth in the morning.     ? losartan (COZAAR) 100 MG tablet Take 100 mg by mouth in the morning.  0   ? Polyethyl Glyc-Propyl Glyc PF (SYSTANE PRESERVATIVE FREE) 0.4-0.3 % SOLN Place 1-2 drops into both eyes 3 (three) times daily as needed (dry/irritated eyes).     ? mirabegron ER (MYRBETRIQ) 25 MG TB24 tablet Take 1 tablet (25 mg total) by mouth daily. One po qd (Patient not taking: Reported on 04/20/2021) 30 tablet 1 Not Taking  ? ?No Known Allergies  ?Social History  ? ?Tobacco Use  ? Smoking status: Never  ? Smokeless tobacco: Never  ?Substance Use Topics  ? Alcohol use: Yes  ?  Comment: rarely  ?  ?Family History  ?Problem Relation Age of Onset  ? Colon cancer Sister 56  ?     died at 54  ? Breast cancer Mother 66  ? Hypertension Mother   ? Osteoporosis Mother   ? Heart disease Father   ?     CABG  ? Dementia Father   ? Throat cancer Brother 43  ? Breast cancer Sister 70  ? Lymphoma Sister 67  ? Melanoma Sister   ? Skin cancer Brother   ? Skin cancer Daughter   ?  ? ?Review of Systems  ?Constitutional:  Negative for chills and fever.  ?Respiratory:  Negative for cough and shortness of breath.   ?Cardiovascular:  Negative for chest pain.  ?Gastrointestinal:  Negative for nausea and vomiting.  ?Musculoskeletal:  Positive for arthralgias.  ? ? ?Objective: ? ?Physical Exam ?Well nourished and well developed. ?General: Alert and oriented x3, cooperative and pleasant, no acute distress. ?Head: normocephalic, atraumatic, neck supple. ?Eyes: EOMI. ? ?Musculoskeletal: ?Right knee  exam: ?No significant effusion warmth or erythema ?Valgus attitude of the right knee with slight flexion contracture with flexion to 120 degrees ?Stable medial lateral collateral ligaments with passively correctable valgus ?Neurovascular intact distally without lower extremity edema or erythema ?Normal ipsilateral right hip range of motion without groin pain or referred pain ? ?Calves soft and nontender. Motor function intact in LE. Strength 5/5 LE bilaterally. ?Neuro: Distal pulses 2+. Sensation to light touch intact in LE. ? ?Vital signs in last 24 hours: ?  ? ?Labs: ? ? ?Estimated body mass index is 21.9 kg/m? as calculated from the following: ?  Height as of 04/22/21: '5\' 8"'$  (1.727 m). ?  Weight as of 04/22/21: 65.3 kg. ? ? ?Imaging Review ?Plain radiographs demonstrate severe degenerative joint disease of the right knee(s). The overall alignment isneutral. The bone quality appears to be adequate for age and reported activity level. ? ? ?Assessment/Plan: ? ?End stage arthritis, right knee  ? ?The patient history, physical examination, clinical judgment of the provider and imaging studies are consistent with end stage degenerative joint disease of the right knee(s) and total knee arthroplasty is deemed medically necessary. The treatment options including medical management, injection therapy arthroscopy and arthroplasty were discussed at length. The risks and benefits of total knee arthroplasty were presented and reviewed. The risks due to aseptic loosening, infection, stiffness, patella tracking problems, thromboembolic complications and other imponderables were discussed. The patient acknowledged the explanation, agreed to proceed with the plan and consent was signed. Patient is being admitted for inpatient treatment for surgery, pain control, PT, OT, prophylactic antibiotics, VTE prophylaxis, progressive ambulation and ADL's and discharge planning. The patient is planning to be discharged  home. ? ?Therapy Plans:  outpatient [facility] at [facility] ?Disposition: Home with son & daughter staying with her ?Planned DVT Prophylaxis: aspirin '81mg'$  BID ?DME needed: none ?PCP: Dr. Virgina Jock, clearance received ?Heme/Onc: Dr. Lindi Adie (hx of breast CA)

## 2021-05-04 ENCOUNTER — Other Ambulatory Visit: Payer: Self-pay

## 2021-05-04 ENCOUNTER — Observation Stay (HOSPITAL_COMMUNITY)
Admission: RE | Admit: 2021-05-04 | Discharge: 2021-05-05 | Disposition: A | Discharge status: Home / Self Care | Payer: Medicare Other | Source: Ambulatory Visit | Attending: Orthopedic Surgery | Admitting: Orthopedic Surgery

## 2021-05-04 ENCOUNTER — Ambulatory Visit (HOSPITAL_BASED_OUTPATIENT_CLINIC_OR_DEPARTMENT_OTHER): Payer: Medicare Other | Admitting: Anesthesiology

## 2021-05-04 ENCOUNTER — Encounter (HOSPITAL_COMMUNITY): Payer: Self-pay | Admitting: Orthopedic Surgery

## 2021-05-04 ENCOUNTER — Encounter (HOSPITAL_COMMUNITY)
Admission: RE | Disposition: A | Discharge status: Home / Self Care | Payer: Self-pay | Source: Ambulatory Visit | Attending: Orthopedic Surgery

## 2021-05-04 ENCOUNTER — Ambulatory Visit (HOSPITAL_COMMUNITY): Payer: Medicare Other | Admitting: Emergency Medicine

## 2021-05-04 DIAGNOSIS — I10 Essential (primary) hypertension: Secondary | ICD-10-CM | POA: Diagnosis not present

## 2021-05-04 DIAGNOSIS — Z853 Personal history of malignant neoplasm of breast: Secondary | ICD-10-CM | POA: Insufficient documentation

## 2021-05-04 DIAGNOSIS — Z96651 Presence of right artificial knee joint: Secondary | ICD-10-CM

## 2021-05-04 DIAGNOSIS — Z79899 Other long term (current) drug therapy: Secondary | ICD-10-CM | POA: Insufficient documentation

## 2021-05-04 DIAGNOSIS — M1711 Unilateral primary osteoarthritis, right knee: Principal | ICD-10-CM | POA: Insufficient documentation

## 2021-05-04 DIAGNOSIS — Z85828 Personal history of other malignant neoplasm of skin: Secondary | ICD-10-CM | POA: Insufficient documentation

## 2021-05-04 DIAGNOSIS — G8918 Other acute postprocedural pain: Secondary | ICD-10-CM | POA: Diagnosis not present

## 2021-05-04 HISTORY — PX: TOTAL KNEE ARTHROPLASTY: SHX125

## 2021-05-04 LAB — ABO/RH: ABO/RH(D): O POS

## 2021-05-04 SURGERY — ARTHROPLASTY, KNEE, TOTAL
Anesthesia: Regional | Site: Knee | Laterality: Right

## 2021-05-04 MED ORDER — FENTANYL CITRATE PF 50 MCG/ML IJ SOSY
PREFILLED_SYRINGE | INTRAMUSCULAR | Status: AC
  Filled 2021-05-04: qty 3

## 2021-05-04 MED ORDER — PHENYLEPHRINE HCL-NACL 20-0.9 MG/250ML-% IV SOLN
INTRAVENOUS | Status: DC | PRN
Start: 2021-05-04 — End: 2021-05-04
  Administered 2021-05-04: 25 ug/min via INTRAVENOUS

## 2021-05-04 MED ORDER — PHENYLEPHRINE HCL (PRESSORS) 10 MG/ML IV SOLN
INTRAVENOUS | Status: AC
  Filled 2021-05-04: qty 1

## 2021-05-04 MED ORDER — OXYCODONE HCL 5 MG PO TABS: 10.0000 mg | ORAL_TABLET | ORAL | Status: DC | PRN

## 2021-05-04 MED ORDER — FENTANYL CITRATE (PF) 100 MCG/2ML IJ SOLN
INTRAMUSCULAR | Status: DC | PRN
  Administered 2021-05-04 (×2): 50 ug via INTRAVENOUS

## 2021-05-04 MED ORDER — FERROUS SULFATE 325 (65 FE) MG PO TABS
325.0000 mg | ORAL_TABLET | Freq: Three times a day (TID) | ORAL | Status: DC
  Administered 2021-05-04 – 2021-05-05 (×3): 325 mg via ORAL
  Filled 2021-05-04 (×3): qty 1

## 2021-05-04 MED ORDER — CEFAZOLIN SODIUM-DEXTROSE 2-4 GM/100ML-% IV SOLN
2.0000 g | Freq: Four times a day (QID) | INTRAVENOUS | Status: AC
  Administered 2021-05-04 (×2): 2 g via INTRAVENOUS
  Filled 2021-05-04 (×2): qty 100

## 2021-05-04 MED ORDER — ROSUVASTATIN CALCIUM 20 MG PO TABS
20.0000 mg | ORAL_TABLET | Freq: Every day | ORAL | Status: DC
  Administered 2021-05-05: 20 mg via ORAL
  Filled 2021-05-04: qty 1

## 2021-05-04 MED ORDER — OXYCODONE HCL 5 MG PO TABS
ORAL_TABLET | ORAL | Status: AC
  Filled 2021-05-04: qty 1

## 2021-05-04 MED ORDER — ORAL CARE MOUTH RINSE: 15.0000 mL | Freq: Once | OROMUCOSAL | Status: AC

## 2021-05-04 MED ORDER — SODIUM CHLORIDE 0.9 % IV SOLN: INTRAVENOUS | Status: DC

## 2021-05-04 MED ORDER — LOSARTAN POTASSIUM 50 MG PO TABS
100.0000 mg | ORAL_TABLET | Freq: Every day | ORAL | Status: DC
  Administered 2021-05-05: 100 mg via ORAL
  Filled 2021-05-04: qty 2

## 2021-05-04 MED ORDER — BUPIVACAINE-EPINEPHRINE (PF) 0.5% -1:200000 IJ SOLN
INTRAMUSCULAR | Status: DC | PRN
  Administered 2021-05-04: 30 mL via PERINEURAL

## 2021-05-04 MED ORDER — METHOCARBAMOL 500 MG IVPB - SIMPLE MED
500.0000 mg | Freq: Four times a day (QID) | INTRAVENOUS | Status: DC | PRN
  Administered 2021-05-04: 500 mg via INTRAVENOUS
  Filled 2021-05-04: qty 50

## 2021-05-04 MED ORDER — DOCUSATE SODIUM 100 MG PO CAPS
100.0000 mg | ORAL_CAPSULE | Freq: Two times a day (BID) | ORAL | Status: DC
  Administered 2021-05-04 – 2021-05-05 (×2): 100 mg via ORAL
  Filled 2021-05-04 (×2): qty 1

## 2021-05-04 MED ORDER — KETOROLAC TROMETHAMINE 30 MG/ML IJ SOLN
INTRAMUSCULAR | Status: AC
  Filled 2021-05-04: qty 1

## 2021-05-04 MED ORDER — ONDANSETRON HCL 4 MG/2ML IJ SOLN: 4.0000 mg | Freq: Once | INTRAMUSCULAR | Status: DC | PRN

## 2021-05-04 MED ORDER — ACETAMINOPHEN 325 MG PO TABS: 325.0000 mg | ORAL_TABLET | Freq: Four times a day (QID) | ORAL | Status: DC | PRN

## 2021-05-04 MED ORDER — TRANEXAMIC ACID-NACL 1000-0.7 MG/100ML-% IV SOLN
1000.0000 mg | Freq: Once | INTRAVENOUS | Status: AC
  Administered 2021-05-04: 1000 mg via INTRAVENOUS
  Filled 2021-05-04: qty 100

## 2021-05-04 MED ORDER — SODIUM CHLORIDE (PF) 0.9 % IJ SOLN
INTRAMUSCULAR | Status: AC
  Filled 2021-05-04: qty 30

## 2021-05-04 MED ORDER — PROPOFOL 500 MG/50ML IV EMUL
INTRAVENOUS | Status: DC | PRN
  Administered 2021-05-04: 75 ug/kg/min via INTRAVENOUS

## 2021-05-04 MED ORDER — CEFAZOLIN SODIUM-DEXTROSE 2-4 GM/100ML-% IV SOLN
2.0000 g | INTRAVENOUS | Status: AC
  Administered 2021-05-04: 2 g via INTRAVENOUS
  Filled 2021-05-04: qty 100

## 2021-05-04 MED ORDER — OXYCODONE HCL 5 MG PO TABS
5.0000 mg | ORAL_TABLET | ORAL | Status: DC | PRN
  Administered 2021-05-04 – 2021-05-05 (×3): 10 mg via ORAL
  Filled 2021-05-04 (×4): qty 2

## 2021-05-04 MED ORDER — HYDROCHLOROTHIAZIDE 12.5 MG PO TABS
12.5000 mg | ORAL_TABLET | Freq: Every day | ORAL | Status: DC
  Administered 2021-05-05: 12.5 mg via ORAL
  Filled 2021-05-04: qty 1

## 2021-05-04 MED ORDER — HYDROMORPHONE HCL 1 MG/ML IJ SOLN
INTRAMUSCULAR | Status: AC
  Filled 2021-05-04: qty 1

## 2021-05-04 MED ORDER — BISACODYL 10 MG RE SUPP: 10.0000 mg | Freq: Every day | RECTAL | Status: DC | PRN

## 2021-05-04 MED ORDER — HYDROMORPHONE HCL 1 MG/ML IJ SOLN: 0.5000 mg | INTRAMUSCULAR | Status: DC | PRN

## 2021-05-04 MED ORDER — ACETAMINOPHEN 10 MG/ML IV SOLN
INTRAVENOUS | Status: AC
  Filled 2021-05-04: qty 100

## 2021-05-04 MED ORDER — SODIUM CHLORIDE 0.9 % IR SOLN
Status: DC | PRN
  Administered 2021-05-04: 1000 mL

## 2021-05-04 MED ORDER — PHENOL 1.4 % MT LIQD: 1.0000 | OROMUCOSAL | Status: DC | PRN

## 2021-05-04 MED ORDER — TRANEXAMIC ACID-NACL 1000-0.7 MG/100ML-% IV SOLN
1000.0000 mg | INTRAVENOUS | Status: AC
  Administered 2021-05-04: 1000 mg via INTRAVENOUS
  Filled 2021-05-04: qty 100

## 2021-05-04 MED ORDER — LIDOCAINE 2% (20 MG/ML) 5 ML SYRINGE
INTRAMUSCULAR | Status: DC | PRN
  Administered 2021-05-04: 50 mg via INTRAVENOUS

## 2021-05-04 MED ORDER — ONDANSETRON HCL 4 MG PO TABS: 4.0000 mg | ORAL_TABLET | Freq: Four times a day (QID) | ORAL | Status: DC | PRN

## 2021-05-04 MED ORDER — BUPIVACAINE IN DEXTROSE 0.75-8.25 % IT SOLN
INTRATHECAL | Status: DC | PRN
  Administered 2021-05-04: 1.6 mL via INTRATHECAL

## 2021-05-04 MED ORDER — LACTATED RINGERS IV SOLN: INTRAVENOUS | Status: DC

## 2021-05-04 MED ORDER — ONDANSETRON HCL 4 MG/2ML IJ SOLN
INTRAMUSCULAR | Status: AC
  Filled 2021-05-04: qty 2

## 2021-05-04 MED ORDER — METOCLOPRAMIDE HCL 5 MG PO TABS: 5.0000 mg | ORAL_TABLET | Freq: Three times a day (TID) | ORAL | Status: DC | PRN

## 2021-05-04 MED ORDER — DEXAMETHASONE SODIUM PHOSPHATE 10 MG/ML IJ SOLN
10.0000 mg | Freq: Once | INTRAMUSCULAR | Status: AC
  Administered 2021-05-05: 10 mg via INTRAVENOUS
  Filled 2021-05-04: qty 1

## 2021-05-04 MED ORDER — MIDAZOLAM HCL 2 MG/2ML IJ SOLN
INTRAMUSCULAR | Status: AC
  Filled 2021-05-04: qty 2

## 2021-05-04 MED ORDER — POLYETHYLENE GLYCOL 3350 17 G PO PACK: 17.0000 g | PACK | Freq: Every day | ORAL | Status: DC | PRN

## 2021-05-04 MED ORDER — SODIUM CHLORIDE (PF) 0.9 % IJ SOLN
INTRAMUSCULAR | Status: DC | PRN
  Administered 2021-05-04: 30 mL

## 2021-05-04 MED ORDER — FENTANYL CITRATE (PF) 100 MCG/2ML IJ SOLN
INTRAMUSCULAR | Status: AC
  Filled 2021-05-04: qty 2

## 2021-05-04 MED ORDER — METOCLOPRAMIDE HCL 5 MG/ML IJ SOLN: 5.0000 mg | Freq: Three times a day (TID) | INTRAMUSCULAR | Status: DC | PRN

## 2021-05-04 MED ORDER — AMISULPRIDE (ANTIEMETIC) 5 MG/2ML IV SOLN: 10.0000 mg | Freq: Once | INTRAVENOUS | Status: DC | PRN

## 2021-05-04 MED ORDER — BUPIVACAINE-EPINEPHRINE (PF) 0.25% -1:200000 IJ SOLN
INTRAMUSCULAR | Status: DC | PRN
Start: 2021-05-04 — End: 2021-05-04
  Administered 2021-05-04: 30 mL

## 2021-05-04 MED ORDER — ONDANSETRON HCL 4 MG/2ML IJ SOLN
INTRAMUSCULAR | Status: DC | PRN
  Administered 2021-05-04: 4 mg via INTRAVENOUS

## 2021-05-04 MED ORDER — DEXAMETHASONE SODIUM PHOSPHATE 10 MG/ML IJ SOLN
INTRAMUSCULAR | Status: AC
  Filled 2021-05-04: qty 1

## 2021-05-04 MED ORDER — METHOCARBAMOL 500 MG PO TABS: 500.0000 mg | ORAL_TABLET | Freq: Four times a day (QID) | ORAL | Status: DC | PRN

## 2021-05-04 MED ORDER — POVIDONE-IODINE 10 % EX SWAB
2.0000 "application " | Freq: Once | CUTANEOUS | Status: AC
  Administered 2021-05-04: 2 via TOPICAL

## 2021-05-04 MED ORDER — DEXAMETHASONE SODIUM PHOSPHATE 10 MG/ML IJ SOLN
INTRAMUSCULAR | Status: DC | PRN
  Administered 2021-05-04: 10 mg via INTRAVENOUS

## 2021-05-04 MED ORDER — ONDANSETRON HCL 4 MG/2ML IJ SOLN
4.0000 mg | Freq: Four times a day (QID) | INTRAMUSCULAR | Status: DC | PRN
  Administered 2021-05-05: 4 mg via INTRAVENOUS
  Filled 2021-05-04: qty 2

## 2021-05-04 MED ORDER — CHLORHEXIDINE GLUCONATE 0.12 % MT SOLN
15.0000 mL | Freq: Once | OROMUCOSAL | Status: AC
  Administered 2021-05-04: 15 mL via OROMUCOSAL

## 2021-05-04 MED ORDER — SODIUM CHLORIDE 0.9 % IR SOLN
Status: DC | PRN
Start: 2021-05-04 — End: 2021-05-04
  Administered 2021-05-04: 1000 mL

## 2021-05-04 MED ORDER — BUPIVACAINE-EPINEPHRINE (PF) 0.25% -1:200000 IJ SOLN
INTRAMUSCULAR | Status: AC
  Filled 2021-05-04: qty 30

## 2021-05-04 MED ORDER — METHOCARBAMOL 500 MG IVPB - SIMPLE MED
INTRAVENOUS | Status: AC
  Filled 2021-05-04: qty 50

## 2021-05-04 MED ORDER — HYDROMORPHONE HCL 1 MG/ML IJ SOLN
0.2500 mg | INTRAMUSCULAR | Status: DC | PRN
  Administered 2021-05-04 (×4): 0.5 mg via INTRAVENOUS

## 2021-05-04 MED ORDER — FENTANYL CITRATE PF 50 MCG/ML IJ SOSY
25.0000 ug | PREFILLED_SYRINGE | INTRAMUSCULAR | Status: DC | PRN
  Administered 2021-05-04 (×3): 50 ug via INTRAVENOUS

## 2021-05-04 MED ORDER — OXYCODONE HCL 5 MG PO TABS
5.0000 mg | ORAL_TABLET | Freq: Once | ORAL | Status: AC
  Administered 2021-05-04: 5 mg via ORAL

## 2021-05-04 MED ORDER — DIPHENHYDRAMINE HCL 12.5 MG/5ML PO ELIX: 12.5000 mg | ORAL_SOLUTION | ORAL | Status: DC | PRN

## 2021-05-04 MED ORDER — ASPIRIN 81 MG PO CHEW
81.0000 mg | CHEWABLE_TABLET | Freq: Two times a day (BID) | ORAL | Status: DC
  Administered 2021-05-04 – 2021-05-05 (×2): 81 mg via ORAL
  Filled 2021-05-04 (×2): qty 1

## 2021-05-04 MED ORDER — MENTHOL 3 MG MT LOZG: 1.0000 | LOZENGE | OROMUCOSAL | Status: DC | PRN

## 2021-05-04 MED ORDER — ACETAMINOPHEN 10 MG/ML IV SOLN
1000.0000 mg | Freq: Once | INTRAVENOUS | Status: DC | PRN
  Administered 2021-05-04: 1000 mg via INTRAVENOUS

## 2021-05-04 MED ORDER — MIDAZOLAM HCL 2 MG/2ML IJ SOLN
INTRAMUSCULAR | Status: DC | PRN
  Administered 2021-05-04: 1 mg via INTRAVENOUS

## 2021-05-04 MED ORDER — DEXAMETHASONE SODIUM PHOSPHATE 10 MG/ML IJ SOLN: 8.0000 mg | Freq: Once | INTRAMUSCULAR | Status: DC

## 2021-05-04 MED ORDER — PROPOFOL 1000 MG/100ML IV EMUL
INTRAVENOUS | Status: AC
  Filled 2021-05-04: qty 200

## 2021-05-04 MED ORDER — KETOROLAC TROMETHAMINE 30 MG/ML IJ SOLN
INTRAMUSCULAR | Status: DC | PRN
  Administered 2021-05-04: 30 mg

## 2021-05-04 SURGICAL SUPPLY — 60 items
ADH SKN CLS APL DERMABOND .7 (GAUZE/BANDAGES/DRESSINGS) ×1
ATTUNE MED ANAT PAT 35 KNEE (Knees) ×1 IMPLANT
ATTUNE PSFEM RTSZ5 NARCEM KNEE (Femur) ×1 IMPLANT
ATTUNE PSRP INSR SZ5 8 KNEE (Insert) ×1 IMPLANT
BAG COUNTER SPONGE SURGICOUNT (BAG) ×1 IMPLANT
BAG SPEC THK2 15X12 ZIP CLS (MISCELLANEOUS) ×1
BAG SPNG CNTER NS LX DISP (BAG) ×1
BAG ZIPLOCK 12X15 (MISCELLANEOUS) ×1 IMPLANT
BASEPLATE TIBIAL ROTATING SZ 4 (Knees) ×1 IMPLANT
BLADE SAW SGTL 11.0X1.19X90.0M (BLADE) IMPLANT
BLADE SAW SGTL 13.0X1.19X90.0M (BLADE) ×2 IMPLANT
BLADE SURG SZ10 CARB STEEL (BLADE) ×4 IMPLANT
BNDG ELASTIC 6X5.8 VLCR STR LF (GAUZE/BANDAGES/DRESSINGS) ×2 IMPLANT
BOWL SMART MIX CTS (DISPOSABLE) ×2 IMPLANT
BSPLAT TIB 4 CMNT ROT PLAT STR (Knees) ×1 IMPLANT
CEMENT HV SMART SET (Cement) ×2 IMPLANT
CUFF TOURN SGL QUICK 34 (TOURNIQUET CUFF) ×2
CUFF TRNQT CYL 34X4.125X (TOURNIQUET CUFF) ×1 IMPLANT
DERMABOND ADVANCED (GAUZE/BANDAGES/DRESSINGS) ×1
DERMABOND ADVANCED .7 DNX12 (GAUZE/BANDAGES/DRESSINGS) ×1 IMPLANT
DRAPE INCISE IOBAN 66X45 STRL (DRAPES) ×2 IMPLANT
DRAPE U-SHAPE 47X51 STRL (DRAPES) ×2 IMPLANT
DRESSING AQUACEL AG SP 3.5X10 (GAUZE/BANDAGES/DRESSINGS) ×1 IMPLANT
DRSG AQUACEL AG SP 3.5X10 (GAUZE/BANDAGES/DRESSINGS) ×2
DURAPREP 26ML APPLICATOR (WOUND CARE) ×4 IMPLANT
ELECT REM PT RETURN 15FT ADLT (MISCELLANEOUS) ×2 IMPLANT
GLOVE BIO SURGEON STRL SZ 6 (GLOVE) ×2 IMPLANT
GLOVE BIO SURGEON STRL SZ7 (GLOVE) ×2 IMPLANT
GLOVE BIOGEL PI IND STRL 7.5 (GLOVE) ×1 IMPLANT
GLOVE BIOGEL PI IND STRL 8.5 (GLOVE) IMPLANT
GLOVE BIOGEL PI INDICATOR 7.5 (GLOVE) ×1
GLOVE BIOGEL PI INDICATOR 8.5 (GLOVE)
GLOVE INDICATOR 6.5 STRL GRN (GLOVE) ×2 IMPLANT
GLOVE SKINSENSE NS SZ8.0 LF (GLOVE) ×1
GLOVE SKINSENSE STRL SZ8.0 LF (GLOVE) ×1 IMPLANT
GOWN STRL REUS W/ TWL LRG LVL3 (GOWN DISPOSABLE) ×1 IMPLANT
GOWN STRL REUS W/TWL LRG LVL3 (GOWN DISPOSABLE) ×2
HANDPIECE INTERPULSE COAX TIP (DISPOSABLE) ×2
HOLDER FOLEY CATH W/STRAP (MISCELLANEOUS) IMPLANT
KIT TURNOVER KIT A (KITS) IMPLANT
MANIFOLD NEPTUNE II (INSTRUMENTS) ×2 IMPLANT
NDL SAFETY ECLIPSE 18X1.5 (NEEDLE) IMPLANT
NEEDLE HYPO 18GX1.5 SHARP (NEEDLE) ×2
NS IRRIG 1000ML POUR BTL (IV SOLUTION) ×2 IMPLANT
PACK TOTAL KNEE CUSTOM (KITS) ×2 IMPLANT
PROTECTOR NERVE ULNAR (MISCELLANEOUS) ×2 IMPLANT
SET HNDPC FAN SPRY TIP SCT (DISPOSABLE) ×1 IMPLANT
SET PAD KNEE POSITIONER (MISCELLANEOUS) ×2 IMPLANT
SPIKE FLUID TRANSFER (MISCELLANEOUS) ×2 IMPLANT
SUT MNCRL AB 4-0 PS2 18 (SUTURE) ×2 IMPLANT
SUT STRATAFIX PDS+ 0 24IN (SUTURE) ×2 IMPLANT
SUT VIC AB 1 CT1 36 (SUTURE) ×2 IMPLANT
SUT VIC AB 2-0 CT1 27 (SUTURE) ×6
SUT VIC AB 2-0 CT1 TAPERPNT 27 (SUTURE) ×2 IMPLANT
SYR 3ML LL SCALE MARK (SYRINGE) ×2 IMPLANT
TRAY CATH INTERMITTENT SS 16FR (CATHETERS) ×1 IMPLANT
TRAY FOLEY MTR SLVR 16FR STAT (SET/KITS/TRAYS/PACK) ×1 IMPLANT
TUBE SUCTION HIGH CAP CLEAR NV (SUCTIONS) ×2 IMPLANT
WATER STERILE IRR 1000ML POUR (IV SOLUTION) ×4 IMPLANT
WRAP KNEE MAXI GEL POST OP (GAUZE/BANDAGES/DRESSINGS) ×2 IMPLANT

## 2021-05-04 NOTE — Op Note (Signed)
NAME:  Shawna [name]                  ?   ? MEDICAL RECORD NO.:  [id number]  ?   ?                        FACILITY:  [facility]  ?   ? PHYSICIAN:  Pietro Cassis. Alvan Dame, M.D.  DATE OF BIRTH:  1940-01-26  ?   ? DATE OF PROCEDURE:  05/04/2021   ?  ?   ?                             OPERATIVE REPORT  ?   ?   ? PREOPERATIVE DIAGNOSIS:  Right knee osteoarthritis.  ?   ? POSTOPERATIVE DIAGNOSIS:  Right knee osteoarthritis.  ?   ? FINDINGS:  The patient was noted to have complete loss of cartilage and  ? bone-on-bone arthritis with associated osteophytes in the lateral and patellofemoral compartments of  ? the knee with a significant synovitis and associated effusion.  The patient had failed months of conservative treatment including medications, injection therapy, activity modification. ?   ? PROCEDURE:  Right total knee replacement.  ?   ? COMPONENTS USED:  DePuy Attune rotating platform posterior stabilized knee  ? system, a size 5N femur, 4 tibia, size 8 mm PS AOX insert, and 35 anatomic patellar  ? button.  ?   ? SURGEON:  Pietro Cassis. Alvan Dame, M.D.  ?   ? ASSISTANT:  Costella Hatcher, PA-C.  ?   ? ANESTHESIA:  Regional and Spinal.  ?   ? SPECIMENS:  None.  ?   ? COMPLICATION:  None.  ?   ? DRAINS:  None. ? ?EBL: <100cc  ?   ? TOURNIQUET TIME:   ?Total Tourniquet Time Documented: ?Thigh (Right) - 30 minutes ?Total: Thigh (Right) - 30 minutes ? .  ?   ? The patient was stable to the recovery room.  ?   ? INDICATION FOR PROCEDURE:  Shawna [name] is a 81 y.o. female patient of  ? mine.  The patient had been seen, evaluated, and treated for months conservatively in the  ? office with medication, activity modification, and injections.  The patient had  ? radiographic changes of bone-on-bone arthritis with endplate sclerosis and osteophytes noted.  Based on the radiographic changes and failed conservative measures, the patient  ? decided to proceed with definitive treatment, total knee replacement.  Risks of infection, DVT, component  failure, need for revision surgery, neurovascular injury were reviewed in the office setting.  The postop course was reviewed stressing the efforts to maximize post-operative satisfaction and function.  Consent was obtained for benefit of pain  ? relief.  ?   ? PROCEDURE IN DETAIL:  The patient was brought to the operative theater.  ? Once adequate anesthesia, preoperative antibiotics, 2 gm of Ancef,1 gm of Tranexamic Acid, and 10 mg of Decadron administered, the patient was positioned supine with a right thigh tourniquet placed.  The  ?right lower extremity was prepped and draped in sterile fashion.  A time-  ? out was performed identifying the patient, planned procedure, and the appropriate extremity.  ?   ? The right lower extremity was placed in the [facility] leg holder.  The leg was  ? exsanguinated, tourniquet elevated to 225 mmHg.  A midline incision was  ? made  followed by median parapatellar arthrotomy.  Following initial  ? exposure, attention was first directed to the patella.  Precut  ? measurement was noted to be 23 mm.  I resected down to 14 mm and used a  ? 35 anatomic patellar button to restore patellar height as well as cover the cut surface.  ?   ? The lug holes were drilled and a metal shim was placed to protect the  ? patella from retractors and saw blade during the procedure.  ?   ? At this point, attention was now directed to the femur.  The femoral  ? canal was opened with a drill, irrigated to try to prevent fat emboli.  An  ? intramedullary rod was passed at 3 degrees valgus,11 mm of bone was  ? resected off the distal femur.  Following this resection, the tibia was  ? subluxated anteriorly.  Using the extramedullary guide, 2 mm of bone was resected off  ? the proximal lateral tibia.  We confirmed the gap would be  ? stable medially and laterally with a size 6 spacer block as well as confirmed that the tibial cut was perpendicular in the coronal plane, checking with an alignment rod.  ?   ?  Once this was done, I sized the femur to be a size 5 in the anterior-  ? posterior dimension, chose a narrow component based on medial and  ? lateral dimension.  The size 5 rotation block was then pinned in  ? position anterior referenced using the C-clamp to set rotation.  The  ? anterior, posterior, and  chamfer cuts were made without difficulty nor  ? notching making certain that I was along the anterior cortex to help  ? with flexion gap stability.  ?   ? The final box cut was made off the lateral aspect of distal femur.  ?   ? At this point, the tibia was sized to be a size 4.  The size 4 tray was  ? then pinned in position through the medial third of the tubercle,  ? drilled, and keel punched.  Trial reduction was now carried with a 5 femur, ? 4 tibia, a size 8 mm PS insert, and the 35 anatomic patella botton.  The knee was brought to full extension with good flexion stability with the patella  ? tracking through the trochlea without application of pressure.  Given  ? all these findings the trial components removed.  Final components were  ? opened and cement was mixed.  The knee was irrigated with normal saline solution and pulse lavage.  The synovial lining was  ? then injected with 30 cc of 0.25% Marcaine with epinephrine, 1 cc of Toradol and 30 cc of NS for a total of 61 cc.  ?   ?Final implants were then cemented onto cleaned and dried cut surfaces of bone with the knee brought to extension with a size 8 mm PS trial insert.  ?   ? Once the cement had fully cured, excess cement was removed  ? throughout the knee.  I confirmed that I was satisfied with the range of  ? motion and stability, and the final size 8 mm PS AOX insert was chosen.  It was  ? placed into the knee.  ?   ? The tourniquet had been let down at 30 minutes.  No significant  ? hemostasis was required.  The extensor mechanism was then reapproximated using #1 Vicryl and #1  Stratafix sutures with the knee  ? in flexion.  The  ? remaining wound  was closed with 2-0 Vicryl and running 4-0 Monocryl.  ? The knee was cleaned, dried, dressed sterilely using Dermabond and  ? Aquacel dressing.  The patient was then  ? brought to recovery room in stable condition, tolerating the procedure  ? well.  ? ?Please note that Physician Assistant, Costella Hatcher, PA-C was present for the entirety of the case, and was utilized for pre-operative positioning, peri-operative retractor management, general facilitation of the procedure and for primary wound closure at the end of the case. ?   ?   ?   ?   ? Pietro Cassis Alvan Dame, M.D.  ? ? ?05/04/2021 8:30 AM  ?

## 2021-05-04 NOTE — Transfer of Care (Signed)
Immediate Anesthesia Transfer of Care Note ? ?Patient: Shawna [name] ? ?Procedure(s) Performed: Procedure(s): ?TOTAL KNEE ARTHROPLASTY (Right) ? ?Patient Location: PACU ? ?Anesthesia Type:Spinal ? ?Level of Consciousness: awake, alert  and oriented ? ?Airway & Oxygen Therapy: Patient Spontanous Breathing ? ?Post-op Assessment: Report given to RN and Post -op Vital signs reviewed and stable ? ?Post vital signs: Reviewed and stable ? ?Last Vitals:  ?Vitals:  ? 05/04/21 0545  ?BP: (!) 155/80  ?Pulse: 79  ?Resp: 20  ?Temp: 36.8 ?C  ?SpO2: 100%  ? ? ?Complications: No apparent anesthesia complications ? ?

## 2021-05-04 NOTE — Evaluation (Signed)
Physical Therapy Evaluation ?Patient Details ?Name: NIXON SPARR ?MRN: [id number] ?DOB: July 15, 1940 ?Today's Date: 05/04/2021 ? ?History of Present Illness ? 81 yo female s/p R TKA on 05/04/21. PMH: breast CA, HTN, neuropathy  ?Clinical Impression ? Pt is s/p TKA resulting in the deficits listed below (see PT Problem List).  ?Assisted pt to [facility] (incontinent of urine in bed). Limited to short distance amb in room d/t dizziness, unsteady gait and need for toileting.  Anticipate steady progress in acute setting. ? Pt will benefit from skilled PT to increase their independence and safety with mobility to allow discharge to the venue listed below.  ?   ?   ? ?Recommendations for follow up therapy are one component of a multi-disciplinary discharge planning process, led by the attending physician.  Recommendations may be updated based on patient status, additional functional criteria and insurance authorization. ? ?Follow Up Recommendations Follow physician's recommendations for discharge plan and follow up therapies ? ?  ?Assistance Recommended at Discharge Intermittent Supervision/Assistance  ?Patient can return home with the following ? A little help with walking and/or transfers;Assist for transportation;Help with stairs or ramp for entrance;Assistance with cooking/housework ? ?  ?Equipment Recommendations Rolling walker (2 wheels)  ?Recommendations for Other Services ?    ?  ?Functional Status Assessment Patient has had a recent decline in their functional status and demonstrates the ability to make significant improvements in function in a reasonable and predictable amount of time.  ? ?  ?Precautions / Restrictions Precautions ?Precautions: Knee;Fall ?Restrictions ?Weight Bearing Restrictions: No ?RLE Weight Bearing: Weight bearing as tolerated ?Other Position/Activity Restrictions: WBAT  ? ?  ? ?Mobility ? Bed Mobility ?Overal bed mobility: Needs Assistance ?Bed Mobility: Supine to Sit ?  ?  ?Supine to sit: Min  assist ?  ?  ?General bed mobility comments: assist with RLE, incr time and effort ?  ? ?Transfers ?Overall transfer level: Needs assistance ?Equipment used: Rolling walker (2 wheels) ?Transfers: Sit to/from Stand, Bed to chair/wheelchair/BSC ?Sit to Stand: Min assist, Mod assist ?  ?Step pivot transfers: Min assist, Mod assist ?  ?  ?  ?General transfer comment: assist to rise and transition to RW, cues for hand placement and RLE position; stand-step pivot bed to [facility] (incontinent of urine/purewick in place with bed soaked through to fitted sheet) ?  ? ?Ambulation/Gait ?Ambulation/Gait assistance: Min assist, Mod assist ?Gait Distance (Feet): 5 Feet ?Assistive device: Rolling walker (2 wheels) ?Gait Pattern/deviations: Step-to pattern, Wide base of support, Decreased step length - left, Decreased step length - right ?  ?  ?  ?General Gait Details: assist to balance, cues for sequence and RW position ? ?Stairs ?  ?  ?  ?  ?  ? ?Wheelchair Mobility ?  ? ?Modified Rankin (Stroke Patients Only) ?  ? ?  ? ?Balance Overall balance assessment: Needs assistance ?Sitting-balance support: Feet supported, No upper extremity supported ?Sitting balance-Leahy Scale: Good ?  ?  ?Standing balance support: During functional activity, Bilateral upper extremity supported, Reliant on assistive device for balance ?Standing balance-Leahy Scale: Poor ?  ?  ?  ?  ?  ?  ?  ?  ?  ?  ?  ?  ?   ? ? ? ?Pertinent Vitals/Pain Pain Assessment ?Pain Assessment: 0-10 ?Pain Location: right knee ?Pain Descriptors / Indicators: Heaviness ?Pain Intervention(s): Repositioned, Premedicated before session, Monitored during session, Limited activity within patient's tolerance  ? ? ?Home Living Family/patient expects to be discharged to:: Private residence ?Living  Arrangements: Children (son and dtr) ?Available Help at Discharge: Family;Available 24 hours/day ?Type of Home: House ?Home Access: Stairs to enter ?  ?Entrance Stairs-Number of Steps: 2 ?  ?Home  Layout: Two level;Able to live on main level with bedroom/bathroom ?Home Equipment: None ?   ?  ?Prior Function Prior Level of Function : Independent/Modified Independent ?  ?  ?  ?  ?  ?  ?  ?  ?  ? ? ?Hand Dominance  ?   ? ?  ?Extremity/Trunk Assessment  ? Upper Extremity Assessment ?Upper Extremity Assessment: Overall WFL for tasks assessed ?  ? ?Lower Extremity Assessment ?Lower Extremity Assessment: RLE deficits/detail ?RLE Deficits / Details: ankle WFL, knee extension and hip flexion 2+/5; AAROM grossly 0 to 70 degrees flexion ?  ? ?   ?Communication  ? Communication: No difficulties  ?Cognition Arousal/Alertness: Awake/alert ?Behavior During Therapy: [facility] for tasks assessed/performed ?Overall Cognitive Status: Within Functional Limits for tasks assessed ?  ?  ?  ?  ?  ?  ?  ?  ?  ?  ?  ?  ?  ?  ?  ?  ?  ?  ?  ? ?  ?General Comments   ? ?  ?Exercises Total Joint Exercises ?Ankle Circles/Pumps: AROM, Both, 10 reps ?Quad Sets: Both, AROM, 5 reps  ? ?Assessment/Plan  ?  ?PT Assessment    ?PT Problem List   ? ?   ?  ?PT Treatment Interventions     ? ?PT Goals (Current goals can be found in the Care Plan section)  ?Acute Rehab PT Goals ?PT Goal Formulation: With patient ?Time For Goal Achievement: 05/11/21 ?Potential to Achieve Goals: Good ? ?  ?Frequency   ?  ? ? ?Co-evaluation   ?  ?  ?  ?  ? ? ?  ?AM-PAC PT "6 Clicks" Mobility  ?Outcome Measure Help needed turning from your back to your side while in a flat bed without using bedrails?: A Little ?Help needed moving from lying on your back to sitting on the side of a flat bed without using bedrails?: A Little ?Help needed moving to and from a bed to a chair (including a wheelchair)?: A Lot ?Help needed standing up from a chair using your arms (e.g., wheelchair or bedside chair)?: A Lot ?Help needed to walk in hospital room?: A Lot ?Help needed climbing 3-5 steps with a railing? : A Lot ?6 Click Score: 14 ? ?  ?End of Session Equipment Utilized During Treatment:  Gait belt ?Activity Tolerance: Patient tolerated treatment well ?Patient left: in chair;with call bell/phone within reach;with chair alarm set;with family/visitor present ?Nurse Communication: Mobility status ?PT Visit Diagnosis: Other abnormalities of gait and mobility (R26.89);Difficulty in walking, not elsewhere classified (R26.2) ?  ? ?Time: 8299-3716 ?PT Time Calculation (min) (ACUTE ONLY): 28 min ? ? ?Charges:   PT Evaluation ?$PT Eval Low Complexity: 1 Low ?PT Treatments ?$Therapeutic Activity: 8-22 mins ?  ?   ? ? ?Baxter Flattery, PT ? ?Acute Rehab Dept [facility]) [phone] ?Pager [phone] ? ?05/04/2021 ? ? ?Shuan Statzer ?05/04/2021, 4:31 PM ? ?

## 2021-05-04 NOTE — Anesthesia Postprocedure Evaluation (Signed)
Anesthesia Post Note ? ?Patient: Shawna [name] ? ?Procedure(s) Performed: TOTAL KNEE ARTHROPLASTY (Right: Knee) ? ?  ? ?Patient location during evaluation: PACU ?Anesthesia Type: Regional and Spinal ?Level of consciousness: awake ?Pain management: pain level controlled ?Vital Signs Assessment: post-procedure vital signs reviewed and stable ?Respiratory status: spontaneous breathing, nonlabored ventilation, respiratory function stable and patient connected to nasal cannula oxygen ?Cardiovascular status: stable and blood pressure returned to baseline ?Postop Assessment: no apparent nausea or vomiting ?Anesthetic complications: no ? ? ?No notable events documented. ? ?Last Vitals:  ?Vitals:  ? 05/04/21 1220 05/04/21 1529  ?BP: (!) 146/79 134/82  ?Pulse: 70 74  ?Resp: 14 18  ?Temp: 36.4 ?C 36.7 ?C  ?SpO2: 95% 99%  ?  ?Last Pain:  ?Vitals:  ? 05/04/21 1720  ?TempSrc:   ?PainSc: 3   ? ? ?  ?  ?  ?  ?  ?  ? ?[name] P Bevelyn Arriola ? ? ? ? ?

## 2021-05-04 NOTE — Anesthesia Procedure Notes (Signed)
Spinal ? ?Patient location during procedure: OR ?Start time: 05/04/2021 7:15 AM ?End time: 05/04/2021 7:20 AM ?Reason for block: surgical anesthesia ?Staffing ?Performed: anesthesiologist  ?Anesthesiologist: Murvin Natal, MD ?Preanesthetic Checklist ?Completed: patient identified, IV checked, risks and benefits discussed, surgical consent, monitors and equipment checked, pre-op evaluation and timeout performed ?Spinal Block ?Patient position: sitting ?Prep: DuraPrep ?Patient monitoring: cardiac monitor, continuous pulse ox and blood pressure ?Approach: midline ?Location: L4-5 ?Injection technique: single-shot ?Needle ?Needle type: Pencan  ?Needle gauge: 24 G ?Needle length: 9 cm ?Assessment ?Sensory level: T10 ?Events: CSF return ?Additional Notes ?Functioning IV was confirmed and monitors were applied. Sterile prep and drape, including hand hygiene and sterile gloves were used. The patient was positioned and the spine was prepped. The skin was anesthetized with lidocaine.  Free flow of clear CSF was obtained prior to injecting local anesthetic into the CSF.  The spinal needle aspirated freely following injection.  The needle was carefully withdrawn.  The patient tolerated the procedure well.  ? ? ? ?

## 2021-05-04 NOTE — Interval H&P Note (Signed)
History and Physical Interval Note: ? ?05/04/2021 ?6:45 AM ? ?Shawna [name]  has presented today for surgery, with the diagnosis of Right knee osteoarthritis.  The various methods of treatment have been discussed with the patient and family. After consideration of risks, benefits and other options for treatment, the patient has consented to  Procedure(s): ?TOTAL KNEE ARTHROPLASTY (Right) as a surgical intervention.  The patient's history has been reviewed, patient examined, no change in status, stable for surgery.  I have reviewed the patient's chart and labs.  Questions were answered to the patient's satisfaction.   ? ? ?Mauri Pole ? ? ?

## 2021-05-04 NOTE — Discharge Instructions (Signed)

## 2021-05-04 NOTE — Anesthesia Preprocedure Evaluation (Signed)
Anesthesia Evaluation  ?Patient identified by MRN, date of birth, ID band ?Patient awake ? ? ? ?Reviewed: ?Allergy & Precautions, NPO status , Patient's Chart, lab work & pertinent test results ? ?Airway ?Mallampati: III ? ?TM Distance: >3 FB ?Neck ROM: Full ? ? ? Dental ?  ?Pulmonary ?neg pulmonary ROS,  ?  ?Pulmonary exam normal ? ? ? ? ? ? ? Cardiovascular ?hypertension, Pt. on medications ?Normal cardiovascular exam ? ? ?  ?Neuro/Psych ?negative neurological ROS ? negative psych ROS  ? GI/Hepatic ?negative GI ROS, Neg liver ROS,   ?Endo/Other  ?negative endocrine ROS ? Renal/GU ?negative Renal ROS  ? ?  ?Musculoskeletal ? ?(+) Arthritis , Osteoarthritis,   ? Abdominal ?  ?Peds ? Hematology ?negative hematology ROS ?(+)   ?Anesthesia Other Findings ?Right knee osteoarthritis ? Reproductive/Obstetrics ? ?  ? ? ? ? ? ? ? ? ? ? ? ? ? ?  ?  ? ? ? ? ? ? ? ? ?Anesthesia Physical ?Anesthesia Plan ? ?ASA: 2 ? ?Anesthesia Plan: Spinal and Regional  ? ?Post-op Pain Management:   ? ?Induction: Intravenous ? ?PONV Risk Score and Plan: 2 and Ondansetron, Dexamethasone, Propofol infusion and Treatment may vary due to age or medical condition ? ?Airway Management Planned: Simple Face Mask ? ?Additional Equipment:  ? ?Intra-op Plan:  ? ?Post-operative Plan:  ? ?Informed Consent: I have reviewed the patients History and Physical, chart, labs and discussed the procedure including the risks, benefits and alternatives for the proposed anesthesia with the patient or authorized representative who has indicated his/her understanding and acceptance.  ? ? ? ?Dental advisory given ? ?Plan Discussed with: CRNA ? ?Anesthesia Plan Comments:   ? ? ? ? ? ? ?Anesthesia Quick Evaluation ? ?

## 2021-05-04 NOTE — Anesthesia Procedure Notes (Signed)
Anesthesia Regional Block: Adductor canal block  ? ?Pre-Anesthetic Checklist: , timeout performed,  Correct Patient, Correct Site, Correct Laterality,  Correct Procedure,, site marked,  Risks and benefits discussed,  Surgical consent,  Pre-op evaluation,  At surgeon's request and post-op pain management ? ?Laterality: Right ? ?Prep: chloraprep     ?  ?Needles:  ?Injection technique: Single-shot ? ?Needle Type: Echogenic Stimulator Needle   ? ? ?Needle Length: 9cm  ?Needle Gauge: 21  ? ? ? ?Additional Needles: ? ? ?Procedures:,,,, ultrasound used (permanent image in chart),,    ?Narrative:  ?Start time: 05/04/2021 6:35 AM ?End time: 05/04/2021 6:45 AM ?Injection made incrementally with aspirations every 5 mL. ? ?Performed by: Personally  ?Anesthesiologist: Murvin Natal, MD ? ?Additional Notes: ?Functioning IV was confirmed and monitors were applied. A time-out was performed. Hand hygiene and sterile gloves were used. The thigh was placed in a frog-leg position and prepped in a sterile fashion. A 90m 21ga Arrow echogenic stimulator needle was placed using ultrasound guidance.  Negative aspiration and negative test dose prior to incremental administration of local anesthetic. The patient tolerated the procedure well. ? ? ? ? ? ?

## 2021-05-05 ENCOUNTER — Encounter (HOSPITAL_COMMUNITY): Payer: Self-pay | Admitting: Orthopedic Surgery

## 2021-05-05 DIAGNOSIS — M1711 Unilateral primary osteoarthritis, right knee: Secondary | ICD-10-CM | POA: Diagnosis not present

## 2021-05-05 DIAGNOSIS — Z79899 Other long term (current) drug therapy: Secondary | ICD-10-CM | POA: Diagnosis not present

## 2021-05-05 DIAGNOSIS — I10 Essential (primary) hypertension: Secondary | ICD-10-CM | POA: Diagnosis not present

## 2021-05-05 DIAGNOSIS — Z96651 Presence of right artificial knee joint: Secondary | ICD-10-CM | POA: Diagnosis not present

## 2021-05-05 DIAGNOSIS — Z85828 Personal history of other malignant neoplasm of skin: Secondary | ICD-10-CM | POA: Diagnosis not present

## 2021-05-05 DIAGNOSIS — Z853 Personal history of malignant neoplasm of breast: Secondary | ICD-10-CM | POA: Diagnosis not present

## 2021-05-05 LAB — BASIC METABOLIC PANEL
Anion gap: 7 (ref 5–15)
BUN: 28 mg/dL — ABNORMAL HIGH (ref 8–23)
CO2: 22 mmol/L (ref 22–32)
Calcium: 8.1 mg/dL — ABNORMAL LOW (ref 8.9–10.3)
Chloride: 106 mmol/L (ref 98–111)
Creatinine, Ser: 1.13 mg/dL — ABNORMAL HIGH (ref 0.44–1.00)
GFR, Estimated: 49 mL/min — ABNORMAL LOW (ref >60)
Glucose, Bld: 138 mg/dL — ABNORMAL HIGH (ref 70–99)
Potassium: 3.8 mmol/L (ref 3.5–5.1)
Sodium: 135 mmol/L (ref 135–145)

## 2021-05-05 LAB — CBC
HCT: 32.8 % — ABNORMAL LOW (ref 36.0–46.0)
Hemoglobin: 11.2 g/dL — ABNORMAL LOW (ref 12.0–15.0)
MCH: 33 pg (ref 26.0–34.0)
MCHC: 34.1 g/dL (ref 30.0–36.0)
MCV: 96.8 fL (ref 80.0–100.0)
Platelets: 165 10*3/uL (ref 150–400)
RBC: 3.39 MIL/uL — ABNORMAL LOW (ref 3.87–5.11)
RDW: 13.2 % (ref 11.5–15.5)
WBC: 8.2 10*3/uL (ref 4.0–10.5)
nRBC: 0 % (ref 0.0–0.2)

## 2021-05-05 MED ORDER — POLYETHYLENE GLYCOL 3350 17 G PO PACK: 17.0000 g | PACK | Freq: Every day | ORAL | 0 refills | Status: DC | PRN

## 2021-05-05 MED ORDER — OXYCODONE HCL 5 MG PO TABS: 5.0000 mg | ORAL_TABLET | ORAL | 0 refills | Status: DC | PRN

## 2021-05-05 MED ORDER — ASPIRIN 81 MG PO CHEW
81.0000 mg | CHEWABLE_TABLET | Freq: Two times a day (BID) | ORAL | 0 refills | Status: AC
Start: 2021-05-05 — End: 2021-06-02

## 2021-05-05 MED ORDER — DOCUSATE SODIUM 100 MG PO CAPS: 100.0000 mg | ORAL_CAPSULE | Freq: Two times a day (BID) | ORAL | 0 refills | Status: DC

## 2021-05-05 MED ORDER — METHOCARBAMOL 500 MG PO TABS: 500.0000 mg | ORAL_TABLET | Freq: Four times a day (QID) | ORAL | 0 refills | Status: DC | PRN

## 2021-05-05 NOTE — Progress Notes (Signed)
Discharge instructions given to patient and all questions were answered.  

## 2021-05-05 NOTE — Progress Notes (Signed)
Physical Therapy Treatment ?Patient Details ?Name: Shawna [name] ?MRN: [id number] ?DOB: 1940/08/24 ?Today's Date: 05/05/2021 ? ? ?History of Present Illness 81 yo female s/p R TKA on 05/04/21. PMH: breast CA, HTN, neuropathy ? ?  ?PT Comments  ? ? Progressing with mobility. Reviewed/practiced gait training and stair negotiation training. Moderate pain during session. Encouraged pt to ambulate often at home. Issued HEP for her to perform exercises, as tolerated, until she begins OP PT. All education completed.  ?   ?Recommendations for follow up therapy are one component of a multi-disciplinary discharge planning process, led by the attending physician.  Recommendations may be updated based on patient status, additional functional criteria and insurance authorization. ? ?Follow Up Recommendations ? Follow physician's recommendations for discharge plan and follow up therapies ?  ?  ?Assistance Recommended at Discharge Frequent or constant Supervision/Assistance  ?Patient can return home with the following A little help with walking and/or transfers;Assist for transportation;Help with stairs or ramp for entrance;Assistance with cooking/housework ?  ?Equipment Recommendations ?    ?  ?Recommendations for Other Services   ? ? ?  ?Precautions / Restrictions Precautions ?Precautions: Fall;Knee ?Precaution Comments: neuropathy ?Restrictions ?Weight Bearing Restrictions: No ?RLE Weight Bearing: Weight bearing as tolerated  ?  ? ?Mobility ? Bed Mobility ?Overal bed mobility: Needs Assistance ?Bed Mobility: Sit to Supine ?  ?  ?  ?Sit to supine: Supervision ?  ?General bed mobility comments: oob in recliner ?  ? ?Transfers ?Overall transfer level: Needs assistance ?Equipment used: Rolling walker (2 wheels) ?Transfers: Sit to/from Stand ?Sit to Stand: Min guard ?  ?  ?  ?  ?  ?General transfer comment: Cues for safety, technique, hand placement. Min guard for safety. ?  ? ?Ambulation/Gait ?Ambulation/Gait assistance: Min  guard ?Gait Distance (Feet): 70 Feet ?Assistive device: Rolling walker (2 wheels) ?Gait Pattern/deviations: Step-to pattern ?  ?  ?  ?General Gait Details: Cues for safety, technique, sequence. Intermittent assist to steady. Pt denied dizziness. Min guard for safety ? ? ?Stairs ?Stairs: Yes ?Stairs assistance: Min assist ?Stair Management: Step to pattern, Two rails, Forwards, With walker ?Number of Stairs: 3 (x3) ?General stair comments: Pt stated she has a door frame that she can hold onto with both hands. Practiced stairs x 2 (once with 2 handrail to simulate door frame, once with RW in case she has trouble using door frame). Cues for safety, technique, sequence. Min A to steady ? ? ?Wheelchair Mobility ?  ? ?Modified Rankin (Stroke Patients Only) ?  ? ? ?  ?Balance Overall balance assessment: Needs assistance ?  ?  ?  ?  ?Standing balance support: During functional activity, Bilateral upper extremity supported, Reliant on assistive device for balance ?Standing balance-Leahy Scale: Poor ?  ?  ?  ?  ?  ?  ?  ?  ?  ?  ?  ?  ?  ? ?  ?Cognition Arousal/Alertness: Awake/alert ?Behavior During Therapy: [facility] for tasks assessed/performed ?Overall Cognitive Status: Within Functional Limits for tasks assessed ?  ?  ?  ?  ?  ?  ?  ?  ?  ?  ?  ?  ?  ?  ?  ?  ?  ?  ?  ? ?  ?Exercises Total Joint Exercises ?Ankle Circles/Pumps: AROM, Both, 10 reps ?Quad Sets: AROM, Both, 10 reps ?Hip ABduction/ADduction: AROM, Right, 10 reps ?Straight Leg Raises: AROM, Right, 10 reps ?Knee Flexion: AAROM, AROM, Right, 10 reps, Seated ?Goniometric ROM: ~10-65  degrees ? ?  ?General Comments   ?  ?  ? ?Pertinent Vitals/Pain Pain Assessment ?Pain Assessment: 0-10 ?Pain Score: 7  ?Pain Location: right knee ?Pain Descriptors / Indicators: Discomfort, Sharp, Sore, Aching ?Pain Intervention(s): Limited activity within patient's tolerance, Monitored during session, Ice applied, Repositioned  ? ? ?Home Living   ?  ?  ?  ?  ?  ?  ?  ?  ?  ?   ?  ?Prior  Function    ?  ?  ?   ? ?PT Goals (current goals can now be found in the care plan section) Progress towards PT goals: Progressing toward goals ? ?  ?Frequency ? ? ? 7X/week ? ? ? ?  ?PT Plan Current plan remains appropriate  ? ? ?Co-evaluation   ?  ?  ?  ?  ? ?  ?AM-PAC PT "6 Clicks" Mobility   ?Outcome Measure ? Help needed turning from your back to your side while in a flat bed without using bedrails?: None ?Help needed moving from lying on your back to sitting on the side of a flat bed without using bedrails?: None ?Help needed moving to and from a bed to a chair (including a wheelchair)?: A Little ?Help needed standing up from a chair using your arms (e.g., wheelchair or bedside chair)?: A Little ?Help needed to walk in hospital room?: A Little ?Help needed climbing 3-5 steps with a railing? : A Little ?6 Click Score: 20 ? ?  ?End of Session Equipment Utilized During Treatment: Gait belt ?Activity Tolerance: Patient tolerated treatment well ?Patient left: in bed;with call bell/phone within reach;with family/visitor present ?  ?PT Visit Diagnosis: Other abnormalities of gait and mobility (R26.89);Pain ?Pain - Right/Left: Right ?Pain - part of body: Knee ?  ? ? ?Time: 1130-1145 ?PT Time Calculation (min) (ACUTE ONLY): 15 min ? ?Charges:  $Gait Training: 8-22 mins          ?          ? ? ? ? ? ?Doreatha Massed, PT ?Acute Rehabilitation  ?Office: [phone] ?Pager: [phone] ? ?  ? ?

## 2021-05-05 NOTE — TOC Transition Note (Signed)
Transition of Care (TOC) - CM/SW Discharge Note ? ?Patient Details  ?Name: Shawna [name] ?MRN: [id number] ?Date of Birth: Jan 24, 1940 ? ?Transition of Care (TOC) CM/SW Contact:  ?Sherie Don, LCSW ?Phone Number: ?05/05/2021, 9:41 AM ? ?Clinical Narrative: Patient is expected to discharge home after working with PT. CSW met with patient and her daughter to confirm discharge plan and needs. Patient will discharge home with OPPT at [facility]. Patient will need a rolling walker, which was delivered to the room by [employer]. TOC signing off. ? ?Final next level of care: OP Rehab ?Barriers to Discharge: No Barriers Identified ? ?Patient Goals and CMS Choice ?Patient states their goals for this hospitalization and ongoing recovery are:: Discharge home with OPPT at [facility] ?CMS Medicare.gov Compare Post Acute Care list provided to:: Patient ?Choice offered to / list presented to : Patient ? ?Discharge Plan and Services        ?DME Arranged: Walker rolling ?DME Agency: [employer] ?Representative spoke with at DME Agency: Prearranged in orthopedist's office ? ?Readmission Risk Interventions ?   ? View : No data to display.  ?  ?  ?  ? ?

## 2021-05-05 NOTE — Progress Notes (Addendum)
? ?Subjective: ?1 Day Post-Op Procedure(s) (LRB): ?TOTAL KNEE ARTHROPLASTY (Right) ?Patient reports pain as mild.   ?Patient seen in rounds with Dr. Alvan Dame. ?Patient is well, and has had no acute complaints or problems. No acute events overnight. Voiding without difficulty. Patient ambulated 5 feet with PT.  ?We will start therapy today.  ? ?Objective: ?Vital signs in last 24 hours: ?Temp:  [97 ?F (36.1 ?C)-98.5 ?F (36.9 ?C)] 98.2 ?F (36.8 ?C) (04/19 1062) ?Pulse Rate:  [66-87] 85 (04/19 0558) ?Resp:  [9-19] 17 (04/19 0558) ?BP: (124-177)/(67-84) 135/72 (04/19 0558) ?SpO2:  [92 %-100 %] 98 % (04/19 0558) ? ?Intake/Output from previous day: ? ?Intake/Output Summary (Last 24 hours) at 05/05/2021 0804 ?Last data filed at 05/05/2021 0600 ?Gross per 24 hour  ?Intake 3055.22 ml  ?Output 750 ml  ?Net 2305.22 ml  ?  ? ?Intake/Output this shift: ?No intake/output data recorded. ? ?Labs: ?Recent Labs  ?  05/05/21 ?0346  ?HGB 11.2*  ? ?Recent Labs  ?  05/05/21 ?0346  ?WBC 8.2  ?RBC 3.39*  ?HCT 32.8*  ?PLT 165  ? ?Recent Labs  ?  05/05/21 ?0346  ?NA 135  ?K 3.8  ?CL 106  ?CO2 22  ?BUN 28*  ?CREATININE 1.13*  ?GLUCOSE 138*  ?CALCIUM 8.1*  ? ?No results for input(s): LABPT, INR in the last 72 hours. ? ?Exam: ?General - Patient is Alert and Oriented ?Extremity - Neurologically intact ?Sensation intact distally ?Intact pulses distally ?Dorsiflexion/Plantar flexion intact ?Dressing - dressing C/D/I ?Motor Function - intact, moving foot and toes well on exam.  ? ?Past Medical History:  ?Diagnosis Date  ? Arthritis   ? Breast cancer ([place]) 11/2014  ? Family history of breast cancer   ? Family history of colon cancer   ? History of kidney stones   ? History of radiation therapy 08/11/15- 09/23/2015  ? Right Breast  ? History of radiation therapy 11/13/17- 12/29/17  ? Right chest wall and IM nodes 50.04 Gy in 28 fractions, Right supraclavicular and PAB nodes 50.04 Gy in 28 fractions, Right Chest wall boost/ 10 Gy in 5 fractions.   ?  Hyperlipidemia   ? Hypertension   ? Monoallelic mutation of NBN gene   ? Osteoporosis   ? Pessary maintenance   ? Pneumonia   ? Skin cancer of nose   ? ? ?Assessment/Plan: ?1 Day Post-Op Procedure(s) (LRB): ?TOTAL KNEE ARTHROPLASTY (Right) ?Principal Problem: ?  S/P total knee arthroplasty, right ? ?Estimated body mass index is 21.9 kg/m? as calculated from the following: ?  Height as of this encounter: '5\' 8"'$  (1.727 m). ?  Weight as of this encounter: 65.3 kg. ?Advance diet ?Up with therapy ?D/C IV fluids ? ? ?Patient's anticipated LOS is less than 2 midnights, meeting these requirements: ?- Younger than 47 ?- Lives within 1 hour of care ?- Has a competent adult at home to recover with post-op recover ?- NO history of ? - Chronic pain requiring opiods ? - Diabetes ? - Coronary Artery Disease ? - Heart failure ? - Heart attack ? - Stroke ? - DVT/VTE ? - Cardiac arrhythmia ? - Respiratory Failure/COPD ? - Renal failure ? - Anemia ? - Advanced Liver disease ? ?  ? ?DVT Prophylaxis - Aspirin ?Weight bearing as tolerated. ? ?Hgb stable at 11.2 this AM. ? ?Plan is to go Home after hospital stay. Plan for discharge today following 1-2 sessions of PT as long as they are meeting their goals. Patient is scheduled for  OPPT. Follow up in the office in 2 weeks.  ? ?Griffith Citron, PA-C ?Orthopedic Surgery ?[phone] ?05/05/2021, 8:04 AM  ?

## 2021-05-05 NOTE — Progress Notes (Signed)
Physical Therapy Treatment ?Patient Details ?Name: Shawna [name] ?MRN: [id number] ?DOB: 06/23/1940 ?Today's Date: 05/05/2021 ? ? ?History of Present Illness 81 yo female s/p R TKA on 05/04/21. PMH: breast CA, HTN, neuropathy ? ?  ?PT Comments  ? ? Progressing with mobility. Moderate pain with activity. LOB-balance issues 2* neuropathy. Will plan to have a 2nd session to practice stair negotiation prior to potential d/c home if PT goals met.    ?Recommendations for follow up therapy are one component of a multi-disciplinary discharge planning process, led by the attending physician.  Recommendations may be updated based on patient status, additional functional criteria and insurance authorization. ? ?Follow Up Recommendations ? Follow physician's recommendations for discharge plan and follow up therapies ?  ?  ?Assistance Recommended at Discharge Frequent or constant Supervision/Assistance  ?Patient can return home with the following A little help with walking and/or transfers;Assist for transportation;Help with stairs or ramp for entrance;Assistance with cooking/housework ?  ?Equipment Recommendations ?    ?  ?Recommendations for Other Services   ? ? ?  ?Precautions / Restrictions Precautions ?Precautions: Knee;Fall ?Precaution Comments: neuropathy ?Restrictions ?Weight Bearing Restrictions: No ?RLE Weight Bearing: Weight bearing as tolerated  ?  ? ?Mobility ? Bed Mobility ?  ?  ?  ?  ?  ?  ?  ?General bed mobility comments: oob in recliner ?  ? ?Transfers ?Overall transfer level: Needs assistance ?Equipment used: Rolling walker (2 wheels) ?Transfers: Sit to/from Stand ?Sit to Stand: Mod assist ?  ?  ?  ?  ?  ?General transfer comment: Assist to rise, stabilize, control descent. LOB during transition-required assistance from therapist to prevent fall. Cues for safety, technique, hand placement ?  ? ?Ambulation/Gait ?Ambulation/Gait assistance: Min assist ?Gait Distance (Feet): 70 Feet ?Assistive device: Rolling  walker (2 wheels) ?Gait Pattern/deviations: Step-to pattern ?  ?  ?  ?General Gait Details: Cues for safety, technique, sequence. Intermittent assist to steady. Pt denied dizziness. ? ? ?Stairs ?  ?  ?  ?  ?  ? ? ?Wheelchair Mobility ?  ? ?Modified Rankin (Stroke Patients Only) ?  ? ? ?  ?Balance Overall balance assessment: Needs assistance ?  ?  ?  ?  ?Standing balance support: During functional activity, Bilateral upper extremity supported, Reliant on assistive device for balance ?Standing balance-Leahy Scale: Poor ?  ?  ?  ?  ?  ?  ?  ?  ?  ?  ?  ?  ?  ? ?  ?Cognition Arousal/Alertness: Awake/alert ?Behavior During Therapy: [facility] for tasks assessed/performed ?Overall Cognitive Status: Within Functional Limits for tasks assessed ?  ?  ?  ?  ?  ?  ?  ?  ?  ?  ?  ?  ?  ?  ?  ?  ?  ?  ?  ? ?  ?Exercises Total Joint Exercises ?Ankle Circles/Pumps: AROM, Both, 10 reps ?Quad Sets: AROM, Both, 10 reps ?Hip ABduction/ADduction: AROM, Right, 10 reps ?Straight Leg Raises: AROM, Right, 10 reps ?Knee Flexion: AAROM, AROM, Right, 10 reps, Seated ?Goniometric ROM: ~10-65 degrees ? ?  ?General Comments   ?  ?  ? ?Pertinent Vitals/Pain Pain Assessment ?Pain Assessment: 0-10 ?Pain Score: 8  ?Pain Location: right knee ?Pain Descriptors / Indicators: Discomfort, Sharp, Sore, Aching ?Pain Intervention(s): Limited activity within patient's tolerance, Monitored during session, Premedicated before session, Ice applied  ? ? ?Home Living   ?  ?  ?  ?  ?  ?  ?  ?  ?  ?   ?  ?  Prior Function    ?  ?  ?   ? ?PT Goals (current goals can now be found in the care plan section) Progress towards PT goals: Progressing toward goals ? ?  ?Frequency ? ? ? 7X/week ? ? ? ?  ?PT Plan Current plan remains appropriate  ? ? ?Co-evaluation   ?  ?  ?  ?  ? ?  ?AM-PAC PT "6 Clicks" Mobility   ?Outcome Measure ? Help needed turning from your back to your side while in a flat bed without using bedrails?: A Little ?Help needed moving from lying on your back to  sitting on the side of a flat bed without using bedrails?: A Little ?Help needed moving to and from a bed to a chair (including a wheelchair)?: A Little ?Help needed standing up from a chair using your arms (e.g., wheelchair or bedside chair)?: A Little ?Help needed to walk in hospital room?: A Little ?Help needed climbing 3-5 steps with a railing? : A Lot ?6 Click Score: 17 ? ?  ?End of Session Equipment Utilized During Treatment: Gait belt ?Activity Tolerance: Patient tolerated treatment well ?Patient left: in chair;with call bell/phone within reach;with family/visitor present ?  ?PT Visit Diagnosis: Other abnormalities of gait and mobility (R26.89);Difficulty in walking, not elsewhere classified (R26.2) ?  ? ? ?Time: [phone] ?PT Time Calculation (min) (ACUTE ONLY): 22 min ? ?Charges:  $Gait Training: 8-22 mins          ?          ? ? ? ? ?Doreatha Massed, PT ?Acute Rehabilitation  ?Office: [phone] ?Pager: [phone] ? ?  ? ?

## 2021-05-07 DIAGNOSIS — M25561 Pain in right knee: Secondary | ICD-10-CM | POA: Diagnosis not present

## 2021-05-10 DIAGNOSIS — M25561 Pain in right knee: Secondary | ICD-10-CM | POA: Diagnosis not present

## 2021-05-12 DIAGNOSIS — M25561 Pain in right knee: Secondary | ICD-10-CM | POA: Diagnosis not present

## 2021-05-13 NOTE — Discharge Summary (Signed)
Patient ID: ?OLUWATENIOLA [name] ?MRN: [id number] ?DOB/AGE: 04/10/1940 81 y.o. ? ?Admit date: 05/04/2021 ?Discharge date: 05/05/2021 ? ?Admission Diagnoses:  ?Right knee osteoarthritis ? ?Discharge Diagnoses:  ?Principal Problem: ?  S/P total knee arthroplasty, right ? ? ?Past Medical History:  ?Diagnosis Date  ? Arthritis   ? Breast cancer ([place]) 11/2014  ? Family history of breast cancer   ? Family history of colon cancer   ? History of kidney stones   ? History of radiation therapy 08/11/15- 09/23/2015  ? Right Breast  ? History of radiation therapy 11/13/17- 12/29/17  ? Right chest wall and IM nodes 50.04 Gy in 28 fractions, Right supraclavicular and PAB nodes 50.04 Gy in 28 fractions, Right Chest wall boost/ 10 Gy in 5 fractions.   ? Hyperlipidemia   ? Hypertension   ? Monoallelic mutation of NBN gene   ? Osteoporosis   ? Pessary maintenance   ? Pneumonia   ? Skin cancer of nose   ? ? ?Surgeries: Procedure(s): ?TOTAL KNEE ARTHROPLASTY on 05/04/2021 ?  ?Consultants:  ? ?Discharged Condition: Improved ? ?Hospital Course: Shawna [name] is an 81 y.o. female who was admitted 05/04/2021 for operative treatment ofS/P total knee arthroplasty, right. Patient has severe unremitting pain that affects sleep, daily activities, and work/hobbies. After pre-op clearance the patient was taken to the operating room on 05/04/2021 and underwent  Procedure(s): ?TOTAL KNEE ARTHROPLASTY.   ? ?Patient was given perioperative antibiotics:  ?Anti-infectives (From admission, onward)  ? ? Start     Dose/Rate Route Frequency Ordered Stop  ? 05/04/21 1400  ceFAZolin (ANCEF) IVPB 2g/100 mL premix       ? 2 g ?200 mL/hr over 30 Minutes Intravenous Every 6 hours 05/04/21 1204 05/04/21 2007  ? 05/04/21 0600  ceFAZolin (ANCEF) IVPB 2g/100 mL premix       ? 2 g ?200 mL/hr over 30 Minutes Intravenous On call to O.R. 05/04/21 4132 05/04/21 0742  ? ?  ?  ? ?Patient was given sequential compression devices, early ambulation, and chemoprophylaxis to prevent  DVT. Patient worked with PT and was meeting their goals regarding safe ambulation and transfers. ? ?Patient benefited maximally from hospital stay and there were no complications.   ? ?Recent vital signs: No data found.  ? ?Recent laboratory studies: No results for input(s): WBC, HGB, HCT, PLT, NA, K, CL, CO2, BUN, CREATININE, GLUCOSE, INR, CALCIUM in the last 72 hours. ? ?Invalid input(s): PT, 2 ? ? ?Discharge Medications:   ?Allergies as of 05/05/2021   ?No Known Allergies ?  ? ?  ?Medication List  ?  ? ?TAKE these medications   ? ?acetaminophen 650 MG CR tablet ?Commonly known as: TYLENOL ?Take 650-1,300 mg by mouth every 8 (eight) hours as needed for pain. ?  ?aspirin 81 MG chewable tablet ?Chew 1 tablet (81 mg total) by mouth 2 (two) times daily for 28 days. ?  ?Biotin 1000 MCG tablet ?Take 1,000 mcg by mouth daily. ?  ?CALTRATE 600+D PLUS PO ?Take 1 tablet by mouth in the morning. ?  ?cholecalciferol 1000 units tablet ?Commonly known as: VITAMIN D ?Take 1,000 Units by mouth daily. ?  ?Crestor 20 MG tablet ?Generic drug: rosuvastatin ?Take 20 mg by mouth in the morning. ?  ?docusate sodium 100 MG capsule ?Commonly known as: COLACE ?Take 1 capsule (100 mg total) by mouth 2 (two) times daily. ?  ?estradiol 0.1 MG/GM vaginal cream ?Commonly known as: ESTRACE ?USE 1 GRAM VAGINALLY 2 TIMES A WEEK  at bedtime ?What changed:  ?how much to take ?how to take this ?when to take this ?additional instructions ?  ?hydrochlorothiazide 12.5 MG tablet ?Commonly known as: HYDRODIURIL ?Take 12.5 mg by mouth in the morning. ?  ?losartan 100 MG tablet ?Commonly known as: COZAAR ?Take 100 mg by mouth in the morning. ?  ?methocarbamol 500 MG tablet ?Commonly known as: ROBAXIN ?Take 1 tablet (500 mg total) by mouth every 6 (six) hours as needed for muscle spasms. ?  ?mirabegron ER 25 MG Tb24 tablet ?Commonly known as: Myrbetriq ?Take 1 tablet (25 mg total) by mouth daily. One po qd ?  ?oxyCODONE 5 MG immediate release tablet ?Commonly  known as: Oxy IR/ROXICODONE ?Take 1-2 tablets (5-10 mg total) by mouth every 4 (four) hours as needed for severe pain. ?  ?polyethylene glycol 17 g packet ?Commonly known as: MIRALAX / GLYCOLAX ?Take 17 g by mouth daily as needed for mild constipation. ?  ?Systane Preservative Free 0.4-0.3 % Soln ?Generic drug: Polyethyl Glyc-Propyl Glyc PF ?Place 1-2 drops into both eyes 3 (three) times daily as needed (dry/irritated eyes). ?  ? ?  ? ?  ?  ? ? ?  ?Discharge Care Instructions  ?(From admission, onward)  ?  ? ? ?  ? ?  Start     Ordered  ? 05/05/21 0000  Change dressing       ?Comments: Maintain surgical dressing until follow up in the clinic. If the edges start to pull up, may reinforce with tape. If the dressing is no longer working, may remove and cover with gauze and tape, but must keep the area dry and clean.  Call with any questions or concerns.  ? 05/05/21 0807  ? ?  ?  ? ?  ? ? ?Diagnostic Studies: No results found. ? ?Disposition: Discharge disposition: 01-Home or Self Care ? ? ? ? ? ? ?Discharge Instructions   ? ? Call MD / Call 911   Complete by: As directed ?  ? If you experience chest pain or shortness of breath, CALL 911 and be transported to the hospital emergency room.  If you develope a fever above 101 F, pus (white drainage) or increased drainage or redness at the wound, or calf pain, call your surgeon's office.  ? Change dressing   Complete by: As directed ?  ? Maintain surgical dressing until follow up in the clinic. If the edges start to pull up, may reinforce with tape. If the dressing is no longer working, may remove and cover with gauze and tape, but must keep the area dry and clean.  Call with any questions or concerns.  ? Constipation Prevention   Complete by: As directed ?  ? Drink plenty of fluids.  Prune juice may be helpful.  You may use a stool softener, such as Colace (over the counter) 100 mg twice a day.  Use MiraLax (over the counter) for constipation as needed.  ? Diet - low sodium  heart healthy   Complete by: As directed ?  ? Increase activity slowly as tolerated   Complete by: As directed ?  ? Weight bearing as tolerated with assist device (walker, cane, etc) as directed, use it as long as suggested by your surgeon or therapist, typically at least 4-6 weeks.  ? Post-operative opioid taper instructions:   Complete by: As directed ?  ? POST-OPERATIVE OPIOID TAPER INSTRUCTIONS: ?It is important to wean off of your opioid medication as soon as possible. If you do  not need pain medication after your surgery it is ok to stop day one. ?Opioids include: ?Codeine, Hydrocodone(Norco, Vicodin), Oxycodone(Percocet, oxycontin) and hydromorphone amongst others.  ?Long term and even short term use of opiods can cause: ?Increased pain response ?Dependence ?Constipation ?Depression ?Respiratory depression ?And more.  ?Withdrawal symptoms can include ?Flu like symptoms ?Nausea, vomiting ?And more ?Techniques to manage these symptoms ?Hydrate well ?Eat regular healthy meals ?Stay active ?Use relaxation techniques(deep breathing, meditating, yoga) ?Do Not substitute Alcohol to help with tapering ?If you have been on opioids for less than two weeks and do not have pain than it is ok to stop all together.  ?Plan to wean off of opioids ?This plan should start within one week post op of your joint replacement. ?Maintain the same interval or time between taking each dose and first decrease the dose.  ?Cut the total daily intake of opioids by one tablet each day ?Next start to increase the time between doses. ?The last dose that should be eliminated is the evening dose.  ? ?  ? TED hose   Complete by: As directed ?  ? Use stockings (TED hose) for 2 weeks on both leg(s).  You may remove them at night for sleeping.  ? ?  ? ? ? Follow-up Information   ? ? Paralee Cancel, MD. Schedule an appointment as soon as possible for a visit in 2 week(s).   ?Specialty: Orthopedic Surgery ?Contact information: ?[address] ?[address] ?[place] [address] ?[phone] ? ? ?  ?  ? ?  ?  ? ?  ? ? ? ?Signed: ?Irving Copas ?05/13/2021, 7:37 AM ? ? ? ?

## 2021-05-14 DIAGNOSIS — M25561 Pain in right knee: Secondary | ICD-10-CM | POA: Diagnosis not present

## 2021-05-18 DIAGNOSIS — M25561 Pain in right knee: Secondary | ICD-10-CM | POA: Diagnosis not present

## 2021-05-20 DIAGNOSIS — M25561 Pain in right knee: Secondary | ICD-10-CM | POA: Diagnosis not present

## 2021-05-25 DIAGNOSIS — M25561 Pain in right knee: Secondary | ICD-10-CM | POA: Diagnosis not present

## 2021-05-28 DIAGNOSIS — M25561 Pain in right knee: Secondary | ICD-10-CM | POA: Diagnosis not present

## 2021-06-01 DIAGNOSIS — M25561 Pain in right knee: Secondary | ICD-10-CM | POA: Diagnosis not present

## 2021-06-04 DIAGNOSIS — M25561 Pain in right knee: Secondary | ICD-10-CM | POA: Diagnosis not present

## 2021-06-08 DIAGNOSIS — M25561 Pain in right knee: Secondary | ICD-10-CM | POA: Diagnosis not present

## 2021-06-10 DIAGNOSIS — M25561 Pain in right knee: Secondary | ICD-10-CM | POA: Diagnosis not present

## 2021-06-15 DIAGNOSIS — M25561 Pain in right knee: Secondary | ICD-10-CM | POA: Diagnosis not present

## 2021-06-21 ENCOUNTER — Encounter: Payer: Self-pay | Admitting: Obstetrics and Gynecology

## 2021-06-21 ENCOUNTER — Ambulatory Visit (INDEPENDENT_AMBULATORY_CARE_PROVIDER_SITE_OTHER): Payer: Medicare Other | Admitting: Obstetrics and Gynecology

## 2021-06-21 VITALS — BP 142/80 | HR 67 | Wt 141.0 lb

## 2021-06-21 DIAGNOSIS — N3941 Urge incontinence: Secondary | ICD-10-CM | POA: Diagnosis not present

## 2021-06-21 DIAGNOSIS — Z4689 Encounter for fitting and adjustment of other specified devices: Secondary | ICD-10-CM | POA: Diagnosis not present

## 2021-06-21 NOTE — Progress Notes (Signed)
GYNECOLOGY  VISIT   HPI: 81 y.o.   Widowed White or Caucasian Not Hispanic or Latino  female   [phone] with Patient's last menstrual period was 01/17/1982 (approximate).   here for pessary follow up. She uses a #3 cube pessary with good support. She uses estrace cream 1 x a week typically, but hasn't been able to since her knee surgery 2 months ago.  No vaginal bleeding. Prolapse is well controlled. No difficulty voiding or with BM.   H/O urge incontinence, previously didn't tolerate ditropan. At her last visit here in 2/23 she was given a script for myrbetriq. She tried it for 1 week, felt weird on it and it wasn't working well so stopped it. The leakage is tolerable, able to use a small pad.   She had knee replacement surgery 8 weeks ago. Doing okay.   GYNECOLOGIC HISTORY: Patient's last menstrual period was 01/17/1982 (approximate). 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, right ([place]) 02/24/2017   Anemia 11/03/2015   Edema 07/37/1062   Monoallelic mutation of NBN gene    Genetic testing 01/13/2015   Family history of breast cancer    Family history of colon cancer    Breast cancer of lower-inner quadrant of right female breast ([place]) 12/30/2014   Basal cell carcinoma of skin 10/30/2014   Abnormal results of liver function studies 10/30/2014   Callosity 10/30/2014   Age-related osteoporosis without current pathological fracture 07/31/2008   Essential  hypertension 07/31/2008    Past Medical History:  Diagnosis Date   Arthritis    Breast cancer ([place]) 11/2014   Family history of breast cancer    Family history of colon cancer    History of kidney stones    History of radiation therapy 08/11/15- 09/23/2015   Right Breast   History of radiation therapy 11/13/17- 12/29/17   Right chest wall and IM nodes 50.04 Gy in 28 fractions, Right supraclavicular and PAB nodes 50.04 Gy in 28 fractions, Right Chest wall boost/ 10 Gy in 5 fractions.    Hyperlipidemia    Hypertension    Monoallelic mutation of NBN gene    Osteoporosis    Pessary maintenance    Pneumonia    Skin cancer of nose     Past Surgical History:  Procedure Laterality Date   APPENDECTOMY     bilateral cataract surgery      BREAST LUMPECTOMY WITH RADIOACTIVE SEED AND SENTINEL LYMPH NODE BIOPSY Right 02/03/2015   Procedure: RIGHT BREAST LUMPECTOMY WITH RADIOACTIVE SEED AND RIGHT SENTINEL LYMPH NODE BIOPSY;  Surgeon: [name], MD;  Location: [facility];  Service: General;  Laterality: Right;   BREAST SURGERY  11/2012  biopsy, benign breast tissue   COLONOSCOPY  06/2010   rec   LITHOTRIPSY     kidney stone on left side   MASTECTOMY W/ SENTINEL NODE BIOPSY Right 03/07/2017   Procedure: RIGHT TOTAL MASTECTOMY WITH RIGHT AXILLARY SENTINEL LYMPH NODE BIOPSY;  Surgeon: [name], MD;  Location: [facility];  Service: General;  Laterality: Right;   OVARIAN CYST REMOVAL  age 78   POLYPECTOMY  04/2005   colon   PORTACATH PLACEMENT Left 02/03/2015   Procedure: INSERTION PORT-A-CATH WITH ULTRA SOUND GUIDANCE;  Surgeon: [name], MD;  Location: [facility];  Service: General;  Laterality: Left;   portacath removal      reexcision  10/09/2017   Dr. Lucia Gaskins, chest wall excision for recurrent breast cancer.    TOTAL KNEE ARTHROPLASTY Right 05/04/2021   Procedure: TOTAL KNEE ARTHROPLASTY;  Surgeon: Paralee Cancel, MD;  Location: [facility];  Service:  Orthopedics;  Laterality: Right;    Current Outpatient Medications  Medication Sig Dispense Refill   acetaminophen (TYLENOL) 650 MG CR tablet Take 650-1,300 mg by mouth every 8 (eight) hours as needed for pain.     Biotin 1000 MCG tablet Take 1,000 mcg by mouth daily.     Calcium Carbonate-Vit D-Min (CALTRATE 600+D PLUS PO) Take 1 tablet by mouth in the morning.     cholecalciferol (VITAMIN D) 1000 units tablet Take 1,000 Units by mouth daily.     CRESTOR 20 MG tablet Take 20 mg by mouth in the morning.  0   estradiol (ESTRACE) 0.1 MG/GM vaginal cream USE 1 GRAM VAGINALLY 2 TIMES A WEEK at bedtime (Patient taking differently: Place 1 Applicatorful vaginally every Sunday.) 42.5 g 1   hydrochlorothiazide (HYDRODIURIL) 12.5 MG tablet Take 12.5 mg by mouth in the morning.     losartan (COZAAR) 100 MG tablet Take 100 mg by mouth in the morning.  0   Polyethyl Glyc-Propyl Glyc PF (SYSTANE PRESERVATIVE FREE) 0.4-0.3 % SOLN Place 1-2 drops into both eyes 3 (three) times daily as needed (dry/irritated eyes).     No current facility-administered medications for this visit.     ALLERGIES: Patient has no known allergies.  Family History  Problem Relation Age of Onset   Colon cancer Sister 62       died at 77   Breast cancer Mother 37   Hypertension Mother    Osteoporosis Mother    Heart disease Father        CABG   Dementia Father    Throat cancer Brother 74   Breast cancer Sister 110   Lymphoma Sister 53   Melanoma Sister    Skin cancer Brother    Skin cancer Daughter     Social History   Socioeconomic History   Marital status: Widowed    Spouse name: Not on file   Number of children: 2   Years of education: Not on file   Highest education level: Not on file  Occupational History   Not on file  Tobacco Use   Smoking status: Never   Smokeless tobacco: Never  Vaping Use   Vaping Use: Never used  Substance and Sexual Activity   Alcohol use: Yes    Comment: rarely   Drug use:  No   Sexual activity: Not Currently    Partners: Male    Birth control/protection: Post-menopausal  Other Topics Concern   Not on file  Social History Narrative   Not on file   Social Determinants of Health  Financial Resource Strain: Not on file  Food Insecurity: Not on file  Transportation Needs: Not on file  Physical Activity: Not on file  Stress: Not on file  Social Connections: Not on file  Intimate Partner Violence: Not on file    ROS  PHYSICAL EXAMINATION:    BP (!) 142/80   Pulse 67   Wt 141 lb (64 kg)   LMP 01/17/1982 (Approximate)   SpO2 99%   BMI 21.44 kg/m     General appearance: alert, cooperative and appears stated age  Pelvic: External genitalia:  no lesions              Urethra:  normal appearing urethra with no masses, tenderness or lesions              Bartholins and Skenes: normal                 Vagina: the pessary was removed and cleaned. No vaginal irritation, + atrophy              Cervix: no lesions               Chaperone was present for exam.  1. Pessary maintenance Doing well F/U in 3 months She will restart her 1 x a week vaginal estrogen  2. Urge incontinence Didn't like ditropan or myrbetriq, didn't really help Offered referral to PT and Urology, she will let me know if she is interested

## 2021-06-22 DIAGNOSIS — M25561 Pain in right knee: Secondary | ICD-10-CM | POA: Diagnosis not present

## 2021-06-23 DIAGNOSIS — Z471 Aftercare following joint replacement surgery: Secondary | ICD-10-CM | POA: Diagnosis not present

## 2021-06-23 DIAGNOSIS — Z96651 Presence of right artificial knee joint: Secondary | ICD-10-CM | POA: Diagnosis not present

## 2021-06-25 DIAGNOSIS — M25561 Pain in right knee: Secondary | ICD-10-CM | POA: Diagnosis not present

## 2021-06-28 DIAGNOSIS — E785 Hyperlipidemia, unspecified: Secondary | ICD-10-CM | POA: Diagnosis not present

## 2021-06-28 DIAGNOSIS — I972 Postmastectomy lymphedema syndrome: Secondary | ICD-10-CM | POA: Diagnosis not present

## 2021-06-28 DIAGNOSIS — N1831 Chronic kidney disease, stage 3a: Secondary | ICD-10-CM | POA: Diagnosis not present

## 2021-06-28 DIAGNOSIS — I10 Essential (primary) hypertension: Secondary | ICD-10-CM | POA: Diagnosis not present

## 2021-06-29 DIAGNOSIS — M25561 Pain in right knee: Secondary | ICD-10-CM | POA: Diagnosis not present

## 2021-06-30 DIAGNOSIS — C50911 Malignant neoplasm of unspecified site of right female breast: Secondary | ICD-10-CM | POA: Diagnosis not present

## 2021-07-02 DIAGNOSIS — M25561 Pain in right knee: Secondary | ICD-10-CM | POA: Diagnosis not present

## 2021-07-05 DIAGNOSIS — D2261 Melanocytic nevi of right upper limb, including shoulder: Secondary | ICD-10-CM | POA: Diagnosis not present

## 2021-07-05 DIAGNOSIS — C44519 Basal cell carcinoma of skin of other part of trunk: Secondary | ICD-10-CM | POA: Diagnosis not present

## 2021-07-05 DIAGNOSIS — D485 Neoplasm of uncertain behavior of skin: Secondary | ICD-10-CM | POA: Diagnosis not present

## 2021-07-05 DIAGNOSIS — Z85828 Personal history of other malignant neoplasm of skin: Secondary | ICD-10-CM | POA: Diagnosis not present

## 2021-07-05 DIAGNOSIS — L821 Other seborrheic keratosis: Secondary | ICD-10-CM | POA: Diagnosis not present

## 2021-07-06 DIAGNOSIS — M25561 Pain in right knee: Secondary | ICD-10-CM | POA: Diagnosis not present

## 2021-07-09 DIAGNOSIS — M25561 Pain in right knee: Secondary | ICD-10-CM | POA: Diagnosis not present

## 2021-07-14 NOTE — Progress Notes (Signed)
Patient Care Team: [name], MD as PCP - General (Internal Medicine) [name], MD as Consulting Physician (General Surgery) [name], MD as Consulting Physician (Hematology and Oncology) Delice Bison Charlestine Massed, NP as Nurse Practitioner (Hematology and Oncology) Salvadore Dom, MD as Consulting Physician (Obstetrics and Gynecology)  DIAGNOSIS:  Encounter Diagnosis  Name Primary?   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 Genes tested include:  ATM, BARD1, BRCA1, BRCA2, BRIP1, CDH1, CHEK2, EPCAM, FANCC, MLH1, MSH2, MSH6, NBN, PALB2, PMS2, PTEN, RAD51C, RAD51D, TP53, and XRCC2.   02/03/2015 Surgery   Right lumpectomy: Invasive ductal carcinoma grade 2, 2.1 cm, with associated DCIS, margins are negative, 0/1 lymph node, ER 0%, PR 0%, HER-2 positive ratio 2.31, T2 N0 stage II a   03/09/2015 - 06/22/2015 Chemotherapy   Adjuvant chemotherapy with [place] 6 followed by Herceptin maintenance for 1 year   08/11/2015 - 09/23/2015 Radiation Therapy   Adjuvant radiation therapy [name]):  1) Right Breast / 50 Gy in 25 fractions ; 2) Right Breast Boost / 10 Gy in 5 fractions   01/30/2017 Relapse/Recurrence   Skin biopsy right breast: Metastatic breast cancer GCDFP strongly positive   02/15/2017 PET scan   Hypermetabolic focus of skin thickening along the medial right breast/chest wall consistent with recurrent disease, no other hypermetabolic  metastases identified.   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 She presents to the clinic today for a follow-up. States that her health is pretty good. She had a knee replacement 3 months ago. She has some severe stabbing continues pain on right side. She had a basal cell on her right side that  her bra strap was irritating it. She did have it removed. She still having swelling on her right arm but she is wearing a compression sleeve. Denies pain and discomfort in breast. Also had a stabbing pain in her right leg and some swelling but could be coming from the knee replacement. She is walking 30 minutes around her neighborhood every morning before the sun comes out for exercising.   ALLERGIES:  has No Known Allergies.  MEDICATIONS:  Current Outpatient Medications  Medication Sig Dispense Refill   acetaminophen (TYLENOL) 650 MG CR tablet Take 650-1,300 mg by mouth every 8 (eight) hours as needed for pain.     Biotin 1000 MCG tablet Take  1,000 mcg by mouth daily.     Calcium Carbonate-Vit D-Min (CALTRATE 600+D PLUS PO) Take 1 tablet by mouth in the morning.     cholecalciferol (VITAMIN D) 1000 units tablet Take 1,000 Units by mouth daily.     CRESTOR 20 MG tablet Take 20 mg by mouth in the morning.  0   estradiol (ESTRACE) 0.1  MG/GM vaginal cream USE 1 GRAM VAGINALLY 2 TIMES A WEEK at bedtime (Patient taking differently: Place 1 Applicatorful vaginally every Sunday.) 42.5 g 1   hydrochlorothiazide (HYDRODIURIL) 12.5 MG tablet Take 12.5 mg by mouth in the morning.     losartan (COZAAR) 100 MG tablet Take 100 mg by mouth in the morning.  0   Polyethyl Glyc-Propyl Glyc PF (SYSTANE PRESERVATIVE FREE) 0.4-0.3 % SOLN Place 1-2 drops into both eyes 3 (three) times daily as needed (dry/irritated eyes).     No current facility-administered medications for this visit.    PHYSICAL EXAMINATION: ECOG PERFORMANCE STATUS: 1 - Symptomatic but completely ambulatory  Vitals:   07/28/21 1122  BP: (!) 149/85  Pulse: 72  Resp: 18  Temp: (!) 97.4 F (36.3 C)  SpO2: 100%   Filed Weights   07/28/21 1122  Weight: 142 lb 3.2 oz (64.5 kg)    BREAST: No palpable masses or nodules in either right or left breasts. No palpable axillary supraclavicular or infraclavicular adenopathy no breast tenderness or nipple discharge. (exam performed in the presence of a chaperone)  LABORATORY DATA:  I have reviewed the data as listed    Latest Ref Rng & Units 05/05/2021    3:46 AM 04/22/2021   11:22 AM 05/14/2020    9:33 AM  CMP  Glucose 70 - 99 mg/dL 138  85  79   BUN 8 - 23 mg/dL 28  28  29   Creatinine 0.44 - 1.00 mg/dL 1.13  1.16  1.10   Sodium 135 - 145 mmol/L 135  138  139   Potassium 3.5 - 5.1 mmol/L 3.8  3.9  3.8   Chloride 98 - 111 mmol/L 106  106  104   CO2 22 - 32 mmol/L 22  25  25   Calcium 8.9 - 10.3 mg/dL 8.1  9.6  9.3   Total Protein 6.5 - 8.1 g/dL  6.8  6.5   Total Bilirubin 0.3 - 1.2 mg/dL  0.5  0.8   Alkaline Phos 38 - 126 U/L  78  83   AST 15 - 41 U/L  25  17   ALT 0 - 44 U/L  23  14     Lab Results  Component Value Date   WBC 8.2 05/05/2021   HGB 11.2 (L) 05/05/2021   HCT 32.8 (L) 05/05/2021   MCV 96.8 05/05/2021   PLT 165 05/05/2021   NEUTROABS 2.9 05/14/2020    ASSESSMENT & PLAN:  Breast cancer of  lower-inner quadrant of right female breast ([facility]) Right lumpectomy 02/03/2015: Invasive ductal carcinoma grade 2, 2.1 cm, with associated DCIS, margins are negative, 0/1 lymph node, ER 0%, PR 0%, HER-2 positive ratio 2.31, T2 N0 stage II a. (Right breast biopsy 3:30 position 12/09/2014: Invasive ductal carcinoma grade 3, ER 0%, PR 0%, HER-2 negative ratio 1.5, Ki-67 90%, 1 cm irregular mass T1C N0 stage IA clinical stage) Genetic counseling revealed NBN mutation   2. Adjuvant chemotherapy with [place] 6 cycles started 03/09/15 completed 06/22/15 took Herceptin maintenance for 1 year until 02/15/2016 3. Followed by radiation therapy 08/11/2015 to 09/22/2015   4. Cutaneous recurrence: 03/07/2017: Right mastectomy: IDC grade 3, 2 foci spanning 1.2 cm and 1.1 cm, lymphovascular invasion is present including dermal lymphatics, margins negative, 0/2 lymph nodes negative, ER 0%, PR 0%, HER-2 negative, Ki-67 not done,pT4b (skin involvement) N0 stage IIIc PET/CT 02/16/2017: No metastatic disease ----------------------------------------------------------------------------------------------- Patient decided not to receive any further systemic chemotherapy. There is no role of antiestrogen therapy because she is triple negative.   Chest wall Recurrence: 10/10/17: Chest wall excision: Breast cancer, ER 0%, PR 0%, HER-2 negative Adjuvant radiation therapy 11/14/2017-12/29/2017 Radiation pneumonitis on CT chest 03/05/2018   Breast cancer surveillance: 03/04/2021: Left breast mammogram: Benign, breast density category C at [facility] 07/28/2021: Breast exam: Benign, right mastectomy scar tissue intact without any lumps or nodules   Right arm lymphedema:  Had undergone physical therapy.  Continues to have slight swelling of the arm.  Uses sleeve.   Return to clinic in 1 year for follow-up    No orders of the defined types were placed in this encounter.  The patient has a good understanding of the overall plan. she agrees  with it. she will call with any problems that may develop before the next visit here. Total time spent: 30 mins including face to face time and time spent for planning, charting and co-ordination of care   Viinay K Gudena, MD 07/28/21    I Deritra, Mcnairy am scribing for Dr. Gudena  I have reviewed the above documentation for accuracy and completeness, and I agree with the above.   

## 2021-07-16 DIAGNOSIS — M25561 Pain in right knee: Secondary | ICD-10-CM | POA: Diagnosis not present

## 2021-07-22 ENCOUNTER — Other Ambulatory Visit: Payer: Self-pay | Admitting: Obstetrics and Gynecology

## 2021-07-22 NOTE — Telephone Encounter (Signed)
Last AEX 11/25/20.  Last mammo 03/01/21- neg birads 1  Last refilled 04/21/21.   Called pt, no answer, left detailed VM per DPR re: the need for refill at this time. If pt is still only doing one gram a week she should still have plenty for months more and she should give [place] a call when she is running low. Asked if pt could call back and let us know if she agreed with this plan.

## 2021-07-22 NOTE — Telephone Encounter (Signed)
Patient called back and said she did not request this refill and does not need it at this time. She asked that we cancel it. At patient's request refill refused.

## 2021-07-28 ENCOUNTER — Inpatient Hospital Stay: Payer: Medicare Other | Attending: Hematology and Oncology | Admitting: Hematology and Oncology

## 2021-07-28 ENCOUNTER — Other Ambulatory Visit: Payer: Self-pay

## 2021-07-28 DIAGNOSIS — Z171 Estrogen receptor negative status [ER-]: Secondary | ICD-10-CM | POA: Diagnosis not present

## 2021-07-28 DIAGNOSIS — Z9221 Personal history of antineoplastic chemotherapy: Secondary | ICD-10-CM | POA: Insufficient documentation

## 2021-07-28 DIAGNOSIS — Z923 Personal history of irradiation: Secondary | ICD-10-CM | POA: Insufficient documentation

## 2021-07-28 DIAGNOSIS — Z853 Personal history of malignant neoplasm of breast: Secondary | ICD-10-CM | POA: Insufficient documentation

## 2021-07-28 DIAGNOSIS — Z9011 Acquired absence of right breast and nipple: Secondary | ICD-10-CM | POA: Diagnosis not present

## 2021-07-28 DIAGNOSIS — C50311 Malignant neoplasm of lower-inner quadrant of right female breast: Secondary | ICD-10-CM

## 2021-07-28 NOTE — Assessment & Plan Note (Signed)
Right lumpectomy 02/03/2015: Invasive ductal carcinoma grade 2, 2.1 cm, with associated DCIS, margins are negative, 0/1 lymph node, ER 0%, PR 0%, HER-2 positiveratio 2.31, T2 N0 stage II a. (Right breast biopsy 3:30 position 12/09/2014: Invasive ductal carcinoma grade 3, ER 0%, PR 0%, HER-2 negativeratio 1.5, Ki-67 90%, 1 cm irregular mass T1C N0 stage IA clinical stage) Genetic counseling revealed NBN mutation  2. Adjuvant chemotherapy with [place] 6 cycles started 03/09/15 completed 06/22/15 tookHerceptin maintenance for 1 year until 02/15/2016 3. Followed by radiation therapy 08/11/2015 to09/05/2015 4. Cutaneous recurrence: 03/07/2017: Right mastectomy: IDC grade 3, 2 foci spanning 1.2 cm and 1.1 cm, lymphovascular invasion is present including dermal lymphatics, margins negative, 0/2 lymph nodes negative, ER 0%, PR 0%, HER-2 negative, Ki-67 not done,pT4b (skin involvement) N0 stage IIIc PET/CT 02/16/2017: No metastatic disease ----------------------------------------------------------------------------------------------- Patient decided not to receive any further systemic chemotherapy. There is no role of antiestrogen therapy because she is triple negative.  Chest wall Recurrence: 10/10/17: Chest wall excision: Breast cancer, ER 0%, PR 0%, HER-2 negative Adjuvant radiation therapy 11/14/2017-12/29/2017 Radiation pneumonitis on CT chest 03/05/2018  Breast cancer surveillance: 03/04/2021:Left breast mammogram: Benign, breast density category C at [facility] 07/28/2021: Breast exam: Benign, right mastectomy scar tissue intact without any lumps or nodules  Right arm lymphedema:  Had undergone physical therapy.  Return to clinic in 1 year for follow-up

## 2021-08-23 DIAGNOSIS — M810 Age-related osteoporosis without current pathological fracture: Secondary | ICD-10-CM | POA: Diagnosis not present

## 2021-09-21 ENCOUNTER — Ambulatory Visit: Payer: Medicare Other | Admitting: Obstetrics and Gynecology

## 2021-09-22 ENCOUNTER — Ambulatory Visit: Payer: Medicare Other | Admitting: Obstetrics and Gynecology

## 2021-09-22 ENCOUNTER — Encounter: Payer: Self-pay | Admitting: Obstetrics and Gynecology

## 2021-09-22 VITALS — BP 118/80 | HR 66 | Wt 147.0 lb

## 2021-09-22 DIAGNOSIS — N3941 Urge incontinence: Secondary | ICD-10-CM | POA: Diagnosis not present

## 2021-09-22 DIAGNOSIS — Z4689 Encounter for fitting and adjustment of other specified devices: Secondary | ICD-10-CM

## 2021-09-22 MED ORDER — TROSPIUM CHLORIDE 20 MG PO TABS: 20.0000 mg | ORAL_TABLET | Freq: Two times a day (BID) | ORAL | 1 refills | Status: DC

## 2021-09-22 NOTE — Progress Notes (Signed)
GYNECOLOGY  VISIT   HPI: 81 y.o.   Widowed White or Caucasian Not Hispanic or Latino  female   [phone] with Patient's last menstrual period was 01/17/1982 (approximate).   here for a pessary check. She uses a #3 cube pessary. She uses vaginal estrogen 1 x a week. Doing well, no bleeding.   She has tried myrbetriq and ditropan in the past for OAB, interested in trying something again. She hasn't done pelvic floor PT in the past.   GYNECOLOGIC HISTORY: Patient's last menstrual period was 01/17/1982 (approximate). Contraception:PMP Menopausal hormone therapy: None, uses estrogen cream.        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, right ([place]) 02/24/2017   Anemia 11/03/2015   Edema 95/28/4132   Monoallelic mutation of NBN gene    Genetic testing 01/13/2015   Family history of breast cancer    Family history of colon cancer    Breast cancer of lower-inner quadrant of right female breast ([place]) 12/30/2014   Basal cell carcinoma of skin 10/30/2014   Abnormal results of liver function studies 10/30/2014   Callosity 10/30/2014   Age-related osteoporosis without current pathological fracture 07/31/2008   Essential hypertension 07/31/2008    Past Medical History:  Diagnosis Date   Arthritis    Breast cancer ([place]) 11/2014   Family history of breast cancer    Family history of colon cancer    History of kidney stones    History of radiation therapy 08/11/15- 09/23/2015   Right Breast   History of radiation therapy  11/13/17- 12/29/17   Right chest wall and IM nodes 50.04 Gy in 28 fractions, Right supraclavicular and PAB nodes 50.04 Gy in 28 fractions, Right Chest wall boost/ 10 Gy in 5 fractions.    Hyperlipidemia    Hypertension    Monoallelic mutation of NBN gene    Osteoporosis    Pessary maintenance    Pneumonia    Skin cancer of nose     Past Surgical History:  Procedure Laterality Date   APPENDECTOMY     bilateral cataract surgery      BREAST LUMPECTOMY WITH RADIOACTIVE SEED AND SENTINEL LYMPH NODE BIOPSY Right 02/03/2015   Procedure: RIGHT BREAST LUMPECTOMY WITH RADIOACTIVE SEED AND RIGHT SENTINEL LYMPH NODE BIOPSY;  Surgeon: [name], MD;  Location: [facility];  Service: General;  Laterality: Right;   BREAST SURGERY  11/2012   biopsy, benign breast tissue   COLONOSCOPY  06/2010   rec   LITHOTRIPSY     kidney stone on left side   MASTECTOMY W/ SENTINEL NODE BIOPSY Right 03/07/2017   Procedure: RIGHT TOTAL MASTECTOMY WITH RIGHT AXILLARY SENTINEL LYMPH NODE BIOPSY;  Surgeon: [name], MD;  Location: [facility];  Service: General;  Laterality: Right;   OVARIAN CYST REMOVAL  age 81   POLYPECTOMY  04/2005   colon   PORTACATH PLACEMENT Left 02/03/2015   Procedure: INSERTION PORT-A-CATH WITH ULTRA SOUND GUIDANCE;  Surgeon: [name], MD;  Location: [facility];  Service: General;  Laterality: Left;   portacath removal      reexcision  10/09/2017   Dr. Lucia Gaskins, chest wall excision for recurrent breast cancer.    TOTAL KNEE ARTHROPLASTY Right 05/04/2021   Procedure: TOTAL KNEE ARTHROPLASTY;  Surgeon: Paralee Cancel, MD;  Location: [facility];  Service: Orthopedics;  Laterality: Right;    Current Outpatient Medications  Medication Sig Dispense Refill   acetaminophen (TYLENOL) 650 MG CR tablet Take 650-1,300 mg by mouth every 8 (eight) hours as needed for pain.     Biotin 1000 MCG tablet Take 1,000 mcg by mouth daily.     Calcium Carbonate-Vit D-Min (CALTRATE  600+D PLUS PO) Take 1 tablet by mouth in the morning.     cholecalciferol (VITAMIN D) 1000 units tablet Take 1,000 Units by mouth daily.     CRESTOR 20 MG tablet Take 20 mg by mouth in the morning.  0   estradiol (ESTRACE) 0.1 MG/GM vaginal cream USE 1 GRAM VAGINALLY 2 TIMES A WEEK at bedtime (Patient taking differently: Place 1 Applicatorful vaginally every Sunday.) 42.5 g 1   hydrochlorothiazide (HYDRODIURIL) 12.5 MG tablet Take 12.5 mg by mouth in the morning.     losartan (COZAAR) 100 MG tablet Take 100 mg by mouth in the morning.  0   Polyethyl Glyc-Propyl Glyc PF (SYSTANE PRESERVATIVE FREE) 0.4-0.3 % SOLN Place 1-2 drops into both eyes 3 (three) times daily as needed (dry/irritated eyes).     No current facility-administered medications for this visit.     ALLERGIES: Patient has no known allergies.  Family History  Problem Relation Age of Onset   Colon cancer Sister 52       died at 80   Breast cancer Mother 81   Hypertension Mother    Osteoporosis Mother    Heart disease Father        CABG   Dementia Father    Throat cancer Brother 63   Breast cancer Sister 60   Lymphoma Sister 91   Melanoma Sister    Skin cancer Brother    Skin cancer Daughter     Social History   Socioeconomic History   Marital status: Widowed    Spouse name: Not on file   Number of children: 2   Years of education: Not on file   Highest education level: Not on file  Occupational History   Not on file  Tobacco Use   Smoking status: Never   Smokeless tobacco: Never  Vaping Use   Vaping Use: Never used  Substance and Sexual Activity   Alcohol use: Yes    Comment: rarely   Drug use: No   Sexual activity: Not Currently    Partners: Male    Birth control/protection: Post-menopausal  Other Topics Concern   Not on file  Social History Narrative   Not on file   Social Determinants of Health   Financial Resource Strain: Not on file  Food Insecurity: Not on file  Transportation Needs: No  Transportation Needs (11/07/2017)   PRAPARE - Hydrologist (Medical): No    Lack of Transportation (Non-Medical): No  Physical Activity: Not on file  Stress: Not on file  Social Connections: Not on file  Intimate Partner Violence: Unknown (09/25/2018)   Humiliation, Afraid, Rape, and Kick questionnaire    Fear of Current or Ex-Partner: Not asked    Emotionally Abused: Not asked    Physically Abused: Not asked    Sexually Abused: Not asked    ROS  PHYSICAL EXAMINATION:    LMP 01/17/1982 (Approximate)     General appearance: alert, cooperative and appears stated age  Pelvic: External genitalia:  no lesions              Urethra:  normal appearing urethra with no masses, tenderness or lesions              Bartholins and Skenes: normal                 Vagina: the pessary was removed and cleaned, she has mild irritation in the upper right/posterior vagina. The pessary was left out.               Cervix: no lesions             Chaperone was present for exam.  1. Pessary maintenance Overall does well with the cube pessary, currently with mild irritation. -pessary left out -increase vaginal estrogen use to 3 x a week -f/u on 9/18, she will bring her pessary and vaginal estrogen with her  2. Urge incontinence Didn't like ditropan or myrbetriq. Will try new medication (okay with her current GFR).  - trospium (SANCTURA) 20 MG tablet; Take 1 tablet (20 mg total) by mouth 2 (two) times daily.  Dispense: 60 tablet; Refill: 1 -Consider pelvic floor PT

## 2021-09-23 ENCOUNTER — Other Ambulatory Visit: Payer: Self-pay

## 2021-09-23 DIAGNOSIS — N3941 Urge incontinence: Secondary | ICD-10-CM

## 2021-09-23 NOTE — Telephone Encounter (Signed)
Rx sent yesterday for #60 w/ 1 refill. Request sent from pharmacy for 90 day supply.   Last AEX 11/25/2020--nothing schedule for annual.  Has OV scheduled for 10/04/21 for pessary f/u.

## 2021-09-23 NOTE — Telephone Encounter (Signed)
She needs to try the medication and tolerate it prior to me sending a 90 day script.

## 2021-10-04 ENCOUNTER — Encounter: Payer: Self-pay | Admitting: Obstetrics and Gynecology

## 2021-10-04 ENCOUNTER — Ambulatory Visit: Payer: Medicare Other | Admitting: Obstetrics and Gynecology

## 2021-10-04 VITALS — BP 118/74 | HR 72 | Wt 140.0 lb

## 2021-10-04 DIAGNOSIS — N3941 Urge incontinence: Secondary | ICD-10-CM

## 2021-10-04 DIAGNOSIS — Z4689 Encounter for fitting and adjustment of other specified devices: Secondary | ICD-10-CM

## 2021-10-04 NOTE — Progress Notes (Signed)
GYNECOLOGY  VISIT   HPI: 81 y.o.   Widowed White or Caucasian Not Hispanic or Latino  female   [phone] with Patient's last menstrual period was 01/17/1982 (approximate).   here for pessary follow up. She has a #3 cube pessary and uses vaginal estrogen 1 x a week. At her visit 2 weeks ago she was noted to have mild vaginal irritation. The pessary was left out and she was instructed to increase her estrogen to 3 x a week. Has had to reduce her bladder to void.  H/O OAB, 2 weeks ago she was started on Trospium. Prior to starting it she had frequent urination and was leaking urine many times over 24 hours. Thinks she is ~30% better since starting the medication. No significant side effects.   GYNECOLOGIC HISTORY: Patient's last menstrual period was 01/17/1982 (approximate). 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, right ([place]) 02/24/2017   Anemia 11/03/2015   Edema 44/31/5400   Monoallelic mutation of NBN gene    Genetic testing 01/13/2015   Family history of breast cancer    Family history of colon cancer    Breast cancer of lower-inner quadrant of right female breast ([place]) 12/30/2014   Basal cell carcinoma of skin 10/30/2014   Abnormal results of liver function studies 10/30/2014   Callosity 10/30/2014   Age-related osteoporosis without current pathological fracture 07/31/2008   Essential hypertension 07/31/2008    Past Medical History:  Diagnosis Date    Arthritis    Breast cancer ([place]) 11/2014   Family history of breast cancer    Family history of colon cancer    History of kidney stones    History of radiation therapy 08/11/15- 09/23/2015   Right Breast   History of radiation therapy 11/13/17- 12/29/17   Right chest wall and IM nodes 50.04 Gy in 28 fractions, Right supraclavicular and PAB nodes 50.04 Gy in 28 fractions, Right Chest wall boost/ 10 Gy in 5 fractions.    Hyperlipidemia    Hypertension    Monoallelic mutation of NBN gene    Osteoporosis    Pessary maintenance    Pneumonia    Skin cancer of nose     Past Surgical History:  Procedure Laterality Date   APPENDECTOMY     bilateral cataract surgery      BREAST LUMPECTOMY WITH RADIOACTIVE SEED AND SENTINEL LYMPH NODE BIOPSY Right 02/03/2015   Procedure: RIGHT BREAST LUMPECTOMY WITH RADIOACTIVE SEED AND RIGHT SENTINEL LYMPH NODE BIOPSY;  Surgeon: [name], MD;  Location: [facility];  Service: General;  Laterality: Right;   BREAST SURGERY  11/2012   biopsy, benign breast tissue   COLONOSCOPY  06/2010  rec   LITHOTRIPSY     kidney stone on left side   MASTECTOMY W/ SENTINEL NODE BIOPSY Right 03/07/2017   Procedure: RIGHT TOTAL MASTECTOMY WITH RIGHT AXILLARY SENTINEL LYMPH NODE BIOPSY;  Surgeon: [name], MD;  Location: [facility];  Service: General;  Laterality: Right;   OVARIAN CYST REMOVAL  age 61   POLYPECTOMY  04/2005   colon   PORTACATH PLACEMENT Left 02/03/2015   Procedure: INSERTION PORT-A-CATH WITH ULTRA SOUND GUIDANCE;  Surgeon: [name], MD;  Location: [facility];  Service: General;  Laterality: Left;   portacath removal      reexcision  10/09/2017   Dr. Lucia Gaskins, chest wall excision for recurrent breast cancer.    TOTAL KNEE ARTHROPLASTY Right 05/04/2021   Procedure: TOTAL KNEE ARTHROPLASTY;  Surgeon: Paralee Cancel, MD;  [facility];  Service: Orthopedics;  Laterality: Right;    Current Outpatient Medications   Medication Sig Dispense Refill   acetaminophen (TYLENOL) 650 MG CR tablet Take 650-1,300 mg by mouth every 8 (eight) hours as needed for pain.     Biotin 1000 MCG tablet Take 1,000 mcg by mouth daily.     Calcium Carbonate-Vit D-Min (CALTRATE 600+D PLUS PO) Take 1 tablet by mouth in the morning.     cholecalciferol (VITAMIN D) 1000 units tablet Take 1,000 Units by mouth daily.     CRESTOR 20 MG tablet Take 20 mg by mouth in the morning.  0   estradiol (ESTRACE) 0.1 MG/GM vaginal cream USE 1 GRAM VAGINALLY 2 TIMES A WEEK at bedtime (Patient taking differently: Place 1 Applicatorful vaginally every Sunday.) 42.5 g 1   hydrochlorothiazide (HYDRODIURIL) 12.5 MG tablet Take 12.5 mg by mouth in the morning.     losartan (COZAAR) 100 MG tablet Take 100 mg by mouth in the morning.  0   Polyethyl Glyc-Propyl Glyc PF (SYSTANE PRESERVATIVE FREE) 0.4-0.3 % SOLN Place 1-2 drops into both eyes 3 (three) times daily as needed (dry/irritated eyes).     trospium (SANCTURA) 20 MG tablet Take 1 tablet (20 mg total) by mouth 2 (two) times daily. 60 tablet 1   No current facility-administered medications for this visit.     ALLERGIES: Patient has no known allergies.  Family History  Problem Relation Age of Onset   Colon cancer Sister 63       died at 67   Breast cancer Mother 79   Hypertension Mother    Osteoporosis Mother    Heart disease Father        CABG   Dementia Father    Throat cancer Brother 34   Breast cancer Sister 24   Lymphoma Sister 38   Melanoma Sister    Skin cancer Brother    Skin cancer Daughter     Social History   Socioeconomic History   Marital status: Widowed    Spouse name: Not on file   Number of children: 2   Years of education: Not on file   Highest education level: Not on file  Occupational History   Not on file  Tobacco Use   Smoking status: Never   Smokeless tobacco: Never  Vaping Use   Vaping Use: Never used  Substance and Sexual Activity   Alcohol use:  Yes    Comment: rarely   Drug use: No   Sexual activity: Not Currently    Partners: Male    Birth control/protection: Post-menopausal  Other Topics Concern   Not on file  Social History Narrative  Not on file   Social Determinants of Health   Financial Resource Strain: Not on file  Food Insecurity: Not on file  Transportation Needs: No Transportation Needs (11/07/2017)   PRAPARE - Hydrologist (Medical): No    Lack of Transportation (Non-Medical): No  Physical Activity: Not on file  Stress: Not on file  Social Connections: Not on file  Intimate Partner Violence: Unknown (09/25/2018)   Humiliation, Afraid, Rape, and Kick questionnaire    Fear of Current or Ex-Partner: Not asked    Emotionally Abused: Not asked    Physically Abused: Not asked    Sexually Abused: Not asked    Review of Systems  All other systems reviewed and are negative.   PHYSICAL EXAMINATION:    BP 118/74   Pulse 72   Wt 140 lb (63.5 kg)   LMP 01/17/1982 (Approximate)   SpO2 99%   BMI 21.29 kg/m     General appearance: alert, cooperative and appears stated age  Pelvic: External genitalia:  no lesions              Urethra:  normal appearing urethra with no masses, tenderness or lesions              Bartholins and Skenes: normal                 Vagina: normal appearing vagina with normal color and discharge, no lesions. 1 gram of estrace cream placed vaginally and the cube pessary was placed.               Cervix: no lesions and slight irritation.                Chaperone was present for exam.  1. Pessary maintenance Vaginal irritation has healed. 1 gram estrace placed and pessary replaced. -She will change to using the estrogen 1 x a week -F/U in one month  2. Urge incontinence She started on trospium a few weeks ago, feels about 30% better. Now that her pessary is back in, hopefully that will help as well. -She will continue on the medication for now and f/u in  one month -GFR was 49 in 4/23. I would prefer not to increase her dose.  -PT is also an option

## 2021-10-20 ENCOUNTER — Other Ambulatory Visit: Payer: Self-pay | Admitting: Obstetrics and Gynecology

## 2021-10-20 DIAGNOSIS — N3941 Urge incontinence: Secondary | ICD-10-CM

## 2021-10-28 ENCOUNTER — Telehealth: Payer: Self-pay | Admitting: *Deleted

## 2021-10-28 ENCOUNTER — Other Ambulatory Visit: Payer: Self-pay | Admitting: Obstetrics and Gynecology

## 2021-10-28 ENCOUNTER — Encounter: Payer: Self-pay | Admitting: Obstetrics and Gynecology

## 2021-10-28 ENCOUNTER — Ambulatory Visit: Payer: Medicare Other | Admitting: Obstetrics and Gynecology

## 2021-10-28 DIAGNOSIS — N898 Other specified noninflammatory disorders of vagina: Secondary | ICD-10-CM | POA: Diagnosis not present

## 2021-10-28 DIAGNOSIS — N3941 Urge incontinence: Secondary | ICD-10-CM | POA: Diagnosis not present

## 2021-10-28 DIAGNOSIS — N760 Acute vaginitis: Secondary | ICD-10-CM | POA: Diagnosis not present

## 2021-10-28 DIAGNOSIS — B3731 Acute candidiasis of vulva and vagina: Secondary | ICD-10-CM

## 2021-10-28 DIAGNOSIS — N8111 Cystocele, midline: Secondary | ICD-10-CM | POA: Diagnosis not present

## 2021-10-28 DIAGNOSIS — N814 Uterovaginal prolapse, unspecified: Secondary | ICD-10-CM

## 2021-10-28 DIAGNOSIS — Z4689 Encounter for fitting and adjustment of other specified devices: Secondary | ICD-10-CM

## 2021-10-28 LAB — WET PREP FOR TRICH, YEAST, CLUE

## 2021-10-28 MED ORDER — METRONIDAZOLE 500 MG PO TABS: 500.0000 mg | ORAL_TABLET | Freq: Two times a day (BID) | ORAL | 0 refills | Status: DC

## 2021-10-28 MED ORDER — FLUCONAZOLE 150 MG PO TABS
150.0000 mg | ORAL_TABLET | Freq: Once | ORAL | 0 refills | Status: AC
Start: 2021-10-28 — End: 2021-10-28

## 2021-10-28 NOTE — Telephone Encounter (Signed)
-----   Message from Salvadore Dom, MD sent at 10/28/2021 12:04 PM EDT ----- I've placed a referral to Urology. Thanks, Sharee Pimple

## 2021-10-28 NOTE — Progress Notes (Signed)
GYNECOLOGY  VISIT   HPI: 81 y.o.   Widowed White or Caucasian Not Hispanic or Latino  female   [phone] with Patient's last menstrual period was 01/17/1982 (approximate).   here for a 4 week medication follow up after starting trospium for OAB.  The leakage is better, but she is feeling a little dizzy. She is only taking one tablet a day.  She previously didn't tolerate ditropan or myrbetriq.    She also states that she is prolapsing in front of her pessary since her last visit. She has a #3 cube. Used vaginal estrogen ~1 x a week.   GYNECOLOGIC HISTORY: Patient's last menstrual period was 01/17/1982 (approximate). 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, right ([place]) 02/24/2017   Anemia 11/03/2015   Edema 60/73/7106   Monoallelic mutation of NBN gene    Genetic testing 01/13/2015   Family history of breast cancer    Family history of colon cancer    Breast cancer of lower-inner quadrant of right female breast ([place]) 12/30/2014   Basal cell carcinoma of skin 10/30/2014   Abnormal results of liver function studies 10/30/2014   Callosity 10/30/2014   Age-related osteoporosis without current pathological fracture 07/31/2008   Essential hypertension 07/31/2008    Past Medical History:  Diagnosis Date   Arthritis    Breast cancer ([place]) 11/2014   Family history of breast cancer    Family history of colon cancer    History of kidney stones    History of  radiation therapy 08/11/15- 09/23/2015   Right Breast   History of radiation therapy 11/13/17- 12/29/17   Right chest wall and IM nodes 50.04 Gy in 28 fractions, Right supraclavicular and PAB nodes 50.04 Gy in 28 fractions, Right Chest wall boost/ 10 Gy in 5 fractions.    Hyperlipidemia    Hypertension    Monoallelic mutation of NBN gene    Osteoporosis    Pessary maintenance    Pneumonia    Skin cancer of nose     Past Surgical History:  Procedure Laterality Date   APPENDECTOMY     bilateral cataract surgery      BREAST LUMPECTOMY WITH RADIOACTIVE SEED AND SENTINEL LYMPH NODE BIOPSY Right 02/03/2015   Procedure: RIGHT BREAST LUMPECTOMY WITH RADIOACTIVE SEED AND RIGHT SENTINEL LYMPH NODE BIOPSY;  Surgeon: [name], MD;  Location: [facility];  Service: General;  Laterality: Right;   BREAST SURGERY  11/2012   biopsy, benign breast tissue   COLONOSCOPY  06/2010   rec   LITHOTRIPSY     kidney stone on left side   MASTECTOMY W/ SENTINEL NODE BIOPSY Right 03/07/2017   Procedure: RIGHT TOTAL MASTECTOMY WITH  RIGHT AXILLARY SENTINEL LYMPH NODE BIOPSY;  Surgeon: [name], MD;  Location: [facility];  Service: General;  Laterality: Right;   OVARIAN CYST REMOVAL  age 69   POLYPECTOMY  04/2005   colon   PORTACATH PLACEMENT Left 02/03/2015   Procedure: INSERTION PORT-A-CATH WITH ULTRA SOUND GUIDANCE;  Surgeon: [name], MD;  Location: [facility];  Service: General;  Laterality: Left;   portacath removal      reexcision  10/09/2017   Dr. Lucia Gaskins, chest wall excision for recurrent breast cancer.    TOTAL KNEE ARTHROPLASTY Right 05/04/2021   Procedure: TOTAL KNEE ARTHROPLASTY;  Surgeon: Paralee Cancel, MD;  [facility];  Service: Orthopedics;  Laterality: Right;    Current Outpatient Medications  Medication Sig Dispense Refill   trospium (SANCTURA) 20 MG tablet Take 1 tablet (20 mg total) by mouth 2 (two) times daily. 60 tablet 1   acetaminophen (TYLENOL)  650 MG CR tablet Take 650-1,300 mg by mouth every 8 (eight) hours as needed for pain.     Calcium Carbonate-Vit D-Min (CALTRATE 600+D PLUS PO) Take 1 tablet by mouth in the morning.     cholecalciferol (VITAMIN D) 1000 units tablet Take 1,000 Units by mouth daily.     CRESTOR 20 MG tablet Take 20 mg by mouth in the morning.  0   estradiol (ESTRACE) 0.1 MG/GM vaginal cream USE 1 GRAM VAGINALLY 2 TIMES A WEEK at bedtime (Patient taking differently: Place 1 Applicatorful vaginally every Sunday.) 42.5 g 1   hydrochlorothiazide (HYDRODIURIL) 12.5 MG tablet Take 12.5 mg by mouth in the morning.     losartan (COZAAR) 100 MG tablet Take 100 mg by mouth in the morning.  0   Polyethyl Glyc-Propyl Glyc PF (SYSTANE PRESERVATIVE FREE) 0.4-0.3 % SOLN Place 1-2 drops into both eyes 3 (three) times daily as needed (dry/irritated eyes).     No current facility-administered medications for this visit.     ALLERGIES: Patient has no known allergies.  Family History  Problem Relation Age of Onset   Colon cancer Sister 68       died at 70   Breast cancer Mother 59   Hypertension Mother    Osteoporosis Mother    Heart disease Father        CABG   Dementia Father    Throat cancer Brother 59   Breast cancer Sister 42   Lymphoma Sister 40   Melanoma Sister    Skin cancer Brother    Skin cancer Daughter     Social History   Socioeconomic History   Marital status: Widowed    Spouse name: Not on file   Number of children: 2   Years of education: Not on file   Highest education level: Not on file  Occupational History   Not on file  Tobacco Use   Smoking status: Never   Smokeless tobacco: Never  Vaping Use   Vaping Use: Never used  Substance and Sexual Activity   Alcohol use: Yes    Comment: rarely   Drug use: No   Sexual activity: Not Currently    Partners: Male    Birth control/protection: Post-menopausal  Other Topics Concern   Not on file  Social History Narrative   Not on file    Social Determinants of Health   Financial Resource Strain: Not on file  Food Insecurity: Not on file  Transportation Needs: No Transportation Needs (11/07/2017)   PRAPARE - Transportation    Lack of Transportation (  Medical): No    Lack of Transportation (Non-Medical): No  Physical Activity: Not on file  Stress: Not on file  Social Connections: Not on file  Intimate Partner Violence: Unknown (09/25/2018)   Humiliation, Afraid, Rape, and Kick questionnaire    Fear of Current or Ex-Partner: Not asked    Emotionally Abused: Not asked    Physically Abused: Not asked    Sexually Abused: Not asked    Review of Systems  All other systems reviewed and are negative.   PHYSICAL EXAMINATION:    LMP 01/17/1982 (Approximate)     General appearance: alert, cooperative and appears stated age  Pelvic: External genitalia:  no lesions              Urethra:  normal appearing urethra with no masses, tenderness or lesions              Bartholins and Skenes: normal                 Vagina: With valsalva her cystocele is prolapsing in front of the pessary. The pessary was removed. She has irritation on her right vaginal apex. There is a thick, clumpy, white vaginal discharge.               Cervix:  irritated and friable appearing              Fitted with a #4 cube pessary, very uncomfortable with insertion, still prolapsed in front of it.  Fitted with a #6 ring, too big  Fitted with a # 3 gellhorn, difficult to get in and out, felt a little large, but comfortable when she was walking  Fitted with a #2 3/4 inch gellhorn  Chaperone was present for exam.  1. Uterine prolapse Originally controlled with a ring pessary, then a cube pessary. Now prolapsing infront of the cube. Fitted with a #2 3/4 inch gellhorn. Not left in secondary to vaginal irritation and infection. She will return in ~2 weeks for insertion. She will use vaginal estrogen qod until then. Will treat her vaginitis  2. Midline  cystocele  3. Pessary maintenance New pessary fitting today  4. Vaginal irritation Pessary left out  5. Vaginal discharge - WET PREP FOR TRICH, YEAST, CLUE  6. Yeast vaginitis Given GFR of 49, will space out treatment with diflucan - fluconazole (DIFLUCAN) 150 MG tablet; Take 1 tablet (150 mg total) by mouth once for 1 dose. Take one tablet.  Repeat x 1 in one week  Dispense: 2 tablet; Refill: 0  7. BV (bacterial vaginosis) No adjustment needed based on her GFR - metroNIDAZOLE (FLAGYL) 500 MG tablet; Take 1 tablet (500 mg total) by mouth 2 (two) times daily.  Dispense: 14 tablet; Refill: 0  8. Urge incontinence - Ambulatory referral to Urology

## 2021-10-28 NOTE — Telephone Encounter (Signed)
Referral faxed to [facility] they will call to schedule.

## 2021-10-28 NOTE — Telephone Encounter (Signed)
Last annual exam was 11/25/2020 No future exam scheduled  Has follow up visit pessary check on 11/09/21

## 2021-10-28 NOTE — Patient Instructions (Signed)
Use the vaginal estrogen cream every other night until your f/u appointment in ~2 weeks

## 2021-11-02 IMAGING — CT CT ANGIO CHEST
1 of 2 series · 19 of 32 positions shown · IV contrast (APPLIED)
Comparison: November 01, 2018.

CLINICAL DATA: Shortness of breath.

EXAM:
CT ANGIOGRAPHY CHEST WITH CONTRAST
TECHNIQUE: Multidetector CT imaging of the chest was performed using the
standard protocol during bolus administration of intravenous
contrast. Multiplanar CT image reconstructions and MIPs were
obtained to evaluate the vascular anatomy.
CONTRAST:  60mL 3ZP89G-Z3F IOPAMIDOL (3ZP89G-Z3F) INJECTION 76%

[Series 5: thins 1.0 b31s · axial · 0.60mm/px · z∈[-285,-7]mm · 19 of 310 slices shown]
[im 16/310  lung]
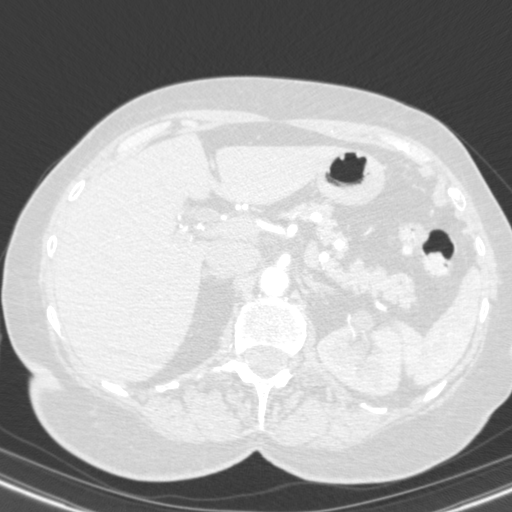
[im 31/310  mediastinal]
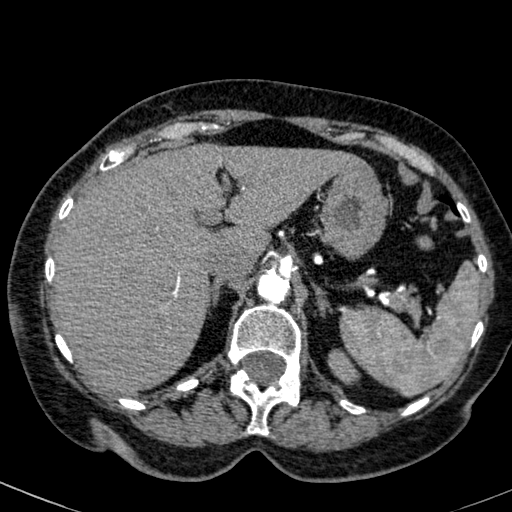
[im 47/310  lung]
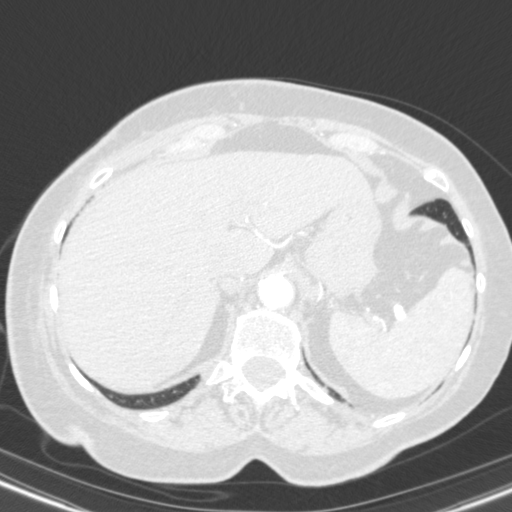
[im 78/310  mediastinal]
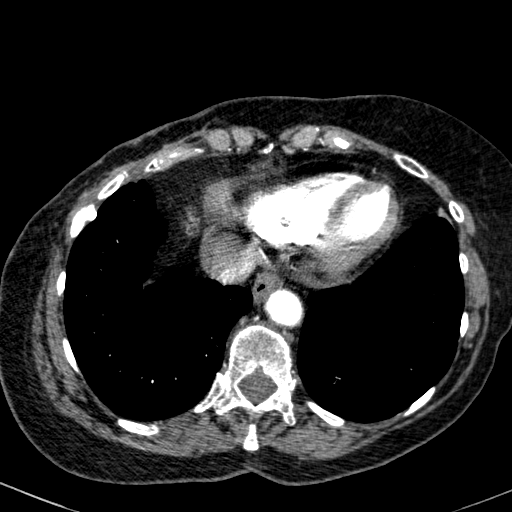
[im 93/310  lung]
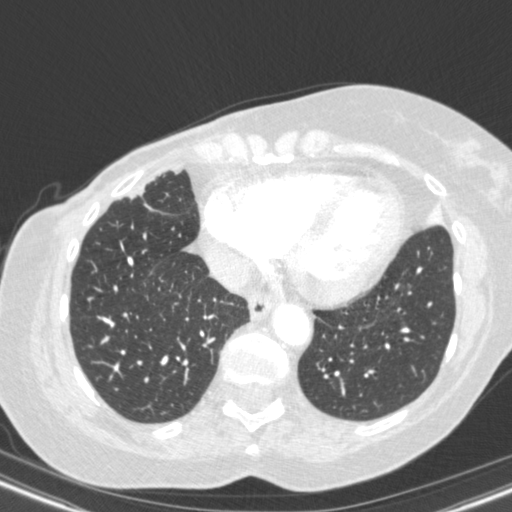
[im 104/310  mediastinal]
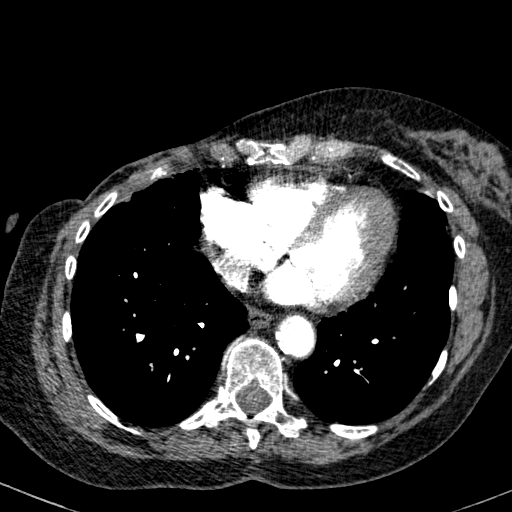
[im 109/310  lung]
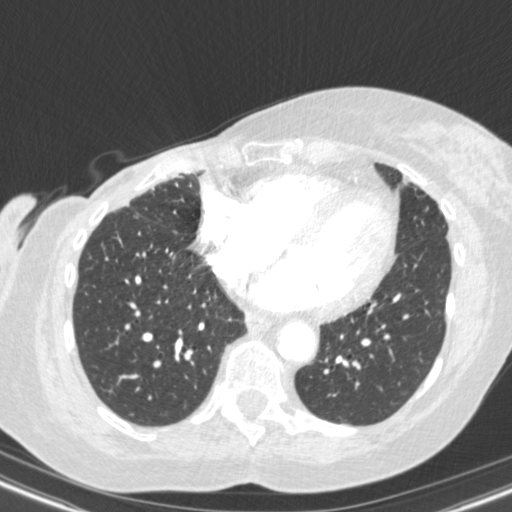
[im 124/310  mediastinal]
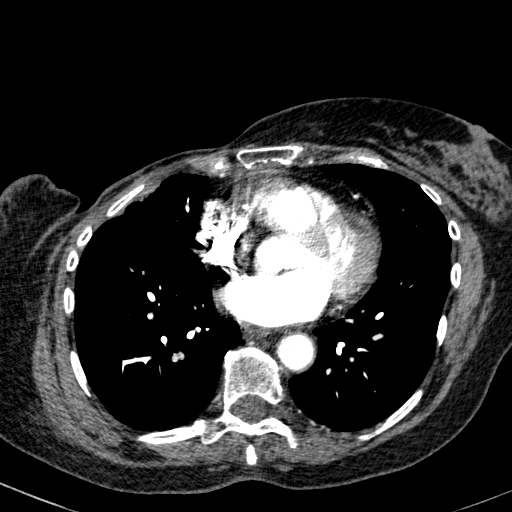
[im 140/310  lung]
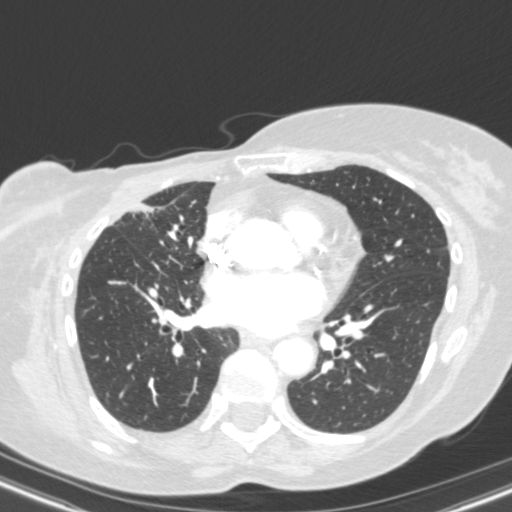
[im 155/310  mediastinal]
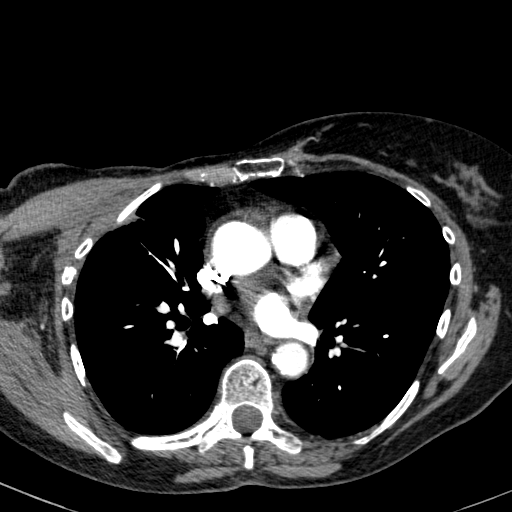
[im 170/310  lung]
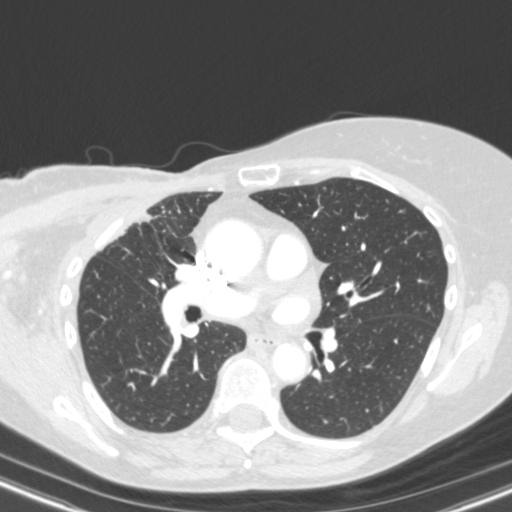
[im 186/310  mediastinal]
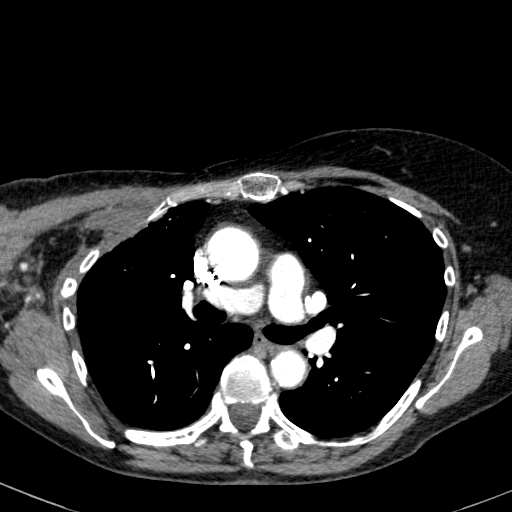
[im 201/310  lung]
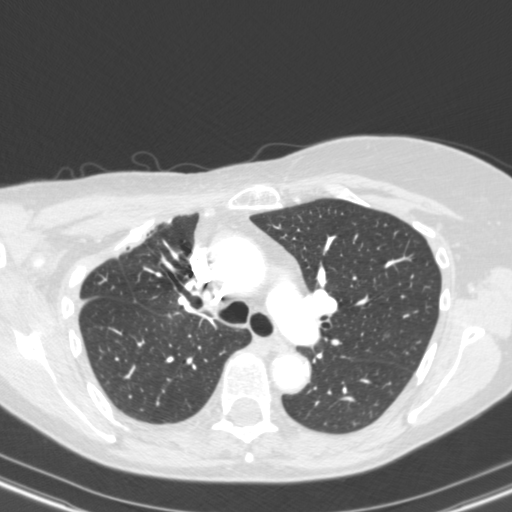
[im 207/310  mediastinal]
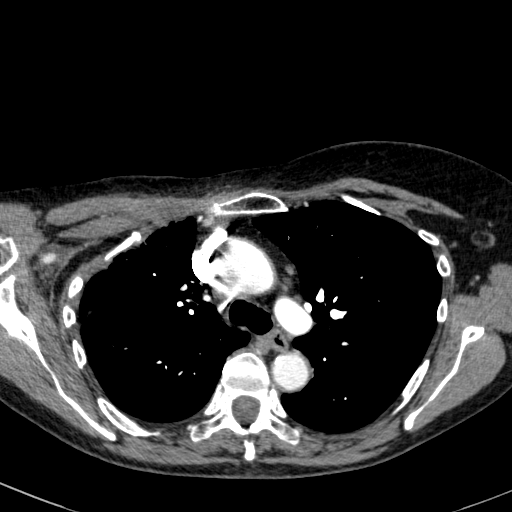
[im 217/310  lung]
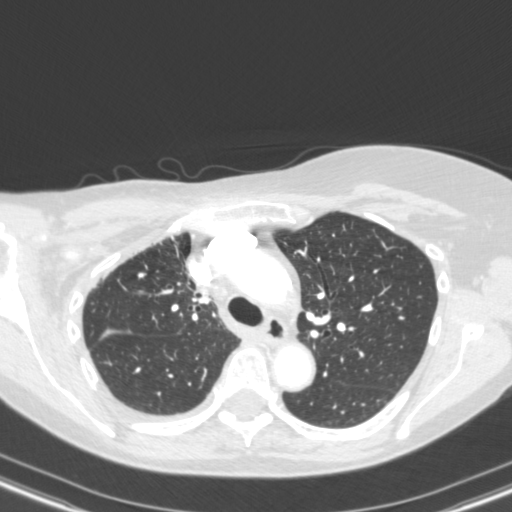
[im 232/310  mediastinal]
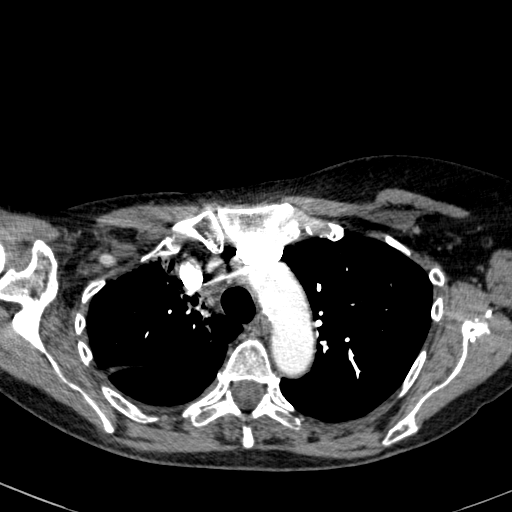
[im 263/310  lung]
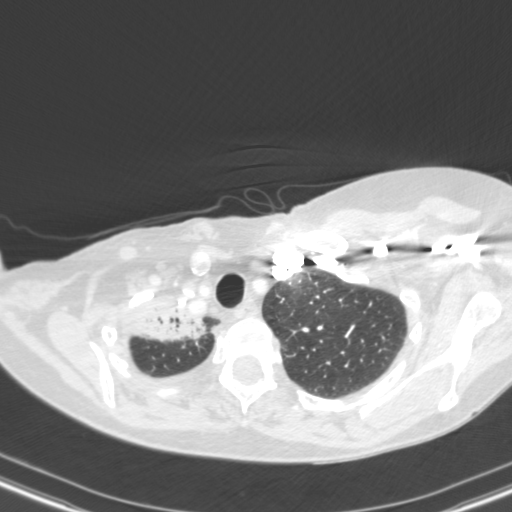
[im 279/310  mediastinal]
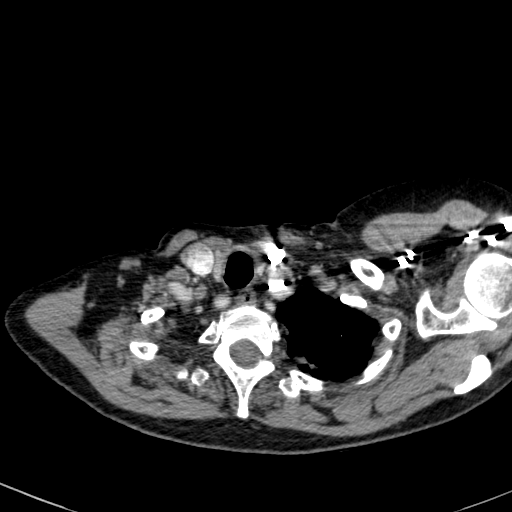
[im 294/310  lung]
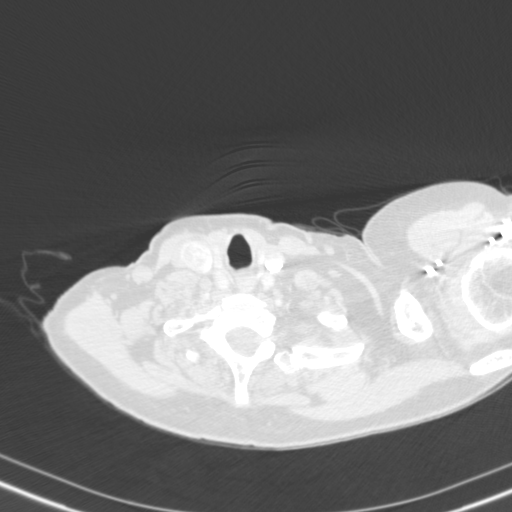

[19 of 32 positions shown; findings below may reference images not displayed]

FINDINGS: Cardiovascular: Satisfactory opacification of the pulmonary arteries
to the segmental level. No evidence of pulmonary embolism. Normal
heart size. No pericardial effusion. Atherosclerosis of thoracic
aorta is noted without aneurysm formation.

Mediastinum/Nodes: No enlarged mediastinal, hilar, or axillary lymph
nodes. Thyroid gland, trachea, and esophagus demonstrate no
significant findings.

Lungs/Pleura: No pneumothorax or pleural effusion is noted. Stable
probable fibrosis is noted in the right upper lobe. Stable right
middle lobe scarring is noted. Stable irregular nodular density is
noted in subpleural location of left upper lobe most consistent with
scarring. No acute abnormalities noted.

Upper Abdomen: No acute abnormality.

Musculoskeletal: No chest wall abnormality. No acute or significant
osseous findings.

Review of the MIP images confirms the above findings.
IMPRESSION: No definite evidence of pulmonary embolus.

Stable probable fibrosis is noted in the right upper lobe.

No definite acute abnormality seen in the chest.

Aortic Atherosclerosis ([id number]-N18.8).

## 2021-11-09 ENCOUNTER — Ambulatory Visit: Payer: Medicare Other | Admitting: Obstetrics and Gynecology

## 2021-11-09 ENCOUNTER — Encounter: Payer: Self-pay | Admitting: Obstetrics and Gynecology

## 2021-11-09 VITALS — BP 122/68 | HR 72 | Wt 141.8 lb

## 2021-11-09 DIAGNOSIS — Z4689 Encounter for fitting and adjustment of other specified devices: Secondary | ICD-10-CM

## 2021-11-09 NOTE — Telephone Encounter (Signed)
Patient scheduled on 12/13/21 with Dr.Herrick.

## 2021-11-09 NOTE — Progress Notes (Signed)
GYNECOLOGY  VISIT   HPI: 81 y.o.   Widowed White or Caucasian Not Hispanic or Latino  female   [phone] with Patient's last menstrual period was 01/17/1982 (approximate).   here for a pessary follow up.  At her last visit she was prolapsing some in front of her #3 cube pessary. She was fitted with a #2 3/4 inch gellhorn, not left in secondary to vaginal irritation. She has been using estrogen cream qod since then. She was also treated for yeast and BV.   GYNECOLOGIC HISTORY: Patient's last menstrual period was 01/17/1982 (approximate). Contraception:pmp Menopausal hormone therapy: estrace cream.         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, right ([place]) 02/24/2017   Anemia 11/03/2015   Edema 40/81/4481   Monoallelic mutation of NBN gene    Genetic testing 01/13/2015   Family history of breast cancer    Family history of colon cancer    Breast cancer of lower-inner quadrant of right female breast ([place]) 12/30/2014   Basal cell carcinoma of skin 10/30/2014   Abnormal results of liver function studies 10/30/2014   Callosity 10/30/2014   Age-related osteoporosis without current pathological fracture 07/31/2008   Essential hypertension 07/31/2008    Past Medical History:  Diagnosis Date   Arthritis    Breast cancer ([place]) 11/2014   Family history of breast cancer    Family history of colon cancer    History of kidney stones    History of radiation therapy 08/11/15- 09/23/2015   Right Breast   History of radiation  therapy 11/13/17- 12/29/17   Right chest wall and IM nodes 50.04 Gy in 28 fractions, Right supraclavicular and PAB nodes 50.04 Gy in 28 fractions, Right Chest wall boost/ 10 Gy in 5 fractions.    Hyperlipidemia    Hypertension    Monoallelic mutation of NBN gene    Osteoporosis    Pessary maintenance    Pneumonia    Skin cancer of nose     Past Surgical History:  Procedure Laterality Date   APPENDECTOMY     bilateral cataract surgery      BREAST LUMPECTOMY WITH RADIOACTIVE SEED AND SENTINEL LYMPH NODE BIOPSY Right 02/03/2015   Procedure: RIGHT BREAST LUMPECTOMY WITH RADIOACTIVE SEED AND RIGHT SENTINEL LYMPH NODE BIOPSY;  Surgeon: [name], MD;  Location: [place];  Service: General;  Laterality: Right;   BREAST SURGERY  11/2012   biopsy, benign breast tissue   COLONOSCOPY  06/2010   rec   LITHOTRIPSY     kidney stone on left side   MASTECTOMY W/ SENTINEL NODE BIOPSY Right 03/07/2017   Procedure: RIGHT TOTAL MASTECTOMY WITH RIGHT AXILLARY SENTINEL LYMPH NODE BIOPSY;  Surgeon: [name], MD;  Location: [place];  Service: General;  Laterality: Right;   OVARIAN CYST REMOVAL  age 75   POLYPECTOMY  04/2005   colon   PORTACATH PLACEMENT Left 02/03/2015   Procedure: INSERTION PORT-A-CATH WITH ULTRA SOUND GUIDANCE;  Surgeon: [name], MD;  Location: [place];  Service: General;  Laterality: Left;   portacath removal      reexcision  10/09/2017   Dr. Lucia Gaskins, chest wall excision for recurrent breast cancer.    TOTAL KNEE ARTHROPLASTY Right 05/04/2021   Procedure: TOTAL KNEE ARTHROPLASTY;  Surgeon: Paralee Cancel, MD;  Location: [facility];  Service: Orthopedics;  Laterality: Right;    Current Outpatient Medications  Medication Sig Dispense Refill   acetaminophen (TYLENOL) 650 MG CR tablet Take 650-1,300 mg by mouth every 8 (eight) hours as needed for pain.     Calcium Carbonate-Vit D-Min (CALTRATE 600+D PLUS PO) Take 1 tablet by mouth in the morning.      cholecalciferol (VITAMIN D) 1000 units tablet Take 1,000 Units by mouth daily.     CRESTOR 20 MG tablet Take 20 mg by mouth in the morning.  0   estradiol (ESTRACE) 0.1 MG/GM vaginal cream USE 1 GRAM VAGINALLY 2 TIMES A WEEK AT BEDTIME 42.5 g 1   hydrochlorothiazide (HYDRODIURIL) 12.5 MG tablet Take 12.5 mg by mouth in the morning.     losartan (COZAAR) 100 MG tablet Take 100 mg by mouth in the morning.  0   Polyethyl Glyc-Propyl Glyc PF (SYSTANE PRESERVATIVE FREE) 0.4-0.3 % SOLN Place 1-2 drops into both eyes 3 (three) times daily as needed (dry/irritated eyes).     No current facility-administered medications for this visit.     ALLERGIES: Patient has no known allergies.  Family History  Problem Relation Age of Onset   Colon cancer Sister 53       died at 17   Breast cancer Mother 53   Hypertension Mother    Osteoporosis Mother    Heart disease Father        CABG   Dementia Father    Throat cancer Brother 59   Breast cancer Sister 79   Lymphoma Sister 33   Melanoma Sister    Skin cancer Brother    Skin cancer Daughter     Social History   Socioeconomic History   Marital status: Widowed    Spouse name: Not on file   Number of children: 2   Years of education: Not on file   Highest education level: Not on file  Occupational History   Not on file  Tobacco Use   Smoking status: Never   Smokeless tobacco: Never  Vaping Use   Vaping Use: Never used  Substance and Sexual Activity   Alcohol use: Yes    Comment: rarely   Drug use: No   Sexual activity: Not Currently    Partners: Male    Birth control/protection: Post-menopausal  Other Topics Concern   Not on file  Social History Narrative   Not on file   Social Determinants of Health   Financial Resource Strain: Not on file  Food Insecurity: Not on file  Transportation Needs: No Transportation Needs (11/07/2017)   PRAPARE - Hydrologist (Medical): No    Lack of Transportation  (Non-Medical): No  Physical Activity: Not on file  Stress: Not on file  Social Connections: Not on file  Intimate Partner Violence: Unknown (09/25/2018)   Humiliation, Afraid, Rape, and Kick questionnaire    Fear of Current  or Ex-Partner: Not asked    Emotionally Abused: Not asked    Physically Abused: Not asked    Sexually Abused: Not asked    Review of Systems  All other systems reviewed and are negative.   PHYSICAL EXAMINATION:    BP 122/68   Pulse 72   Wt 141 lb 12.8 oz (64.3 kg)   LMP 01/17/1982 (Approximate)   SpO2 100%   BMI 21.56 kg/m     General appearance: alert, cooperative and appears stated age  Pelvic: External genitalia:  no lesions              Urethra:  normal appearing urethra with no masses, tenderness or lesions              Bartholins and Skenes: normal                 Vagina: well healed and estrogenized, large grade 2 cystocle, grade 1-2 uterine prolapse. #2 3/4 gellhorn placed              Cervix: no lesions               Chaperone was present for exam.  1. Pessary maintenance Prior vaginal irritation has resolved.  New gellhorn pessary placed F/u in 1.5 weeks Use estrogen cream 2 x a week

## 2021-11-16 DIAGNOSIS — H04123 Dry eye syndrome of bilateral lacrimal glands: Secondary | ICD-10-CM | POA: Diagnosis not present

## 2021-11-16 DIAGNOSIS — H5213 Myopia, bilateral: Secondary | ICD-10-CM | POA: Diagnosis not present

## 2021-11-16 DIAGNOSIS — H43813 Vitreous degeneration, bilateral: Secondary | ICD-10-CM | POA: Diagnosis not present

## 2021-11-16 DIAGNOSIS — Z961 Presence of intraocular lens: Secondary | ICD-10-CM | POA: Diagnosis not present

## 2021-11-19 ENCOUNTER — Ambulatory Visit: Payer: Medicare Other | Admitting: Obstetrics and Gynecology

## 2021-11-19 ENCOUNTER — Encounter: Payer: Self-pay | Admitting: Obstetrics and Gynecology

## 2021-11-19 VITALS — BP 122/70 | HR 80 | Wt 144.0 lb

## 2021-11-19 DIAGNOSIS — Z4689 Encounter for fitting and adjustment of other specified devices: Secondary | ICD-10-CM

## 2021-11-19 NOTE — Progress Notes (Signed)
GYNECOLOGY  VISIT   HPI: 81 y.o.   Widowed White or Caucasian Not Hispanic or Latino  female   [phone] with Patient's last menstrual period was 01/17/1982 (approximate).   here for pessary follow up. She was fitted with a #2 3/4 inch gellhorn 1.5 weeks ago and is here for f/u. Using vaginal estrogen 2 x a week. No c/o. The pessary is comfortable, no vaginal bleeding, no bowel or bladder c/o.   GYNECOLOGIC HISTORY: Patient's last menstrual period was 01/17/1982 (approximate). 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, right ([place]) 02/24/2017   Anemia 11/03/2015   Edema 75/64/3329   Monoallelic mutation of NBN gene    Genetic testing 01/13/2015   Family history of breast cancer    Family history of colon cancer    Breast cancer of lower-inner quadrant of right female breast ([place]) 12/30/2014   Basal cell carcinoma of skin 10/30/2014   Abnormal results of liver function studies 10/30/2014   Callosity 10/30/2014   Age-related osteoporosis without current pathological fracture 07/31/2008   Essential hypertension 07/31/2008    Past Medical History:  Diagnosis Date   Arthritis    Breast cancer ([place]) 11/2014   Family history of breast cancer    Family history of colon cancer    History of kidney stones    History of radiation therapy 08/11/15- 09/23/2015   Right Breast   History of radiation therapy 11/13/17- 12/29/17   Right chest wall and IM nodes 50.04 Gy  in 28 fractions, Right supraclavicular and PAB nodes 50.04 Gy in 28 fractions, Right Chest wall boost/ 10 Gy in 5 fractions.    Hyperlipidemia    Hypertension    Monoallelic mutation of NBN gene    Osteoporosis    Pessary maintenance    Pneumonia    Skin cancer of nose     Past Surgical History:  Procedure Laterality Date   APPENDECTOMY     bilateral cataract surgery      BREAST LUMPECTOMY WITH RADIOACTIVE SEED AND SENTINEL LYMPH NODE BIOPSY Right 02/03/2015   Procedure: RIGHT BREAST LUMPECTOMY WITH RADIOACTIVE SEED AND RIGHT SENTINEL LYMPH NODE BIOPSY;  Surgeon: [name], MD;  Location: [place];  Service: General;  Laterality: Right;   BREAST SURGERY  11/2012   biopsy, benign breast tissue   COLONOSCOPY  06/2010   rec   LITHOTRIPSY     kidney stone on left side   MASTECTOMY W/ SENTINEL NODE BIOPSY Right 03/07/2017   Procedure: RIGHT TOTAL MASTECTOMY WITH RIGHT AXILLARY SENTINEL LYMPH NODE BIOPSY;  Surgeon: [name], MD;  Location: [place];  Service: General;  Laterality: Right;   OVARIAN CYST REMOVAL  age 4   POLYPECTOMY  04/2005   colon   PORTACATH PLACEMENT Left 02/03/2015   Procedure: INSERTION PORT-A-CATH WITH ULTRA SOUND GUIDANCE;  Surgeon: [name], MD;  Location: [place];  Service: General;  Laterality: Left;   portacath removal      reexcision  10/09/2017   Dr. Lucia Gaskins, chest wall excision for recurrent breast cancer.    TOTAL KNEE ARTHROPLASTY Right 05/04/2021   Procedure: TOTAL KNEE ARTHROPLASTY;  Surgeon: Paralee Cancel, MD;  [facility];  Service: Orthopedics;  Laterality: Right;    Current Outpatient Medications  Medication Sig Dispense Refill   acetaminophen (TYLENOL) 650 MG CR tablet Take 650-1,300 mg by mouth every 8 (eight) hours as needed for pain.     Calcium Carbonate-Vit D-Min (CALTRATE 600+D PLUS PO) Take 1 tablet by mouth in the morning.     cholecalciferol (VITAMIN D) 1000 units tablet Take 1,000 Units  by mouth daily.     CRESTOR 20 MG tablet Take 20 mg by mouth in the morning.  0   estradiol (ESTRACE) 0.1 MG/GM vaginal cream USE 1 GRAM VAGINALLY 2 TIMES A WEEK AT BEDTIME 42.5 g 1   hydrochlorothiazide (HYDRODIURIL) 12.5 MG tablet Take 12.5 mg by mouth in the morning.     losartan (COZAAR) 100 MG tablet Take 100 mg by mouth in the morning.  0   Polyethyl Glyc-Propyl Glyc PF (SYSTANE PRESERVATIVE FREE) 0.4-0.3 % SOLN Place 1-2 drops into both eyes 3 (three) times daily as needed (dry/irritated eyes).     No current facility-administered medications for this visit.     ALLERGIES: Patient has no known allergies.  Family History  Problem Relation Age of Onset   Colon cancer Sister 58       died at 75   Breast cancer Mother 63   Hypertension Mother    Osteoporosis Mother    Heart disease Father        CABG   Dementia Father    Throat cancer Brother 17   Breast cancer Sister 38   Lymphoma Sister 77   Melanoma Sister    Skin cancer Brother    Skin cancer Daughter     Social History   Socioeconomic History   Marital status: Widowed    Spouse name: Not on file   Number of children: 2   Years of education: Not on file   Highest education level: Not on file  Occupational History   Not on file  Tobacco Use   Smoking status: Never   Smokeless tobacco: Never  Vaping Use   Vaping Use: Never used  Substance and Sexual Activity   Alcohol use: Yes    Comment: rarely   Drug use: No   Sexual activity: Not Currently    Partners: Male    Birth control/protection: Post-menopausal  Other Topics Concern   Not on file  Social History Narrative   Not on file   Social Determinants of Health   Financial Resource Strain: Not on file  Food Insecurity: Not on file  Transportation Needs: No Transportation Needs (11/07/2017)   PRAPARE - Hydrologist (Medical): No    Lack of Transportation (Non-Medical): No  Physical Activity: Not on file  Stress: Not on  file  Social Connections: Not on file  Intimate Partner Violence: Unknown (09/25/2018)   Humiliation, Afraid, Rape, and Kick questionnaire    Fear of Current or Ex-Partner: Not asked    Emotionally Abused: Not asked  Physically Abused: Not asked    Sexually Abused: Not asked    Review of Systems  All other systems reviewed and are negative.   PHYSICAL EXAMINATION:    BP 122/70   Pulse 80   Wt 144 lb (65.3 kg)   LMP 01/17/1982 (Approximate)   SpO2 97%   BMI 21.90 kg/m     General appearance: alert, cooperative and appears stated age   Pelvic: External genitalia:  no lesions              Urethra:  normal appearing urethra with no masses, tenderness or lesions              Bartholins and Skenes: normal                 Vagina: the pessary was removed and cleaned, no vaginal irritation              Cervix: no lesions                Chaperone was present for exam.  1. Pessary maintenance Doing well F/U in one month Continue with 2 x a week vaginal estrogen

## 2021-11-29 ENCOUNTER — Ambulatory Visit: Payer: Self-pay

## 2021-11-29 NOTE — Patient Outreach (Signed)
  Care Coordination   11/29/2021 Name: Shawna [name] MRN: [id number] DOB: 08/11/1940   Care Coordination Outreach Attempts:  An unsuccessful telephone outreach was attempted today to offer the patient information about available care coordination services as a benefit of their health plan.   Follow Up Plan:  Additional outreach attempts will be made to offer the patient care coordination information and services.   Encounter Outcome:  No Answer  Care Coordination Interventions Activated:  No   Care Coordination Interventions:  No, not indicated    Daneen Schick, BSW, CDP Social Worker, Certified Dementia Practitioner [facility] Care Management  Care Coordination [phone]

## 2021-12-13 DIAGNOSIS — N393 Stress incontinence (female) (male): Secondary | ICD-10-CM | POA: Diagnosis not present

## 2021-12-13 DIAGNOSIS — N3281 Overactive bladder: Secondary | ICD-10-CM | POA: Diagnosis not present

## 2021-12-14 DIAGNOSIS — E785 Hyperlipidemia, unspecified: Secondary | ICD-10-CM | POA: Diagnosis not present

## 2021-12-14 DIAGNOSIS — R7989 Other specified abnormal findings of blood chemistry: Secondary | ICD-10-CM | POA: Diagnosis not present

## 2021-12-14 DIAGNOSIS — I10 Essential (primary) hypertension: Secondary | ICD-10-CM | POA: Diagnosis not present

## 2021-12-14 DIAGNOSIS — Z1212 Encounter for screening for malignant neoplasm of rectum: Secondary | ICD-10-CM | POA: Diagnosis not present

## 2021-12-14 DIAGNOSIS — D649 Anemia, unspecified: Secondary | ICD-10-CM | POA: Diagnosis not present

## 2021-12-15 ENCOUNTER — Ambulatory Visit: Payer: Self-pay

## 2021-12-15 DIAGNOSIS — Z96651 Presence of right artificial knee joint: Secondary | ICD-10-CM | POA: Diagnosis not present

## 2021-12-15 DIAGNOSIS — M1712 Unilateral primary osteoarthritis, left knee: Secondary | ICD-10-CM | POA: Diagnosis not present

## 2021-12-15 NOTE — Patient Outreach (Signed)
  Care Coordination   12/15/2021 Name: Shawna [name] MRN: [id number] DOB: 04-07-1940   Care Coordination Outreach Attempts:  A second unsuccessful outreach was attempted today to offer the patient with information about available care coordination services as a benefit of their health plan.     Follow Up Plan:  Additional outreach attempts will be made to offer the patient care coordination information and services.   Encounter Outcome:  No Answer   Care Coordination Interventions:  No, not indicated    Daneen Schick, BSW, CDP Social Worker, Certified Dementia Practitioner [facility]  Care Coordination [phone]

## 2021-12-19 DIAGNOSIS — M1712 Unilateral primary osteoarthritis, left knee: Secondary | ICD-10-CM | POA: Insufficient documentation

## 2021-12-21 DIAGNOSIS — I972 Postmastectomy lymphedema syndrome: Secondary | ICD-10-CM | POA: Diagnosis not present

## 2021-12-21 DIAGNOSIS — Z0000 Encounter for general adult medical examination without abnormal findings: Secondary | ICD-10-CM | POA: Diagnosis not present

## 2021-12-21 DIAGNOSIS — N1831 Chronic kidney disease, stage 3a: Secondary | ICD-10-CM | POA: Diagnosis not present

## 2021-12-21 DIAGNOSIS — I10 Essential (primary) hypertension: Secondary | ICD-10-CM | POA: Diagnosis not present

## 2021-12-21 DIAGNOSIS — R82998 Other abnormal findings in urine: Secondary | ICD-10-CM | POA: Diagnosis not present

## 2021-12-21 DIAGNOSIS — Z1389 Encounter for screening for other disorder: Secondary | ICD-10-CM | POA: Diagnosis not present

## 2021-12-21 DIAGNOSIS — Z1331 Encounter for screening for depression: Secondary | ICD-10-CM | POA: Diagnosis not present

## 2021-12-22 ENCOUNTER — Encounter: Payer: Self-pay | Admitting: Obstetrics and Gynecology

## 2021-12-22 ENCOUNTER — Ambulatory Visit: Payer: Medicare Other | Admitting: Obstetrics and Gynecology

## 2021-12-22 VITALS — BP 132/76 | HR 66 | Wt 142.0 lb

## 2021-12-22 DIAGNOSIS — Z4689 Encounter for fitting and adjustment of other specified devices: Secondary | ICD-10-CM | POA: Diagnosis not present

## 2021-12-22 NOTE — Progress Notes (Signed)
GYNECOLOGY  VISIT   HPI: 81 y.o.   Widowed White or Caucasian Not Hispanic or Latino  female   [phone] with Patient's last menstrual period was 01/17/1982 (approximate).   here for a pessary check. She was fitted with a 2 3/4 inch gellhorn in 10/23 and is here for f/u. Using vaginal estrogen 2 x a week.  Doing well, no vaginal bleeding, no bowel or bladder c/o.  GYNECOLOGIC HISTORY: Patient's last menstrual period was 01/17/1982 (approximate). 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, right ([place]) 02/24/2017   Anemia 11/03/2015   Edema 09/62/8366   Monoallelic mutation of NBN gene    Genetic testing 01/13/2015   Family history of breast cancer    Family history of colon cancer    Breast cancer of lower-inner quadrant of right female breast ([place]) 12/30/2014   Basal cell carcinoma of skin 10/30/2014   Abnormal results of liver function studies 10/30/2014   Callosity 10/30/2014   Age-related osteoporosis without current pathological fracture 07/31/2008   Essential hypertension 07/31/2008    Past Medical History:  Diagnosis Date   Arthritis    Breast cancer ([place]) 11/2014   Family history of breast cancer    Family history of colon cancer    History of kidney stones    History of radiation therapy 08/11/15- 09/23/2015   Right Breast   History of radiation therapy 11/13/17- 12/29/17   Right chest wall and IM nodes 50.04 Gy in 28 fractions, Right  supraclavicular and PAB nodes 50.04 Gy in 28 fractions, Right Chest wall boost/ 10 Gy in 5 fractions.    Hyperlipidemia    Hypertension    Monoallelic mutation of NBN gene    Osteoporosis    Pessary maintenance    Pneumonia    Skin cancer of nose     Past Surgical History:  Procedure Laterality Date   APPENDECTOMY     bilateral cataract surgery      BREAST LUMPECTOMY WITH RADIOACTIVE SEED AND SENTINEL LYMPH NODE BIOPSY Right 02/03/2015   Procedure: RIGHT BREAST LUMPECTOMY WITH RADIOACTIVE SEED AND RIGHT SENTINEL LYMPH NODE BIOPSY;  Surgeon: [name], MD;  Location: [place];  Service: General;  Laterality: Right;   BREAST SURGERY  11/2012   biopsy, benign breast tissue   COLONOSCOPY  06/2010   rec   LITHOTRIPSY     kidney stone on left side   MASTECTOMY W/ SENTINEL NODE BIOPSY Right 03/07/2017   Procedure: RIGHT TOTAL MASTECTOMY WITH RIGHT AXILLARY SENTINEL LYMPH NODE BIOPSY;  Surgeon: [name], MD;  Location: [place];  Service: General;  Laterality: Right;   OVARIAN CYST REMOVAL  age 35   POLYPECTOMY  04/2005   colon   PORTACATH PLACEMENT Left 02/03/2015   Procedure: INSERTION PORT-A-CATH WITH ULTRA SOUND GUIDANCE;  Surgeon: [name], MD;  Location: [place];  Service: General;  Laterality: Left;   portacath removal      reexcision  10/09/2017   Dr. Lucia Gaskins, chest wall excision for recurrent breast cancer.    TOTAL KNEE ARTHROPLASTY Right 05/04/2021   Procedure: TOTAL KNEE ARTHROPLASTY;  Surgeon: Paralee Cancel, MD;  [facility];  Service: Orthopedics;  Laterality: Right;    Current Outpatient Medications  Medication Sig Dispense Refill   acetaminophen (TYLENOL) 650 MG CR tablet Take 650-1,300 mg by mouth every 8 (eight) hours as needed for pain.     Calcium Carbonate-Vit D-Min (CALTRATE 600+D PLUS PO) Take 1 tablet by mouth in the morning.     cholecalciferol (VITAMIN D) 1000 units tablet Take 1,000 Units by mouth daily.      CRESTOR 20 MG tablet Take 20 mg by mouth in the morning.  0   estradiol (ESTRACE) 0.1 MG/GM vaginal cream USE 1 GRAM VAGINALLY 2 TIMES A WEEK AT BEDTIME 42.5 g 1   hydrochlorothiazide (HYDRODIURIL) 12.5 MG tablet Take 12.5 mg by mouth in the morning.     losartan (COZAAR) 100 MG tablet Take 100 mg by mouth in the morning.  0   Polyethyl Glyc-Propyl Glyc PF (SYSTANE PRESERVATIVE FREE) 0.4-0.3 % SOLN Place 1-2 drops into both eyes 3 (three) times daily as needed (dry/irritated eyes).     No current facility-administered medications for this visit.     ALLERGIES: Patient has no known allergies.  Family History  Problem Relation Age of Onset   Colon cancer Sister 67       died at 76   Breast cancer Mother 63   Hypertension Mother    Osteoporosis Mother    Heart disease Father        CABG   Dementia Father    Throat cancer Brother 46   Breast cancer Sister 47   Lymphoma Sister 57   Melanoma Sister    Skin cancer Brother    Skin cancer Daughter     Social History   Socioeconomic History   Marital status: Widowed    Spouse name: Not on file   Number of children: 2   Years of education: Not on file   Highest education level: Not on file  Occupational History   Not on file  Tobacco Use   Smoking status: Never   Smokeless tobacco: Never  Vaping Use   Vaping Use: Never used  Substance and Sexual Activity   Alcohol use: Yes    Comment: rarely   Drug use: No   Sexual activity: Not Currently    Partners: Male    Birth control/protection: Post-menopausal  Other Topics Concern   Not on file  Social History Narrative   Not on file   Social Determinants of Health   Financial Resource Strain: Not on file  Food Insecurity: Not on file  Transportation Needs: No Transportation Needs (11/07/2017)   PRAPARE - Hydrologist (Medical): No    Lack of Transportation (Non-Medical): No  Physical Activity: Not on file  Stress: Not on file  Social  Connections: Not on file  Intimate Partner Violence: Unknown (09/25/2018)   Humiliation, Afraid, Rape, and Kick questionnaire    Fear of Current or Ex-Partner: Not asked    Emotionally Abused: Not asked    Physically Abused: Not  asked    Sexually Abused: Not asked    ROS  PHYSICAL EXAMINATION:    LMP 01/17/1982 (Approximate)     General appearance: alert, cooperative and appears stated age  Pelvic: External genitalia:  no lesions              Urethra:  normal appearing urethra with no masses, tenderness or lesions              Bartholins and Skenes: normal                 Vagina: the pessary was removed and cleaned, no vaginal irritation. 1 gram of estrogen cream placed, pessary replaced.               Cervix: no lesions               Chaperone was present for exam.  1. Pessary maintenance Doing well F/U in 3 months Continue with 2 x a week vaginal estrogen

## 2021-12-24 DIAGNOSIS — C50911 Malignant neoplasm of unspecified site of right female breast: Secondary | ICD-10-CM | POA: Diagnosis not present

## 2022-01-18 DIAGNOSIS — C50911 Malignant neoplasm of unspecified site of right female breast: Secondary | ICD-10-CM | POA: Diagnosis not present

## 2022-01-19 DIAGNOSIS — N393 Stress incontinence (female) (male): Secondary | ICD-10-CM | POA: Diagnosis not present

## 2022-01-19 DIAGNOSIS — N3281 Overactive bladder: Secondary | ICD-10-CM | POA: Diagnosis not present

## 2022-01-19 DIAGNOSIS — R278 Other lack of coordination: Secondary | ICD-10-CM | POA: Diagnosis not present

## 2022-02-15 DIAGNOSIS — M62838 Other muscle spasm: Secondary | ICD-10-CM | POA: Diagnosis not present

## 2022-02-15 DIAGNOSIS — M6281 Muscle weakness (generalized): Secondary | ICD-10-CM | POA: Diagnosis not present

## 2022-02-15 DIAGNOSIS — N3941 Urge incontinence: Secondary | ICD-10-CM | POA: Diagnosis not present

## 2022-02-15 DIAGNOSIS — M6289 Other specified disorders of muscle: Secondary | ICD-10-CM | POA: Diagnosis not present

## 2022-03-07 DIAGNOSIS — Z1231 Encounter for screening mammogram for malignant neoplasm of breast: Secondary | ICD-10-CM | POA: Diagnosis not present

## 2022-03-08 ENCOUNTER — Encounter: Payer: Self-pay | Admitting: Obstetrics and Gynecology

## 2022-03-11 DIAGNOSIS — N3281 Overactive bladder: Secondary | ICD-10-CM | POA: Diagnosis not present

## 2022-03-23 ENCOUNTER — Ambulatory Visit: Payer: Medicare Other | Admitting: Obstetrics and Gynecology

## 2022-03-23 ENCOUNTER — Encounter: Payer: Self-pay | Admitting: Obstetrics and Gynecology

## 2022-03-23 VITALS — BP 120/80 | HR 66 | Wt 144.0 lb

## 2022-03-23 DIAGNOSIS — Z4689 Encounter for fitting and adjustment of other specified devices: Secondary | ICD-10-CM

## 2022-03-23 NOTE — Progress Notes (Signed)
GYNECOLOGY  VISIT   HPI: 82 y.o.   Widowed White or Caucasian Not Hispanic or Latino  female   [phone] with Patient's last menstrual period was 01/17/1982 (approximate).   here for pessary maintenance. She was fitted with a 2 3/4 inch gellhorn in 10/23 and is here for f/u. Using vaginal estrogen 2 x a week.  Doing well, no vaginal bleeding, no bowel or bladder c/o.  The Urologist started her on Gemtesa for her incontinence which is helping.   GYNECOLOGIC HISTORY: Patient's last menstrual period was 01/17/1982 (approximate). Contraception:pmp Menopausal hormone therapy: estradiol.         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, right ([place]) 02/24/2017   Anemia 11/03/2015   Edema [id number]   Monoallelic mutation of NBN gene    Genetic testing 01/13/2015   Family history of breast cancer    Family history of colon cancer    Breast cancer of lower-inner quadrant of right female breast ([place]) 12/30/2014   Basal cell carcinoma of skin 10/30/2014   Abnormal results of liver function studies 10/30/2014   Callosity 10/30/2014   Age-related osteoporosis without current pathological fracture 07/31/2008   Essential hypertension 07/31/2008    Past Medical History:  Diagnosis Date   Arthritis    Breast cancer ([place]) 11/2014   Family history of breast cancer    Family history of colon cancer     History of kidney stones    History of radiation therapy 08/11/15- 09/23/2015   Right Breast   History of radiation therapy 11/13/17- 12/29/17   Right chest wall and IM nodes 50.04 Gy in 28 fractions, Right supraclavicular and PAB nodes 50.04 Gy in 28 fractions, Right Chest wall boost/ 10 Gy in 5 fractions.    Hyperlipidemia    Hypertension    Monoallelic mutation of NBN gene    Osteoporosis    Pessary maintenance    Pneumonia    Skin cancer of nose     Past Surgical History:  Procedure Laterality Date   APPENDECTOMY     bilateral cataract surgery      BREAST LUMPECTOMY WITH RADIOACTIVE SEED AND SENTINEL LYMPH NODE BIOPSY Right 02/03/2015   Procedure: RIGHT BREAST LUMPECTOMY WITH RADIOACTIVE SEED AND RIGHT SENTINEL LYMPH NODE BIOPSY;  Surgeon: [name], MD;  Location: [place];  Service: General;  Laterality: Right;   BREAST SURGERY  11/2012   biopsy, benign breast tissue   COLONOSCOPY  06/2010   rec   LITHOTRIPSY     kidney stone on left side   MASTECTOMY W/ SENTINEL NODE BIOPSY Right 03/07/2017   Procedure: RIGHT  TOTAL MASTECTOMY WITH RIGHT AXILLARY SENTINEL LYMPH NODE BIOPSY;  Surgeon: [name], MD;  Location: [place];  Service: General;  Laterality: Right;   OVARIAN CYST REMOVAL  age 44   POLYPECTOMY  04/2005   colon   PORTACATH PLACEMENT Left 02/03/2015   Procedure: INSERTION PORT-A-CATH WITH ULTRA SOUND GUIDANCE;  Surgeon: [name], MD;  Location: [place];  Service: General;  Laterality: Left;   portacath removal      reexcision  10/09/2017   Dr. Lucia Gaskins, chest wall excision for recurrent breast cancer.    TOTAL KNEE ARTHROPLASTY Right 05/04/2021   Procedure: TOTAL KNEE ARTHROPLASTY;  Surgeon: Paralee Cancel, MD;  Location: [facility];  Service: Orthopedics;  Laterality: Right;    Current Outpatient Medications  Medication Sig Dispense Refill   acetaminophen (TYLENOL) 650 MG CR tablet Take 650-1,300 mg by mouth every 8 (eight) hours  as needed for pain.     Calcium Carbonate-Vit D-Min (CALTRATE 600+D PLUS PO) Take 1 tablet by mouth in the morning.     cholecalciferol (VITAMIN D) 1000 units tablet Take 1,000 Units by mouth daily.     CRESTOR 20 MG tablet Take 20 mg by mouth in the morning.  0   estradiol (ESTRACE) 0.1 MG/GM vaginal cream USE 1 GRAM VAGINALLY 2 TIMES A WEEK AT BEDTIME 42.5 g 1   hydrochlorothiazide (HYDRODIURIL) 12.5 MG tablet Take 12.5 mg by mouth in the morning.     losartan (COZAAR) 100 MG tablet Take 100 mg by mouth in the morning.  0   Polyethyl Glyc-Propyl Glyc PF (SYSTANE PRESERVATIVE FREE) 0.4-0.3 % SOLN Place 1-2 drops into both eyes 3 (three) times daily as needed (dry/irritated eyes).     Vibegron (GEMTESA) 75 MG TABS 1 tablet Orally Once a day     No current facility-administered medications for this visit.     ALLERGIES: Patient has no known allergies.  Family History  Problem Relation Age of Onset   Colon cancer Sister 15       died at 36   Breast cancer Mother 51   Hypertension Mother    Osteoporosis Mother    Heart disease Father        CABG   Dementia Father    Throat cancer Brother 18   Breast cancer Sister 15   Lymphoma Sister 42   Melanoma Sister    Skin cancer Brother    Skin cancer Daughter     Social History   Socioeconomic History   Marital status: Widowed    Spouse name: Not on file   Number of children: 2   Years of education: Not on file   Highest education level: Not on file  Occupational History   Not on file  Tobacco Use   Smoking status: Never   Smokeless tobacco: Never  Vaping Use   Vaping Use: Never used  Substance and Sexual Activity   Alcohol use: Yes    Comment: rarely   Drug use: No   Sexual activity: Not Currently    Partners: Male    Birth control/protection: Post-menopausal  Other Topics Concern   Not on file  Social History Narrative   Not on file   Social Determinants of Health   Financial Resource Strain: Not on file  Food  Insecurity: Not on file  Transportation Needs: No Transportation Needs (11/07/2017)   PRAPARE - Hydrologist (Medical): No    Lack of Transportation (Non-Medical): No  Physical Activity:  Not on file  Stress: Not on file  Social Connections: Not on file  Intimate Partner Violence: Unknown (09/25/2018)   Humiliation, Afraid, Rape, and Kick questionnaire    Fear of Current or Ex-Partner: Not asked    Emotionally Abused: Not asked    Physically Abused: Not asked    Sexually Abused: Not asked    Review of Systems  All other systems reviewed and are negative.   PHYSICAL EXAMINATION:    BP 120/80   Pulse 66   Wt 144 lb (65.3 kg)   LMP 01/17/1982 (Approximate)   SpO2 98%   BMI 21.90 kg/m     General appearance: alert, cooperative and appears stated age  Pelvic: External genitalia:  no lesions              Urethra:  normal appearing urethra with no masses, tenderness or lesions              Bartholins and Skenes: normal                 Vagina: the pessary was removed and cleaned, no vaginal irritation. Vaginal estrogen cream was placed vaginally and the pessary was replaced.               Cervix: no lesions               Chaperone was present for exam.

## 2022-04-06 ENCOUNTER — Ambulatory Visit: Payer: Medicare Other | Admitting: Podiatry

## 2022-04-06 ENCOUNTER — Encounter: Payer: Self-pay | Admitting: Podiatry

## 2022-04-06 DIAGNOSIS — B351 Tinea unguium: Secondary | ICD-10-CM

## 2022-04-06 DIAGNOSIS — M79675 Pain in left toe(s): Secondary | ICD-10-CM | POA: Diagnosis not present

## 2022-04-06 DIAGNOSIS — L84 Corns and callosities: Secondary | ICD-10-CM | POA: Diagnosis not present

## 2022-04-06 DIAGNOSIS — M79674 Pain in right toe(s): Secondary | ICD-10-CM

## 2022-04-06 NOTE — Progress Notes (Signed)
Subjective:   Patient ID: Shawna [name], female   DOB: 82 y.o.   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

## 2022-05-09 ENCOUNTER — Other Ambulatory Visit: Payer: Self-pay

## 2022-05-09 DIAGNOSIS — K080 Exfoliation of teeth due to systemic causes: Secondary | ICD-10-CM | POA: Diagnosis not present

## 2022-05-09 MED ORDER — ESTRADIOL 0.1 MG/GM VA CREA: TOPICAL_CREAM | VAGINAL | 1 refills | Status: DC

## 2022-05-09 NOTE — Telephone Encounter (Signed)
Medication refill request: estradiol cream  Last AEX:  11/25/20 last ov 03/23/22  Next AEX: 06/29/22 OV  Last MMG (if hormonal medication request): 03/07/22  normal  Refill authorized: 42.5g with 1 rf pended

## 2022-06-28 ENCOUNTER — Ambulatory Visit: Payer: Medicare Other | Admitting: Obstetrics and Gynecology

## 2022-06-28 DIAGNOSIS — I972 Postmastectomy lymphedema syndrome: Secondary | ICD-10-CM | POA: Diagnosis not present

## 2022-06-28 DIAGNOSIS — I10 Essential (primary) hypertension: Secondary | ICD-10-CM | POA: Diagnosis not present

## 2022-06-28 DIAGNOSIS — E559 Vitamin D deficiency, unspecified: Secondary | ICD-10-CM | POA: Diagnosis not present

## 2022-06-29 ENCOUNTER — Encounter: Payer: Self-pay | Admitting: Obstetrics and Gynecology

## 2022-06-29 ENCOUNTER — Ambulatory Visit (INDEPENDENT_AMBULATORY_CARE_PROVIDER_SITE_OTHER): Payer: Medicare Other | Admitting: Obstetrics and Gynecology

## 2022-06-29 VITALS — BP 128/74 | HR 88 | Wt 142.0 lb

## 2022-06-29 DIAGNOSIS — N952 Postmenopausal atrophic vaginitis: Secondary | ICD-10-CM | POA: Diagnosis not present

## 2022-06-29 DIAGNOSIS — Z4689 Encounter for fitting and adjustment of other specified devices: Secondary | ICD-10-CM

## 2022-06-29 NOTE — Progress Notes (Signed)
GYNECOLOGY  VISIT   HPI: 82 y.o.   Widowed White or Caucasian Not Hispanic or Latino  female   [phone] with Patient's last menstrual period was 01/17/1982 (approximate).   here for  pessary  maintenance. 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 Uses vaginal estrogen 2 x a week. No vaginal bleeding. No c/o.  GYNECOLOGIC HISTORY: Patient's last menstrual period was 01/17/1982 (approximate). Contraception:pmp Menopausal hormone therapy: estradiol cream.         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, right ([facility]) 02/24/2017   Anemia 11/03/2015   Edema 07/15/2015   Monoallelic mutation of NBN gene    Genetic testing 01/13/2015   Family history of breast cancer    Family history of colon cancer    Breast cancer of lower-inner quadrant of right female breast ([facility]) 12/30/2014   Basal cell carcinoma of skin 10/30/2014   Abnormal results of liver function studies 10/30/2014   Callosity 10/30/2014   Age-related osteoporosis without current pathological fracture 07/31/2008   Essential hypertension 07/31/2008    Past Medical History:  Diagnosis Date   Arthritis    Breast cancer ([facility]) 11/2014   Family history of breast cancer    Family history of colon cancer    History of kidney stones    History of radiation therapy 08/11/15- 09/23/2015   Right Breast   History of radiation therapy 11/13/17- 12/29/17    Right chest wall and IM nodes 50.04 Gy in 28 fractions, Right supraclavicular and PAB nodes 50.04 Gy in 28 fractions, Right Chest wall boost/ 10 Gy in 5 fractions.    Hyperlipidemia    Hypertension    Monoallelic mutation of NBN gene    Osteoporosis    Pessary maintenance    Pneumonia    Skin cancer of nose     Past Surgical History:  Procedure Laterality Date   APPENDECTOMY     bilateral cataract surgery      BREAST LUMPECTOMY WITH RADIOACTIVE SEED AND SENTINEL LYMPH NODE BIOPSY Right 02/03/2015   Procedure: RIGHT BREAST LUMPECTOMY WITH RADIOACTIVE SEED AND RIGHT SENTINEL LYMPH NODE BIOPSY;  Surgeon: Ovidio Kin, MD;  Location: [facility];  Service: General;  Laterality: Right;   BREAST SURGERY  11/2012   biopsy, benign breast tissue   COLONOSCOPY  06/2010   rec   LITHOTRIPSY     kidney stone on left side   MASTECTOMY W/ SENTINEL NODE BIOPSY Right 03/07/2017   Procedure: RIGHT TOTAL MASTECTOMY WITH RIGHT AXILLARY SENTINEL LYMPH NODE BIOPSY;  Surgeon: Ovidio Kin, MD;  Location: [facility];  Service: General;  Laterality: Right;  OVARIAN CYST REMOVAL  age 30   POLYPECTOMY  04/2005   colon   PORTACATH PLACEMENT Left 02/03/2015   Procedure: INSERTION PORT-A-CATH WITH ULTRA SOUND GUIDANCE;  Surgeon: Ovidio Kin, MD;  Location: [facility];  Service: General;  Laterality: Left;   portacath removal      reexcision  10/09/2017   Dr. Ezzard Standing, chest wall excision for recurrent breast cancer.    TOTAL KNEE ARTHROPLASTY Right 05/04/2021   Procedure: TOTAL KNEE ARTHROPLASTY;  Surgeon: Durene Romans, MD;  Location: [facility];  Service: Orthopedics;  Laterality: Right;    Current Outpatient Medications  Medication Sig Dispense Refill   CRESTOR 20 MG tablet Take 20 mg by mouth in the morning.  0   estradiol (ESTRACE) 0.1 MG/GM vaginal cream Use 1 gram vaginally 2 times weekly at bedtime 42.5 g 1   hydrochlorothiazide (HYDRODIURIL) 12.5 MG tablet Take 12.5 mg by mouth  in the morning.     losartan (COZAAR) 100 MG tablet Take 100 mg by mouth in the morning.  0   Multiple Vitamin (MULTIVITAMIN) tablet Take 1 tablet by mouth daily.     Polyethyl Glyc-Propyl Glyc PF (SYSTANE PRESERVATIVE FREE) 0.4-0.3 % SOLN Place 1-2 drops into both eyes 3 (three) times daily as needed (dry/irritated eyes).     Vibegron (GEMTESA) 75 MG TABS 1 tablet Orally Once a day     No current facility-administered medications for this visit.     ALLERGIES: Patient has no known allergies.  Family History  Problem Relation Age of Onset   Colon cancer Sister 86       died at 59   Breast cancer Mother 26   Hypertension Mother    Osteoporosis Mother    Heart disease Father        CABG   Dementia Father    Throat cancer Brother 62   Breast cancer Sister 34   Lymphoma Sister 77   Melanoma Sister    Skin cancer Brother    Skin cancer Daughter     Social History   Socioeconomic History   Marital status: Widowed    Spouse name: Not on file   Number of children: 2   Years of education: Not on file   Highest education level: Not on file  Occupational History   Not on file  Tobacco Use   Smoking status: Never   Smokeless tobacco: Never  Vaping Use   Vaping Use: Never used  Substance and Sexual Activity   Alcohol use: Yes    Comment: rarely   Drug use: No   Sexual activity: Not Currently    Partners: Male    Birth control/protection: Post-menopausal  Other Topics Concern   Not on file  Social History Narrative   Not on file   Social Determinants of Health   Financial Resource Strain: Not on file  Food Insecurity: Not on file  Transportation Needs: No Transportation Needs (11/07/2017)   PRAPARE - Administrator, Civil Service (Medical): No    Lack of Transportation (Non-Medical): No  Physical Activity: Not on file  Stress: Not on file  Social Connections: Not on file  Intimate Partner Violence: Unknown (09/25/2018)   Humiliation, Afraid, Rape, and  Kick questionnaire    Fear of Current or Ex-Partner: Not asked    Emotionally Abused: Not asked    Physically Abused: Not asked    Sexually Abused: Not asked    Review of Systems  All other systems reviewed and  are negative.   PHYSICAL EXAMINATION:    BP 128/74   Pulse 88   Wt 142 lb (64.4 kg)   LMP 01/17/1982 (Approximate)   SpO2 100%   BMI 21.59 kg/m     General appearance: alert, cooperative and appears stated age  Pelvic: External genitalia:  no lesions              Urethra:  normal appearing urethra with no masses, tenderness or lesions              Bartholins and Skenes: normal                 Vagina: the pessary was removed and cleaned, no vaginal irritation. 2 grams of estrace cream placed vaginally. Pessary replaced.               Cervix: no lesions               Chaperone, Carolynn Serve, CMA was present for exam.  1. Pessary maintenance Doing well F/U in 3 months  2. Vaginal atrophy Continue with 2 x weekly use. She had vaginal irritation from pessary use prior to the vaginal estrogen. Her Oncologist is aware she is using it.

## 2022-07-06 DIAGNOSIS — L821 Other seborrheic keratosis: Secondary | ICD-10-CM | POA: Diagnosis not present

## 2022-07-06 DIAGNOSIS — Z85828 Personal history of other malignant neoplasm of skin: Secondary | ICD-10-CM | POA: Diagnosis not present

## 2022-07-06 DIAGNOSIS — D225 Melanocytic nevi of trunk: Secondary | ICD-10-CM | POA: Diagnosis not present

## 2022-07-06 DIAGNOSIS — L570 Actinic keratosis: Secondary | ICD-10-CM | POA: Diagnosis not present

## 2022-07-06 DIAGNOSIS — L7211 Pilar cyst: Secondary | ICD-10-CM | POA: Diagnosis not present

## 2022-07-30 NOTE — Progress Notes (Signed)
Patient Care Team: Creola Corn, MD as PCP - General (Internal Medicine) Ovidio Kin, MD as Consulting Physician (General Surgery) Serena Croissant, MD as Consulting Physician (Hematology and Oncology) Axel Filler Larna Daughters, NP as Nurse Practitioner (Hematology and Oncology) Romualdo Bolk, MD (Inactive) as Consulting Physician (Obstetrics and Gynecology)  DIAGNOSIS:  Encounter Diagnosis  Name Primary?   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 Genes tested include:  ATM, BARD1, BRCA1, BRCA2, BRIP1, CDH1, CHEK2, EPCAM, FANCC, MLH1, MSH2, MSH6, NBN, PALB2, PMS2, PTEN, RAD51C, RAD51D, TP53, and XRCC2.   02/03/2015 Surgery   Right lumpectomy: Invasive ductal carcinoma grade 2, 2.1 cm, with associated DCIS, margins are negative, 0/1 lymph node, ER 0%, PR 0%, HER-2 positive ratio 2.31, T2 N0 stage II a   03/09/2015 - 06/22/2015 Chemotherapy   Adjuvant chemotherapy with TCH 6 followed by Herceptin maintenance for 1 year   08/11/2015 - 09/23/2015 Radiation Therapy   Adjuvant radiation therapy Basilio [facility]):  1) Right Breast / 50 Gy in 25 fractions ; 2) Right Breast Boost / 10 Gy in 5 fractions   01/30/2017 Relapse/Recurrence   Skin biopsy right breast: Metastatic breast cancer GCDFP strongly positive   02/15/2017 PET scan   Hypermetabolic focus of skin thickening along the medial right breast/chest wall consistent with recurrent disease, no other  hypermetabolic metastases identified.   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 She presents to the clinic today for a follow-up. Pt reports that she has been doing good. Right arm lymphedema. Continues to have slight swelling of the arm. She uses sleeve. She has had some swelling in ankles. More on the right side. She does walk a mile everyday for exercise.   ALLERGIES:  has No Known Allergies.  MEDICATIONS:  Current Outpatient Medications  Medication Sig Dispense Refill   Calcium 500-2.5 MG-MCG CHEW      Cholecalciferol (VITAMIN D) 10 MCG/ML LIQD      CRESTOR 20 MG tablet Take 20 mg by mouth in the morning.  0   estradiol (ESTRACE) 0.1 MG/GM vaginal cream Use 1 gram vaginally 2 times weekly at bedtime 42.5 g 1   hydrochlorothiazide (HYDRODIURIL) 12.5 MG tablet Take 12.5 mg by mouth in the morning.     losartan (COZAAR) 100 MG tablet Take 100 mg by mouth in the morning.  0  Multiple Vitamin (MULTIVITAMIN) tablet Take 1 tablet by mouth daily.     Polyethyl Glyc-Propyl Glyc PF (SYSTANE PRESERVATIVE FREE) 0.4-0.3 % SOLN Place 1-2 drops into both eyes 3 (three) times daily as needed (dry/irritated eyes).     Vibegron (GEMTESA) 75 MG TABS 1 tablet Orally Once a day     No current facility-administered medications for this  visit.    PHYSICAL EXAMINATION: ECOG PERFORMANCE STATUS: 1 - Symptomatic but completely ambulatory  Vitals:   08/01/22 1056 08/01/22 1057  BP: (!) 168/80 (!) 160/79  Pulse: 66   Resp: 18   Temp: 97.9 F (36.6 C)   SpO2: 100%    Filed Weights   08/01/22 1056  Weight: 147 lb 8 oz (66.9 kg)    BREAST: Right mastectomy scar appears to be extremely tight and uncomfortable.  No palpable lumps or nodules in the left breast or right chest wall or axilla.. (exam performed in the presence of a chaperone)  LABORATORY DATA:  I have reviewed the data as listed    Latest Ref Rng & Units 05/05/2021    3:46 AM 04/22/2021   11:22 AM 05/14/2020    9:33 AM  CMP  Glucose 70 - 99 mg/dL 960  85  79   BUN 8 - 23 mg/dL 28  28  29    Creatinine 0.44 - 1.00 mg/dL 4.54  0.98  1.19   Sodium 135 - 145 mmol/L 135  138  139   Potassium 3.5 - 5.1 mmol/L 3.8  3.9  3.8   Chloride 98 - 111 mmol/L 106  106  104   CO2 22 - 32 mmol/L 22  25  25    Calcium 8.9 - 10.3 mg/dL 8.1  9.6  9.3   Total Protein 6.5 - 8.1 g/dL  6.8  6.5   Total Bilirubin 0.3 - 1.2 mg/dL  0.5  0.8   Alkaline Phos 38 - 126 U/L  78  83   AST 15 - 41 U/L  25  17   ALT 0 - 44 U/L  23  14     Lab Results  Component Value Date   WBC 8.2 05/05/2021   HGB 11.2 (L) 05/05/2021   HCT 32.8 (L) 05/05/2021   MCV 96.8 05/05/2021   PLT 165 05/05/2021   NEUTROABS 2.9 05/14/2020    ASSESSMENT & PLAN:  Breast cancer of lower-inner quadrant of right female breast ([facility]) Right lumpectomy 02/03/2015: Invasive ductal carcinoma grade 2, 2.1 cm, with associated DCIS, margins are negative, 0/1 lymph node, ER 0%, PR 0%, HER-2 positive ratio 2.31, T2 N0 stage II a. (Right breast biopsy 3:30 position 12/09/2014: Invasive ductal carcinoma grade 3, ER 0%, PR 0%, HER-2 negative ratio 1.5, Ki-67 90%, 1 cm irregular mass T1C N0 stage IA clinical stage) Genetic counseling revealed NBN mutation   2. Adjuvant chemotherapy with TCH 6 cycles started 03/09/15  completed 06/22/15 took Herceptin maintenance for 1 year until 02/15/2016 3. Followed by radiation therapy 08/11/2015 to 09/22/2015 4. Cutaneous recurrence: 03/07/2017: Right mastectomy: IDC grade 3, 2 foci spanning 1.2 cm and 1.1 cm, lymphovascular invasion is present including dermal lymphatics, margins negative, 0/2 lymph nodes negative, ER 0%, PR 0%, HER-2 negative, Ki-67 not done,pT4b (skin involvement) N0 stage IIIc PET/CT 02/16/2017: No metastatic disease ----------------------------------------------------------------------------------------------- Patient decided not to receive any further systemic chemotherapy. There is no role of antiestrogen therapy because she is triple negative.   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  Continues to have slight swelling of the arm.  Uses sleeve. Bilateral lower extremity edema left greater than right: Mild   Return to clinic on an as-needed basis.    No orders of the defined types were placed in this encounter.  The patient has a good understanding of the overall plan. she agrees with it. she will call with any problems that may develop before the next visit here. Total time spent: 30 mins including face to face time and time spent for planning, charting and co-ordination of care   Tamsen Meek, MD 08/01/22    I Janan Ridge am acting as a Neurosurgeon for [employer]  I have reviewed the above documentation for accuracy and completeness, and I agree with the above.

## 2022-08-01 ENCOUNTER — Inpatient Hospital Stay: Payer: Medicare Other | Attending: Hematology and Oncology | Admitting: Hematology and Oncology

## 2022-08-01 ENCOUNTER — Other Ambulatory Visit: Payer: Self-pay

## 2022-08-01 VITALS — BP 160/79 | HR 66 | Temp 97.9°F | Resp 18 | Ht 68.0 in | Wt 147.5 lb

## 2022-08-01 DIAGNOSIS — Z171 Estrogen receptor negative status [ER-]: Secondary | ICD-10-CM

## 2022-08-01 DIAGNOSIS — Z853 Personal history of malignant neoplasm of breast: Secondary | ICD-10-CM | POA: Insufficient documentation

## 2022-08-01 DIAGNOSIS — Z9221 Personal history of antineoplastic chemotherapy: Secondary | ICD-10-CM | POA: Diagnosis not present

## 2022-08-01 DIAGNOSIS — C50311 Malignant neoplasm of lower-inner quadrant of right female breast: Secondary | ICD-10-CM | POA: Diagnosis not present

## 2022-08-01 DIAGNOSIS — Z923 Personal history of irradiation: Secondary | ICD-10-CM | POA: Diagnosis not present

## 2022-08-01 DIAGNOSIS — Z9011 Acquired absence of right breast and nipple: Secondary | ICD-10-CM | POA: Insufficient documentation

## 2022-08-01 NOTE — Assessment & Plan Note (Signed)
Right lumpectomy 02/03/2015: Invasive ductal carcinoma grade 2, 2.1 cm, with associated DCIS, margins are negative, 0/1 lymph node, ER 0%, PR 0%, HER-2 positive ratio 2.31, T2 N0 stage II a. (Right breast biopsy 3:30 position 12/09/2014: Invasive ductal carcinoma grade 3, ER 0%, PR 0%, HER-2 negative ratio 1.5, Ki-67 90%, 1 cm irregular mass T1C N0 stage IA clinical stage) Genetic counseling revealed NBN mutation   2. Adjuvant chemotherapy with TCH 6 cycles started 03/09/15 completed 06/22/15 took Herceptin maintenance for 1 year until 02/15/2016 3. Followed by radiation therapy 08/11/2015 to 09/22/2015 4. Cutaneous recurrence: 03/07/2017: Right mastectomy: IDC grade 3, 2 foci spanning 1.2 cm and 1.1 cm, lymphovascular invasion is present including dermal lymphatics, margins negative, 0/2 lymph nodes negative, ER 0%, PR 0%, HER-2 negative, Ki-67 not done,pT4b (skin involvement) N0 stage IIIc PET/CT 02/16/2017: No metastatic disease ----------------------------------------------------------------------------------------------- Patient decided not to receive any further systemic chemotherapy. There is no role of antiestrogen therapy because she is triple negative.   Chest wall Recurrence: 10/10/17: Chest wall excision: Breast cancer, ER 0%, PR 0%, HER-2 negative Adjuvant radiation therapy 11/14/2017-12/29/2017 Radiation pneumonitis on CT chest 03/05/2018   Breast cancer surveillance: 03/07/2022: Left breast mammogram: Benign, breast density category C at [facility] 08/01/2022: Breast exam: Benign, right mastectomy scar tissue intact without any lumps or nodules   Right arm lymphedema:  Had undergone physical therapy.  Continues to have slight swelling of the arm.  Uses sleeve.   Return to clinic in 1 year for follow-up

## 2022-09-23 NOTE — Progress Notes (Signed)
GYNECOLOGY  VISIT   HPI: 82 y.o.   Widowed  Caucasian  female   G2P2002 with Patient's last menstrual period was 01/17/1982 (approximate).   here for   3 mo pessary check.  She has a Gelhorn pessary 2 3/4.   She is followed for a cystocele. Uses vaginal estradiol cream 1 gram vaginally twice a week.  This was increased due to atrophy noted with her exam.  This is her third pessary and it is working well. No discomfort, bleeding, or discharge.   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  Sees Dr. Pamelia Hoit, who has released her from care.   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  Saw Dr. Marlou Porch from urology for urinary incontinence earlier this year.  Taking Gemtesa.  Notes stronger urinary odor and dark color to the urine.  No pain with urination or blood in the urine.  GYNECOLOGIC HISTORY: Patient's last menstrual period was 01/17/1982 (approximate). Contraception:  PMP Menopausal hormone therapy:  estrace Last mammogram:  03/07/22 Breast density Cat C, BI-RADS CAT 1 neg Last pap smear:   02/24/15 neg        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, right (HCC) 02/24/2017   Anemia 11/03/2015   Edema 07/15/2015   Monoallelic mutation of NBN gene    Genetic testing 01/13/2015   Family history of breast cancer    Family history of  colon cancer    Breast cancer of lower-inner quadrant of right female breast (HCC) 12/30/2014   Basal cell carcinoma of skin 10/30/2014   Abnormal results of liver function studies 10/30/2014   Callosity 10/30/2014   Age-related osteoporosis without current pathological fracture 07/31/2008   Essential hypertension 07/31/2008    Past Medical History:  Diagnosis Date   Arthritis    Breast cancer (HCC) 11/2014   Family history of breast cancer    Family history of colon cancer    History of kidney stones    History of radiation therapy 08/11/15- 09/23/2015   Right Breast   History of radiation therapy 11/13/17- 12/29/17   Right chest wall and IM nodes 50.04 Gy in 28 fractions, Right supraclavicular and PAB nodes 50.04 Gy in 28 fractions, Right Chest wall boost/ 10 Gy in 5 fractions.    Hyperlipidemia    Hypertension    Monoallelic mutation of NBN gene    Osteoporosis    Pessary maintenance    Pneumonia    Skin cancer of nose     Past Surgical History:  Procedure Laterality Date   APPENDECTOMY  bilateral cataract surgery      BREAST LUMPECTOMY WITH RADIOACTIVE SEED AND SENTINEL LYMPH NODE BIOPSY Right 02/03/2015   Procedure: RIGHT BREAST LUMPECTOMY WITH RADIOACTIVE SEED AND RIGHT SENTINEL LYMPH NODE BIOPSY;  Surgeon: Ovidio Kin, MD;  Location: [facility];  Service: General;  Laterality: Right;   BREAST SURGERY  11/2012   biopsy, benign breast tissue   COLONOSCOPY  06/2010   rec   LITHOTRIPSY     kidney stone on left side   MASTECTOMY W/ SENTINEL NODE BIOPSY Right 03/07/2017   Procedure: RIGHT TOTAL MASTECTOMY WITH RIGHT AXILLARY SENTINEL LYMPH NODE BIOPSY;  Surgeon: Ovidio Kin, MD;  Location: [facility];  Service: General;  Laterality: Right;   OVARIAN CYST REMOVAL  age 74   POLYPECTOMY  04/2005   colon   PORTACATH PLACEMENT Left 02/03/2015   Procedure: INSERTION PORT-A-CATH WITH ULTRA SOUND GUIDANCE;  Surgeon: Ovidio Kin, MD;  Location: [facility];  Service: General;  Laterality: Left;   portacath removal      reexcision  10/09/2017   Dr. Ezzard Standing, chest wall excision for recurrent breast cancer.    TOTAL KNEE ARTHROPLASTY Right 05/04/2021   Procedure: TOTAL KNEE ARTHROPLASTY;  Surgeon: Durene Romans, MD;  Location: [facility];  Service: Orthopedics;  Laterality: Right;    Current Outpatient Medications  Medication Sig Dispense Refill   Calcium 500-2.5 MG-MCG CHEW      Cholecalciferol (VITAMIN D) 10 MCG/ML LIQD      CRESTOR 20 MG tablet Take 20 mg by mouth in the morning.  0   estradiol (ESTRACE) 0.1 MG/GM vaginal cream Use 1 gram vaginally 2 times weekly at bedtime 42.5 g 1   hydrochlorothiazide (HYDRODIURIL) 12.5 MG tablet Take 12.5 mg by mouth in the morning.     losartan (COZAAR) 100 MG tablet Take 100 mg by mouth in the morning.  0   Multiple Vitamin (MULTIVITAMIN) tablet Take 1 tablet by mouth daily.     Polyethyl Glyc-Propyl Glyc PF (SYSTANE PRESERVATIVE FREE) 0.4-0.3 % SOLN Place 1-2 drops into both eyes 3 (three) times daily as needed (dry/irritated eyes).     Vibegron (GEMTESA) 75 MG TABS 1 tablet Orally Once a day     No current facility-administered medications for this visit.     ALLERGIES: Patient has no known allergies.  Family History  Problem Relation Age of Onset   Colon cancer Sister 66       died at 51   Breast cancer Mother 34   Hypertension Mother    Osteoporosis Mother    Heart disease Father        CABG   Dementia Father    Throat cancer Brother 68   Breast cancer Sister 68   Lymphoma Sister 75   Melanoma Sister    Skin cancer Brother    Skin cancer Daughter     Social History   Socioeconomic History   Marital status: Widowed    Spouse name: Not on file   Number of children: 2   Years of education: Not on file   Highest education level: Not on file  Occupational History   Not on file  Tobacco Use   Smoking status: Never   Smokeless tobacco: Never  Vaping Use   Vaping status:  Never Used  Substance and Sexual Activity   Alcohol use: Yes    Comment: rarely   Drug use: No   Sexual activity: Not Currently    Partners: Male  Birth control/protection: Post-menopausal  Other Topics Concern   Not on file  Social History Narrative   Not on file   Social Determinants of Health   Financial Resource Strain: Not on file  Food Insecurity: Not on file  Transportation Needs: No Transportation Needs (11/07/2017)   PRAPARE - Administrator, Civil Service (Medical): No    Lack of Transportation (Non-Medical): No  Physical Activity: Not on file  Stress: Not on file  Social Connections: Not on file  Intimate Partner Violence: Unknown (09/25/2018)   Humiliation, Afraid, Rape, and Kick questionnaire    Fear of Current or Ex-Partner: Not asked    Emotionally Abused: Not asked    Physically Abused: Not asked    Sexually Abused: Not asked    Review of Systems  All other systems reviewed and are negative.   PHYSICAL EXAMINATION:    BP 132/80 (BP Location: Left Arm, Patient Position: Sitting, Cuff Size: Normal)   Pulse 73   Ht 5\' 8"  (1.727 m)   Wt 140 lb (63.5 kg)   LMP 01/17/1982 (Approximate)   SpO2 98%   BMI 21.29 kg/m     General appearance: alert, cooperative and appears stated age   Pelvic: External genitalia:  no lesions              Urethra:  normal appearing urethra with no masses, tenderness or lesions              Bartholins and Skenes: normal                 Vagina: normal appearing vagina with normal color and discharge, no lesions              Cervix: no lesions                Bimanual Exam:  Uterus:  normal size, contour, position, consistency, mobility, non-tender              Adnexa: no mass, fullness, tenderness           Gelhorn pessary removed, cleansed and replaced with estradiol cream 1 gram on pessary.   Chaperone was present for exam:  Silas Flood, CMA  ASSESSMENT  Status post right mastectomy for breast cancer  recurrence.  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  NBN gene mutation, one copy, which increases of breast cancer.  Cystocele.   Pessary maintenance.   Overactive bladder.  On Gemtesa.   PLAN  Continue use of Gelhorn pessary, 2 3/4.  We discussed the potential effect of reducing the use of the vaginal estrogen cream - discharge, bleeding, vaginal ulceration, and potential need to leave pessary out for a period of time.  She will continue 1 gram twice a week for now.  Fu for pessary check in 3 months.    35 min  total time was spent for this patient encounter, including preparation, face-to-face counseling with the patient, coordination of care, and documentation of the encounter.

## 2022-10-03 ENCOUNTER — Encounter: Payer: Self-pay | Admitting: Obstetrics and Gynecology

## 2022-10-03 ENCOUNTER — Ambulatory Visit (INDEPENDENT_AMBULATORY_CARE_PROVIDER_SITE_OTHER): Payer: Medicare Other | Admitting: Obstetrics and Gynecology

## 2022-10-03 VITALS — BP 132/80 | HR 73 | Ht 68.0 in | Wt 140.0 lb

## 2022-10-03 DIAGNOSIS — Z853 Personal history of malignant neoplasm of breast: Secondary | ICD-10-CM | POA: Diagnosis not present

## 2022-10-03 DIAGNOSIS — N8111 Cystocele, midline: Secondary | ICD-10-CM | POA: Diagnosis not present

## 2022-10-03 DIAGNOSIS — Z4689 Encounter for fitting and adjustment of other specified devices: Secondary | ICD-10-CM

## 2022-11-05 DIAGNOSIS — Z23 Encounter for immunization: Secondary | ICD-10-CM | POA: Diagnosis not present

## 2022-12-19 NOTE — Progress Notes (Signed)
GYNECOLOGY  VISIT   HPI: 82 y.o.   Widowed  Caucasian female   G2P2002 with Patient's last menstrual period was 01/17/1982 (approximate).   here for: 3 mo pessary check-- feels like uterus is pushing around pessary.  Using a Gelhorn pessary.  Feels her uterus dropping around the pessary, and she pushes it back up.  Doing ok with this overall.   No pain. No bleeding or discharge.  Voiding well.   Has some accidental leakage of stool. This occurs periodically.   Has done pelvic floor therapy for urinary incontinence only through [facility].  She declines referral back to them for PT.   Sees Dr. Marlou Porch, urology, for [facility].   Needs refill of vaginal estrogen cream.  Has estrogen receptor negative breast cancer.   GYNECOLOGIC HISTORY: Patient's last menstrual period was 01/17/1982 (approximate). Contraception:  PMP Menopausal hormone therapy:  estrace Last 2 paps:  02/24/15 neg  History of abnormal Pap or positive HPV:  no Mammogram:  03/07/22 Breast density Cat C, BI-RADS CAT 1 neg         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, right (HCC) 02/24/2017   Anemia 11/03/2015   Edema 07/15/2015   Monoallelic mutation of NBN gene    Genetic testing 01/13/2015   Family history of breast cancer    Family history of colon cancer    Breast cancer of lower-inner quadrant of right  female breast (HCC) 12/30/2014   Basal cell carcinoma of skin 10/30/2014   Abnormal results of liver function studies 10/30/2014   Callosity 10/30/2014   Age-related osteoporosis without current pathological fracture 07/31/2008   Essential hypertension 07/31/2008    Past Medical History:  Diagnosis Date   Arthritis    Breast cancer (HCC) 11/2014   Family history of breast cancer    Family history of colon cancer    History of kidney stones    History of radiation therapy 08/11/15- 09/23/2015   Right Breast   History of radiation therapy 11/13/17- 12/29/17   Right chest wall and IM nodes 50.04 Gy in 28 fractions, Right supraclavicular and PAB nodes 50.04 Gy in 28 fractions, Right Chest wall boost/ 10 Gy in 5 fractions.    Hyperlipidemia    Hypertension    Monoallelic mutation of NBN gene    Osteoporosis    Pessary maintenance    Pneumonia    Skin cancer of nose     Past Surgical History:  Procedure Laterality Date   APPENDECTOMY     bilateral cataract surgery      BREAST LUMPECTOMY WITH RADIOACTIVE SEED AND SENTINEL LYMPH NODE  BIOPSY Right 02/03/2015   Procedure: RIGHT BREAST LUMPECTOMY WITH RADIOACTIVE SEED AND RIGHT SENTINEL LYMPH NODE BIOPSY;  Surgeon: Ovidio Kin, MD;  Location: [facility];  Service: General;  Laterality: Right;   BREAST SURGERY  11/2012   biopsy, benign breast tissue   COLONOSCOPY  06/2010   rec   LITHOTRIPSY     kidney stone on left side   MASTECTOMY W/ SENTINEL NODE BIOPSY Right 03/07/2017   Procedure: RIGHT TOTAL MASTECTOMY WITH RIGHT AXILLARY SENTINEL LYMPH NODE BIOPSY;  Surgeon: Ovidio Kin, MD;  Location: [facility];  Service: General;  Laterality: Right;   OVARIAN CYST REMOVAL  age 66   POLYPECTOMY  04/2005   colon   PORTACATH PLACEMENT Left 02/03/2015   Procedure: INSERTION PORT-A-CATH WITH ULTRA SOUND GUIDANCE;  Surgeon: Ovidio Kin, MD;  Location:  SURGERY CENTER;  Service: General;  Laterality: Left;   portacath  removal      reexcision  10/09/2017   Dr. Ezzard Standing, chest wall excision for recurrent breast cancer.    TOTAL KNEE ARTHROPLASTY Right 05/04/2021   Procedure: TOTAL KNEE ARTHROPLASTY;  Surgeon: Durene Romans, MD;  Location: [facility];  Service: Orthopedics;  Laterality: Right;    Current Outpatient Medications  Medication Sig Dispense Refill   Calcium 500-2.5 MG-MCG CHEW      Cholecalciferol (VITAMIN D) 10 MCG/ML LIQD      CRESTOR 20 MG tablet Take 20 mg by mouth in the morning.  0   hydrochlorothiazide (HYDRODIURIL) 12.5 MG tablet Take 12.5 mg by mouth in the morning.     losartan (COZAAR) 100 MG tablet Take 100 mg by mouth in the morning.  0   Multiple Vitamin (MULTIVITAMIN) tablet Take 1 tablet by mouth daily.     Polyethyl Glyc-Propyl Glyc PF (SYSTANE PRESERVATIVE FREE) 0.4-0.3 % SOLN Place 1-2 drops into both eyes 3 (three) times daily as needed (dry/irritated eyes).     Vibegron (GEMTESA) 75 MG TABS 1 tablet Orally Once a day     estradiol (ESTRACE) 0.1 MG/GM vaginal cream Use 1 gram vaginally 2 times weekly at bedtime 42.5 g 0   No current facility-administered medications for this visit.     ALLERGIES: Patient has no known allergies.  Family History  Problem Relation Age of Onset   Colon cancer Sister 16       died at 32   Breast cancer Mother 58   Hypertension Mother    Osteoporosis Mother    Heart disease Father        CABG   Dementia Father    Throat cancer Brother 48   Breast cancer Sister 57   Lymphoma Sister 74   Melanoma Sister    Skin cancer Brother    Skin cancer Daughter     Social History   Socioeconomic History   Marital status: Widowed    Spouse name: Not on file   Number of children: 2   Years of education: Not on file   Highest education level: Not on file  Occupational History   Not on file  Tobacco Use   Smoking status: Never   Smokeless tobacco: Never  Vaping Use   Vaping status: Never Used  Substance and Sexual Activity   Alcohol use: Yes     Comment: rarely   Drug use: No   Sexual activity: Not Currently    Partners: Male    Birth control/protection: Post-menopausal  Other Topics Concern   Not on file  Social History Narrative  Not on file   Social Drivers of Health   Financial Resource Strain: Not on file  Food Insecurity: Not on file  Transportation Needs: No Transportation Needs (11/07/2017)   PRAPARE - Administrator, Civil Service (Medical): No    Lack of Transportation (Non-Medical): No  Physical Activity: Not on file  Stress: Not on file  Social Connections: Not on file  Intimate Partner Violence: Unknown (09/25/2018)   Humiliation, Afraid, Rape, and Kick questionnaire    Fear of Current or Ex-Partner: Not asked    Emotionally Abused: Not asked    Physically Abused: Not asked    Sexually Abused: Not asked    Review of Systems  All other systems reviewed and are negative.   PHYSICAL EXAMINATION:   BP 126/80 (BP Location: Left Arm, Patient Position: Sitting, Cuff Size: Small)   Pulse 76   Ht 5\' 8"  (1.727 m)   Wt 148 lb (67.1 kg)   LMP 01/17/1982 (Approximate)   SpO2 100%   BMI 22.50 kg/m     General appearance: alert, cooperative and appears stated age  Pelvic: External genitalia:  no lesions              Urethra:  normal appearing urethra with no masses, tenderness or lesions              Bartholins and Skenes: normal                 Vagina: normal appearing vagina with normal color and discharge, no lesions              Cervix: no lesions                Bimanual Exam:  Uterus:  normal size, contour, position, consistency, mobility, non-tender              Adnexa: no mass, fullness, tenderness     Pessary removed, cleansed, and replaced.   Chaperone was present for exam:  Warren Lacy, CMA  ASSESSMENT:  Status post right mastectomy for breast cancer recurrence.  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  NBN gene mutation, one copy, which increases of breast  cancer.  Cystocele.   Pessary maintenance.  Encounter for medication monitoring.   Overactive bladder.  On Gemtesa.   PLAN:  Continue current pessary care.  Refill of vaginal estradiol cream.   Next pessary check in 3 months.  Return also for breast and pelvic exam with pessary check in 6 months.   23 min  total time was spent for this patient encounter, including preparation, face-to-face counseling with the patient, coordination of care, and documentation of the encounter.

## 2022-12-20 DIAGNOSIS — I10 Essential (primary) hypertension: Secondary | ICD-10-CM | POA: Diagnosis not present

## 2022-12-20 DIAGNOSIS — E785 Hyperlipidemia, unspecified: Secondary | ICD-10-CM | POA: Diagnosis not present

## 2022-12-20 DIAGNOSIS — E559 Vitamin D deficiency, unspecified: Secondary | ICD-10-CM | POA: Diagnosis not present

## 2022-12-27 DIAGNOSIS — R82998 Other abnormal findings in urine: Secondary | ICD-10-CM | POA: Diagnosis not present

## 2022-12-27 DIAGNOSIS — I10 Essential (primary) hypertension: Secondary | ICD-10-CM | POA: Diagnosis not present

## 2022-12-27 DIAGNOSIS — I972 Postmastectomy lymphedema syndrome: Secondary | ICD-10-CM | POA: Diagnosis not present

## 2022-12-27 DIAGNOSIS — Z1331 Encounter for screening for depression: Secondary | ICD-10-CM | POA: Diagnosis not present

## 2022-12-27 DIAGNOSIS — Z1389 Encounter for screening for other disorder: Secondary | ICD-10-CM | POA: Diagnosis not present

## 2022-12-27 DIAGNOSIS — Z0000 Encounter for general adult medical examination without abnormal findings: Secondary | ICD-10-CM | POA: Diagnosis not present

## 2023-01-02 ENCOUNTER — Encounter: Payer: Self-pay | Admitting: Obstetrics and Gynecology

## 2023-01-02 ENCOUNTER — Ambulatory Visit (INDEPENDENT_AMBULATORY_CARE_PROVIDER_SITE_OTHER): Payer: Medicare Other | Admitting: Obstetrics and Gynecology

## 2023-01-02 VITALS — BP 126/80 | HR 76 | Ht 68.0 in | Wt 148.0 lb

## 2023-01-02 DIAGNOSIS — Z5181 Encounter for therapeutic drug level monitoring: Secondary | ICD-10-CM | POA: Diagnosis not present

## 2023-01-02 DIAGNOSIS — Z4689 Encounter for fitting and adjustment of other specified devices: Secondary | ICD-10-CM

## 2023-01-02 MED ORDER — ESTRADIOL 0.1 MG/GM VA CREA: TOPICAL_CREAM | VAGINAL | 0 refills | Status: DC

## 2023-01-16 ENCOUNTER — Ambulatory Visit: Payer: Medicare Other | Admitting: Podiatry

## 2023-01-16 ENCOUNTER — Encounter: Payer: Self-pay | Admitting: Podiatry

## 2023-01-16 DIAGNOSIS — M7752 Other enthesopathy of left foot: Secondary | ICD-10-CM | POA: Diagnosis not present

## 2023-01-16 DIAGNOSIS — G629 Polyneuropathy, unspecified: Secondary | ICD-10-CM | POA: Diagnosis not present

## 2023-01-16 DIAGNOSIS — L84 Corns and callosities: Secondary | ICD-10-CM | POA: Diagnosis not present

## 2023-01-16 MED ORDER — TRIAMCINOLONE ACETONIDE 10 MG/ML IJ SUSP
10.0000 mg | Freq: Once | INTRAMUSCULAR | Status: AC
  Administered 2023-01-16: 10 mg via INTRA_ARTICULAR

## 2023-01-16 NOTE — Progress Notes (Signed)
Subjective:   Patient ID: Shawna [name], female   DOB: 82 y.o.   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

## 2023-02-06 DIAGNOSIS — H16223 Keratoconjunctivitis sicca, not specified as Sjogren's, bilateral: Secondary | ICD-10-CM | POA: Diagnosis not present

## 2023-02-09 DIAGNOSIS — K080 Exfoliation of teeth due to systemic causes: Secondary | ICD-10-CM | POA: Diagnosis not present

## 2023-03-07 DIAGNOSIS — N3941 Urge incontinence: Secondary | ICD-10-CM | POA: Diagnosis not present

## 2023-03-07 DIAGNOSIS — N3281 Overactive bladder: Secondary | ICD-10-CM | POA: Diagnosis not present

## 2023-03-13 DIAGNOSIS — Z1231 Encounter for screening mammogram for malignant neoplasm of breast: Secondary | ICD-10-CM | POA: Diagnosis not present

## 2023-03-15 ENCOUNTER — Encounter: Payer: Self-pay | Admitting: Obstetrics and Gynecology

## 2023-03-20 NOTE — Progress Notes (Signed)
 GYNECOLOGY  VISIT   HPI: 83 y.o.   Widowed  Caucasian female   G2P2002 with Patient's last menstrual period was 01/17/1982 (approximate).   here for: 3 mo pessary check.  Using a Gelhorn pessary, 2 and 3/4 inches.     Feels she is doing better with her pessary.   No vaginal discharge or bleeding.   Using vaginal estrogen cream twice a week, 1 gram twice weekly.    Wants a skin check for an area on her chest that appeared 3 weeks ago.  No itching.  Not using any medication on it.    GYNECOLOGIC HISTORY: Patient's last menstrual period was 01/17/1982 (approximate). Contraception:  PMP Menopausal hormone therapy:  estrace Last 2 paps:  02/24/15 neg History of abnormal Pap or positive HPV:  no Mammogram:  03/13/23 Breast density Cat C, BI-RADS CAT 1 neg         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, right (HCC) 02/24/2017   Anemia 11/03/2015   Edema 07/15/2015   Monoallelic mutation of NBN gene    Genetic testing 01/13/2015   Family history of breast cancer    Family history of colon cancer    Breast cancer of lower-inner quadrant of right female breast (HCC) 12/30/2014   Basal cell carcinoma of skin 10/30/2014   Abnormal results of liver function studies 10/30/2014   Callosity 10/30/2014   Age-related osteoporosis without current pathological fracture 07/31/2008   Essential  hypertension 07/31/2008    Past Medical History:  Diagnosis Date   Arthritis    Breast cancer (HCC) 11/2014   Family history of breast cancer    Family history of colon cancer    History of kidney stones    History of radiation therapy 08/11/15- 09/23/2015   Right Breast   History of radiation therapy 11/13/17- 12/29/17   Right chest wall and IM nodes 50.04 Gy in 28 fractions, Right supraclavicular and PAB nodes 50.04 Gy in 28 fractions, Right Chest wall boost/ 10 Gy in 5 fractions.    Hyperlipidemia    Hypertension    Monoallelic mutation of NBN gene    Osteoporosis    Pessary maintenance    Pneumonia    Skin cancer of nose     Past Surgical History:  Procedure Laterality Date   APPENDECTOMY     bilateral cataract surgery      BREAST LUMPECTOMY WITH RADIOACTIVE SEED AND SENTINEL LYMPH NODE BIOPSY Right 02/03/2015   Procedure: RIGHT BREAST LUMPECTOMY WITH RADIOACTIVE SEED AND RIGHT SENTINEL LYMPH NODE BIOPSY;  Surgeon: Ovidio Kin, MD;  Location: [facility];  Service:  General;  Laterality: Right;   BREAST SURGERY  11/2012   biopsy, benign breast tissue   COLONOSCOPY  06/2010   rec   LITHOTRIPSY     kidney stone on left side   MASTECTOMY W/ SENTINEL NODE BIOPSY Right 03/07/2017   Procedure: RIGHT TOTAL MASTECTOMY WITH RIGHT AXILLARY SENTINEL LYMPH NODE BIOPSY;  Surgeon: Ovidio Kin, MD;  Location: [facility] OR;  Service: General;  Laterality: Right;   OVARIAN CYST REMOVAL  age 32   POLYPECTOMY  04/2005   colon   PORTACATH PLACEMENT Left 02/03/2015   Procedure: INSERTION PORT-A-CATH WITH ULTRA SOUND GUIDANCE;  Surgeon: Ovidio Kin, MD;  Location: [facility];  Service: General;  Laterality: Left;   portacath removal      reexcision  10/09/2017   Dr. Ezzard Standing, chest wall excision for recurrent breast cancer.    TOTAL KNEE ARTHROPLASTY Right 05/04/2021   Procedure: TOTAL KNEE ARTHROPLASTY;  Surgeon: Durene Romans, MD;  Location: [facility];  Service:  Orthopedics;  Laterality: Right;    Current Outpatient Medications  Medication Sig Dispense Refill   Calcium 500-2.5 MG-MCG CHEW      Cholecalciferol (VITAMIN D) 10 MCG/ML LIQD      CRESTOR 20 MG tablet Take 20 mg by mouth in the morning.  0   estradiol (ESTRACE) 0.1 MG/GM vaginal cream USE 1 GRAM VAGINALLY 2 TIMES WEEKLY AT BEDTIME 42.5 g 0   hydrochlorothiazide (HYDRODIURIL) 12.5 MG tablet Take 12.5 mg by mouth in the morning.     losartan (COZAAR) 100 MG tablet Take 100 mg by mouth in the morning.  0   Multiple Vitamin (MULTIVITAMIN) tablet Take 1 tablet by mouth daily.     Polyethyl Glyc-Propyl Glyc PF (SYSTANE PRESERVATIVE FREE) 0.4-0.3 % SOLN Place 1-2 drops into both eyes 3 (three) times daily as needed (dry/irritated eyes).     Vibegron (GEMTESA) 75 MG TABS 1 tablet Orally Once a day     No current facility-administered medications for this visit.     ALLERGIES: Patient has no known allergies.  Family History  Problem Relation Age of Onset   Colon cancer Sister 17       died at 49   Breast cancer Mother 2   Hypertension Mother    Osteoporosis Mother    Heart disease Father        CABG   Dementia Father    Throat cancer Brother 26   Breast cancer Sister 70   Lymphoma Sister 87   Melanoma Sister    Skin cancer Brother    Skin cancer Daughter     Social History   Socioeconomic History   Marital status: Widowed    Spouse name: Not on file   Number of children: 2   Years of education: Not on file   Highest education level: Not on file  Occupational History   Not on file  Tobacco Use   Smoking status: Never   Smokeless tobacco: Never  Vaping Use   Vaping status: Never Used  Substance and Sexual Activity   Alcohol use: Yes    Comment: rarely   Drug use: No   Sexual activity: Not Currently    Partners: Male    Birth control/protection: Post-menopausal  Other Topics Concern   Not on file  Social History Narrative   Not on file   Social Drivers of  Health   Financial Resource Strain: Not on file  Food Insecurity: Not on file  Transportation Needs: No Transportation Needs (11/07/2017)  PRAPARE - Administrator, Civil Service (Medical): No    Lack of Transportation (Non-Medical): No  Physical Activity: Not on file  Stress: Not on file  Social Connections: Not on file  Intimate Partner Violence: Unknown (09/25/2018)   Humiliation, Afraid, Rape, and Kick questionnaire    Fear of Current or Ex-Partner: Not asked    Emotionally Abused: Not asked    Physically Abused: Not asked    Sexually Abused: Not asked    Review of Systems  All other systems reviewed and are negative.   PHYSICAL EXAMINATION:   BP 128/84 (BP Location: Left Arm, Patient Position: Sitting, Cuff Size: Small)   Pulse 73   Ht 5\' 8"  (1.727 m)   Wt 150 lb (68 kg)   LMP 01/17/1982 (Approximate)   SpO2 100%   BMI 22.81 kg/m     General appearance: alert, cooperative and appears stated age  Skin: midline chest skin with 5 - 6 mm vesicular skin lesion, nontender.   Pelvic: External genitalia:  no lesions              Urethra:  normal appearing urethra with no masses, tenderness or lesions              Bartholins and Skenes: normal                 Vagina: normal appearing vagina with normal color and discharge, no lesions.  Third degree cystocele, first degree uterine prolapse.               Cervix: no lesions                Bimanual Exam:  Uterus:  normal size, contour, position, consistency, mobility, non-tender              Adnexa: no mass, fullness, tenderness       Gelhorn pessary removed, cleansed, and replaced.   Chaperone was present for exam:  Warren Lacy, CMA  ASSESSMENT:  Status post right mastectomy for breast cancer recurrence.  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  NBN gene mutation, one copy, which increases of breast cancer.  Cystocele and uterine prolapse.  Pessary maintenance.  Hx overactive bladder.    Skin  lesion of chest wall.   PLAN:  Continue pessary care.  Continue vaginal estrogen use.  FU for breast and pelvic exam and pessary check in June.  She will contact her dermatologist to make an appointment.   20 imin  total time was spent for this patient encounter, including preparation, face-to-face counseling with the patient, coordination of care, and documentation of the encounter.

## 2023-03-30 ENCOUNTER — Other Ambulatory Visit: Payer: Self-pay | Admitting: Obstetrics and Gynecology

## 2023-03-30 NOTE — Telephone Encounter (Signed)
 Medication refill request: estrace vaginal cream Last AEX:  11-25-20 Next AEX: 07-03-23 Last MMG (if hormonal medication request): 03-13-23 category c density birads 1:neg Refill authorized: please approve or deny as appropriate

## 2023-04-03 ENCOUNTER — Ambulatory Visit (INDEPENDENT_AMBULATORY_CARE_PROVIDER_SITE_OTHER): Payer: Medicare Other | Admitting: Obstetrics and Gynecology

## 2023-04-03 ENCOUNTER — Encounter: Payer: Self-pay | Admitting: Obstetrics and Gynecology

## 2023-04-03 VITALS — BP 128/84 | HR 73 | Ht 68.0 in | Wt 150.0 lb

## 2023-04-03 DIAGNOSIS — L989 Disorder of the skin and subcutaneous tissue, unspecified: Secondary | ICD-10-CM | POA: Diagnosis not present

## 2023-04-03 DIAGNOSIS — Z4689 Encounter for fitting and adjustment of other specified devices: Secondary | ICD-10-CM | POA: Diagnosis not present

## 2023-04-03 DIAGNOSIS — N814 Uterovaginal prolapse, unspecified: Secondary | ICD-10-CM

## 2023-04-03 DIAGNOSIS — N8111 Cystocele, midline: Secondary | ICD-10-CM

## 2023-04-10 DIAGNOSIS — D485 Neoplasm of uncertain behavior of skin: Secondary | ICD-10-CM | POA: Diagnosis not present

## 2023-04-10 DIAGNOSIS — C44591 Other specified malignant neoplasm of skin of breast: Secondary | ICD-10-CM | POA: Diagnosis not present

## 2023-04-24 ENCOUNTER — Telehealth: Payer: Self-pay

## 2023-04-24 ENCOUNTER — Encounter (HOSPITAL_COMMUNITY)
Admission: RE | Admit: 2023-04-24 | Discharge: 2023-04-24 | Disposition: A | Discharge status: Home / Self Care | Source: Ambulatory Visit | Attending: Hematology and Oncology | Admitting: Hematology and Oncology

## 2023-04-24 ENCOUNTER — Inpatient Hospital Stay: Attending: Hematology and Oncology | Admitting: Hematology and Oncology

## 2023-04-24 VITALS — BP 163/80 | HR 75 | Temp 96.5°F | Resp 16 | Ht 68.0 in | Wt 151.1 lb

## 2023-04-24 DIAGNOSIS — C50919 Malignant neoplasm of unspecified site of unspecified female breast: Secondary | ICD-10-CM | POA: Diagnosis not present

## 2023-04-24 DIAGNOSIS — Z171 Estrogen receptor negative status [ER-]: Secondary | ICD-10-CM | POA: Insufficient documentation

## 2023-04-24 DIAGNOSIS — Z923 Personal history of irradiation: Secondary | ICD-10-CM | POA: Insufficient documentation

## 2023-04-24 DIAGNOSIS — G319 Degenerative disease of nervous system, unspecified: Secondary | ICD-10-CM | POA: Diagnosis not present

## 2023-04-24 DIAGNOSIS — Z853 Personal history of malignant neoplasm of breast: Secondary | ICD-10-CM | POA: Insufficient documentation

## 2023-04-24 DIAGNOSIS — C50311 Malignant neoplasm of lower-inner quadrant of right female breast: Secondary | ICD-10-CM

## 2023-04-24 DIAGNOSIS — F29 Unspecified psychosis not due to a substance or known physiological condition: Secondary | ICD-10-CM | POA: Diagnosis not present

## 2023-04-24 DIAGNOSIS — I6782 Cerebral ischemia: Secondary | ICD-10-CM | POA: Diagnosis not present

## 2023-04-24 DIAGNOSIS — Z9221 Personal history of antineoplastic chemotherapy: Secondary | ICD-10-CM | POA: Insufficient documentation

## 2023-04-24 DIAGNOSIS — Z9011 Acquired absence of right breast and nipple: Secondary | ICD-10-CM | POA: Insufficient documentation

## 2023-04-24 MED ORDER — GADOBUTROL 1 MMOL/ML IV SOLN
6.0000 mL | Freq: Once | INTRAVENOUS | Status: AC | PRN
  Administered 2023-04-24: 6 mL via INTRAVENOUS

## 2023-04-24 NOTE — Progress Notes (Signed)
 I sent a message to Dr. Ivor Messier  Patient Care Team: Creola Corn, MD as PCP - General (Internal Medicine) Ovidio Kin, MD as Consulting Physician (General Surgery) Serena Croissant, MD as Consulting Physician (Hematology and Oncology) Axel Filler Larna Daughters, NP as Nurse Practitioner (Hematology and Oncology) Romualdo Bolk, MD as Consulting Physician (Obstetrics and Gynecology)  DIAGNOSIS:  Encounter Diagnosis  Name Primary?   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 Genes tested include:  ATM, BARD1, BRCA1, BRCA2, BRIP1, CDH1, CHEK2, EPCAM, FANCC, MLH1, MSH2, MSH6, NBN, PALB2, PMS2, PTEN, RAD51C, RAD51D, TP53, and XRCC2.   02/03/2015 Surgery   Right lumpectomy: Invasive ductal carcinoma grade 2, 2.1 cm, with associated DCIS, margins are negative, 0/1 lymph node, ER 0%, PR 0%, HER-2 positive ratio 2.31, T2 N0 stage II a   03/09/2015 - 06/22/2015 Chemotherapy   Adjuvant chemotherapy with TCH 6 followed by Herceptin maintenance for 1 year   08/11/2015 - 09/23/2015 Radiation Therapy   Adjuvant radiation therapy Basilio Cairo):  1) Right Breast / 50 Gy in 25 fractions ; 2) Right Breast Boost / 10 Gy in 5 fractions   01/30/2017 Relapse/Recurrence   Skin biopsy right breast: Metastatic breast cancer GCDFP strongly positive   02/15/2017 PET scan   Hypermetabolic focus of skin thickening along the medial right breast/chest wall consistent with recurrent disease,  no other hypermetabolic metastases identified.   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 She had a mastectomy in 2019 due to a skin recurrence. The patient noticed the current lesion, which is similar to the previous ones. The biopsy confirmed it to be triple negative, estrogen negative, progesterone negative, and HER2 negative, growing at a rate of 70%. The patient also reports a new symptom of smelling a sweet scent, like a scented candle, and hearing a dripping sound in her ear when sleeping on her side. These sensory symptoms are only experienced at home and have been occurring for the past month. She denies any associated headaches, dizziness, blurred vision, or double vision.     ALLERGIES:  has no known allergies.  MEDICATIONS:  Current Outpatient Medications  Medication Sig Dispense Refill   Calcium 500-2.5 MG-MCG CHEW      Cholecalciferol (VITAMIN D) 10 MCG/ML LIQD      CRESTOR 20 MG tablet Take 20 mg by mouth in the morning.  0   estradiol (ESTRACE) 0.1 MG/GM vaginal cream USE  1 GRAM VAGINALLY 2 TIMES WEEKLY AT BEDTIME 42.5 g 0   hydrochlorothiazide (HYDRODIURIL) 12.5 MG tablet Take 12.5 mg by mouth in the morning.     losartan (COZAAR) 100 MG tablet Take 100 mg by mouth in the morning.  0   Multiple Vitamin (MULTIVITAMIN) tablet Take 1 tablet by mouth daily.     Vibegron (GEMTESA) 75 MG TABS 1 tablet  Orally Once a day     Polyethyl Glyc-Propyl Glyc PF (SYSTANE PRESERVATIVE FREE) 0.4-0.3 % SOLN Place 1-2 drops into both eyes 3 (three) times daily as needed (dry/irritated eyes). (Patient not taking: Reported on 04/24/2023)     No current facility-administered medications for this visit.    PHYSICAL EXAMINATION: ECOG PERFORMANCE STATUS: 1 - Symptomatic but completely ambulatory  Vitals:   04/24/23 0941  BP: (!) 163/80  Pulse: 75  Resp: 16  Temp: (!) 96.5 F (35.8 C)  SpO2: 100%   Filed Weights   04/24/23 0941  Weight: 151 lb 1.6 oz (68.5 kg)    Physical Exam   (exam performed in the presence of a chaperone)  LABORATORY DATA:  I have reviewed the data as listed    Latest Ref Rng & Units 05/05/2021    3:46 AM 04/22/2021   11:22 AM 05/14/2020    9:33 AM  CMP  Glucose 70 - 99 mg/dL 161  85  79   BUN 8 - 23 mg/dL 28  28  29    Creatinine 0.44 - 1.00 mg/dL 0.96  0.45  4.09   Sodium 135 - 145 mmol/L 135  138  139   Potassium 3.5 - 5.1 mmol/L 3.8  3.9  3.8   Chloride 98 - 111 mmol/L 106  106  104   CO2 22 - 32 mmol/L 22  25  25    Calcium 8.9 - 10.3 mg/dL 8.1  9.6  9.3   Total Protein 6.5 - 8.1 g/dL  6.8  6.5   Total Bilirubin 0.3 - 1.2 mg/dL  0.5  0.8   Alkaline Phos 38 - 126 U/L  78  83   AST 15 - 41 U/L  25  17   ALT 0 - 44 U/L  23  14     Lab Results  Component Value Date   WBC 8.2 05/05/2021   HGB 11.2 (L) 05/05/2021   HCT 32.8 (L) 05/05/2021   MCV 96.8 05/05/2021   PLT 165 05/05/2021   NEUTROABS 2.9 05/14/2020    ASSESSMENT & PLAN:  Breast cancer of lower-inner quadrant of right female breast (HCC) Right lumpectomy 02/03/2015: Invasive ductal carcinoma grade 2, 2.1 cm, with associated DCIS, margins are negative, 0/1 lymph node, ER 0%, PR 0%, HER-2 positive ratio 2.31, T2 N0 stage II a. (Right breast biopsy 3:30 position 12/09/2014: Invasive ductal carcinoma grade 3, ER 0%, PR 0%, HER-2 negative ratio 1.5, Ki-67 90%, 1 cm irregular mass T1C N0 stage IA clinical  stage) Genetic counseling revealed NBN mutation   2. Adjuvant chemotherapy with TCH 6 cycles started 03/09/15 completed 06/22/15 took Herceptin maintenance for 1 year until 02/15/2016 3. Followed by radiation therapy 08/11/2015 to 09/22/2015 4. Cutaneous recurrence: 03/07/2017: Right mastectomy: IDC grade 3, 2 foci spanning 1.2 cm and 1.1 cm, lymphovascular invasion is present including dermal lymphatics, margins negative, 0/2 lymph nodes negative, ER 0%, PR 0%, HER-2 negative, Ki-67 not done,pT4b (skin involvement) N0 stage IIIc PET/CT 02/16/2017: No metastatic disease ----------------------------------------------------------------------------------------------- Patient decided not to receive any further systemic chemotherapy. There is no role  of antiestrogen therapy because she is triple negative.   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 Yetta Barre skin biopsy right chest wall: Poorly differentiated metastatic breast cancer ER 0%, PR 0%, Ki-67 70%, HER2 0   Unfortunately patient has previously received radiation to the area of the recurrence of the chest wall.  I sent message to Dr. Luane School to evaluate her if she would benefit from additional radiation  Previously patient had decided not to do chemotherapy. I sent a message to Dr. Luisa Hart to evaluate her for surgical resection I ordered a PET CT scan and a brain MRI Brain and brain MRIs because she has been having hallucinations with abnormal sweet smells as well as hearing droplets of water on a constant basis.  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 Local recurrence, not metastatic. Surgery required. Chemotherapy  not recommended due to age and previous decision. Radiation unlikely due to prior complications. PET scan needed to confirm no metastasis before surgery. - Order PET scan to check for metastasis. - Consult with surgeon Dr. Lavenia Atlas for surgical removal. - Consult with radiation oncologist Dr. Basilio Cairo to evaluate further radiation therapy.  Phantosmia and auditory hallucinations Experiences sweet smell and dripping sound, primarily at home. Differential includes recent viral infection, small stroke, or pollen allergies. Further investigation needed due to new cancer finding. - Order MRI of the brain to investigate cause of phantosmia and auditory hallucinations.      Orders Placed This Encounter  Procedures   NM PET Image Restag (PS) Skull Base To Thigh    Standing Status:   Future    Expected Date:   05/01/2023    Expiration Date:   04/23/2024    If indicated for the ordered procedure, I authorize the administration of a radiopharmaceutical per Radiology protocol:   Yes    Preferred imaging location?:   [facility]    Release to patient:   Immediate   MR Brain W Wo Contrast    Standing Status:   Future    Expected Date:   04/24/2023    Expiration Date:   04/23/2024    If indicated for the ordered procedure, I authorize the administration of contrast media per Radiology protocol:   Yes    What is the patient's sedation requirement?:   No Sedation    Does the patient have a pacemaker or implanted devices?:   No    Use SRS Protocol?:   No    Preferred imaging location?:   [facility] (table limit - 550 lbs)    Release to patient:   Immediate   The patient has a good understanding of the overall plan. she agrees with it. she will call with any problems that may develop before the next visit here. Total time spent: 30 mins including face to face time and time spent for planning, charting and co-ordination of care   Tamsen Meek, MD 04/24/23

## 2023-04-24 NOTE — Assessment & Plan Note (Signed)
 Right lumpectomy 02/03/2015: Invasive ductal carcinoma grade 2, 2.1 cm, with associated DCIS, margins are negative, 0/1 lymph node, ER 0%, PR 0%, HER-2 positive ratio 2.31, T2 N0 stage II a. (Right breast biopsy 3:30 position 12/09/2014: Invasive ductal carcinoma grade 3, ER 0%, PR 0%, HER-2 negative ratio 1.5, Ki-67 90%, 1 cm irregular mass T1C N0 stage IA clinical stage) Genetic counseling revealed NBN mutation   2. Adjuvant chemotherapy with TCH 6 cycles started 03/09/15 completed 06/22/15 took Herceptin maintenance for 1 year until 02/15/2016 3. Followed by radiation therapy 08/11/2015 to 09/22/2015 4. Cutaneous recurrence: 03/07/2017: Right mastectomy: IDC grade 3, 2 foci spanning 1.2 cm and 1.1 cm, lymphovascular invasion is present including dermal lymphatics, margins negative, 0/2 lymph nodes negative, ER 0%, PR 0%, HER-2 negative, Ki-67 not done,pT4b (skin involvement) N0 stage IIIc PET/CT 02/16/2017: No metastatic disease ----------------------------------------------------------------------------------------------- Patient decided not to receive any further systemic chemotherapy. There is no role of antiestrogen therapy because she is triple negative.   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  Continues to have slight swelling of the arm.  Uses sleeve. Bilateral lower extremity edema left greater than right: Mild   Return to clinic in 1 year for follow-up

## 2023-04-24 NOTE — Telephone Encounter (Signed)
 Pt scheduled for STAT brain MRI today at [facility] radiology.  She was also scheduled for her PET scan 05/01/23 at 0845. Pt given instructions for NP 6 hours prior to PET, with the exception of plain water with meds. She verbalized understanding of all instructions.

## 2023-04-25 ENCOUNTER — Telehealth: Payer: Self-pay | Admitting: Hematology and Oncology

## 2023-04-25 ENCOUNTER — Encounter: Payer: Self-pay | Admitting: Dermatology

## 2023-04-25 NOTE — Telephone Encounter (Signed)
 I informed the patient of the brain MRI did not show any metastatic disease. Chronic small vessel ischemic changes and moderate brain atrophy are felt to be normal aging changes.

## 2023-04-26 ENCOUNTER — Telehealth: Payer: Self-pay

## 2023-04-26 NOTE — Telephone Encounter (Signed)
 Pt scheduled for MD visit with Dr Pamelia Hoit to review PET results 05/04/23 at 1045. She is agreeable. Scheduled.

## 2023-05-01 ENCOUNTER — Encounter: Payer: Self-pay | Admitting: *Deleted

## 2023-05-01 ENCOUNTER — Encounter (HOSPITAL_COMMUNITY)
Admission: RE | Admit: 2023-05-01 | Discharge: 2023-05-01 | Disposition: A | Discharge status: Home / Self Care | Source: Ambulatory Visit | Attending: Hematology and Oncology | Admitting: Hematology and Oncology

## 2023-05-01 DIAGNOSIS — C50311 Malignant neoplasm of lower-inner quadrant of right female breast: Secondary | ICD-10-CM | POA: Insufficient documentation

## 2023-05-01 DIAGNOSIS — C7981 Secondary malignant neoplasm of breast: Secondary | ICD-10-CM | POA: Diagnosis not present

## 2023-05-01 DIAGNOSIS — Z803 Family history of malignant neoplasm of breast: Secondary | ICD-10-CM

## 2023-05-01 DIAGNOSIS — Z171 Estrogen receptor negative status [ER-]: Secondary | ICD-10-CM | POA: Diagnosis not present

## 2023-05-01 LAB — GLUCOSE, CAPILLARY: Glucose-Capillary: 93 mg/dL (ref 70–99)

## 2023-05-01 MED ORDER — FLUDEOXYGLUCOSE F - 18 (FDG) INJECTION
7.0000 | Freq: Once | INTRAVENOUS | Status: AC | PRN
  Administered 2023-05-01: 7.51 via INTRAVENOUS

## 2023-05-03 ENCOUNTER — Telehealth: Payer: Self-pay | Admitting: *Deleted

## 2023-05-03 NOTE — Telephone Encounter (Signed)
 Called reading room to request stat ready for PET scan for patient's appt tomorrow with Dr. Gudena

## 2023-05-04 ENCOUNTER — Encounter: Payer: Self-pay | Admitting: *Deleted

## 2023-05-04 ENCOUNTER — Inpatient Hospital Stay: Admitting: Hematology and Oncology

## 2023-05-04 VITALS — BP 119/76 | HR 76 | Temp 97.8°F | Resp 18 | Ht 68.0 in | Wt 150.9 lb

## 2023-05-04 DIAGNOSIS — Z9221 Personal history of antineoplastic chemotherapy: Secondary | ICD-10-CM | POA: Diagnosis not present

## 2023-05-04 DIAGNOSIS — Z853 Personal history of malignant neoplasm of breast: Secondary | ICD-10-CM | POA: Diagnosis not present

## 2023-05-04 DIAGNOSIS — Z171 Estrogen receptor negative status [ER-]: Secondary | ICD-10-CM

## 2023-05-04 DIAGNOSIS — Z923 Personal history of irradiation: Secondary | ICD-10-CM | POA: Diagnosis not present

## 2023-05-04 DIAGNOSIS — C50311 Malignant neoplasm of lower-inner quadrant of right female breast: Secondary | ICD-10-CM | POA: Diagnosis not present

## 2023-05-04 DIAGNOSIS — Z9011 Acquired absence of right breast and nipple: Secondary | ICD-10-CM | POA: Diagnosis not present

## 2023-05-04 NOTE — Progress Notes (Signed)
 Patient Care Team: Creola Corn, MD as PCP - General (Internal Medicine) Ovidio Kin, MD as Consulting Physician (General Surgery) Serena Croissant, MD as Consulting Physician (Hematology and Oncology) Axel Filler, Larna Daughters, NP as Nurse Practitioner (Hematology and Oncology) Romualdo Bolk, MD as Consulting Physician (Obstetrics and Gynecology) Pershing Proud, RN as Oncology Nurse Navigator Donnelly Angelica, RN as Oncology Nurse Navigator  DIAGNOSIS:  Encounter Diagnosis  Name Primary?   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 Genes tested include:  ATM, BARD1, BRCA1, BRCA2, BRIP1, CDH1, CHEK2, EPCAM, FANCC, MLH1, MSH2, MSH6, NBN, PALB2, PMS2, PTEN, RAD51C, RAD51D, TP53, and XRCC2.   02/03/2015 Surgery   Right lumpectomy: Invasive ductal carcinoma grade 2, 2.1 cm, with associated DCIS, margins are negative, 0/1 lymph node, ER 0%, PR 0%, HER-2 positive ratio 2.31, T2 N0 stage II a   03/09/2015 - 06/22/2015 Chemotherapy   Adjuvant chemotherapy with TCH 6 followed by Herceptin maintenance for 1 year   08/11/2015 - 09/23/2015 Radiation Therapy   Adjuvant radiation therapy Basilio [facility]):  1) Right Breast / 50 Gy in 25 fractions ; 2) Right Breast Boost / 10 Gy in 5 fractions   01/30/2017 Relapse/Recurrence   Skin biopsy right breast: Metastatic breast cancer GCDFP strongly positive   02/15/2017 PET scan   Hypermetabolic focus of skin thickening  along the medial right breast/chest wall consistent with recurrent disease, no other hypermetabolic metastases identified.   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 The scan revealed a 1.3 cm nodule with high uptake, which was already known to be breast cancer. The patient reports no new symptoms or changes in her condition. The patient expresses some anxiety about the upcoming surgery and the potential for further cancer recurrence. 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     ALLERGIES:  has no known allergies.  MEDICATIONS:  Current Outpatient Medications  Medication Sig Dispense Refill   Calcium 500-2.5 MG-MCG CHEW      Cholecalciferol (VITAMIN D) 10 MCG/ML LIQD      CRESTOR 20 MG tablet Take 20 mg by mouth in the morning.  0   estradiol (ESTRACE) 0.1 MG/GM vaginal cream USE 1 GRAM VAGINALLY 2 TIMES WEEKLY AT BEDTIME 42.5 g 0   hydrochlorothiazide (HYDRODIURIL) 12.5 MG tablet Take  12.5 mg by mouth in the morning.     losartan (COZAAR) 100 MG tablet Take 100 mg by mouth in the morning.  0   Multiple Vitamin (MULTIVITAMIN) tablet Take 1 tablet by mouth daily.     Vibegron (GEMTESA) 75 MG TABS 1 tablet Orally Once a day     Polyethyl Glyc-Propyl Glyc PF (SYSTANE PRESERVATIVE FREE) 0.4-0.3 % SOLN Place 1-2  drops into both eyes 3 (three) times daily as needed (dry/irritated eyes). (Patient not taking: Reported on 04/24/2023)     No current facility-administered medications for this visit.    PHYSICAL EXAMINATION: ECOG PERFORMANCE STATUS: 1 - Symptomatic but completely ambulatory  Vitals:   05/04/23 1000  BP: 119/76  Pulse: 76  Resp: 18  Temp: 97.8 F (36.6 C)  SpO2: 99%   Filed Weights   05/04/23 1000  Weight: 150 lb 14.4 oz (68.4 kg)      LABORATORY DATA:  I have reviewed the data as listed    Latest Ref Rng & Units 05/05/2021    3:46 AM 04/22/2021   11:22 AM 05/14/2020    9:33 AM  CMP  Glucose 70 - 99 mg/dL 454  85  79   BUN 8 - 23 mg/dL 28  28  29    Creatinine 0.44 - 1.00 mg/dL 0.98  1.19  1.47   Sodium 135 - 145 mmol/L 135  138  139   Potassium 3.5 - 5.1 mmol/L 3.8  3.9  3.8   Chloride 98 - 111 mmol/L 106  106  104   CO2 22 - 32 mmol/L 22  25  25    Calcium 8.9 - 10.3 mg/dL 8.1  9.6  9.3   Total Protein 6.5 - 8.1 g/dL  6.8  6.5   Total Bilirubin 0.3 - 1.2 mg/dL  0.5  0.8   Alkaline Phos 38 - 126 U/L  78  83   AST 15 - 41 U/L  25  17   ALT 0 - 44 U/L  23  14     Lab Results  Component Value Date   WBC 8.2 05/05/2021   HGB 11.2 (L) 05/05/2021   HCT 32.8 (L) 05/05/2021   MCV 96.8 05/05/2021   PLT 165 05/05/2021   NEUTROABS 2.9 05/14/2020    ASSESSMENT & PLAN:  Breast cancer of lower-inner quadrant of right female breast (HCC) Right lumpectomy 02/03/2015: Invasive ductal carcinoma grade 2, 2.1 cm, with associated DCIS, margins are negative, 0/1 lymph node, ER 0%, PR 0%, HER-2 positive ratio 2.31, T2 N0 stage II a. (Right breast biopsy 3:30 position 12/09/2014: Invasive ductal carcinoma grade 3, ER 0%, PR 0%, HER-2 negative ratio 1.5, Ki-67 90%, 1 cm irregular mass T1C N0 stage IA clinical stage) Genetic counseling revealed NBN mutation   2. Adjuvant chemotherapy with TCH 6 cycles started 03/09/15 completed 06/22/15 took Herceptin maintenance for 1 year until  02/15/2016 3. Followed by radiation therapy 08/11/2015 to 09/22/2015 4. Cutaneous recurrence: 03/07/2017: Right mastectomy: IDC grade 3, 2 foci spanning 1.2 cm and 1.1 cm, lymphovascular invasion is present including dermal lymphatics, margins negative, 0/2 lymph nodes negative, ER 0%, PR 0%, HER-2 negative, Ki-67 not done,pT4b (skin involvement) N0 stage IIIc 5. Adjuvant radiation therapy 11/14/2017-12/29/2017 ----------------------------------------------------------------------------------------------- Patient decided not to receive any further systemic chemotherapy. There is no role of antiestrogen therapy because she is triple negative.   Second Chest wall Recurrence: 04/10/2023: [facility] dermatology Dr. Yetta Barre skin biopsy right chest wall: Poorly differentiated metastatic breast cancer ER 0%, PR  0%, Ki-67 70%, HER2 0 PET/CT 05/03/2023: Hypermetabolic nodule on the right chest wall.  No evidence of distant metastases  Treatment plan: Dr. Afton Horse was consulted for surgical excision Dr. Lurena Sally was consulted to discuss any role of radiation   We would like to do PD-L1 testing to determine if she would benefit from immunotherapy.  We can consider doing this in the final pathology or on the tissue from the dermatology biopsy.  Follow-up 1 month after surgery to discuss PD-L1 testing and determine adjuvant treatment plan.  No orders of the defined types were placed in this encounter.  The patient has a good understanding of the overall plan. she agrees with it. she will call with any problems that may develop before the next visit here. Total time spent: 30 mins including face to face time and time spent for planning, charting and co-ordination of care   Viinay K Tomasina Keasling, MD 05/04/23

## 2023-05-04 NOTE — Assessment & Plan Note (Signed)
 Right lumpectomy 02/03/2015: Invasive ductal carcinoma grade 2, 2.1 cm, with associated DCIS, margins are negative, 0/1 lymph node, ER 0%, PR 0%, HER-2 positive ratio 2.31, T2 N0 stage II a. (Right breast biopsy 3:30 position 12/09/2014: Invasive ductal carcinoma grade 3, ER 0%, PR 0%, HER-2 negative ratio 1.5, Ki-67 90%, 1 cm irregular mass T1C N0 stage IA clinical stage) Genetic counseling revealed NBN mutation   2. Adjuvant chemotherapy with TCH 6 cycles started 03/09/15 completed 06/22/15 took Herceptin maintenance for 1 year until 02/15/2016 3. Followed by radiation therapy 08/11/2015 to 09/22/2015 4. Cutaneous recurrence: 03/07/2017: Right mastectomy: IDC grade 3, 2 foci spanning 1.2 cm and 1.1 cm, lymphovascular invasion is present including dermal lymphatics, margins negative, 0/2 lymph nodes negative, ER 0%, PR 0%, HER-2 negative, Ki-67 not done,pT4b (skin involvement) N0 stage IIIc 5. Adjuvant radiation therapy 11/14/2017-12/29/2017 ----------------------------------------------------------------------------------------------- Patient decided not to receive any further systemic chemotherapy. There is no role of antiestrogen therapy because she is triple negative.   Second Chest wall Recurrence: 04/10/2023: [facility] dermatology Dr. Rochelle Chu skin biopsy right chest wall: Poorly differentiated metastatic breast cancer ER 0%, PR 0%, Ki-67 70%, HER2 0 PET/CT 05/03/2023: Hypermetabolic nodule on the right chest wall.  No evidence of distant metastases  Treatment plan: Dr. Afton Horse was consulted for surgical excision Dr. Lurena Sally was consulted to discuss any role of radiation

## 2023-05-04 NOTE — Progress Notes (Signed)
 Per MD request RN successfully faxed Caris report on recent skin biopsy from 04/10/23.  MD states if Caris can not be completed on this tissue we will have to wait for pt to have surgery in the next few weeks and test that tissue.

## 2023-05-08 ENCOUNTER — Ambulatory Visit: Payer: Self-pay | Admitting: Surgery

## 2023-05-08 DIAGNOSIS — C7989 Secondary malignant neoplasm of other specified sites: Secondary | ICD-10-CM | POA: Diagnosis not present

## 2023-05-08 DIAGNOSIS — C50911 Malignant neoplasm of unspecified site of right female breast: Secondary | ICD-10-CM | POA: Diagnosis not present

## 2023-05-12 ENCOUNTER — Encounter: Payer: Self-pay | Admitting: *Deleted

## 2023-05-12 DIAGNOSIS — C50311 Malignant neoplasm of lower-inner quadrant of right female breast: Secondary | ICD-10-CM

## 2023-05-15 NOTE — Progress Notes (Addendum)
 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   Histology per Pathology Report:    Receptor Status: ER(0%), PR (0%), Her2-neu (0), Ki-67(70%)  Did patient present with symptoms (if so, please note symptoms) or was this found on screening mammography?:  Patient found a little mole and it went to her dermatologist and biopsied the site and it came back positive for Breast cancer.  Past/Anticipated interventions by surgeon, if any:  05/23/2023 Cornett, MD Excision of Mass of Chest Wall  Past/Anticipated interventions by medical oncology, if any:  05/04/2023 Lee Public, MD  Lymphedema issues, if any:  Patient has lymphedema on right arm and patient wears a compression sleeve three times a week.   Pain issues, if any:  None  SAFETY ISSUES: Prior radiation? Yes Pacemaker/ICD? None Possible current pregnancy?None Is the patient on methotrexate? None  Current Complaints / other details:   None  Wt Readings from Last 3 Encounters:  05/19/23 147 lb 12.8 oz (67 kg)  05/04/23 150 lb 14.4 oz (68.4 kg)  04/24/23 151 lb 1.6 oz (68.5 kg)   BP (!) 147/82 (BP Location: Left Arm, Patient Position: Sitting, Cuff Size: Normal)   Pulse 68   Temp (!) 97 F (36.1 C)   Resp 20   Ht 5' 8.5" (1.74 m)   Wt 147 lb 12.8 oz (67 kg)   LMP 01/17/1982 (Approximate)   SpO2 100%   BMI 22.15 kg/m

## 2023-05-16 ENCOUNTER — Other Ambulatory Visit: Payer: Self-pay

## 2023-05-16 ENCOUNTER — Encounter (HOSPITAL_BASED_OUTPATIENT_CLINIC_OR_DEPARTMENT_OTHER): Payer: Self-pay | Admitting: Surgery

## 2023-05-16 ENCOUNTER — Telehealth: Payer: Self-pay | Admitting: Hematology and Oncology

## 2023-05-16 NOTE — Progress Notes (Signed)
   05/16/23 1332  Pre-op Phone Call  Surgery Date Verified 05/23/23  Arrival Time Verified 1115  Surgery Location Verified [facility]   Medical History Reviewed Yes  Is the patient taking a GLP-1 receptor agonist? No  Does the patient have diabetes? No diagnosis of diabetes  Do you have a history of heart problems? No  Does patient have other implanted devices? No  Patient Teaching Enhanced Recovery;Pre / Post Procedure  Patient educated about smoking cessation 24 hours prior to surgery. N/A Non-Smoker  Patient verbalizes understanding of bowel prep? N/A  THA/TKA patients only:  By your surgery date, will you have been taking narcotics for 90 days or greater? No  Med Rec Completed Yes  Recent  Lab Work, EKG, CXR? No  NPO (Including gum & candy) After midnight  Allowed clear liquids Water;Gatorade  (diabetics please choose diet or no sugar options)  Patient instructed to stop clear liquids including Carb loading drink at: 0900  Did patient view EMMI videos? No  Responsible adult to drive and be with you for 24 hours? Yes  Name & Phone Number for Ride/Caregiver sister and son  No Jewelry, money, nail polish or make-up.  No lotions, powders, perfumes. No shaving  48 hrs. prior to surgery. Yes  Contacts, Dentures & Glasses Will Have to be Removed Before OR. Yes  Please bring your ID and Insurance Card the morning of your surgery. (Surgery Centers Only) Yes  Bring any papers or x-rays with you that your surgeon gave you. Yes  Instructed to contact the location of procedure/ provider if they or anyone in their household develops symptoms or tests positive for COVID-19, has close contact with someone who tests positive for COVID, or has known exposure to any contagious illness. Yes  Call this number the morning of surgery  with any problems that may cancel your surgery. [phone]  Covid-19 Assessment  Have you had a positive COVID-19 test within the previous 90 days? No  COVID Testing Guidance  Proceed with the additional questions.  Patient's surgery required a COVID-19 test (cardiothoracic, complex ENT, and bronchoscopies/ EBUS) No  Have you been unmasked and in close contact with anyone with COVID-19 or COVID-19 symptoms within the past 10 days? No  Do you or anyone in your household currently have any COVID-19 symptoms? No

## 2023-05-16 NOTE — Telephone Encounter (Signed)
 Confirmed with pt scheduled appt date and time

## 2023-05-17 ENCOUNTER — Encounter (HOSPITAL_BASED_OUTPATIENT_CLINIC_OR_DEPARTMENT_OTHER)
Admission: RE | Admit: 2023-05-17 | Discharge: 2023-05-17 | Disposition: A | Discharge status: Home / Self Care | Source: Ambulatory Visit | Attending: Surgery | Admitting: Surgery

## 2023-05-17 DIAGNOSIS — Z01818 Encounter for other preprocedural examination: Secondary | ICD-10-CM | POA: Insufficient documentation

## 2023-05-17 DIAGNOSIS — Z01810 Encounter for preprocedural cardiovascular examination: Secondary | ICD-10-CM | POA: Diagnosis not present

## 2023-05-17 DIAGNOSIS — Z01812 Encounter for preprocedural laboratory examination: Secondary | ICD-10-CM | POA: Diagnosis not present

## 2023-05-17 LAB — BASIC METABOLIC PANEL WITH GFR
Anion gap: 7 (ref 5–15)
BUN: 26 mg/dL — ABNORMAL HIGH (ref 8–23)
CO2: 25 mmol/L (ref 22–32)
Calcium: 9.1 mg/dL (ref 8.9–10.3)
Chloride: 107 mmol/L (ref 98–111)
Creatinine, Ser: 1.25 mg/dL — ABNORMAL HIGH (ref 0.44–1.00)
GFR, Estimated: 43 mL/min — ABNORMAL LOW (ref >60)
Glucose, Bld: 83 mg/dL (ref 70–99)
Potassium: 4 mmol/L (ref 3.5–5.1)
Sodium: 139 mmol/L (ref 135–145)

## 2023-05-17 MED ORDER — CHLORHEXIDINE GLUCONATE CLOTH 2 % EX PADS: 6.0000 | MEDICATED_PAD | Freq: Once | CUTANEOUS | Status: DC

## 2023-05-17 NOTE — Progress Notes (Signed)

## 2023-05-18 ENCOUNTER — Inpatient Hospital Stay
Admission: RE | Admit: 2023-05-18 | Discharge: 2023-05-18 | Disposition: A | Discharge status: Home / Self Care | Payer: Self-pay | Source: Ambulatory Visit | Attending: Radiation Oncology | Admitting: Radiation Oncology

## 2023-05-18 ENCOUNTER — Other Ambulatory Visit: Payer: Self-pay | Admitting: Radiation Oncology

## 2023-05-18 DIAGNOSIS — C50311 Malignant neoplasm of lower-inner quadrant of right female breast: Secondary | ICD-10-CM

## 2023-05-18 NOTE — Progress Notes (Signed)
 Radiation Oncology         [phone] ________________________________  Outpatient Re-Consultation   Name: Shawna [name] MRN: [id number]  Date: 05/19/2023  DOB: 22-Nov-1940  WU:[id number], Autry Legions, MD  Cameron Cea, MD   REFERRING PHYSICIAN: Cameron Cea, MD  DIAGNOSIS:    ICD-10-CM   1. 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, right [facility]) Staging form: Breast, AJCC 8th Edition - Clinical: Stage IIIC (cT4b, cN0, cM0, G3, ER-, PR-, HER2-) - Unsigned Histologic grading system: 3 grade system  Breast cancer of lower-inner quadrant of right female breast [facility]) Staging form: Breast, AJCC 7th Edition - Pathologic: Stage IIA (T2, N0, cM0) - Unsigned   Recent recurrence of triple negative breast cancer to the right chest wall - March 2025  Right breast cancer recurrence in 2019 : Stage IIIc, pT4b (due to gross involvement of skin), clinically recurrent, pNXpMX, Right Breast Invasive Ductal Carcinoma, ER(-) / PR(-) / Her2(-), Grade 3 -- s/p right breast mastectomy followed by adjuvant radiation therapy   Initial Diagnosis is 2017 : pT2N0 cM0 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 female who is known to me for her history of recurrent right breast cancer s/p radiation therapy in 2017 and 2019. As detailed below, she initially underwent a right breast lumpectomy followed by adjiuvant chemotherapy and radiation therapy in 2017. Her recurrent disease to the chest wall in 2019 was managed with a right breast lumpectomy and additional adjuvant radiation therapy. She was last seen by  myself in January of 2020 and has continued to follow with Dr. Alix Aquas office since that time. Her history pertaining to her recent recurrence of breast cancer is detailed as follows.   The patient presented to her gynecologist on 04/03/23 for a routine visit. At that time, she reported a new skin lesion on her right chest which has been present for 3 weeks.   She was accordingly referred to Dr. Rochelle Chu at [facility] and underwent a biopsy of the right-mid chest lesion on 04/10/23. Pathology unfortunately showed findings consistent with poorly differentiated carcinoma, favoring a breast carcinoma primary. ER status: 0% negative; PR status 0% negative; Proliferation marker Ki67 at 70%; Her2 status negative.  Based on these findings, she presented to Dr. Lee Public on 04/23/33 to discuss management options. Options discussed at that time included a referral to Dr. Afton Horse for consideration of surgical resection, as well as a referral for radiation therapy.  In addition to the new lesion, the patient reported having a new onset of hallucinations with abnormal sweet smells at that time. She also reported that she started hearing droplets of water on a constant basis.   Given her reported symptoms, Dr. Lee Public ordered imaging of the brain to rule out metastatic disease, as well as a PET scan to complete her staging work-up (dates and results detailed as follows).  -- An MRI of the brain with and without contrast on 04/24/23 which demonstrated no evidence of intracranial metastatic disease.   -- PET scan on 05/01/23 demonstrated a hypermetabolic nodule in the medial right chest wall adjacent to the sternum concerning  for local breast carcinoma recurrence - correlating with her biopsy prove site of recurrence in the medial right chest. Other findings of potential clinical significance included metabolic activity along thoracic spine favored to represent benign paraspinal brown fat, and post radiation  changes in the right upper lobe with associated rib fractures. PET otherwise showed no evidence of metastatic disease elsewhere in the chest, abdomen, or pelvis.   She was then promptly seen by Dr. Afton Horse on 05/08/23 who has recommended wide excision of right chest wall recurrence. Her procedure is currently scheduled for 05/23/23.  Dr. Gudena would also like to obtain PD-L1 testing on her surgical sample or derm-biopsy sample to determine if she would benefit from immunotherapy.   Photo taken by Dr. Lee Public on 04/24/23 - looks similar today:    More details from visit today:  Shawna [name] is an 83 year old female with recurrent breast cancer who presents for evaluation of a new chest nodule.  She has a history of breast cancer treated with radiation in 2017 and a mastectomy with additional radiation in 2019 due to recurrence. The current recurrence presents as a 1.5 cm oozing nodular mass to the right of the sternum, superior to the medial aspect of her mastectomy scar. A biopsy was performed on March 24, and the site continues to leak. A recent PET scan shows no other disease sites.  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 She uses a compression sleeve three times a week, which provides relief. The lymphedema does not involve her wrist.  PREVIOUS RADIATION THERAPY: Yes   2) Diagnosis:   Stage IIIc, pT4b (due to gross involvement of skin), clinically recurrent, pNXpMX, Right Breast Invasive Ductal Carcinoma, ER(-) / PR(-) / Her2(-), Grade 3 -- s/p right breast mastectomy followed by adjuvant radiation therapy  Indication for treatment:  Curative      Radiation treatment dates:   11/13/17 - 12/29/17 Site/dose:   1. Right Chest Wall and IM nodes / 50.4 Gy in 28 fractions 2. Right Supraclavicular and PAB nodes / 50.4 Gy in 28 fractions 3. Right Chest Wall Boost / 10 Gy in 5 fractions  Beams/energy:    1. 3D, photons / 10X//6X 2. 3D, photons /  10X//6X 3. 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 -- s/p right breast lumpectomy, adjuvant chemotherapy adjuvant radiation therapy Indication for treatment:  curative     Radiation treatment dates:   08/11/2015-09/23/2015 Site/dose:   1) Right Breast / 50 Gy in 25 fractions ; 2) Right Breast Boost / 10 Gy in 5 fractions Beams/energy:   1) 3D / 10 and 6 X  ; 2) Electrons / 9 MeV  PAST MEDICAL HISTORY:  has a past medical history of Arthritis, Breast cancer (HCC) (11/2014), Family history of breast cancer, Family history of colon cancer, History of kidney stones, History of radiation therapy (08/11/15- 09/23/2015), History of radiation therapy (11/13/17- 12/29/17), Hyperlipidemia, Hypertension, Monoallelic mutation of NBN gene, Osteoporosis, Pessary maintenance, Pneumonia, and Skin cancer of nose.    PAST SURGICAL HISTORY: Past Surgical History:  Procedure Laterality Date   APPENDECTOMY     bilateral cataract surgery      BREAST LUMPECTOMY WITH RADIOACTIVE SEED AND SENTINEL LYMPH NODE BIOPSY Right 02/03/2015   Procedure: RIGHT BREAST LUMPECTOMY WITH RADIOACTIVE SEED AND RIGHT SENTINEL LYMPH NODE BIOPSY;  Surgeon: Juanita Norlander, MD;  Location: [facility];  Service: General;  Laterality: Right;   BREAST SURGERY  11/2012   biopsy, benign breast tissue   COLONOSCOPY  06/2010   rec   LITHOTRIPSY     kidney stone on left side   MASTECTOMY W/ SENTINEL NODE BIOPSY Right 03/07/2017   Procedure: RIGHT TOTAL MASTECTOMY WITH RIGHT AXILLARY SENTINEL LYMPH NODE BIOPSY;  Surgeon: Juanita Norlander, MD;  Location: [facility];  Service: General;  Laterality: Right;   OVARIAN CYST REMOVAL  age 77   POLYPECTOMY  04/2005   colon   PORTACATH PLACEMENT Left 02/03/2015   Procedure: INSERTION PORT-A-CATH WITH ULTRA SOUND GUIDANCE;  Surgeon: Juanita Norlander, MD;  Location: [facility];  Service: General;  Laterality: Left;    portacath removal      reexcision  10/09/2017   Dr. Odean Bend, chest wall excision for recurrent breast cancer.    TOTAL KNEE ARTHROPLASTY Right 05/04/2021   Procedure: TOTAL KNEE ARTHROPLASTY;  Surgeon: Claiborne Crew, MD;  Location: [facility];  Service: Orthopedics;  Laterality: Right;    FAMILY HISTORY: family history includes Breast cancer (age of onset: 84) in her sister; Breast cancer (age of onset: 46) in her mother; Colon cancer (age of onset: 15) in her sister; Dementia in her father; Heart disease in her father; Hypertension in her mother; Lymphoma (age of onset: 27) in her sister; Melanoma in her sister; Osteoporosis in her mother; Skin cancer in her brother and daughter; Throat cancer (age of onset: 43) in her brother.  SOCIAL HISTORY:  reports that she has never smoked. She has never used smokeless tobacco. She reports that she does not drink alcohol and does not use drugs.  ALLERGIES: Patient has no known allergies.  MEDICATIONS:  Current Outpatient Medications  Medication Sig Dispense Refill   Calcium  500-2.5 MG-MCG CHEW      Cholecalciferol (VITAMIN D) 10 MCG/ML LIQD      CRESTOR  20 MG tablet Take 20 mg by mouth in the morning.  0   estradiol  (ESTRACE ) 0.1 MG/GM vaginal cream USE 1 GRAM VAGINALLY 2 TIMES WEEKLY AT BEDTIME 42.5 g 0   hydrochlorothiazide  (HYDRODIURIL ) 12.5 MG tablet Take 12.5 mg by mouth in the morning.     losartan  (COZAAR ) 100 MG tablet Take 100 mg by mouth in the morning.  0   Polyethyl Glyc-Propyl Glyc PF (SYSTANE PRESERVATIVE FREE) 0.4-0.3 % SOLN Place 1-2 drops into both eyes 3 (three) times daily as needed (dry/irritated eyes).     Vibegron (GEMTESA) 75 MG TABS 1 tablet Orally Once a day     Multiple Vitamin (MULTIVITAMIN) tablet Take 1 tablet by mouth daily. (Patient not taking: Reported on 05/19/2023)     No current facility-administered medications for this encounter.   Facility-Administered Medications Ordered in Other Encounters  Medication Dose Route  Frequency Provider Last Rate Last Admin   Chlorhexidine  Gluconate Cloth 2 % PADS 6 each  6 each Topical Once Cornett, Thomas, MD       And   Chlorhexidine  Gluconate Cloth 2 % PADS 6 each  6 each Topical Once Cornett, Thomas, MD        REVIEW OF SYSTEMS: As above in HPI.   PHYSICAL EXAM:  height is 5' 8.5" (1.74 m) and weight is 147 lb 12.8 oz (67 kg). Her temperature is 97 F (36.1 C) (abnormal). Her blood pressure is 147/82 (abnormal) and her pulse is 68. Her respiration is 20 and oxygen saturation is 100%.   General: Alert and oriented, in no acute distress HEENT: Head  is normocephalic. Extraocular movements are intact.  NECK: No masses in cervical and supraclavicular neck. CHEST: Lungs clear to auscultation bilaterally. CARDIOVASCULAR: Heart regular rate, occasional irregular rhythm. BREAST: Oozing nodular mass right of sternum, superior to medial mastectomy scar, ~1.5 cm. No other nodules or masses in right chest wall. ABDOMEN: Abdomen soft and non-tender. EXTREMITIES: Swelling in right forearm and tricep area. No edema in ankles. MUSCULOSKELETAL: Normal range of motion in shoulders. Slight limp when walking   ECOG = 1  0 - Asymptomatic (Fully active, able to carry on all predisease activities without restriction)  1 - Symptomatic but completely ambulatory (Restricted in physically strenuous activity but ambulatory and able to carry out work of a light or sedentary nature. For example, light housework, office work)  2 - Symptomatic, <50% in bed during the day (Ambulatory and capable of all self care but unable to carry out any work activities. Up and about more than 50% of waking hours)  3 - Symptomatic, >50% in bed, but not bedbound (Capable of only limited self-care, confined to bed or chair 50% or more of waking hours)  4 - Bedbound (Completely disabled. Cannot carry on any self-care. Totally confined to bed or chair)  5 - Death   Aurea Blossom MM, Creech RH, Tormey DC, et al.  [phone]). "Toxicity and response criteria of the [facility]". Am. Hillard Lowes. Oncol. 5 (6): 649-55   LABORATORY DATA:   CBC    Component Value Date/Time   WBC 8.2 05/05/2021 0346   RBC 3.39 (L) 05/05/2021 0346   HGB 11.2 (L) 05/05/2021 0346   HGB 13.7 05/14/2020 0933   HGB 12.6 01/25/2016 0916   HCT 32.8 (L) 05/05/2021 0346   HCT 37.3 01/25/2016 0916   PLT 165 05/05/2021 0346   PLT 192 05/14/2020 0933   PLT 155 01/25/2016 0916   MCV 96.8 05/05/2021 0346   MCV 98.7 01/25/2016 0916   MCH 33.0 05/05/2021 0346   MCHC 34.1 05/05/2021 0346   RDW 13.2 05/05/2021 0346   RDW 13.4 01/25/2016 0916   LYMPHSABS 0.5 (L) 05/14/2020 0933   LYMPHSABS 0.6 (L) 01/25/2016 0916   MONOABS 0.2 05/14/2020 0933   MONOABS 0.2 01/25/2016 0916   EOSABS 0.1 05/14/2020 0933   EOSABS 0.1 01/25/2016 0916   BASOSABS 0.0 05/14/2020 0933   BASOSABS 0.0 01/25/2016 0916    CMP     Component Value Date/Time   NA 139 05/17/2023 1130   NA 140 01/25/2016 0916   K 4.0 05/17/2023 1130   K 3.8 01/25/2016 0916   CL 107 05/17/2023 1130   CO2 25 05/17/2023 1130   CO2 24 01/25/2016 0916   GLUCOSE 83 05/17/2023 1130   GLUCOSE 79 01/25/2016 0916   BUN 26 (H) 05/17/2023 1130   BUN 30.7 (H) 01/25/2016 0916   CREATININE 1.25 (H) 05/17/2023 1130   CREATININE 1.10 (H) 05/14/2020 0933   CREATININE 1.0 01/25/2016 0916   CALCIUM  9.1 05/17/2023 1130   CALCIUM  9.8 01/25/2016 0916   PROT 6.8 04/22/2021 1122   PROT 6.5 01/25/2016 0916   ALBUMIN 4.0 04/22/2021 1122   ALBUMIN 3.9 01/25/2016 0916   AST 25 04/22/2021 1122   AST 17 05/14/2020 0933   AST 33 01/25/2016 0916   ALT 23 04/22/2021 1122   ALT 14 05/14/2020 0933   ALT 33 01/25/2016 0916   ALKPHOS 78 04/22/2021 1122   ALKPHOS 113 01/25/2016 0916   BILITOT 0.5 04/22/2021 1122   BILITOT 0.8 05/14/2020 0933  BILITOT 0.69 01/25/2016 0916   EGFR 53 (L) 01/25/2016 0916   GFRNONAA 43 (L) 05/17/2023 1130   GFRNONAA 51 (L) 05/14/2020 0933       RADIOGRAPHY: NM PET Image Restag (PS) Skull Base To Thigh Result Date: 05/03/2023 CLINICAL DATA:  Subsequent treatment strategy for breast carcinoma. Stage IV breast carcinoma. EXAM: NUCLEAR MEDICINE PET SKULL BASE TO THIGH TECHNIQUE: 7.5 mCi F-18 FDG was injected intravenously. Full-ring PET imaging was performed from the skull base to thigh after the radiotracer. CT data was obtained and used for attenuation correction and anatomic localization. Fasting blood glucose: 93 mg/dl COMPARISON:  Chest CT 47/82/9562 FINDINGS: NECK: No hypermetabolic lymph nodes in the neck. Incidental CT findings: None. CHEST: Post RIGHT mastectomy anatomy. In the medial RIGHT chest wall adjacent to the sternum, intensely hypermetabolic nodule measuring 13 mm on image 88 with SUV max equal 10.7. There is consolidation with air bronchograms in the RIGHT lung apex consistent with post radiation change with no significant metabolic activity (image 49/4 There is subpleural thickening in the RIGHT upper lobe related to prior radiation therapy. There several anterior RIGHT chest wall rib fractures prior related to surgery and radiation in the anterior RIGHT upper lobe. No suspicious pulmonary nodules. No clear hypermetabolic mediastinal lymph nodes. No pericardial fluid. There is intense paraspinal uptake which is symmetric along the thoracic spine. This is favored benign hypermetabolic brown fat. More focal uptake in the posterior LEFT chest wall on image 43 is favored related to brown fat also. Incidental CT findings: None. ABDOMEN/PELVIS: No abnormal hypermetabolic activity within the liver, pancreas, adrenal glands, or spleen. No hypermetabolic lymph nodes in the abdomen or pelvis. Incidental CT findings: Pessary device noted. Benign renal cysts. Uterus and adnexa unremarkable. SKELETON: No focal hypermetabolic activity to suggest skeletal metastasis. Incidental CT findings: None. IMPRESSION: 1. Hypermetabolic nodule in the  medial RIGHT chest wall adjacent to the sternum is concerning for local breast carcinoma recurrence. Recommend ultrasound-guided percutaneous sampling. 2. Metabolic activity along thoracic spine is favored benign paraspinal brown fat. 3. Post radiation changes in the RIGHT upper lobe with associated rib fractures. No evidence of local recurrence in lung. 4. No evidence of metastatic disease in the abdomen pelvis. Electronically Signed   By: Deboraha Fallow M.D.   On: 05/03/2023 17:03   MR Brain W Wo Contrast Result Date: 04/24/2023 CLINICAL DATA:  Breast cancer, invasive, stage I/II/III, initial workup. Psychosis. Hearing noises and sweet smells. EXAM: MRI HEAD WITHOUT AND WITH CONTRAST TECHNIQUE: Multiplanar, multiecho pulse sequences of the brain and surrounding structures were obtained without and with intravenous contrast. CONTRAST:  6mL GADAVIST  GADOBUTROL  1 MMOL/ML IV SOLN COMPARISON:  None Available. FINDINGS: Brain: There is no evidence of an acute infarct, intracranial hemorrhage, mass, midline shift, or extra-axial fluid collection. Patchy T2 hyperintensities in the cerebral white matter bilaterally are nonspecific but compatible with mild-to-moderate chronic small vessel ischemic disease. There are chronic lacunar infarcts in the basal ganglia bilaterally. There is moderate cerebral atrophy. No abnormal enhancement is identified. Vascular: Major intracranial vascular flow voids are preserved. Skull and upper cervical spine: Unremarkable bone marrow signal. Sinuses/Orbits: Bilateral cataract extraction. Paranasal sinuses and mastoid air cells are clear. Other: None. IMPRESSION: 1. No evidence of intracranial metastases or acute abnormality. 2. Mild-to-moderate chronic small vessel ischemic disease and moderate cerebral atrophy. Electronically Signed   By: Aundra Lee M.D.   On: 04/24/2023 16:56      IMPRESSION/PLAN:   Recurrent breast cancer with chest wall involvement Recurrent breast cancer  with  a 1.5 cm nodular mass in the midline of the chest, superior to the medial mastectomy scar, oozing. PET scan shows no other disease sites. Surgical excision planned with post-excision assessment of tissue sensitivity to immunotherapy. Potential use of immunotherapy pending PDL1 score. Radiation therapy considered but poses risks due to previous high-dose radiation twice before, including risk of nonhealing wounds, bone exposure, and potential rib fractures. Decision on radiation depends on surgical margins and PDL1 score and discussion with team postoperatively. Radiation would be superficial to minimize heart risk, with risks including skin peeling, nonhealing wounds, and bone exposure. - Assess tissue sensitivity to immunotherapy post-excision. - Discuss potential use of immunotherapy w/ team based on PDL1 score. - Evaluate surgical margins post-excision. - May consider radiation therapy if surgical margins are not clear and no other good options are available.  This will certainly require a multidisciplinary discussion given the complexity of her case and the associated risks - Discuss radiation therapy risks, including nonhealing wounds, bone exposure, and potential rib fractures.  Consent form signed today include case she proceeds with treatment in the future - Coordinate with the oncology team to determine the best treatment approach post-surgery.  Rib fracture possibly related to prior radiation Rib fracture on PET scan, possibly related to prior radiation exposure, contributing to bone brittleness. She is asymptomatic  - Reviewed PET scan today with the patient to confirm the location of the rib fracture.  This is not close to her current site of recurrence.  Lymphedema of right arm Lymphedema in the right arm, affecting forearm and tricep area. She uses a compression sleeve three times a week, providing relief. The wrist is unaffected. - Continue use of compression sleeve as needed for  lymphedema management.     On date of service, in total, I spent 60 minutes on this encounter. Patient was seen in person.   __________________________________________   Colie Dawes, MD  This document serves as a record of services personally performed by Colie Dawes, MD. It was created on her behalf by Aleta Anda, a trained medical scribe. The creation of this record is based on the scribe's personal observations and the provider's statements to them. This document has been checked and approved by the attending provider.

## 2023-05-19 ENCOUNTER — Encounter: Payer: Self-pay | Admitting: Radiation Oncology

## 2023-05-19 ENCOUNTER — Ambulatory Visit
Admission: RE | Admit: 2023-05-19 | Discharge: 2023-05-19 | Disposition: A | Discharge status: Home / Self Care | Source: Ambulatory Visit | Attending: Radiation Oncology | Admitting: Radiation Oncology

## 2023-05-19 VITALS — BP 147/82 | HR 68 | Temp 97.0°F | Resp 20 | Ht 68.5 in | Wt 147.8 lb

## 2023-05-19 DIAGNOSIS — Z800 Family history of malignant neoplasm of digestive organs: Secondary | ICD-10-CM | POA: Insufficient documentation

## 2023-05-19 DIAGNOSIS — Z9011 Acquired absence of right breast and nipple: Secondary | ICD-10-CM | POA: Diagnosis not present

## 2023-05-19 DIAGNOSIS — C50311 Malignant neoplasm of lower-inner quadrant of right female breast: Secondary | ICD-10-CM | POA: Insufficient documentation

## 2023-05-19 DIAGNOSIS — I890 Lymphedema, not elsewhere classified: Secondary | ICD-10-CM | POA: Insufficient documentation

## 2023-05-19 DIAGNOSIS — I6782 Cerebral ischemia: Secondary | ICD-10-CM | POA: Diagnosis not present

## 2023-05-19 DIAGNOSIS — Z923 Personal history of irradiation: Secondary | ICD-10-CM | POA: Diagnosis not present

## 2023-05-19 DIAGNOSIS — C7989 Secondary malignant neoplasm of other specified sites: Secondary | ICD-10-CM | POA: Diagnosis not present

## 2023-05-19 DIAGNOSIS — Z9221 Personal history of antineoplastic chemotherapy: Secondary | ICD-10-CM | POA: Diagnosis not present

## 2023-05-19 DIAGNOSIS — Z171 Estrogen receptor negative status [ER-]: Secondary | ICD-10-CM

## 2023-05-19 DIAGNOSIS — Z1732 Human epidermal growth factor receptor 2 negative status: Secondary | ICD-10-CM | POA: Insufficient documentation

## 2023-05-19 DIAGNOSIS — M810 Age-related osteoporosis without current pathological fracture: Secondary | ICD-10-CM | POA: Insufficient documentation

## 2023-05-19 DIAGNOSIS — N281 Cyst of kidney, acquired: Secondary | ICD-10-CM | POA: Diagnosis not present

## 2023-05-23 ENCOUNTER — Ambulatory Visit (HOSPITAL_BASED_OUTPATIENT_CLINIC_OR_DEPARTMENT_OTHER): Payer: Self-pay | Admitting: Certified Registered"

## 2023-05-23 ENCOUNTER — Encounter (HOSPITAL_BASED_OUTPATIENT_CLINIC_OR_DEPARTMENT_OTHER): Payer: Self-pay | Admitting: Surgery

## 2023-05-23 ENCOUNTER — Encounter (HOSPITAL_BASED_OUTPATIENT_CLINIC_OR_DEPARTMENT_OTHER)
Admission: RE | Disposition: A | Discharge status: Home / Self Care | Payer: Self-pay | Source: Home / Self Care | Attending: Surgery

## 2023-05-23 ENCOUNTER — Ambulatory Visit (HOSPITAL_BASED_OUTPATIENT_CLINIC_OR_DEPARTMENT_OTHER)
Admission: RE | Admit: 2023-05-23 | Discharge: 2023-05-23 | Disposition: A | Discharge status: Home / Self Care | Attending: Surgery | Admitting: Surgery

## 2023-05-23 ENCOUNTER — Other Ambulatory Visit: Payer: Self-pay

## 2023-05-23 DIAGNOSIS — C7989 Secondary malignant neoplasm of other specified sites: Secondary | ICD-10-CM | POA: Insufficient documentation

## 2023-05-23 DIAGNOSIS — Z9011 Acquired absence of right breast and nipple: Secondary | ICD-10-CM | POA: Insufficient documentation

## 2023-05-23 DIAGNOSIS — Z923 Personal history of irradiation: Secondary | ICD-10-CM | POA: Insufficient documentation

## 2023-05-23 DIAGNOSIS — C7889 Secondary malignant neoplasm of other digestive organs: Secondary | ICD-10-CM | POA: Diagnosis not present

## 2023-05-23 DIAGNOSIS — C50311 Malignant neoplasm of lower-inner quadrant of right female breast: Secondary | ICD-10-CM | POA: Diagnosis not present

## 2023-05-23 DIAGNOSIS — C44509 Unspecified malignant neoplasm of skin of other part of trunk: Secondary | ICD-10-CM | POA: Diagnosis not present

## 2023-05-23 DIAGNOSIS — C50911 Malignant neoplasm of unspecified site of right female breast: Secondary | ICD-10-CM

## 2023-05-23 DIAGNOSIS — Z01818 Encounter for other preprocedural examination: Secondary | ICD-10-CM

## 2023-05-23 DIAGNOSIS — C50811 Malignant neoplasm of overlapping sites of right female breast: Secondary | ICD-10-CM | POA: Diagnosis not present

## 2023-05-23 HISTORY — PX: MASS EXCISION: SHX2000

## 2023-05-23 SURGERY — EXCISION, MASS, CHEST WALL
Anesthesia: General | Site: Chest | Laterality: Right

## 2023-05-23 MED ORDER — ACETAMINOPHEN 500 MG PO TABS
ORAL_TABLET | ORAL | Status: AC
  Filled 2023-05-23: qty 2

## 2023-05-23 MED ORDER — OXYCODONE HCL 5 MG PO TABS
5.0000 mg | ORAL_TABLET | Freq: Once | ORAL | Status: AC | PRN
  Administered 2023-05-23: 5 mg via ORAL

## 2023-05-23 MED ORDER — BUPIVACAINE-EPINEPHRINE 0.25% -1:200000 IJ SOLN
INTRAMUSCULAR | Status: DC | PRN
  Administered 2023-05-23: 20 mL

## 2023-05-23 MED ORDER — LIDOCAINE HCL (CARDIAC) PF 100 MG/5ML IV SOSY
PREFILLED_SYRINGE | INTRAVENOUS | Status: DC | PRN
  Administered 2023-05-23: 60 mg via INTRAVENOUS

## 2023-05-23 MED ORDER — LIDOCAINE 2% (20 MG/ML) 5 ML SYRINGE
INTRAMUSCULAR | Status: AC
  Filled 2023-05-23: qty 5

## 2023-05-23 MED ORDER — FENTANYL CITRATE (PF) 100 MCG/2ML IJ SOLN
INTRAMUSCULAR | Status: DC | PRN
  Administered 2023-05-23 (×3): 25 ug via INTRAVENOUS

## 2023-05-23 MED ORDER — ACETAMINOPHEN 500 MG PO TABS
1000.0000 mg | ORAL_TABLET | ORAL | Status: AC
  Administered 2023-05-23: 1000 mg via ORAL

## 2023-05-23 MED ORDER — ACETAMINOPHEN 500 MG PO TABS: 1000.0000 mg | ORAL_TABLET | Freq: Once | ORAL | Status: AC

## 2023-05-23 MED ORDER — ONDANSETRON HCL 4 MG/2ML IJ SOLN
INTRAMUSCULAR | Status: DC | PRN
  Administered 2023-05-23: 4 mg via INTRAVENOUS

## 2023-05-23 MED ORDER — FENTANYL CITRATE (PF) 100 MCG/2ML IJ SOLN: 25.0000 ug | INTRAMUSCULAR | Status: DC | PRN

## 2023-05-23 MED ORDER — OXYCODONE HCL 5 MG PO TABS
ORAL_TABLET | ORAL | Status: AC
  Filled 2023-05-23: qty 1

## 2023-05-23 MED ORDER — CELECOXIB 200 MG PO CAPS
ORAL_CAPSULE | ORAL | Status: AC
  Filled 2023-05-23: qty 1

## 2023-05-23 MED ORDER — EPHEDRINE SULFATE (PRESSORS) 50 MG/ML IJ SOLN
INTRAMUSCULAR | Status: DC | PRN
  Administered 2023-05-23: 5 mg via INTRAVENOUS

## 2023-05-23 MED ORDER — ONDANSETRON HCL 4 MG/2ML IJ SOLN
INTRAMUSCULAR | Status: AC
  Filled 2023-05-23: qty 2

## 2023-05-23 MED ORDER — GABAPENTIN 300 MG PO CAPS
ORAL_CAPSULE | ORAL | Status: AC
  Filled 2023-05-23: qty 1

## 2023-05-23 MED ORDER — CEFAZOLIN SODIUM-DEXTROSE 2-4 GM/100ML-% IV SOLN
2.0000 g | INTRAVENOUS | Status: AC
Start: 2023-05-23 — End: 2023-05-23
  Administered 2023-05-23: 2 g via INTRAVENOUS

## 2023-05-23 MED ORDER — PROPOFOL 10 MG/ML IV BOLUS
INTRAVENOUS | Status: AC
  Filled 2023-05-23: qty 20

## 2023-05-23 MED ORDER — FENTANYL CITRATE (PF) 100 MCG/2ML IJ SOLN
INTRAMUSCULAR | Status: AC
  Filled 2023-05-23: qty 2

## 2023-05-23 MED ORDER — DEXAMETHASONE SODIUM PHOSPHATE 10 MG/ML IJ SOLN
INTRAMUSCULAR | Status: DC | PRN
  Administered 2023-05-23: 10 mg via INTRAVENOUS

## 2023-05-23 MED ORDER — OXYCODONE HCL 5 MG/5ML PO SOLN: 5.0000 mg | Freq: Once | ORAL | Status: AC | PRN

## 2023-05-23 MED ORDER — LACTATED RINGERS IV SOLN: INTRAVENOUS | Status: DC

## 2023-05-23 MED ORDER — DEXAMETHASONE SODIUM PHOSPHATE 10 MG/ML IJ SOLN
INTRAMUSCULAR | Status: AC
  Filled 2023-05-23: qty 1

## 2023-05-23 MED ORDER — CEFAZOLIN SODIUM-DEXTROSE 2-4 GM/100ML-% IV SOLN
INTRAVENOUS | Status: AC
  Filled 2023-05-23: qty 100

## 2023-05-23 MED ORDER — AMISULPRIDE (ANTIEMETIC) 5 MG/2ML IV SOLN: 10.0000 mg | Freq: Once | INTRAVENOUS | Status: DC | PRN

## 2023-05-23 MED ORDER — 0.9 % SODIUM CHLORIDE (POUR BTL) OPTIME
TOPICAL | Status: DC | PRN
  Administered 2023-05-23: 1000 mL

## 2023-05-23 MED ORDER — OXYCODONE HCL 5 MG PO TABS: 5.0000 mg | ORAL_TABLET | Freq: Four times a day (QID) | ORAL | 0 refills | Status: DC | PRN

## 2023-05-23 MED ORDER — PROPOFOL 10 MG/ML IV BOLUS
INTRAVENOUS | Status: DC | PRN
  Administered 2023-05-23 (×2): 50 mg via INTRAVENOUS
  Administered 2023-05-23: 100 mg via INTRAVENOUS

## 2023-05-23 SURGICAL SUPPLY — 37 items
BENZOIN TINCTURE PRP APPL 2/3 (GAUZE/BANDAGES/DRESSINGS) IMPLANT
BLADE SURG 10 STRL SS (BLADE) IMPLANT
BLADE SURG 15 STRL LF DISP TIS (BLADE) ×1 IMPLANT
BNDG ELASTIC 4INX 5YD STR LF (GAUZE/BANDAGES/DRESSINGS) IMPLANT
CANISTER SUCT 1200ML W/VALVE (MISCELLANEOUS) IMPLANT
CHLORAPREP W/TINT 26 (MISCELLANEOUS) ×1 IMPLANT
COVER BACK TABLE 60X90IN (DRAPES) ×1 IMPLANT
COVER MAYO STAND STRL (DRAPES) ×1 IMPLANT
DERMABOND ADVANCED .7 DNX12 (GAUZE/BANDAGES/DRESSINGS) IMPLANT
DRAPE LAPAROTOMY 100X72 PEDS (DRAPES) ×1 IMPLANT
DRAPE UTILITY XL STRL (DRAPES) ×1 IMPLANT
ELECT COATED BLADE 2.86 ST (ELECTRODE) ×1 IMPLANT
ELECTRODE REM PT RTRN 9FT ADLT (ELECTROSURGICAL) ×1 IMPLANT
GAUZE SPONGE 4X4 12PLY STRL (GAUZE/BANDAGES/DRESSINGS) IMPLANT
GLOVE BIOGEL PI IND STRL 8 (GLOVE) ×1 IMPLANT
GLOVE ECLIPSE 8.0 STRL XLNG CF (GLOVE) ×1 IMPLANT
GOWN STRL REUS W/ TWL LRG LVL3 (GOWN DISPOSABLE) ×2 IMPLANT
GOWN STRL REUS W/ TWL XL LVL3 (GOWN DISPOSABLE) ×1 IMPLANT
NDL HYPO 25X1 1.5 SAFETY (NEEDLE) ×1 IMPLANT
NEEDLE HYPO 25X1 1.5 SAFETY (NEEDLE) ×1 IMPLANT
NS IRRIG 1000ML POUR BTL (IV SOLUTION) IMPLANT
PACK BASIN DAY SURGERY FS (CUSTOM PROCEDURE TRAY) ×1 IMPLANT
PENCIL SMOKE EVACUATOR (MISCELLANEOUS) ×1 IMPLANT
SLEEVE SCD COMPRESS KNEE MED (STOCKING) ×1 IMPLANT
SPIKE FLUID TRANSFER (MISCELLANEOUS) IMPLANT
SPONGE T-LAP 4X18 ~~LOC~~+RFID (SPONGE) IMPLANT
STAPLER SKIN PROX WIDE 3.9 (STAPLE) IMPLANT
STRIP CLOSURE SKIN 1/2X4 (GAUZE/BANDAGES/DRESSINGS) IMPLANT
SUT ETHILON 2 0 FS 18 (SUTURE) IMPLANT
SUT ETHILON 2 0 FSLX (SUTURE) IMPLANT
SUT MON AB 4-0 PC3 18 (SUTURE) ×1 IMPLANT
SUT VICRYL 3-0 CR8 SH (SUTURE) ×1 IMPLANT
SUT VICRYL AB 3 0 TIES (SUTURE) IMPLANT
SYR CONTROL 10ML LL (SYRINGE) ×1 IMPLANT
TOWEL GREEN STERILE FF (TOWEL DISPOSABLE) ×2 IMPLANT
TUBE CONNECTING 20X1/4 (TUBING) IMPLANT
YANKAUER SUCT BULB TIP NO VENT (SUCTIONS) IMPLANT

## 2023-05-23 NOTE — Anesthesia Procedure Notes (Signed)
 Procedure Name: LMA Insertion Date/Time: 05/23/2023 11:41 AM  Performed by: Noralyn Beams, CRNAPre-anesthesia Checklist: Patient identified, Emergency Drugs available, Suction available and Patient being monitored Patient Re-evaluated:Patient Re-evaluated prior to induction Oxygen Delivery Method: Circle system utilized Preoxygenation: Pre-oxygenation with 100% oxygen Induction Type: IV induction Ventilation: Mask ventilation without difficulty LMA: LMA inserted LMA Size: 4.0 Number of attempts: 1 Airway Equipment and Method: Bite block Placement Confirmation: positive ETCO2 Tube secured with: Tape Dental Injury: Teeth and Oropharynx as per pre-operative assessment

## 2023-05-23 NOTE — Anesthesia Preprocedure Evaluation (Addendum)
 Anesthesia Evaluation  Patient identified by MRN, date of birth, ID band Patient awake    Reviewed: Allergy & Precautions, NPO status , Patient's Chart, lab work & pertinent test results  Airway Mallampati: II  TM Distance: >3 FB Neck ROM: Full    Dental no notable dental hx. (+) Teeth Intact, Dental Advisory Given   Pulmonary neg pulmonary ROS   Pulmonary exam normal breath sounds clear to auscultation       Cardiovascular hypertension, Pt. on medications Normal cardiovascular exam Rhythm:Regular Rate:Normal  TTE 2017 normal EF, no significant valvular abnormalities   Neuro/Psych negative neurological ROS  negative psych ROS   GI/Hepatic negative GI ROS, Neg liver ROS,,,  Endo/Other  negative endocrine ROS    Renal/GU negative Renal ROS  negative genitourinary   Musculoskeletal  (+) Arthritis ,    Abdominal   Peds  Hematology negative hematology ROS (+)   Anesthesia Other Findings   Reproductive/Obstetrics                             Anesthesia Physical Anesthesia Plan  ASA: 2  Anesthesia Plan: General   Post-op Pain Management: Tylenol  PO (pre-op)*   Induction: Intravenous  PONV Risk Score and Plan: 3 and Ondansetron , Dexamethasone  and Treatment may vary due to age or medical condition  Airway Management Planned: LMA  Additional Equipment:   Intra-op Plan:   Post-operative Plan: Extubation in OR  Informed Consent: I have reviewed the patients History and Physical, chart, labs and discussed the procedure including the risks, benefits and alternatives for the proposed anesthesia with the patient or authorized representative who has indicated his/her understanding and acceptance.     Dental advisory given  Plan Discussed with: CRNA  Anesthesia Plan Comments:        Anesthesia Quick Evaluation

## 2023-05-23 NOTE — Op Note (Addendum)
 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  That was followed radiation therapy of the chest wall as well.  She developed ulceration in the right mastectomy scar noted about 6 months ago.  This was initially neglected but then brought to the attention.  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  The area measured about a centimeter to a centimeter and a half and was ulcerated.  Wide excision recommended for local control.  Risks and benefits reviewed.  Risk of bleeding, infection, the need to leave the wound open due to previous surgery radiation treatment treatments, wound complications, prolonged wound healing, and the need further treatments and/or procedures depending on operative findings.  Other risk include cardiovascular events and exacerbation of underlying medical problems.  Alternatives to surgery were discussed.  She had previous radiation therapy and was not a good candidate for any further radiation therapy.  Concern for medical therapy alone due to ulceration to the skin of this lesion.  She opted to have wide excision.  Description of procedure: The patient was met in the holding all questions were answered.  The operative site was marked.  She was then brought to the operative room.  She was placed upon upon the OR table.  After induction of general anesthesia, right chest wall prepped and draped in sterile fashion timeout performed.  The lesion was  identified and the area was infiltrated local anesthetic.  A fishmouth incision was used above and below the lesion to excise it down into the musculature of the chest wall to take the musculature with it down to the ribs.  The skin was extremely thick and not pliable at all due to previous radiation therapy.  Hemostasis achieved.  I did try to mobilize some of the skin flaps but again due to the previous radiation therapy and significant scarring, there is no pliability at all to the skin.  I closed the corners with interrupted #1 nylon sutures.  This closed it down about 2 cm there is still approximately a 5 cm gap between the skin edges that could not be bridged.  I opted to pack this for now.  With her previous history of radiation therapy also her poor overall health, I did not want to do anything too extensive right now.  She may require skin graft later but certainly an attempt to get this to heal would be warranted.  The area of excision was 5 cm x 5 cm in maximal diameter.  Wet-to-dry dressing placed.  Dry dressing applied.  All counts found to be correct.  The patient was awoke extubated taken to recovery in satisfactory condition.

## 2023-05-23 NOTE — Transfer of Care (Signed)
 Immediate Anesthesia Transfer of Care Note  Patient: Shawna [name]  Procedure(s) Performed: EXCISION, MASS, CHEST WALL (Right: Chest)  Patient Location: PACU  Anesthesia Type:General  Level of Consciousness: drowsy  Airway & Oxygen Therapy: Patient Spontanous Breathing and Patient connected to face mask oxygen  Post-op Assessment: Report given to RN and Post -op Vital signs reviewed and stable  Post vital signs: Reviewed and stable  Last Vitals:  Vitals Value Taken Time  BP 152/75 (96)   Temp    Pulse 90   Resp 14   SpO2 99     Last Pain:  Vitals:   05/23/23 0915  TempSrc: Oral  PainSc: 0-No pain      Patients Stated Pain Goal: 2 (05/23/23 0915)  Complications: No notable events documented.

## 2023-05-23 NOTE — Interval H&P Note (Signed)
 History and Physical Interval Note:  05/23/2023 10:57 AM  Shawna [name]  has presented today for surgery, with the diagnosis of BREAST CANCER RIGHT.  The various methods of treatment have been discussed with the patient and family. After consideration of risks, benefits and other options for treatment, the patient has consented to  Procedure(s) with comments: EXCISION, MASS, CHEST WALL (Right) - WIDE EXCISION RIGHT CHEST WALL BREAST CANCER as a surgical intervention.  The patient's history has been reviewed, patient examined, no change in status, stable for surgery.  I have reviewed the patient's chart and labs.  Questions were answered to the patient's satisfaction.     Shelanda Duvall A Yoshika Vensel

## 2023-05-23 NOTE — Discharge Instructions (Addendum)
 Will try to set up home health nursing for wound care    Post Anesthesia Home Care Instructions  Activity: Get plenty of rest for the remainder of the day. A responsible individual must stay with you for 24 hours following the procedure.  For the next 24 hours, DO NOT: -Drive a car -Advertising copywriter -Drink alcoholic beverages -Take any medication unless instructed by your physician -Make any legal decisions or sign important papers.  Meals: Start with liquid foods such as gelatin or soup. Progress to regular foods as tolerated. Avoid greasy, spicy, heavy foods. If nausea and/or vomiting occur, drink only clear liquids until the nausea and/or vomiting subsides. Call your physician if vomiting continues.  Special Instructions/Symptoms: Your throat may feel dry or sore from the anesthesia or the breathing tube placed in your throat during surgery. If this causes discomfort, gargle with warm salt water. The discomfort should disappear within 24 hours.  Next dose of Tylenol  can be taken at 330pm. Last dose of Oxycodone  5mg  given at 1:30 PM

## 2023-05-23 NOTE — H&P (Signed)
 History of Present Illness: Shawna [name] is a 83 y.o. 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 The scan revealed a 1.3 cm nodule with high uptake, which was already known to be breast cancer. Patient has a history of right breast lumpectomy in 2017 with sent lymph node mapping followed by right mastectomy in 2019 due to recurrence. She presents with a recurrence in her right chest wall. She was seen by medical oncology. This area has been ulcerated for at least 6 months. PET scan showed return of cancer in right medial chest down to the muscle. No bone involvement. No distant metastatic disease.    Review of Systems: A complete review of systems was obtained from the patient. I have reviewed this information and discussed as appropriate with the patient. See HPI as well for other ROS.    Medical History: Past Medical History:  Diagnosis Date  Arthritis  History of cancer   There is no problem list on file for this patient.  Past Surgical History:  Procedure Laterality Date  MASTECTOMY    No Known Allergies  Current Outpatient Medications on File Prior to Visit  Medication Sig Dispense Refill  acetaminophen  (TYLENOL ) 650 MG ER tablet Take by mouth  calcium  carbonate-vitamin D3 500 mg-2.5 mcg (100 unit) Chew  cholecalciferol (VITAMIN D3) 1000 unit tablet Take by mouth  estradioL  (ESTRACE ) 0.01 % (0.1 mg/gram) vaginal cream USE 1 GRAM VAGINALLY 2 TIMES A WEEK AT BEDTIME  hydroCHLOROthiazide  (MICROZIDE ) 12.5 mg capsule Take by mouth  losartan  (COZAAR ) 100 MG tablet Take 1 tablet (100 mg total) by mouth once daily  multivitamin tablet Take 1 tablet by mouth once daily  peg 400-propylene glycol, PF, (SYSTANE, PF,) 0.4-0.3 % ophthalmic drops Apply to eye  rosuvastatin  (CRESTOR ) 20 MG tablet Take 1 tablet (20 mg total) by mouth once daily  vibegron (GEMTESA) 75 mg Tab 1  tablet Orally Once a day   No current facility-administered medications on file prior to visit.   Family History  Problem Relation Age of Onset  Breast cancer Mother  Hyperlipidemia (Elevated cholesterol) Father  Myocardial Infarction (Heart attack) Father  Skin cancer Sister  Colon cancer Sister  Breast cancer Sister  Skin cancer Brother    Social History   Tobacco Use  Smoking Status Never  Smokeless Tobacco Never    Social History   Socioeconomic History  Marital status: Widowed  Tobacco Use  Smoking status: Never  Smokeless tobacco: Never  Substance and Sexual Activity  Alcohol use: Not Currently  Drug use: Defer   Social Drivers of Health   Transportation Needs: No Transportation Needs (11/07/2017)  Received from [facility], [place]  PRAPARE - Transportation  Lack of Transportation (Medical): No  Lack of Transportation (Non-Medical): No  Housing Stability: Unknown (05/08/2023)  Housing Stability Vital Sign  Homeless in the Last Year: No   Objective:   Vitals:  05/08/23 1020  PainSc: 0-No pain  PainLoc: Chest   There is no height or weight on file to calculate BMI.  Physical Exam Vitals reviewed. Exam conducted with a chaperone present.  Cardiovascular:  Rate and Rhythm: Normal rate.  Pulmonary:  Effort: Pulmonary effort is normal.  Chest:  Breasts: Left: Normal.   Comments: Ulcerated 3 cm lesion right medial chest wall. Scar for mastectomy noted. Lymphadenopathy:  Upper Body:  Right upper body: No supraclavicular or axillary adenopathy.  Left  upper body: No supraclavicular or axillary adenopathy.  Neurological:  General: No focal deficit present.  Mental Status: She is alert.  Psychiatric:  Mood and Affect: Mood normal.     Labs, Imaging and Diagnostic Testing:  CLINICAL DATA: Subsequent treatment strategy for breast carcinoma. Stage IV breast carcinoma.  EXAM: NUCLEAR MEDICINE PET SKULL BASE TO THIGH  TECHNIQUE: 7.5 mCi  F-18 FDG was injected intravenously. Full-ring PET imaging was performed from the skull base to thigh after the radiotracer. CT data was obtained and used for attenuation correction and anatomic localization.  Fasting blood glucose: 93 mg/dl  COMPARISON: Chest CT 16/10/9602  FINDINGS: NECK: No hypermetabolic lymph nodes in the neck.  Incidental CT findings: None.  CHEST: Post RIGHT mastectomy anatomy. In the medial RIGHT chest wall adjacent to the sternum, intensely hypermetabolic nodule measuring 13 mm on image 88 with SUV max equal 10.7.  There is consolidation with air bronchograms in the RIGHT lung apex consistent with post radiation change with no significant metabolic activity (image 49/4  There is subpleural thickening in the RIGHT upper lobe related to prior radiation therapy. There several anterior RIGHT chest wall rib fractures prior related to surgery and radiation in the anterior RIGHT upper lobe.  No suspicious pulmonary nodules.  No clear hypermetabolic mediastinal lymph nodes. No pericardial fluid.  There is intense paraspinal uptake which is symmetric along the thoracic spine. This is favored benign hypermetabolic brown fat. More focal uptake in the posterior LEFT chest wall on image 43 is favored related to brown fat also.  Incidental CT findings: None.  ABDOMEN/PELVIS: No abnormal hypermetabolic activity within the liver, pancreas, adrenal glands, or spleen. No hypermetabolic lymph nodes in the abdomen or pelvis.  Incidental CT findings: Pessary device noted. Benign renal cysts. Uterus and adnexa unremarkable.  SKELETON: No focal hypermetabolic activity to suggest skeletal metastasis.  Incidental CT findings: None.  IMPRESSION: 1. Hypermetabolic nodule in the medial RIGHT chest wall adjacent to the sternum is concerning for local breast carcinoma recurrence. Recommend ultrasound-guided percutaneous sampling. 2. Metabolic activity along thoracic  spine is favored benign paraspinal brown fat. 3. Post radiation changes in the RIGHT upper lobe with associated rib fractures. No evidence of local recurrence in lung. 4. No evidence of metastatic disease in the abdomen pelvis.   Electronically Signed By: Deboraha Fallow M.D. On: 05/03/2023 17:03 Core biopsy shows poorly differentiated metastatic carcinoma favoring breast  Assessment and Plan:   Diagnoses and all orders for this visit:  Chest wall recurrence of breast cancer, right (CMS/HHS-HCC)    No evidence of metastatic disease other than chest wall.  Recommend wide excision right chest wall recurrence. Risk and benefits reviewed. Risk of bleeding, infection, poor wound healing, packing the wound open, cosmetic deformity and need for other additional procedures and/or treatments reviewed today.  No follow-ups on file.  Sharlee Dawes, MD

## 2023-05-24 ENCOUNTER — Encounter (HOSPITAL_BASED_OUTPATIENT_CLINIC_OR_DEPARTMENT_OTHER): Payer: Self-pay | Admitting: Surgery

## 2023-05-24 NOTE — Anesthesia Postprocedure Evaluation (Signed)
 Anesthesia Post Note  Patient: Shawna [name]  Procedure(s) Performed: EXCISION, MASS, CHEST WALL (Right: Chest)     Patient location during evaluation: PACU Anesthesia Type: General Level of consciousness: awake and alert Pain management: pain level controlled Vital Signs Assessment: post-procedure vital signs reviewed and stable Respiratory status: spontaneous breathing, nonlabored ventilation, respiratory function stable and patient connected to nasal cannula oxygen Cardiovascular status: blood pressure returned to baseline and stable Postop Assessment: no apparent nausea or vomiting Anesthetic complications: no  No notable events documented.  Last Vitals:  Vitals:   05/23/23 1300 05/23/23 1329  BP: (!) 151/83 (!) 150/88  Pulse: 69 76  Resp: 14 16  Temp:  (!) 36.2 C  SpO2: 99% 98%    Last Pain:  Vitals:   05/23/23 1326  TempSrc:   PainSc: 3                  Erie Sica L Briscoe Daniello

## 2023-05-25 DIAGNOSIS — D630 Anemia in neoplastic disease: Secondary | ICD-10-CM | POA: Diagnosis not present

## 2023-05-25 DIAGNOSIS — Z96651 Presence of right artificial knee joint: Secondary | ICD-10-CM | POA: Diagnosis not present

## 2023-05-25 DIAGNOSIS — M7061 Trochanteric bursitis, right hip: Secondary | ICD-10-CM | POA: Diagnosis not present

## 2023-05-25 DIAGNOSIS — I10 Essential (primary) hypertension: Secondary | ICD-10-CM | POA: Diagnosis not present

## 2023-05-25 DIAGNOSIS — Z483 Aftercare following surgery for neoplasm: Secondary | ICD-10-CM | POA: Diagnosis not present

## 2023-05-25 DIAGNOSIS — Z9181 History of falling: Secondary | ICD-10-CM | POA: Diagnosis not present

## 2023-05-25 DIAGNOSIS — B351 Tinea unguium: Secondary | ICD-10-CM | POA: Diagnosis not present

## 2023-05-25 DIAGNOSIS — C7989 Secondary malignant neoplasm of other specified sites: Secondary | ICD-10-CM | POA: Diagnosis not present

## 2023-05-25 DIAGNOSIS — M1712 Unilateral primary osteoarthritis, left knee: Secondary | ICD-10-CM | POA: Diagnosis not present

## 2023-05-25 DIAGNOSIS — E7800 Pure hypercholesterolemia, unspecified: Secondary | ICD-10-CM | POA: Diagnosis not present

## 2023-05-25 DIAGNOSIS — I890 Lymphedema, not elsewhere classified: Secondary | ICD-10-CM | POA: Diagnosis not present

## 2023-05-25 DIAGNOSIS — C50311 Malignant neoplasm of lower-inner quadrant of right female breast: Secondary | ICD-10-CM | POA: Diagnosis not present

## 2023-05-25 DIAGNOSIS — Z9011 Acquired absence of right breast and nipple: Secondary | ICD-10-CM | POA: Diagnosis not present

## 2023-05-25 DIAGNOSIS — S21101D Unspecified open wound of right front wall of thorax without penetration into thoracic cavity, subsequent encounter: Secondary | ICD-10-CM | POA: Diagnosis not present

## 2023-05-25 DIAGNOSIS — L84 Corns and callosities: Secondary | ICD-10-CM | POA: Diagnosis not present

## 2023-05-25 DIAGNOSIS — Z85828 Personal history of other malignant neoplasm of skin: Secondary | ICD-10-CM | POA: Diagnosis not present

## 2023-05-25 LAB — SURGICAL PATHOLOGY

## 2023-05-26 ENCOUNTER — Encounter: Payer: Self-pay | Admitting: Surgery

## 2023-05-26 DIAGNOSIS — Z9181 History of falling: Secondary | ICD-10-CM | POA: Diagnosis not present

## 2023-05-26 DIAGNOSIS — I890 Lymphedema, not elsewhere classified: Secondary | ICD-10-CM | POA: Diagnosis not present

## 2023-05-26 DIAGNOSIS — Z483 Aftercare following surgery for neoplasm: Secondary | ICD-10-CM | POA: Diagnosis not present

## 2023-05-26 DIAGNOSIS — Z85828 Personal history of other malignant neoplasm of skin: Secondary | ICD-10-CM | POA: Diagnosis not present

## 2023-05-26 DIAGNOSIS — C50311 Malignant neoplasm of lower-inner quadrant of right female breast: Secondary | ICD-10-CM | POA: Diagnosis not present

## 2023-05-26 DIAGNOSIS — Z9011 Acquired absence of right breast and nipple: Secondary | ICD-10-CM | POA: Diagnosis not present

## 2023-05-26 DIAGNOSIS — D630 Anemia in neoplastic disease: Secondary | ICD-10-CM | POA: Diagnosis not present

## 2023-05-26 DIAGNOSIS — I10 Essential (primary) hypertension: Secondary | ICD-10-CM | POA: Diagnosis not present

## 2023-05-26 DIAGNOSIS — M7061 Trochanteric bursitis, right hip: Secondary | ICD-10-CM | POA: Diagnosis not present

## 2023-05-26 DIAGNOSIS — M1712 Unilateral primary osteoarthritis, left knee: Secondary | ICD-10-CM | POA: Diagnosis not present

## 2023-05-26 DIAGNOSIS — C7989 Secondary malignant neoplasm of other specified sites: Secondary | ICD-10-CM | POA: Diagnosis not present

## 2023-05-26 DIAGNOSIS — B351 Tinea unguium: Secondary | ICD-10-CM | POA: Diagnosis not present

## 2023-05-26 DIAGNOSIS — L84 Corns and callosities: Secondary | ICD-10-CM | POA: Diagnosis not present

## 2023-05-26 DIAGNOSIS — S21101D Unspecified open wound of right front wall of thorax without penetration into thoracic cavity, subsequent encounter: Secondary | ICD-10-CM | POA: Diagnosis not present

## 2023-05-26 DIAGNOSIS — Z96651 Presence of right artificial knee joint: Secondary | ICD-10-CM | POA: Diagnosis not present

## 2023-05-26 DIAGNOSIS — E7800 Pure hypercholesterolemia, unspecified: Secondary | ICD-10-CM | POA: Diagnosis not present

## 2023-05-29 ENCOUNTER — Telehealth: Payer: Self-pay | Admitting: Radiation Oncology

## 2023-05-29 ENCOUNTER — Encounter: Payer: Self-pay | Admitting: *Deleted

## 2023-05-29 DIAGNOSIS — C50311 Malignant neoplasm of lower-inner quadrant of right female breast: Secondary | ICD-10-CM

## 2023-05-29 NOTE — Telephone Encounter (Signed)
 Called pt to schedule FUN/SIM 4weeks post-sx. Pt was agreeable to appts but had questions about healing possibly causing delay for XRT. Pt states she was not able to be stitched and site is open wound at this time. Pt advised site will be packed to assist with healing soon. I advised her I would pass this information to provider and that care team stays in communication if there is a need for delay. We scheduled appt 6/3 at this time. Providers made aware of pt concern via inbasket message. Pt plans to keep f/u appt with Dr. Lee Public 5/22.

## 2023-05-31 DIAGNOSIS — C7989 Secondary malignant neoplasm of other specified sites: Secondary | ICD-10-CM | POA: Diagnosis not present

## 2023-05-31 DIAGNOSIS — M7061 Trochanteric bursitis, right hip: Secondary | ICD-10-CM | POA: Diagnosis not present

## 2023-05-31 DIAGNOSIS — D630 Anemia in neoplastic disease: Secondary | ICD-10-CM | POA: Diagnosis not present

## 2023-05-31 DIAGNOSIS — Z85828 Personal history of other malignant neoplasm of skin: Secondary | ICD-10-CM | POA: Diagnosis not present

## 2023-05-31 DIAGNOSIS — I890 Lymphedema, not elsewhere classified: Secondary | ICD-10-CM | POA: Diagnosis not present

## 2023-05-31 DIAGNOSIS — I10 Essential (primary) hypertension: Secondary | ICD-10-CM | POA: Diagnosis not present

## 2023-05-31 DIAGNOSIS — L84 Corns and callosities: Secondary | ICD-10-CM | POA: Diagnosis not present

## 2023-05-31 DIAGNOSIS — Z96651 Presence of right artificial knee joint: Secondary | ICD-10-CM | POA: Diagnosis not present

## 2023-05-31 DIAGNOSIS — M1712 Unilateral primary osteoarthritis, left knee: Secondary | ICD-10-CM | POA: Diagnosis not present

## 2023-05-31 DIAGNOSIS — E7800 Pure hypercholesterolemia, unspecified: Secondary | ICD-10-CM | POA: Diagnosis not present

## 2023-05-31 DIAGNOSIS — Z9181 History of falling: Secondary | ICD-10-CM | POA: Diagnosis not present

## 2023-05-31 DIAGNOSIS — S21101D Unspecified open wound of right front wall of thorax without penetration into thoracic cavity, subsequent encounter: Secondary | ICD-10-CM | POA: Diagnosis not present

## 2023-05-31 DIAGNOSIS — C50311 Malignant neoplasm of lower-inner quadrant of right female breast: Secondary | ICD-10-CM | POA: Diagnosis not present

## 2023-05-31 DIAGNOSIS — Z9011 Acquired absence of right breast and nipple: Secondary | ICD-10-CM | POA: Diagnosis not present

## 2023-05-31 DIAGNOSIS — B351 Tinea unguium: Secondary | ICD-10-CM | POA: Diagnosis not present

## 2023-05-31 DIAGNOSIS — Z483 Aftercare following surgery for neoplasm: Secondary | ICD-10-CM | POA: Diagnosis not present

## 2023-06-06 NOTE — Progress Notes (Incomplete)
 Location of Breast Cancer: Malignant Neoplasm of Lower-Inner Quadrant of Right Breast, Estrogen Receptor Negative Histology per Pathology Report:    Receptor Status: ER(0% Negative), PR (0% Negative), Her2-neu (Negative), Ki-67(70%)  Did patient present with symptoms (if so, please note symptoms) or was this found on screening mammography?: ***  Past/Anticipated interventions by surgeon, if any:  02/03/2015 Right Lumpectomy  03/07/2017 Right Mastectomy  05/23/2023 Wide Excision of Recurrent Right Breast Cancer to Chest Wall after Mastectomy  Past/Anticipated interventions by medical oncology, if any:  04/24/2023 Lee Public, MD    Lymphedema issues, if any:  {:18581} {t:21944}   Pain issues, if any:  {:18581} {PAIN DESCRIPTION:21022940}  SAFETY ISSUES: Prior radiation? {:18581} Pacemaker/ICD? {:18581} Possible current pregnancy?{:18581} Is the patient on methotrexate? {:18581}  Current Complaints / other details:  ***

## 2023-06-07 DIAGNOSIS — Z9011 Acquired absence of right breast and nipple: Secondary | ICD-10-CM | POA: Diagnosis not present

## 2023-06-07 DIAGNOSIS — M1712 Unilateral primary osteoarthritis, left knee: Secondary | ICD-10-CM | POA: Diagnosis not present

## 2023-06-07 DIAGNOSIS — C50311 Malignant neoplasm of lower-inner quadrant of right female breast: Secondary | ICD-10-CM | POA: Diagnosis not present

## 2023-06-07 DIAGNOSIS — C7989 Secondary malignant neoplasm of other specified sites: Secondary | ICD-10-CM | POA: Diagnosis not present

## 2023-06-07 DIAGNOSIS — Z483 Aftercare following surgery for neoplasm: Secondary | ICD-10-CM | POA: Diagnosis not present

## 2023-06-07 DIAGNOSIS — I890 Lymphedema, not elsewhere classified: Secondary | ICD-10-CM | POA: Diagnosis not present

## 2023-06-07 DIAGNOSIS — L84 Corns and callosities: Secondary | ICD-10-CM | POA: Diagnosis not present

## 2023-06-07 DIAGNOSIS — Z96651 Presence of right artificial knee joint: Secondary | ICD-10-CM | POA: Diagnosis not present

## 2023-06-07 DIAGNOSIS — Z85828 Personal history of other malignant neoplasm of skin: Secondary | ICD-10-CM | POA: Diagnosis not present

## 2023-06-07 DIAGNOSIS — M7061 Trochanteric bursitis, right hip: Secondary | ICD-10-CM | POA: Diagnosis not present

## 2023-06-07 DIAGNOSIS — Z9181 History of falling: Secondary | ICD-10-CM | POA: Diagnosis not present

## 2023-06-07 DIAGNOSIS — I10 Essential (primary) hypertension: Secondary | ICD-10-CM | POA: Diagnosis not present

## 2023-06-07 DIAGNOSIS — D630 Anemia in neoplastic disease: Secondary | ICD-10-CM | POA: Diagnosis not present

## 2023-06-07 DIAGNOSIS — S21101D Unspecified open wound of right front wall of thorax without penetration into thoracic cavity, subsequent encounter: Secondary | ICD-10-CM | POA: Diagnosis not present

## 2023-06-07 DIAGNOSIS — E7800 Pure hypercholesterolemia, unspecified: Secondary | ICD-10-CM | POA: Diagnosis not present

## 2023-06-07 DIAGNOSIS — B351 Tinea unguium: Secondary | ICD-10-CM | POA: Diagnosis not present

## 2023-06-08 ENCOUNTER — Encounter: Payer: Self-pay | Admitting: *Deleted

## 2023-06-08 ENCOUNTER — Inpatient Hospital Stay: Attending: Hematology and Oncology | Admitting: Hematology and Oncology

## 2023-06-08 VITALS — BP 155/82 | HR 75 | Temp 98.2°F | Resp 17 | Ht 68.0 in | Wt 149.2 lb

## 2023-06-08 DIAGNOSIS — C50311 Malignant neoplasm of lower-inner quadrant of right female breast: Secondary | ICD-10-CM | POA: Diagnosis not present

## 2023-06-08 DIAGNOSIS — C792 Secondary malignant neoplasm of skin: Secondary | ICD-10-CM | POA: Diagnosis not present

## 2023-06-08 DIAGNOSIS — Z171 Estrogen receptor negative status [ER-]: Secondary | ICD-10-CM | POA: Diagnosis not present

## 2023-06-08 DIAGNOSIS — Z853 Personal history of malignant neoplasm of breast: Secondary | ICD-10-CM | POA: Diagnosis not present

## 2023-06-08 DIAGNOSIS — T8189XA Other complications of procedures, not elsewhere classified, initial encounter: Secondary | ICD-10-CM | POA: Diagnosis not present

## 2023-06-08 NOTE — Progress Notes (Signed)
 Per MD request RN successfully faxed [employer] tissue testing orders on pt recent biopsy results.

## 2023-06-08 NOTE — Assessment & Plan Note (Signed)
 Right lumpectomy 02/03/2015: Invasive ductal carcinoma grade 2, 2.1 cm, with associated DCIS, margins are negative, 0/1 lymph node, ER 0%, PR 0%, HER-2 positive ratio 2.31, T2 N0 stage II a. (Right breast biopsy 3:30 position 12/09/2014: Invasive ductal carcinoma grade 3, ER 0%, PR 0%, HER-2 negative ratio 1.5, Ki-67 90%, 1 cm irregular mass T1C N0 stage IA clinical stage) Genetic counseling revealed NBN mutation   2. Adjuvant chemotherapy with TCH 6 cycles started 03/09/15 completed 06/22/15 took Herceptin  maintenance for 1 year until 02/15/2016 3. Followed by radiation therapy 08/11/2015 to 09/22/2015 4. Cutaneous recurrence: 03/07/2017: Right mastectomy: IDC grade 3, 2 foci spanning 1.2 cm and 1.1 cm, lymphovascular invasion is present including dermal lymphatics, margins negative, 0/2 lymph nodes negative, ER 0%, PR 0%, HER-2 negative, Ki-67 not done,pT4b (skin involvement) N0 stage IIIc 5. Adjuvant radiation therapy 11/14/2017-12/29/2017 ----------------------------------------------------------------------------------------------- Patient decided not to receive any further systemic chemotherapy. There is no role of antiestrogen therapy because she is triple negative.   Second Chest wall Recurrence: 04/10/2023: [facility] dermatology Dr. Rochelle Chu skin biopsy right chest wall: Poorly differentiated metastatic breast cancer ER 0%, PR 0%, Ki-67 70%, HER2 0 PET/CT 05/03/2023: Hypermetabolic nodule on the right chest wall.  No evidence of distant metastases 05/23/2023: Soft tissue chest wall excision: Grade 3 IDC with skin involvement, margins negative, ER 0%, PR 0%, Ki67 70%, HER2 0 negative   Treatment plan: Dr. Lurena Sally was consulted to discuss any role of radiation   We would like to do PD-L1 testing to determine if she would benefit from immunotherapy.  We can consider doing this in the final pathology or on the tissue from the dermatology biopsy. Return to clinic based upon additional testing.    Follow-up 1 month after surgery to discuss PD-L1 testing and determine adjuvant treatment plan.

## 2023-06-08 NOTE — Progress Notes (Signed)
 Patient Care Team: Margarete Sharps, MD as PCP - General (Internal Medicine) Juanita Norlander, MD as Consulting Physician (General Surgery) Cameron Cea, MD as Consulting Physician (Hematology and Oncology) Debbie Fails, Laura Polio, NP as Nurse Practitioner (Hematology and Oncology) Wanita Gutta, MD as Consulting Physician (Obstetrics and Gynecology) Auther Bo, RN as Oncology Nurse Navigator Alane Hsu, RN as Oncology Nurse Navigator  DIAGNOSIS:  Encounter Diagnosis  Name Primary?   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 Genes tested include:  ATM, BARD1, BRCA1, BRCA2, BRIP1, CDH1, CHEK2, EPCAM, FANCC, MLH1, MSH2, MSH6, NBN, PALB2, PMS2, PTEN, RAD51C, RAD51D, TP53, and XRCC2.   02/03/2015 Surgery   Right lumpectomy: Invasive ductal carcinoma grade 2, 2.1 cm, with associated DCIS, margins are negative, 0/1 lymph node, ER 0%, PR 0%, HER-2 positive ratio 2.31, T2 N0 stage II a   03/09/2015 - 06/22/2015 Chemotherapy   Adjuvant chemotherapy with TCH 6 followed by Herceptin  maintenance for 1 year   08/11/2015 - 09/23/2015 Radiation Therapy   Adjuvant radiation therapy Lurena Sally):  1) Right Breast / 50 Gy in 25 fractions ; 2) Right Breast Boost / 10 Gy in 5 fractions   01/30/2017 Relapse/Recurrence   Skin biopsy right breast: Metastatic breast cancer GCDFP strongly positive   02/15/2017 PET scan   Hypermetabolic focus of skin thickening  along the medial right breast/chest wall consistent with recurrent disease, no other hypermetabolic metastases identified.   03/07/2017 Surgery   Right mastectomy: IDC grade 3, 2 foci spanning 1.2 cm and 1.1 cm, lymphovascular invasion is present including dermal lymphatics, margins negative, 0/2 lymph nodes negative, ER 0%, PR 0%, HER-2 negative, Ki-67 not done,pT4b (skin involvement) N0 stage IIIc   10/12/2017 Surgery   Chest wall Excision: Breast cancer   11/14/2017 - 12/29/2017 Radiation Therapy   Adjuvant right chest wall XRT     CHIEF COMPLIANT:   HISTORY OF PRESENT ILLNESS:  History of Present Illness Shawna [name] is an 83 year old female who presents for follow-up after surgery and wound care management.  She experiences pain following surgery, with an open wound that is difficult to close. Wet to dry packing is performed twice daily, and she is transitioning to wound vacuum therapy with home healthcare assistance.  Pathology from the surgery shows aggressive grade three cancer cells with resection margins free of cancer. The cancer is estrogen receptor-negative and triple-negative. A special test for PD-L1 positivity and other mutations is pending, with results expected in a couple of weeks.  She is on an antibiotic regimen, taking medication twice daily for a week, though the specific antibiotic is unknown.     ALLERGIES:  has no known allergies.  MEDICATIONS:  Current Outpatient Medications  Medication Sig Dispense Refill   Calcium  500-2.5 MG-MCG CHEW      Cholecalciferol (VITAMIN D) 10 MCG/ML LIQD      CRESTOR  20 MG tablet Take 20 mg by mouth in the morning.  0   estradiol  (  ESTRACE ) 0.1 MG/GM vaginal cream USE 1 GRAM VAGINALLY 2 TIMES WEEKLY AT BEDTIME 42.5 g 0   hydrochlorothiazide  (HYDRODIURIL ) 12.5 MG tablet Take 12.5 mg by mouth in the morning.     losartan  (COZAAR ) 100 MG tablet Take 100 mg by mouth in the morning.  0   Multiple Vitamin (MULTIVITAMIN) tablet  Take 1 tablet by mouth daily.     oxyCODONE  (OXY IR/ROXICODONE ) 5 MG immediate release tablet Take 1 tablet (5 mg total) by mouth every 6 (six) hours as needed for severe pain (pain score 7-10). 15 tablet 0   Polyethyl Glyc-Propyl Glyc PF (SYSTANE PRESERVATIVE FREE) 0.4-0.3 % SOLN Place 1-2 drops into both eyes 3 (three) times daily as needed (dry/irritated eyes).     Vibegron (GEMTESA) 75 MG TABS 1 tablet Orally Once a day     No current facility-administered medications for this visit.    PHYSICAL EXAMINATION: ECOG PERFORMANCE STATUS: 1 - Symptomatic but completely ambulatory  Vitals:   06/08/23 1120  BP: (!) 155/82  Pulse: 75  Resp: 17  Temp: 98.2 F (36.8 C)  SpO2: 99%   Filed Weights   06/08/23 1120  Weight: 149 lb 3.2 oz (67.7 kg)    Physical Exam   (exam performed in the presence of a chaperone)  LABORATORY DATA:  I have reviewed the data as listed    Latest Ref Rng & Units 05/17/2023   11:30 AM 05/05/2021    3:46 AM 04/22/2021   11:22 AM  CMP  Glucose 70 - 99 mg/dL 83  188  85   BUN 8 - 23 mg/dL 26  28  28    Creatinine 0.44 - 1.00 mg/dL 4.16  6.06  3.01   Sodium 135 - 145 mmol/L 139  135  138   Potassium 3.5 - 5.1 mmol/L 4.0  3.8  3.9   Chloride 98 - 111 mmol/L 107  106  106   CO2 22 - 32 mmol/L 25  22  25    Calcium  8.9 - 10.3 mg/dL 9.1  8.1  9.6   Total Protein 6.5 - 8.1 g/dL   6.8   Total Bilirubin 0.3 - 1.2 mg/dL   0.5   Alkaline Phos 38 - 126 U/L   78   AST 15 - 41 U/L   25   ALT 0 - 44 U/L   23     Lab Results  Component Value Date   WBC 8.2 05/05/2021   HGB 11.2 (L) 05/05/2021   HCT 32.8 (L) 05/05/2021   MCV 96.8 05/05/2021   PLT 165 05/05/2021   NEUTROABS 2.9 05/14/2020    ASSESSMENT & PLAN:  Breast cancer of lower-inner quadrant of right female breast (HCC) Right lumpectomy 02/03/2015: Invasive ductal carcinoma grade 2, 2.1 cm, with associated DCIS, margins are negative, 0/1 lymph node, ER 0%, PR 0%, HER-2 positive ratio 2.31, T2 N0 stage II  a. (Right breast biopsy 3:30 position 12/09/2014: Invasive ductal carcinoma grade 3, ER 0%, PR 0%, HER-2 negative ratio 1.5, Ki-67 90%, 1 cm irregular mass T1C N0 stage IA clinical stage) Genetic counseling revealed NBN mutation   2. Adjuvant chemotherapy with TCH 6 cycles started 03/09/15 completed 06/22/15 took Herceptin  maintenance for 1 year until 02/15/2016 3. Followed by radiation therapy 08/11/2015 to 09/22/2015 4. Cutaneous recurrence: 03/07/2017: Right mastectomy: IDC grade 3, 2 foci spanning 1.2 cm and 1.1 cm, lymphovascular invasion is present including dermal lymphatics, margins negative, 0/2 lymph nodes negative, ER 0%, PR 0%, HER-2 negative, Ki-67  not done,pT4b (skin involvement) N0 stage IIIc 5. Adjuvant radiation therapy 11/14/2017-12/29/2017 ----------------------------------------------------------------------------------------------- Patient decided not to receive any further systemic chemotherapy. There is no role of antiestrogen therapy because she is triple negative.   Second Chest wall Recurrence: 04/10/2023: [facility] dermatology Dr. Rochelle Chu skin biopsy right chest wall: Poorly differentiated metastatic breast cancer ER 0%, PR 0%, Ki-67 70%, HER2 0 PET/CT 05/03/2023: Hypermetabolic nodule on the right chest wall.  No evidence of distant metastases 05/23/2023: Soft tissue chest wall excision: Grade 3 IDC with skin involvement, margins negative, ER 0%, PR 0%, Ki67 70%, HER2 0 negative   Treatment plan: Patient informed that she does not want to do radiation and therefore she would like us  to cancel her appointment with Dr. Lurena Sally.  I sent a message to Dr. Lurena Sally.  From the medical oncology standpoint you can do their simple your issue and we can help with facilitating referral to other places for surgery plastic surgery   We would like to do PD-L1 testing to determine if she would benefit from immunotherapy. Return to clinic based upon additional testing.   Follow-up 1 month after  surgery to discuss PD-L1 testing and determine adjuvant treatment plan. 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 Grade 3 tumor with clear margins. Awaiting PD-L1 and mutation results to guide treatment. She prefers to avoid chemotherapy and radiation. - Await PD-L1 and mutation testing results. - Consider immunotherapy if PD-L1 positive. - Avoid chemotherapy and radiation.  Open wound with delayed healing Open wound with delayed healing post-surgery. Managed with wet-to-dry packing, transitioning to wound VAC therapy. On antibiotic therapy to prevent infection. Previous radiation may delay healing. - Initiate wound VAC therapy. - Continue antibiotic therapy. - Monitor wound healing with home healthcare support.      No orders of the defined types were placed in this encounter.  The patient has a good understanding of the overall plan. she agrees with it. she will call with any problems that may develop before the next visit here. Total time spent: 30 mins including face to face time and time spent for planning, charting and co-ordination of care   Viinay K Corde Antonini, MD 06/08/23

## 2023-06-09 ENCOUNTER — Encounter: Payer: Self-pay | Admitting: *Deleted

## 2023-06-09 DIAGNOSIS — E7800 Pure hypercholesterolemia, unspecified: Secondary | ICD-10-CM | POA: Diagnosis not present

## 2023-06-09 DIAGNOSIS — Z9181 History of falling: Secondary | ICD-10-CM | POA: Diagnosis not present

## 2023-06-09 DIAGNOSIS — M7061 Trochanteric bursitis, right hip: Secondary | ICD-10-CM | POA: Diagnosis not present

## 2023-06-09 DIAGNOSIS — D630 Anemia in neoplastic disease: Secondary | ICD-10-CM | POA: Diagnosis not present

## 2023-06-09 DIAGNOSIS — C50311 Malignant neoplasm of lower-inner quadrant of right female breast: Secondary | ICD-10-CM | POA: Diagnosis not present

## 2023-06-09 DIAGNOSIS — C7989 Secondary malignant neoplasm of other specified sites: Secondary | ICD-10-CM | POA: Diagnosis not present

## 2023-06-09 DIAGNOSIS — Z483 Aftercare following surgery for neoplasm: Secondary | ICD-10-CM | POA: Diagnosis not present

## 2023-06-09 DIAGNOSIS — B351 Tinea unguium: Secondary | ICD-10-CM | POA: Diagnosis not present

## 2023-06-09 DIAGNOSIS — M1712 Unilateral primary osteoarthritis, left knee: Secondary | ICD-10-CM | POA: Diagnosis not present

## 2023-06-09 DIAGNOSIS — S21101D Unspecified open wound of right front wall of thorax without penetration into thoracic cavity, subsequent encounter: Secondary | ICD-10-CM | POA: Diagnosis not present

## 2023-06-09 DIAGNOSIS — I890 Lymphedema, not elsewhere classified: Secondary | ICD-10-CM | POA: Diagnosis not present

## 2023-06-09 DIAGNOSIS — I10 Essential (primary) hypertension: Secondary | ICD-10-CM | POA: Diagnosis not present

## 2023-06-09 DIAGNOSIS — L84 Corns and callosities: Secondary | ICD-10-CM | POA: Diagnosis not present

## 2023-06-09 DIAGNOSIS — Z9011 Acquired absence of right breast and nipple: Secondary | ICD-10-CM | POA: Diagnosis not present

## 2023-06-09 DIAGNOSIS — Z96651 Presence of right artificial knee joint: Secondary | ICD-10-CM | POA: Diagnosis not present

## 2023-06-09 DIAGNOSIS — Z85828 Personal history of other malignant neoplasm of skin: Secondary | ICD-10-CM | POA: Diagnosis not present

## 2023-06-13 ENCOUNTER — Inpatient Hospital Stay (HOSPITAL_BASED_OUTPATIENT_CLINIC_OR_DEPARTMENT_OTHER): Admitting: Hematology and Oncology

## 2023-06-13 ENCOUNTER — Telehealth: Payer: Self-pay

## 2023-06-13 DIAGNOSIS — C792 Secondary malignant neoplasm of skin: Secondary | ICD-10-CM

## 2023-06-13 DIAGNOSIS — Z171 Estrogen receptor negative status [ER-]: Secondary | ICD-10-CM | POA: Diagnosis not present

## 2023-06-13 DIAGNOSIS — Z9221 Personal history of antineoplastic chemotherapy: Secondary | ICD-10-CM

## 2023-06-13 DIAGNOSIS — Z1722 Progesterone receptor negative status: Secondary | ICD-10-CM | POA: Diagnosis not present

## 2023-06-13 DIAGNOSIS — C50311 Malignant neoplasm of lower-inner quadrant of right female breast: Secondary | ICD-10-CM | POA: Diagnosis not present

## 2023-06-13 DIAGNOSIS — Z923 Personal history of irradiation: Secondary | ICD-10-CM

## 2023-06-13 DIAGNOSIS — Z1732 Human epidermal growth factor receptor 2 negative status: Secondary | ICD-10-CM

## 2023-06-13 NOTE — Assessment & Plan Note (Signed)
 Right lumpectomy 02/03/2015: Invasive ductal carcinoma grade 2, 2.1 cm, with associated DCIS, margins are negative, 0/1 lymph node, ER 0%, PR 0%, HER-2 positive ratio 2.31, T2 N0 stage II a. (Right breast biopsy 3:30 position 12/09/2014: Invasive ductal carcinoma grade 3, ER 0%, PR 0%, HER-2 negative ratio 1.5, Ki-67 90%, 1 cm irregular mass T1C N0 stage IA clinical stage) Genetic counseling revealed NBN mutation   2. Adjuvant chemotherapy with TCH 6 cycles started 03/09/15 completed 06/22/15 took Herceptin  maintenance for 1 year until 02/15/2016 3. Followed by radiation therapy 08/11/2015 to 09/22/2015 4. Cutaneous recurrence: 03/07/2017: Right mastectomy: IDC grade 3, 2 foci spanning 1.2 cm and 1.1 cm, lymphovascular invasion is present including dermal lymphatics, margins negative, 0/2 lymph nodes negative, ER 0%, PR 0%, HER-2 negative, Ki-67 not done,pT4b (skin involvement) N0 stage IIIc 5. Adjuvant radiation therapy 11/14/2017-12/29/2017 ----------------------------------------------------------------------------------------------- Patient decided not to receive any further systemic chemotherapy. There is no role of antiestrogen therapy because she is triple negative.   Second Chest wall Recurrence: 04/10/2023: [facility] dermatology Dr. Rochelle Chu skin biopsy right chest wall: Poorly differentiated metastatic breast cancer ER 0%, PR 0%, Ki-67 70%, HER2 0 PET/CT 05/03/2023: Hypermetabolic nodule on the right chest wall.  No evidence of distant metastases  05/23/2023: Soft tissue chest wall excision: Grade 3 IDC with skin involvement, margins negative, ER 0%, PR 0%, Ki67 70%, HER2 0 negative [facility] molecular testing: Triple negative, HER2 0, TMB 4, PD-L1 negative, no actionable mutations  Discussion: Patient is not keen on going through chemo therapy.  Since there is no evidence of distant metastatic disease we can watch and monitor.  If the chest wall wound heals, we can consider adding radiation.  I  will connect with her in 1 month to discuss whether or not she has healed from these surgeries. For surveillance we will consider doing guardant reveal for MRD monitoring.

## 2023-06-13 NOTE — Progress Notes (Signed)
 HEMATOLOGY-ONCOLOGY TELEPHONE VISIT PROGRESS NOTE  I connected with our patient on 06/13/23 at  2:00 PM EDT by telephone and verified that I am speaking with the correct person using two identifiers.  I discussed the limitations, risks, security and privacy concerns of performing an evaluation and management service by telephone and the availability of in person appointments.  I also discussed with the patient that there may be a patient responsible charge related to this service. The patient expressed understanding and agreed to proceed.   History of Present Illness:  History of Present Illness Shawna [name] is an 83 year old female with triple negative breast cancer who presents for discussion of treatment options.  She has triple negative breast cancer confirmed by recent molecular testing, which did not identify any specific mutations for targeted therapy. She is hesitant to undergo traditional chemotherapy. An open wound on the chest wall is managed with a wound vacuum for nearly a week. A recent PET scan shows no evidence of disease elsewhere except for the chest wall. 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 Genes tested include:  ATM, BARD1, BRCA1, BRCA2, BRIP1, CDH1, CHEK2, EPCAM, FANCC, MLH1, MSH2, MSH6, NBN, PALB2, PMS2, PTEN, RAD51C, RAD51D, TP53, and XRCC2.   02/03/2015 Surgery   Right lumpectomy: Invasive ductal carcinoma grade 2, 2.1 cm, with associated DCIS, margins are negative, 0/1 lymph node, ER 0%, PR 0%, HER-2 positive  ratio 2.31, T2 N0 stage II a   03/09/2015 - 06/22/2015 Chemotherapy   Adjuvant chemotherapy with TCH 6 followed by Herceptin  maintenance for 1 year   08/11/2015 - 09/23/2015 Radiation Therapy   Adjuvant radiation therapy Lurena Sally):  1) Right Breast / 50 Gy in 25 fractions ; 2) Right Breast Boost / 10 Gy in 5 fractions   01/30/2017 Relapse/Recurrence   Skin biopsy right breast: Metastatic breast cancer GCDFP strongly positive   02/15/2017 PET scan   Hypermetabolic focus of skin thickening along the medial right breast/chest wall consistent with recurrent disease, no other hypermetabolic metastases identified.   03/07/2017 Surgery   Right mastectomy: IDC grade 3, 2 foci spanning 1.2 cm and 1.1 cm, lymphovascular invasion is present including dermal lymphatics, margins negative, 0/2 lymph nodes negative, ER 0%, PR 0%, HER-2 negative, Ki-67 not done,pT4b (skin involvement) N0 stage IIIc   10/12/2017 Surgery   Chest wall Excision: Breast cancer   11/14/2017 - 12/29/2017 Radiation Therapy   Adjuvant right chest wall XRT     REVIEW OF SYSTEMS:   Constitutional: Denies fevers, chills or abnormal weight loss All other systems were reviewed with the patient and are negative. 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 (Right breast biopsy 3:30 position 12/09/2014: Invasive ductal carcinoma grade 3, ER 0%, PR 0%, HER-2 negative ratio 1.5, Ki-67 90%, 1  cm irregular mass T1C N0 stage IA clinical stage) Genetic counseling revealed NBN mutation   2. Adjuvant chemotherapy with TCH 6 cycles started 03/09/15 completed 06/22/15 took Herceptin  maintenance for 1 year until 02/15/2016 3. Followed by radiation therapy 08/11/2015 to 09/22/2015 4. Cutaneous  recurrence: 03/07/2017: Right mastectomy: IDC grade 3, 2 foci spanning 1.2 cm and 1.1 cm, lymphovascular invasion is present including dermal lymphatics, margins negative, 0/2 lymph nodes negative, ER 0%, PR 0%, HER-2 negative, Ki-67 not done,pT4b (skin involvement) N0 stage IIIc 5. Adjuvant radiation therapy 11/14/2017-12/29/2017 ----------------------------------------------------------------------------------------------- Patient decided not to receive any further systemic chemotherapy. There is no role of antiestrogen therapy because she is triple negative.   Second Chest wall Recurrence: 04/10/2023: [facility] dermatology Dr. Rochelle Chu skin biopsy right chest wall: Poorly differentiated metastatic breast cancer ER 0%, PR 0%, Ki-67 70%, HER2 0 PET/CT 05/03/2023: Hypermetabolic nodule on the right chest wall.  No evidence of distant metastases  05/23/2023: Soft tissue chest wall excision: Grade 3 IDC with skin involvement, margins negative, ER 0%, PR 0%, Ki67 70%, HER2 0 negative [facility] molecular testing: Triple negative, HER2 0, TMB 4, PD-L1 negative, no actionable mutations  Discussion: Patient is not keen on going through chemo therapy.  Since there is no evidence of distant metastatic disease we can watch and monitor.  If the chest wall wound heals, we can consider adding radiation.  I will connect with her in 1 month to discuss whether or not she has healed from these surgeries. For surveillance we will consider doing guardant reveal for MRD monitoring.  --------------------------------- Assessment and Plan Assessment & Plan Triple negative breast cancer Molecular testing confirmed triple negative breast cancer without actionable mutations. PD-L1 absence negates immunotherapy benefit. Chaya Cord, an antibody-drug conjugate, is a chemotherapy option with manageable side effects. Radiation therapy may be considered post-wound healing, though delay reduces efficacy. Observation is advised due to clear  surgical margins and no PET scan findings except for the chest wall. - Consider Chaya Cord for chemotherapy. - Reassess radiation therapy post-wound healing. - Continue observation.  Open wound with delayed healing The wound is managed with a wound vac, with expected improvement. It currently precludes radiation therapy. - Continue wound vac therapy. - Reassess wound healing in one month.      I discussed the assessment and treatment plan with the patient. The patient was provided an opportunity to ask questions and all were answered. The patient agreed with the plan and demonstrated an understanding of the instructions. The patient was advised to call back or seek an in-person evaluation if the symptoms worsen or if the condition fails to improve as anticipated.   I provided 20 minutes of non-face-to-face time during this encounter.  This includes time for charting and coordination of care   Margert Sheerer, MD

## 2023-06-13 NOTE — Telephone Encounter (Signed)
 Patient left message on triage voicemail stating she had a procedure done today at another office. She has an appt for her aex & pessary check with Dr. Colvin Dec on 07-03-23 & wanted to know if she needs to reschedule that appointment.  Per patient she has been diagnosed with breast cancer again & had surgery on 05-23-23. She states they could not close her wound so she is wearing a wound vac. They believe it could take her 4 weeks or so before the skin & tissue heals. Patient asking if she can still come to her appt on 07-03-23 but just do a pessary check instead of aex with pessary check for now. Please advise.  Patient is aware that Dr. Colvin Dec is out of the office.

## 2023-06-15 NOTE — Telephone Encounter (Signed)
 Visit changed message left for patient to make her aware.

## 2023-06-16 ENCOUNTER — Encounter: Payer: Self-pay | Admitting: Hematology and Oncology

## 2023-06-16 DIAGNOSIS — D630 Anemia in neoplastic disease: Secondary | ICD-10-CM | POA: Diagnosis not present

## 2023-06-16 DIAGNOSIS — B351 Tinea unguium: Secondary | ICD-10-CM | POA: Diagnosis not present

## 2023-06-16 DIAGNOSIS — Z96651 Presence of right artificial knee joint: Secondary | ICD-10-CM | POA: Diagnosis not present

## 2023-06-16 DIAGNOSIS — C7989 Secondary malignant neoplasm of other specified sites: Secondary | ICD-10-CM | POA: Diagnosis not present

## 2023-06-16 DIAGNOSIS — Z9181 History of falling: Secondary | ICD-10-CM | POA: Diagnosis not present

## 2023-06-16 DIAGNOSIS — Z483 Aftercare following surgery for neoplasm: Secondary | ICD-10-CM | POA: Diagnosis not present

## 2023-06-16 DIAGNOSIS — E7800 Pure hypercholesterolemia, unspecified: Secondary | ICD-10-CM | POA: Diagnosis not present

## 2023-06-16 DIAGNOSIS — Z85828 Personal history of other malignant neoplasm of skin: Secondary | ICD-10-CM | POA: Diagnosis not present

## 2023-06-16 DIAGNOSIS — M7061 Trochanteric bursitis, right hip: Secondary | ICD-10-CM | POA: Diagnosis not present

## 2023-06-16 DIAGNOSIS — M1712 Unilateral primary osteoarthritis, left knee: Secondary | ICD-10-CM | POA: Diagnosis not present

## 2023-06-16 DIAGNOSIS — I10 Essential (primary) hypertension: Secondary | ICD-10-CM | POA: Diagnosis not present

## 2023-06-16 DIAGNOSIS — L84 Corns and callosities: Secondary | ICD-10-CM | POA: Diagnosis not present

## 2023-06-16 DIAGNOSIS — S21101D Unspecified open wound of right front wall of thorax without penetration into thoracic cavity, subsequent encounter: Secondary | ICD-10-CM | POA: Diagnosis not present

## 2023-06-16 DIAGNOSIS — C50311 Malignant neoplasm of lower-inner quadrant of right female breast: Secondary | ICD-10-CM | POA: Diagnosis not present

## 2023-06-16 DIAGNOSIS — I890 Lymphedema, not elsewhere classified: Secondary | ICD-10-CM | POA: Diagnosis not present

## 2023-06-16 DIAGNOSIS — Z9011 Acquired absence of right breast and nipple: Secondary | ICD-10-CM | POA: Diagnosis not present

## 2023-06-19 ENCOUNTER — Encounter (HOSPITAL_COMMUNITY): Payer: Self-pay

## 2023-06-20 ENCOUNTER — Ambulatory Visit: Admitting: Radiation Oncology

## 2023-06-20 ENCOUNTER — Ambulatory Visit

## 2023-06-23 DIAGNOSIS — Z9181 History of falling: Secondary | ICD-10-CM | POA: Diagnosis not present

## 2023-06-23 DIAGNOSIS — L84 Corns and callosities: Secondary | ICD-10-CM | POA: Diagnosis not present

## 2023-06-23 DIAGNOSIS — D630 Anemia in neoplastic disease: Secondary | ICD-10-CM | POA: Diagnosis not present

## 2023-06-23 DIAGNOSIS — I890 Lymphedema, not elsewhere classified: Secondary | ICD-10-CM | POA: Diagnosis not present

## 2023-06-23 DIAGNOSIS — M7061 Trochanteric bursitis, right hip: Secondary | ICD-10-CM | POA: Diagnosis not present

## 2023-06-23 DIAGNOSIS — M1712 Unilateral primary osteoarthritis, left knee: Secondary | ICD-10-CM | POA: Diagnosis not present

## 2023-06-23 DIAGNOSIS — B351 Tinea unguium: Secondary | ICD-10-CM | POA: Diagnosis not present

## 2023-06-23 DIAGNOSIS — Z9011 Acquired absence of right breast and nipple: Secondary | ICD-10-CM | POA: Diagnosis not present

## 2023-06-23 DIAGNOSIS — Z85828 Personal history of other malignant neoplasm of skin: Secondary | ICD-10-CM | POA: Diagnosis not present

## 2023-06-23 DIAGNOSIS — C50311 Malignant neoplasm of lower-inner quadrant of right female breast: Secondary | ICD-10-CM | POA: Diagnosis not present

## 2023-06-23 DIAGNOSIS — Z96651 Presence of right artificial knee joint: Secondary | ICD-10-CM | POA: Diagnosis not present

## 2023-06-23 DIAGNOSIS — Z483 Aftercare following surgery for neoplasm: Secondary | ICD-10-CM | POA: Diagnosis not present

## 2023-06-23 DIAGNOSIS — I10 Essential (primary) hypertension: Secondary | ICD-10-CM | POA: Diagnosis not present

## 2023-06-23 DIAGNOSIS — S21101D Unspecified open wound of right front wall of thorax without penetration into thoracic cavity, subsequent encounter: Secondary | ICD-10-CM | POA: Diagnosis not present

## 2023-06-23 DIAGNOSIS — E7800 Pure hypercholesterolemia, unspecified: Secondary | ICD-10-CM | POA: Diagnosis not present

## 2023-06-23 DIAGNOSIS — C7989 Secondary malignant neoplasm of other specified sites: Secondary | ICD-10-CM | POA: Diagnosis not present

## 2023-06-27 DIAGNOSIS — Z96651 Presence of right artificial knee joint: Secondary | ICD-10-CM | POA: Diagnosis not present

## 2023-06-27 DIAGNOSIS — D630 Anemia in neoplastic disease: Secondary | ICD-10-CM | POA: Diagnosis not present

## 2023-06-27 DIAGNOSIS — I890 Lymphedema, not elsewhere classified: Secondary | ICD-10-CM | POA: Diagnosis not present

## 2023-06-27 DIAGNOSIS — S21101D Unspecified open wound of right front wall of thorax without penetration into thoracic cavity, subsequent encounter: Secondary | ICD-10-CM | POA: Diagnosis not present

## 2023-06-27 DIAGNOSIS — Z9011 Acquired absence of right breast and nipple: Secondary | ICD-10-CM | POA: Diagnosis not present

## 2023-06-27 DIAGNOSIS — L84 Corns and callosities: Secondary | ICD-10-CM | POA: Diagnosis not present

## 2023-06-27 DIAGNOSIS — B351 Tinea unguium: Secondary | ICD-10-CM | POA: Diagnosis not present

## 2023-06-27 DIAGNOSIS — I10 Essential (primary) hypertension: Secondary | ICD-10-CM | POA: Diagnosis not present

## 2023-06-27 DIAGNOSIS — M7061 Trochanteric bursitis, right hip: Secondary | ICD-10-CM | POA: Diagnosis not present

## 2023-06-27 DIAGNOSIS — M1712 Unilateral primary osteoarthritis, left knee: Secondary | ICD-10-CM | POA: Diagnosis not present

## 2023-06-27 DIAGNOSIS — Z483 Aftercare following surgery for neoplasm: Secondary | ICD-10-CM | POA: Diagnosis not present

## 2023-06-27 DIAGNOSIS — C7989 Secondary malignant neoplasm of other specified sites: Secondary | ICD-10-CM | POA: Diagnosis not present

## 2023-06-27 DIAGNOSIS — Z9181 History of falling: Secondary | ICD-10-CM | POA: Diagnosis not present

## 2023-06-27 DIAGNOSIS — C50311 Malignant neoplasm of lower-inner quadrant of right female breast: Secondary | ICD-10-CM | POA: Diagnosis not present

## 2023-06-27 DIAGNOSIS — E7800 Pure hypercholesterolemia, unspecified: Secondary | ICD-10-CM | POA: Diagnosis not present

## 2023-06-27 DIAGNOSIS — Z85828 Personal history of other malignant neoplasm of skin: Secondary | ICD-10-CM | POA: Diagnosis not present

## 2023-07-03 ENCOUNTER — Encounter: Payer: Self-pay | Admitting: Obstetrics and Gynecology

## 2023-07-03 ENCOUNTER — Ambulatory Visit (INDEPENDENT_AMBULATORY_CARE_PROVIDER_SITE_OTHER): Payer: Medicare Other | Admitting: Obstetrics and Gynecology

## 2023-07-03 VITALS — BP 124/76 | HR 97

## 2023-07-03 DIAGNOSIS — Z4689 Encounter for fitting and adjustment of other specified devices: Secondary | ICD-10-CM

## 2023-07-03 DIAGNOSIS — Z853 Personal history of malignant neoplasm of breast: Secondary | ICD-10-CM

## 2023-07-03 DIAGNOSIS — N8111 Cystocele, midline: Secondary | ICD-10-CM

## 2023-07-03 DIAGNOSIS — N814 Uterovaginal prolapse, unspecified: Secondary | ICD-10-CM

## 2023-07-03 NOTE — Patient Instructions (Signed)
 Please stop using vaginal estrogen cream.   Try Replens, which you will place in the vagina with an applicator twice a week.  This is an over the counter medication.

## 2023-07-03 NOTE — Progress Notes (Signed)
 GYNECOLOGY  VISIT   HPI: 83 y.o.   Widowed  Caucasian female   G2P2002 with Patient's last menstrual period was 01/17/1982 (approximate).   here for: Pessary check.  Using a Gelhorn pessary 2 3/4 inches.    She uses vaginal estrogen cream.  She dos feel the prolapse drop, and then it is more difficult to void.  No vaginal bleeding or unusual discharge.  Bowel function is good.  BMs are lower but not diarrhea.   She had excision of a skin recurrence of her breast cancer of her skin and chest wall.  This is recurrent issue for her.  Her dermatologist did the initial biopsy.  The general surgeon then did the surgical excision 05/23/23.  Final pathology showed invasive ductal carcinoma, grade 3 with skin involvement.  No hormone receptor testing done.  Her wound is open.  Takes Tylenol  for pain.  Her PET scan 05/01/23 showed hypermetabolic nodule of the right chest wall and no other areas of concern.   Has started driving again.  Is doing some walking.  Her son lives with her.   GYNECOLOGIC HISTORY: Patient's last menstrual period was 01/17/1982 (approximate). Contraception:  PMP Menopausal hormone therapy:  Estrace   Last 2 paps:  03/06/15 neg  History of abnormal Pap or positive HPV:  no Mammogram:  03/13/23- BI-RADS 1        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, right (HCC) 02/24/2017    Anemia 11/03/2015   Edema 07/15/2015   Monoallelic mutation of NBN gene    Genetic testing 01/13/2015   Family history of breast cancer    Family history of colon cancer    Breast cancer of lower-inner quadrant of right female breast (HCC) 12/30/2014   Basal cell carcinoma of skin 10/30/2014   Abnormal results of liver function studies 10/30/2014   Callosity 10/30/2014   Age-related osteoporosis without current pathological fracture 07/31/2008   Essential hypertension 07/31/2008    Past Medical History:  Diagnosis Date   Arthritis    Breast cancer (HCC) 11/2014   Family history of breast cancer    Family history of colon cancer    History of kidney stones    History of radiation therapy 08/11/15- 09/23/2015   Right Breast   History of radiation therapy 11/13/17- 12/29/17   Right chest wall and IM nodes 50.04 Gy in 28 fractions, Right supraclavicular and PAB nodes 50.04 Gy in 28 fractions, Right Chest wall boost/ 10 Gy in 5 fractions.    Hyperlipidemia    Hypertension    Monoallelic mutation of NBN gene    Osteoporosis  Pessary maintenance    Pneumonia    Skin cancer of nose     Past Surgical History:  Procedure Laterality Date   APPENDECTOMY     bilateral cataract surgery      BREAST LUMPECTOMY WITH RADIOACTIVE SEED AND SENTINEL LYMPH NODE BIOPSY Right 02/03/2015   Procedure: RIGHT BREAST LUMPECTOMY WITH RADIOACTIVE SEED AND RIGHT SENTINEL LYMPH NODE BIOPSY;  Surgeon: Juanita Norlander, MD;  Location: [facility];  Service: General;  Laterality: Right;   BREAST SURGERY  11/2012   biopsy, benign breast tissue   COLONOSCOPY  06/2010   rec   LITHOTRIPSY     kidney stone on left side   MASS EXCISION Right 05/23/2023   Procedure: EXCISION, MASS, CHEST WALL;  Surgeon: Sim Dryer, MD;  Location: [facility];  Service: General;  Laterality: Right;  WIDE EXCISION RIGHT CHEST WALL BREAST CANCER   MASTECTOMY W/ SENTINEL NODE BIOPSY Right 03/07/2017    Procedure: RIGHT TOTAL MASTECTOMY WITH RIGHT AXILLARY SENTINEL LYMPH NODE BIOPSY;  Surgeon: Juanita Norlander, MD;  Location: [facility] OR;  Service: General;  Laterality: Right;   OVARIAN CYST REMOVAL  age 72   POLYPECTOMY  04/2005   colon   PORTACATH PLACEMENT Left 02/03/2015   Procedure: INSERTION PORT-A-CATH WITH ULTRA SOUND GUIDANCE;  Surgeon: Juanita Norlander, MD;  Location: [facility];  Service: General;  Laterality: Left;   portacath removal      reexcision  10/09/2017   Dr. Odean Bend, chest wall excision for recurrent breast cancer.    TOTAL KNEE ARTHROPLASTY Right 05/04/2021   Procedure: TOTAL KNEE ARTHROPLASTY;  Surgeon: Claiborne Crew, MD;  Location: [facility];  Service: Orthopedics;  Laterality: Right;    Current Outpatient Medications  Medication Sig Dispense Refill   Ascorbic Acid (VITAMIN C PO) Take by mouth.     Calcium  500-2.5 MG-MCG CHEW      Cholecalciferol (VITAMIN D) 10 MCG/ML LIQD      CRESTOR  20 MG tablet Take 20 mg by mouth in the morning.  0   estradiol  (ESTRACE ) 0.1 MG/GM vaginal cream USE 1 GRAM VAGINALLY 2 TIMES WEEKLY AT BEDTIME 42.5 g 0   hydrochlorothiazide  (HYDRODIURIL ) 12.5 MG tablet Take 12.5 mg by mouth in the morning.     losartan  (COZAAR ) 100 MG tablet Take 100 mg by mouth in the morning.  0   Multiple Vitamin (MULTIVITAMIN) tablet Take 1 tablet by mouth daily.     Multiple Vitamins-Minerals (ZINC PO) Take by mouth.     Polyethyl Glyc-Propyl Glyc PF (SYSTANE PRESERVATIVE FREE) 0.4-0.3 % SOLN Place 1-2 drops into both eyes 3 (three) times daily as needed (dry/irritated eyes).     Vibegron (GEMTESA) 75 MG TABS 1 tablet Orally Once a day     No current facility-administered medications for this visit.     ALLERGIES: Patient has no known allergies.  Family History  Problem Relation Age of Onset   Colon cancer Sister 8       died at 70   Breast cancer Mother 37   Hypertension Mother    Osteoporosis Mother    Heart disease Father        CABG    Dementia Father    Throat cancer Brother 20   Breast cancer Sister 6   Lymphoma Sister 54   Melanoma Sister    Skin cancer Brother    Skin cancer Daughter     Social History   Socioeconomic History   Marital status: Widowed  Spouse name: Not on file   Number of children: 2   Years of education: Not on file   Highest education level: Not on file  Occupational History   Not on file  Tobacco Use   Smoking status: Never   Smokeless tobacco: Never  Vaping Use   Vaping status: Never Used  Substance and Sexual Activity   Alcohol  use: Never   Drug use: No   Sexual activity: Not Currently    Partners: Male    Birth control/protection: Post-menopausal  Other Topics Concern   Not on file  Social History Narrative   Not on file   Social Drivers of Health   Financial Resource Strain: Not on file  Food Insecurity: No Food Insecurity (05/19/2023)   Hunger Vital Sign    Worried About Running Out of Food in the Last Year: Never true    Ran Out of Food in the Last Year: Never true  Transportation Needs: No Transportation Needs (05/19/2023)   PRAPARE - Administrator, Civil Service (Medical): No    Lack of Transportation (Non-Medical): No  Physical Activity: Not on file  Stress: Not on file  Social Connections: Not on file  Intimate Partner Violence: Not At Risk (05/19/2023)   Humiliation, Afraid, Rape, and Kick questionnaire    Fear of Current or Ex-Partner: No    Emotionally Abused: No    Physically Abused: No    Sexually Abused: No    Review of Systems  All other systems reviewed and are negative.   PHYSICAL EXAMINATION:   BP 124/76 (BP Location: Left Arm, Patient Position: Sitting)   Pulse 97   LMP 01/17/1982 (Approximate)   SpO2 99%     General appearance: alert, cooperative and appears stated age  Pelvic: External genitalia:  no lesions              Urethra:  normal appearing urethra with no masses, tenderness or lesions              Bartholins and  Skenes: normal                 Vagina: normal appearing vagina with normal color and discharge, no lesions              Cervix: no lesions                Bimanual Exam:  Uterus:  normal size, contour, position, consistency, mobility, non-tender              Adnexa: no mass, fullness, tenderness     Gelhorn pessary removed, cleansed, and replaced.  Chaperone was present for exam:  Cottie Diss, CMA  ASSESSMENT:  Status post right mastectomy for breast cancer recurrence.  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  Status post excision of chest wall ductal carcinoma with skin involvement.  NBN gene mutation, one copy, which increases of breast cancer.  Cystocele and uterine prolapse.  Pessary maintenance.  Hx overactive bladder.     PLAN:  Continue pessary use.  Discontinue use of vaginal estrogen.  She will try Replens twice weekly.  Call for vaginal bleeding or vaginal discharge with odor.  FU in 3 months and prn.   36 min  total time was spent for this patient encounter, including preparation, face-to-face counseling with the patient, coordination of care, and documentation of the encounter.

## 2023-07-04 DIAGNOSIS — Z9011 Acquired absence of right breast and nipple: Secondary | ICD-10-CM | POA: Diagnosis not present

## 2023-07-04 DIAGNOSIS — Z483 Aftercare following surgery for neoplasm: Secondary | ICD-10-CM | POA: Diagnosis not present

## 2023-07-04 DIAGNOSIS — M7061 Trochanteric bursitis, right hip: Secondary | ICD-10-CM | POA: Diagnosis not present

## 2023-07-04 DIAGNOSIS — L84 Corns and callosities: Secondary | ICD-10-CM | POA: Diagnosis not present

## 2023-07-04 DIAGNOSIS — C7989 Secondary malignant neoplasm of other specified sites: Secondary | ICD-10-CM | POA: Diagnosis not present

## 2023-07-04 DIAGNOSIS — C50311 Malignant neoplasm of lower-inner quadrant of right female breast: Secondary | ICD-10-CM | POA: Diagnosis not present

## 2023-07-04 DIAGNOSIS — Z96651 Presence of right artificial knee joint: Secondary | ICD-10-CM | POA: Diagnosis not present

## 2023-07-04 DIAGNOSIS — I890 Lymphedema, not elsewhere classified: Secondary | ICD-10-CM | POA: Diagnosis not present

## 2023-07-04 DIAGNOSIS — D630 Anemia in neoplastic disease: Secondary | ICD-10-CM | POA: Diagnosis not present

## 2023-07-04 DIAGNOSIS — S21101D Unspecified open wound of right front wall of thorax without penetration into thoracic cavity, subsequent encounter: Secondary | ICD-10-CM | POA: Diagnosis not present

## 2023-07-04 DIAGNOSIS — Z9181 History of falling: Secondary | ICD-10-CM | POA: Diagnosis not present

## 2023-07-04 DIAGNOSIS — E7800 Pure hypercholesterolemia, unspecified: Secondary | ICD-10-CM | POA: Diagnosis not present

## 2023-07-04 DIAGNOSIS — Z85828 Personal history of other malignant neoplasm of skin: Secondary | ICD-10-CM | POA: Diagnosis not present

## 2023-07-04 DIAGNOSIS — B351 Tinea unguium: Secondary | ICD-10-CM | POA: Diagnosis not present

## 2023-07-04 DIAGNOSIS — I10 Essential (primary) hypertension: Secondary | ICD-10-CM | POA: Diagnosis not present

## 2023-07-04 DIAGNOSIS — M1712 Unilateral primary osteoarthritis, left knee: Secondary | ICD-10-CM | POA: Diagnosis not present

## 2023-07-06 ENCOUNTER — Encounter: Payer: Self-pay | Admitting: Hematology and Oncology

## 2023-07-10 DIAGNOSIS — L905 Scar conditions and fibrosis of skin: Secondary | ICD-10-CM | POA: Diagnosis not present

## 2023-07-10 DIAGNOSIS — L821 Other seborrheic keratosis: Secondary | ICD-10-CM | POA: Diagnosis not present

## 2023-07-10 DIAGNOSIS — D225 Melanocytic nevi of trunk: Secondary | ICD-10-CM | POA: Diagnosis not present

## 2023-07-10 DIAGNOSIS — L570 Actinic keratosis: Secondary | ICD-10-CM | POA: Diagnosis not present

## 2023-07-10 DIAGNOSIS — Z85828 Personal history of other malignant neoplasm of skin: Secondary | ICD-10-CM | POA: Diagnosis not present

## 2023-07-11 ENCOUNTER — Encounter (HOSPITAL_COMMUNITY): Payer: Self-pay

## 2023-07-11 DIAGNOSIS — Z9181 History of falling: Secondary | ICD-10-CM | POA: Diagnosis not present

## 2023-07-11 DIAGNOSIS — S21101D Unspecified open wound of right front wall of thorax without penetration into thoracic cavity, subsequent encounter: Secondary | ICD-10-CM | POA: Diagnosis not present

## 2023-07-11 DIAGNOSIS — I890 Lymphedema, not elsewhere classified: Secondary | ICD-10-CM | POA: Diagnosis not present

## 2023-07-11 DIAGNOSIS — I10 Essential (primary) hypertension: Secondary | ICD-10-CM | POA: Diagnosis not present

## 2023-07-11 DIAGNOSIS — B351 Tinea unguium: Secondary | ICD-10-CM | POA: Diagnosis not present

## 2023-07-11 DIAGNOSIS — Z85828 Personal history of other malignant neoplasm of skin: Secondary | ICD-10-CM | POA: Diagnosis not present

## 2023-07-11 DIAGNOSIS — C50311 Malignant neoplasm of lower-inner quadrant of right female breast: Secondary | ICD-10-CM | POA: Diagnosis not present

## 2023-07-11 DIAGNOSIS — E7800 Pure hypercholesterolemia, unspecified: Secondary | ICD-10-CM | POA: Diagnosis not present

## 2023-07-11 DIAGNOSIS — D630 Anemia in neoplastic disease: Secondary | ICD-10-CM | POA: Diagnosis not present

## 2023-07-11 DIAGNOSIS — L84 Corns and callosities: Secondary | ICD-10-CM | POA: Diagnosis not present

## 2023-07-11 DIAGNOSIS — Z9011 Acquired absence of right breast and nipple: Secondary | ICD-10-CM | POA: Diagnosis not present

## 2023-07-11 DIAGNOSIS — Z96651 Presence of right artificial knee joint: Secondary | ICD-10-CM | POA: Diagnosis not present

## 2023-07-11 DIAGNOSIS — M1712 Unilateral primary osteoarthritis, left knee: Secondary | ICD-10-CM | POA: Diagnosis not present

## 2023-07-11 DIAGNOSIS — Z483 Aftercare following surgery for neoplasm: Secondary | ICD-10-CM | POA: Diagnosis not present

## 2023-07-11 DIAGNOSIS — C7989 Secondary malignant neoplasm of other specified sites: Secondary | ICD-10-CM | POA: Diagnosis not present

## 2023-07-11 DIAGNOSIS — M7061 Trochanteric bursitis, right hip: Secondary | ICD-10-CM | POA: Diagnosis not present

## 2023-07-13 ENCOUNTER — Inpatient Hospital Stay: Attending: Hematology and Oncology | Admitting: Hematology and Oncology

## 2023-07-13 DIAGNOSIS — C50311 Malignant neoplasm of lower-inner quadrant of right female breast: Secondary | ICD-10-CM

## 2023-07-13 DIAGNOSIS — Z171 Estrogen receptor negative status [ER-]: Secondary | ICD-10-CM

## 2023-07-13 NOTE — Assessment & Plan Note (Signed)
 Right lumpectomy 02/03/2015: Invasive ductal carcinoma grade 2, 2.1 cm, with associated DCIS, margins are negative, 0/1 lymph node, ER 0%, PR 0%, HER-2 positive ratio 2.31, T2 N0 stage II a. (Right breast biopsy 3:30 position 12/09/2014: Invasive ductal carcinoma grade 3, ER 0%, PR 0%, HER-2 negative ratio 1.5, Ki-67 90%, 1 cm irregular mass T1C N0 stage IA clinical stage) Genetic counseling revealed NBN mutation   2. Adjuvant chemotherapy with TCH 6 cycles started 03/09/15 completed 06/22/15 took Herceptin  maintenance for 1 year until 02/15/2016 3. Followed by radiation therapy 08/11/2015 to 09/22/2015 4. Cutaneous recurrence: 03/07/2017: Right mastectomy: IDC grade 3, 2 foci spanning 1.2 cm and 1.1 cm, lymphovascular invasion is present including dermal lymphatics, margins negative, 0/2 lymph nodes negative, ER 0%, PR 0%, HER-2 negative, Ki-67 not done,pT4b (skin involvement) N0 stage IIIc 5. Adjuvant radiation therapy 11/14/2017-12/29/2017 ----------------------------------------------------------------------------------------------- Patient decided not to receive any further systemic chemotherapy. There is no role of antiestrogen therapy because she is triple negative.   Second Chest wall Recurrence: 04/10/2023: [facility] dermatology Dr. 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  No evidence of distant metastases   05/23/2023: Soft tissue chest wall excision: Grade 3 IDC with skin involvement, margins negative, ER 0%, PR 0%, Ki67 70%, HER2 0 negative [facility] molecular testing: Triple negative, HER2 0, TMB 4, PD-L1 negative, no actionable mutations   Discussion: Patient is not keen on going through chemo therapy.  Since there is no evidence of distant metastatic disease we can watch and monitor.  If the chest wall wound heals, we can consider adding radiation.   I  will connect with her in 1 month to discuss whether or not she has healed from these surgeries.

## 2023-07-13 NOTE — Progress Notes (Signed)
 HEMATOLOGY-ONCOLOGY TELEPHONE VISIT PROGRESS NOTE  I connected with our patient on 07/13/23 at  2:15 PM EDT by telephone and verified that I am speaking with the correct person using two identifiers.  I discussed the limitations, risks, security and privacy concerns of performing an evaluation and management service by telephone and the availability of in person appointments.  I also discussed with the patient that there may be a patient responsible charge related to this service. The patient expressed understanding and agreed to proceed.   History of Present Illness: Follow-up to evaluate progress regarding wound healing  History of Present Illness Shawna [name] is an 83 year old female who presents with slow wound healing and weight loss.  She experiences slow wound healing, with tissue still rebuilding. She previously used a wound vac machine but stopped due to management difficulties. Currently, she uses a wet-to-dry dressing, changing it daily, with weekly visits from a home health care nurse to check for infections. She attends weekly healthcare provider evaluations.  She has lost eight to nine pounds, attributing this to a lack of appetite. She consumes three meals daily but in very small portions.  She discontinued over-the-counter vitamins and calcium  before surgery and has not resumed them. She is currently taking over-the-counter zinc and vitamin C to aid in wound healing.    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 Genes tested include:  ATM, BARD1, BRCA1, BRCA2, BRIP1, CDH1,  CHEK2, EPCAM, FANCC, MLH1, MSH2, MSH6, NBN, PALB2, PMS2, PTEN, RAD51C, RAD51D, TP53, and XRCC2.   02/03/2015 Surgery   Right lumpectomy: Invasive ductal carcinoma grade 2, 2.1 cm, with associated DCIS, margins are negative, 0/1 lymph node, ER 0%, PR 0%, HER-2 positive ratio 2.31, T2 N0 stage II a   03/09/2015 - 06/22/2015 Chemotherapy   Adjuvant chemotherapy with TCH 6 followed by Herceptin  maintenance for 1 year   08/11/2015 - 09/23/2015 Radiation Therapy   Adjuvant radiation therapy Audry):  1) Right Breast / 50 Gy in 25 fractions ; 2) Right Breast Boost / 10 Gy in 5 fractions   01/30/2017 Relapse/Recurrence   Skin biopsy right breast: Metastatic breast cancer GCDFP strongly positive   02/15/2017 PET scan   Hypermetabolic focus of skin thickening along the medial right breast/chest wall consistent with recurrent disease, no other hypermetabolic metastases identified.   03/07/2017 Surgery   Right mastectomy: IDC grade 3, 2 foci spanning 1.2 cm and 1.1 cm, lymphovascular invasion is present including dermal lymphatics, margins negative, 0/2 lymph nodes negative, ER 0%, PR 0%, HER-2 negative, Ki-67 not done,pT4b (skin involvement) N0 stage IIIc   10/12/2017 Surgery   Chest wall Excision: Breast cancer   11/14/2017 - 12/29/2017 Radiation Therapy   Adjuvant right chest wall XRT     REVIEW OF SYSTEMS:   Constitutional: Denies fevers, chills or abnormal weight loss All other systems were reviewed with the patient and are negative. Observations/Objective:     Assessment Plan:  Breast cancer of lower-inner quadrant of right female breast (HCC) Right lumpectomy 02/03/2015: Invasive ductal carcinoma grade 2, 2.1 cm, with associated DCIS, margins are negative, 0/1  lymph node, ER 0%, PR 0%, HER-2 positive ratio 2.31, T2 N0 stage II a. (Right breast biopsy 3:30 position 12/09/2014: Invasive ductal carcinoma grade 3, ER 0%, PR 0%, HER-2 negative ratio 1.5, Ki-67 90%, 1 cm irregular mass T1C N0 stage  IA clinical stage) Genetic counseling revealed NBN mutation   2. Adjuvant chemotherapy with TCH 6 cycles started 03/09/15 completed 06/22/15 took Herceptin  maintenance for 1 year until 02/15/2016 3. Followed by radiation therapy 08/11/2015 to 09/22/2015 4. Cutaneous recurrence: 03/07/2017: Right mastectomy: IDC grade 3, 2 foci spanning 1.2 cm and 1.1 cm, lymphovascular invasion is present including dermal lymphatics, margins negative, 0/2 lymph nodes negative, ER 0%, PR 0%, HER-2 negative, Ki-67 not done,pT4b (skin involvement) N0 stage IIIc 5. Adjuvant radiation therapy 11/14/2017-12/29/2017 ----------------------------------------------------------------------------------------------- Patient decided not to receive any further systemic chemotherapy. There is no role of antiestrogen therapy because she is triple negative.   Second Chest wall Recurrence: 04/10/2023: [facility] dermatology Dr. 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  No evidence of distant metastases   05/23/2023: Soft tissue chest wall excision: Grade 3 IDC with skin involvement, margins negative, ER 0%, PR 0%, Ki67 70%, HER2 0 negative [facility] molecular testing: Triple negative, HER2 0, TMB 4, PD-L1 negative, no actionable mutations   Discussion: Patient is not keen on going through chemo therapy.  Since there is no evidence of distant metastatic disease we can watch and monitor.  Because she's not healing well, we decided not to do XRT Wound still not healed: wet to dry dressings   Weight Loss: 8-9 lbs 6 months follow up    I discussed the assessment and treatment plan with the patient. The patient was provided an opportunity to ask questions and all were answered. The patient agreed with the plan and demonstrated an understanding of the instructions. The patient was advised to call back or  seek an in-person evaluation if the symptoms worsen or if the condition fails to improve as anticipated.   I provided 20 minutes of non-face-to-face time during this encounter.  This includes time for charting and coordination of care   Naomi MARLA Chad, MD

## 2023-07-17 ENCOUNTER — Other Ambulatory Visit: Payer: Self-pay | Admitting: Surgery

## 2023-07-17 ENCOUNTER — Encounter: Payer: Self-pay | Admitting: Hematology and Oncology

## 2023-07-17 DIAGNOSIS — C44599 Other specified malignant neoplasm of skin of other part of trunk: Secondary | ICD-10-CM | POA: Diagnosis not present

## 2023-07-17 DIAGNOSIS — C50811 Malignant neoplasm of overlapping sites of right female breast: Secondary | ICD-10-CM | POA: Diagnosis not present

## 2023-07-17 DIAGNOSIS — I10 Essential (primary) hypertension: Secondary | ICD-10-CM | POA: Diagnosis not present

## 2023-07-17 DIAGNOSIS — C50311 Malignant neoplasm of lower-inner quadrant of right female breast: Secondary | ICD-10-CM | POA: Diagnosis not present

## 2023-07-19 DIAGNOSIS — C7989 Secondary malignant neoplasm of other specified sites: Secondary | ICD-10-CM | POA: Diagnosis not present

## 2023-07-19 DIAGNOSIS — B351 Tinea unguium: Secondary | ICD-10-CM | POA: Diagnosis not present

## 2023-07-19 DIAGNOSIS — C50311 Malignant neoplasm of lower-inner quadrant of right female breast: Secondary | ICD-10-CM | POA: Diagnosis not present

## 2023-07-19 DIAGNOSIS — I10 Essential (primary) hypertension: Secondary | ICD-10-CM | POA: Diagnosis not present

## 2023-07-19 DIAGNOSIS — Z483 Aftercare following surgery for neoplasm: Secondary | ICD-10-CM | POA: Diagnosis not present

## 2023-07-19 DIAGNOSIS — S21101D Unspecified open wound of right front wall of thorax without penetration into thoracic cavity, subsequent encounter: Secondary | ICD-10-CM | POA: Diagnosis not present

## 2023-07-19 DIAGNOSIS — D630 Anemia in neoplastic disease: Secondary | ICD-10-CM | POA: Diagnosis not present

## 2023-07-19 DIAGNOSIS — Z96651 Presence of right artificial knee joint: Secondary | ICD-10-CM | POA: Diagnosis not present

## 2023-07-19 DIAGNOSIS — Z85828 Personal history of other malignant neoplasm of skin: Secondary | ICD-10-CM | POA: Diagnosis not present

## 2023-07-19 DIAGNOSIS — Z9011 Acquired absence of right breast and nipple: Secondary | ICD-10-CM | POA: Diagnosis not present

## 2023-07-19 DIAGNOSIS — E7800 Pure hypercholesterolemia, unspecified: Secondary | ICD-10-CM | POA: Diagnosis not present

## 2023-07-19 DIAGNOSIS — I890 Lymphedema, not elsewhere classified: Secondary | ICD-10-CM | POA: Diagnosis not present

## 2023-07-19 DIAGNOSIS — L84 Corns and callosities: Secondary | ICD-10-CM | POA: Diagnosis not present

## 2023-07-19 DIAGNOSIS — Z9181 History of falling: Secondary | ICD-10-CM | POA: Diagnosis not present

## 2023-07-19 DIAGNOSIS — M7061 Trochanteric bursitis, right hip: Secondary | ICD-10-CM | POA: Diagnosis not present

## 2023-07-19 DIAGNOSIS — M1712 Unilateral primary osteoarthritis, left knee: Secondary | ICD-10-CM | POA: Diagnosis not present

## 2023-07-19 LAB — DERMATOLOGY PATHOLOGY

## 2023-07-20 ENCOUNTER — Ambulatory Visit: Payer: Self-pay | Admitting: Surgery

## 2023-07-24 ENCOUNTER — Telehealth: Payer: Self-pay | Admitting: *Deleted

## 2023-07-24 NOTE — Telephone Encounter (Signed)
 Per Dr. Gudena, called pt to make aware of request for telephone visit. Pt made aware and verbalized understanding.

## 2023-07-25 ENCOUNTER — Other Ambulatory Visit: Payer: Self-pay | Admitting: Obstetrics and Gynecology

## 2023-07-25 DIAGNOSIS — I10 Essential (primary) hypertension: Secondary | ICD-10-CM | POA: Diagnosis not present

## 2023-07-25 DIAGNOSIS — B351 Tinea unguium: Secondary | ICD-10-CM | POA: Diagnosis not present

## 2023-07-25 DIAGNOSIS — Z483 Aftercare following surgery for neoplasm: Secondary | ICD-10-CM | POA: Diagnosis not present

## 2023-07-25 DIAGNOSIS — E7800 Pure hypercholesterolemia, unspecified: Secondary | ICD-10-CM | POA: Diagnosis not present

## 2023-07-25 DIAGNOSIS — S21101D Unspecified open wound of right front wall of thorax without penetration into thoracic cavity, subsequent encounter: Secondary | ICD-10-CM | POA: Diagnosis not present

## 2023-07-25 DIAGNOSIS — C50311 Malignant neoplasm of lower-inner quadrant of right female breast: Secondary | ICD-10-CM | POA: Diagnosis not present

## 2023-07-25 DIAGNOSIS — I890 Lymphedema, not elsewhere classified: Secondary | ICD-10-CM | POA: Diagnosis not present

## 2023-07-25 DIAGNOSIS — Z96651 Presence of right artificial knee joint: Secondary | ICD-10-CM | POA: Diagnosis not present

## 2023-07-25 DIAGNOSIS — H539 Unspecified visual disturbance: Secondary | ICD-10-CM | POA: Diagnosis not present

## 2023-07-25 DIAGNOSIS — D630 Anemia in neoplastic disease: Secondary | ICD-10-CM | POA: Diagnosis not present

## 2023-07-25 DIAGNOSIS — Z9011 Acquired absence of right breast and nipple: Secondary | ICD-10-CM | POA: Diagnosis not present

## 2023-07-25 DIAGNOSIS — Z9181 History of falling: Secondary | ICD-10-CM | POA: Diagnosis not present

## 2023-07-25 DIAGNOSIS — L84 Corns and callosities: Secondary | ICD-10-CM | POA: Diagnosis not present

## 2023-07-25 DIAGNOSIS — M7061 Trochanteric bursitis, right hip: Secondary | ICD-10-CM | POA: Diagnosis not present

## 2023-07-25 DIAGNOSIS — N952 Postmenopausal atrophic vaginitis: Secondary | ICD-10-CM

## 2023-07-25 DIAGNOSIS — Z85828 Personal history of other malignant neoplasm of skin: Secondary | ICD-10-CM | POA: Diagnosis not present

## 2023-07-25 DIAGNOSIS — M1712 Unilateral primary osteoarthritis, left knee: Secondary | ICD-10-CM | POA: Diagnosis not present

## 2023-07-25 DIAGNOSIS — C7989 Secondary malignant neoplasm of other specified sites: Secondary | ICD-10-CM | POA: Diagnosis not present

## 2023-07-25 NOTE — Assessment & Plan Note (Signed)
 Right lumpectomy 02/03/2015: Invasive ductal carcinoma grade 2, 2.1 cm, with associated DCIS, margins are negative, 0/1 lymph node, ER 0%, PR 0%, HER-2 positive ratio 2.31, T2 N0 stage II a. (Right breast biopsy 3:30 position 12/09/2014: Invasive ductal carcinoma grade 3, ER 0%, PR 0%, HER-2 negative ratio 1.5, Ki-67 90%, 1 cm irregular mass T1C N0 stage IA clinical stage) Genetic counseling revealed NBN mutation   2. Adjuvant chemotherapy with TCH 6 cycles started 03/09/15 completed 06/22/15 took Herceptin  maintenance for 1 year until 02/15/2016 3. Followed by radiation therapy 08/11/2015 to 09/22/2015 4. Cutaneous recurrence: 03/07/2017: Right mastectomy: IDC grade 3, 2 foci spanning 1.2 cm and 1.1 cm, lymphovascular invasion is present including dermal lymphatics, margins negative, 0/2 lymph nodes negative, ER 0%, PR 0%, HER-2 negative, Ki-67 not done,pT4b (skin involvement) N0 stage IIIc 5. Adjuvant radiation therapy 11/14/2017-12/29/2017 ----------------------------------------------------------------------------------------------- Patient decided not to receive any further systemic chemotherapy. There is no role of antiestrogen therapy because she is triple negative.   Second Chest wall Recurrence: 04/10/2023: [facility] dermatology Dr. 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  No evidence of distant metastases   05/23/2023: Soft tissue chest wall excision: Grade 3 IDC with skin involvement, margins negative, ER 0%, PR 0%, Ki67 70%, HER2 0 negative [facility] molecular testing: Triple negative, HER2 0, TMB 4, PD-L1 negative, no actionable mutations Weight Loss: 8-9 lbs 6 months follow up

## 2023-07-25 NOTE — Telephone Encounter (Signed)
 Med refill request: estradiol  vaginal cream Last OV: 07/03/23 estradiol  vaginal cream discontinued Last AEX: 11/25/20 Next OV: 10/02/23 Last MMG (if hormonal med) 05/18/23 Refill denied.  Sent to provider for review.

## 2023-07-26 ENCOUNTER — Inpatient Hospital Stay: Attending: Hematology and Oncology | Admitting: Hematology and Oncology

## 2023-07-26 DIAGNOSIS — C50311 Malignant neoplasm of lower-inner quadrant of right female breast: Secondary | ICD-10-CM | POA: Diagnosis not present

## 2023-07-26 DIAGNOSIS — Z171 Estrogen receptor negative status [ER-]: Secondary | ICD-10-CM | POA: Diagnosis not present

## 2023-07-26 DIAGNOSIS — Z853 Personal history of malignant neoplasm of breast: Secondary | ICD-10-CM | POA: Insufficient documentation

## 2023-07-26 NOTE — Progress Notes (Signed)
 HEMATOLOGY-ONCOLOGY TELEPHONE VISIT PROGRESS NOTE  I connected with our patient on 07/26/23 at  9:00 AM EDT by telephone and verified that I am speaking with the correct person using two identifiers.  I discussed the limitations, risks, security and privacy concerns of performing an evaluation and management service by telephone and the availability of in person appointments.  I also discussed with the patient that there may be a patient responsible charge related to this service. The patient expressed understanding and agreed to proceed.   History of Present Illness:   History of Present Illness Shawna [name] is an 83 year old female with aggressive cancer who presents for follow-up regarding a recent skin biopsy. She was referred by Dr. Mae for evaluation of a suspicious mole near her wound site.  A biopsy performed on June 30th revealed low estrogen receptor positivity, consistent with previous surgical pathology. She has experienced significant weight loss of approximately ten pounds. She is attempting to increase her protein intake by consuming protein bars. No new symptoms or significant changes are noted.    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 Genes tested include:  ATM, BARD1, BRCA1, BRCA2, BRIP1, CDH1, CHEK2, EPCAM, FANCC, MLH1, MSH2, MSH6, NBN, PALB2, PMS2, PTEN, RAD51C, RAD51D, TP53, and XRCC2.   02/03/2015 Surgery   Right lumpectomy: Invasive ductal carcinoma grade 2, 2.1 cm, with associated DCIS, margins are negative, 0/1 lymph node, ER 0%, PR 0%, HER-2 positive ratio  2.31, T2 N0 stage II a   03/09/2015 - 06/22/2015 Chemotherapy   Adjuvant chemotherapy with TCH 6 followed by Herceptin  maintenance for 1 year   08/11/2015 - 09/23/2015 Radiation Therapy   Adjuvant radiation therapy Audry):  1) Right Breast / 50 Gy in 25 fractions ; 2) Right Breast Boost / 10 Gy in 5 fractions   01/30/2017 Relapse/Recurrence   Skin biopsy right breast: Metastatic breast cancer GCDFP strongly positive   02/15/2017 PET scan   Hypermetabolic focus of skin thickening along the medial right breast/chest wall consistent with recurrent disease, no other hypermetabolic metastases identified.   03/07/2017 Surgery   Right mastectomy: IDC grade 3, 2 foci spanning 1.2 cm and 1.1 cm, lymphovascular invasion is present including dermal lymphatics, margins negative, 0/2 lymph nodes negative, ER 0%, PR 0%, HER-2 negative, Ki-67 not done,pT4b (skin involvement) N0 stage IIIc   10/12/2017 Surgery   Chest wall Excision: Breast cancer   11/14/2017 - 12/29/2017 Radiation Therapy   Adjuvant right chest wall XRT     REVIEW OF SYSTEMS:   Constitutional: Denies fevers, chills or abnormal weight loss All other systems were reviewed with the patient and are negative.  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 (Right breast biopsy 3:30 position 12/09/2014: Invasive ductal carcinoma grade 3, ER 0%, PR 0%, HER-2 negative ratio 1.5, Ki-67 90%, 1 cm irregular mass T1C N0 stage  IA clinical stage) Genetic counseling revealed NBN mutation   2. Adjuvant chemotherapy with TCH 6 cycles started 03/09/15 completed 06/22/15 took Herceptin  maintenance for 1 year until 02/15/2016 3. Followed by radiation therapy 08/11/2015 to 09/22/2015 4. Cutaneous  recurrence: 03/07/2017: Right mastectomy: IDC grade 3, 2 foci spanning 1.2 cm and 1.1 cm, lymphovascular invasion is present including dermal lymphatics, margins negative, 0/2 lymph nodes negative, ER 0%, PR 0%, HER-2 negative, Ki-67 not done,pT4b (skin involvement) N0 stage IIIc 5. Adjuvant radiation therapy 11/14/2017-12/29/2017 ----------------------------------------------------------------------------------------------- Patient decided not to receive any further systemic chemotherapy. There is no role of antiestrogen therapy because she is triple negative.   Second Chest wall Recurrence: 04/10/2023: [facility] dermatology Dr. 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  No evidence of distant metastases   05/23/2023: Soft tissue chest wall excision: Grade 3 IDC with skin involvement, margins negative, ER 0%, PR 0%, Ki67 70%, HER2 0 negative [facility] molecular testing: Triple negative, HER2 0, TMB 4, PD-L1 negative, no actionable mutations Weight Loss: 8-9 lbs  Cutaneous recurrence: 07/17/23: Skin biopsy: Poorly Diff carcinoma ER: 5%, PR 0%, Ki 67: 80%, HER 2: 0 I discussed with her about different options including antiestrogen therapy (ER 5% unlikely to benefit) versus capecitabine.  I discussed risks and benefits of capecitabine and she is willing to consider taking it.  I will discuss with Dr. Vanderbilt if surgery is an option for the cutaneous recurrence. I will get back to the patient once we hear back from Dr. Vanderbilt.  Then we will arrange appointments with pharmacy education with Norleen. 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 - Considered Xeloda (capecitabine) for prevention of new lesions due to better tolerance than IV chemotherapy. Side  effects: occasional diarrhea, rash on hands/feet. - Arranged pharmacist education on Xeloda if treatment is chosen.  Slow wound healing Wound improving with granulation tissue; recent biopsy near wound confirmed cancer.  Weight loss Weight loss of ten pounds; increasing intake with protein bars. - Encouraged nutritional support with protein bars, monitored weight changes.      I discussed the assessment and treatment plan with the patient. The patient was provided an opportunity to ask questions and all were answered. The patient agreed with the plan and demonstrated an understanding of the instructions. The patient was advised to call back or seek an in-person evaluation if the symptoms worsen or if the condition fails to improve as anticipated.   I provided 20 minutes of non-face-to-face time during this encounter.  This includes time for charting and coordination of care   Naomi MARLA Chad, MD

## 2023-07-27 DIAGNOSIS — Z483 Aftercare following surgery for neoplasm: Secondary | ICD-10-CM | POA: Diagnosis not present

## 2023-07-27 DIAGNOSIS — Z96651 Presence of right artificial knee joint: Secondary | ICD-10-CM | POA: Diagnosis not present

## 2023-07-27 DIAGNOSIS — S21101D Unspecified open wound of right front wall of thorax without penetration into thoracic cavity, subsequent encounter: Secondary | ICD-10-CM | POA: Diagnosis not present

## 2023-07-27 DIAGNOSIS — I890 Lymphedema, not elsewhere classified: Secondary | ICD-10-CM | POA: Diagnosis not present

## 2023-07-27 DIAGNOSIS — Z9011 Acquired absence of right breast and nipple: Secondary | ICD-10-CM | POA: Diagnosis not present

## 2023-07-27 DIAGNOSIS — C7989 Secondary malignant neoplasm of other specified sites: Secondary | ICD-10-CM | POA: Diagnosis not present

## 2023-07-27 DIAGNOSIS — D630 Anemia in neoplastic disease: Secondary | ICD-10-CM | POA: Diagnosis not present

## 2023-07-27 DIAGNOSIS — Z85828 Personal history of other malignant neoplasm of skin: Secondary | ICD-10-CM | POA: Diagnosis not present

## 2023-07-27 DIAGNOSIS — Z9181 History of falling: Secondary | ICD-10-CM | POA: Diagnosis not present

## 2023-07-27 DIAGNOSIS — E7800 Pure hypercholesterolemia, unspecified: Secondary | ICD-10-CM | POA: Diagnosis not present

## 2023-07-27 DIAGNOSIS — H539 Unspecified visual disturbance: Secondary | ICD-10-CM | POA: Diagnosis not present

## 2023-07-27 DIAGNOSIS — M1712 Unilateral primary osteoarthritis, left knee: Secondary | ICD-10-CM | POA: Diagnosis not present

## 2023-07-27 DIAGNOSIS — B351 Tinea unguium: Secondary | ICD-10-CM | POA: Diagnosis not present

## 2023-07-27 DIAGNOSIS — M7061 Trochanteric bursitis, right hip: Secondary | ICD-10-CM | POA: Diagnosis not present

## 2023-07-27 DIAGNOSIS — C50311 Malignant neoplasm of lower-inner quadrant of right female breast: Secondary | ICD-10-CM | POA: Diagnosis not present

## 2023-07-27 DIAGNOSIS — L84 Corns and callosities: Secondary | ICD-10-CM | POA: Diagnosis not present

## 2023-07-27 DIAGNOSIS — I10 Essential (primary) hypertension: Secondary | ICD-10-CM | POA: Diagnosis not present

## 2023-08-01 DIAGNOSIS — Z9181 History of falling: Secondary | ICD-10-CM | POA: Diagnosis not present

## 2023-08-01 DIAGNOSIS — I10 Essential (primary) hypertension: Secondary | ICD-10-CM | POA: Diagnosis not present

## 2023-08-01 DIAGNOSIS — H539 Unspecified visual disturbance: Secondary | ICD-10-CM | POA: Diagnosis not present

## 2023-08-01 DIAGNOSIS — Z85828 Personal history of other malignant neoplasm of skin: Secondary | ICD-10-CM | POA: Diagnosis not present

## 2023-08-01 DIAGNOSIS — I890 Lymphedema, not elsewhere classified: Secondary | ICD-10-CM | POA: Diagnosis not present

## 2023-08-01 DIAGNOSIS — D630 Anemia in neoplastic disease: Secondary | ICD-10-CM | POA: Diagnosis not present

## 2023-08-01 DIAGNOSIS — E7800 Pure hypercholesterolemia, unspecified: Secondary | ICD-10-CM | POA: Diagnosis not present

## 2023-08-01 DIAGNOSIS — Z483 Aftercare following surgery for neoplasm: Secondary | ICD-10-CM | POA: Diagnosis not present

## 2023-08-01 DIAGNOSIS — Z9011 Acquired absence of right breast and nipple: Secondary | ICD-10-CM | POA: Diagnosis not present

## 2023-08-01 DIAGNOSIS — M1712 Unilateral primary osteoarthritis, left knee: Secondary | ICD-10-CM | POA: Diagnosis not present

## 2023-08-01 DIAGNOSIS — Z96651 Presence of right artificial knee joint: Secondary | ICD-10-CM | POA: Diagnosis not present

## 2023-08-01 DIAGNOSIS — C50311 Malignant neoplasm of lower-inner quadrant of right female breast: Secondary | ICD-10-CM | POA: Diagnosis not present

## 2023-08-01 DIAGNOSIS — M7061 Trochanteric bursitis, right hip: Secondary | ICD-10-CM | POA: Diagnosis not present

## 2023-08-01 DIAGNOSIS — S21101D Unspecified open wound of right front wall of thorax without penetration into thoracic cavity, subsequent encounter: Secondary | ICD-10-CM | POA: Diagnosis not present

## 2023-08-01 DIAGNOSIS — B351 Tinea unguium: Secondary | ICD-10-CM | POA: Diagnosis not present

## 2023-08-01 DIAGNOSIS — L84 Corns and callosities: Secondary | ICD-10-CM | POA: Diagnosis not present

## 2023-08-01 DIAGNOSIS — C7989 Secondary malignant neoplasm of other specified sites: Secondary | ICD-10-CM | POA: Diagnosis not present

## 2023-08-02 ENCOUNTER — Other Ambulatory Visit: Payer: Self-pay | Admitting: Hematology and Oncology

## 2023-08-02 MED ORDER — CAPECITABINE 500 MG PO TABS
1000.0000 mg | ORAL_TABLET | Freq: Two times a day (BID) | ORAL | 6 refills | Status: DC
  Filled 2023-08-04: qty 56, 21d supply, fill #0
  Filled 2023-08-22: qty 56, 21d supply, fill #1
  Filled 2023-09-11: qty 56, 21d supply, fill #2

## 2023-08-02 MED ORDER — CAPECITABINE 500 MG PO TABS: 1000.0000 mg | ORAL_TABLET | Freq: Two times a day (BID) | ORAL | 6 refills | Status: DC

## 2023-08-02 NOTE — Progress Notes (Signed)
 Surgery will be postponed for now. I discussed the patient about starting capecitabine . Follow-up in 2 weeks with Norleen to discuss tolerance to capecitabine 

## 2023-08-03 ENCOUNTER — Telehealth: Payer: Self-pay

## 2023-08-03 ENCOUNTER — Other Ambulatory Visit (HOSPITAL_COMMUNITY): Payer: Self-pay

## 2023-08-03 DIAGNOSIS — C7989 Secondary malignant neoplasm of other specified sites: Secondary | ICD-10-CM | POA: Diagnosis not present

## 2023-08-03 DIAGNOSIS — S21101D Unspecified open wound of right front wall of thorax without penetration into thoracic cavity, subsequent encounter: Secondary | ICD-10-CM | POA: Diagnosis not present

## 2023-08-03 DIAGNOSIS — Z9181 History of falling: Secondary | ICD-10-CM | POA: Diagnosis not present

## 2023-08-03 DIAGNOSIS — M7061 Trochanteric bursitis, right hip: Secondary | ICD-10-CM | POA: Diagnosis not present

## 2023-08-03 DIAGNOSIS — Z483 Aftercare following surgery for neoplasm: Secondary | ICD-10-CM | POA: Diagnosis not present

## 2023-08-03 DIAGNOSIS — C50311 Malignant neoplasm of lower-inner quadrant of right female breast: Secondary | ICD-10-CM | POA: Diagnosis not present

## 2023-08-03 DIAGNOSIS — Z96651 Presence of right artificial knee joint: Secondary | ICD-10-CM | POA: Diagnosis not present

## 2023-08-03 DIAGNOSIS — Z85828 Personal history of other malignant neoplasm of skin: Secondary | ICD-10-CM | POA: Diagnosis not present

## 2023-08-03 DIAGNOSIS — D630 Anemia in neoplastic disease: Secondary | ICD-10-CM | POA: Diagnosis not present

## 2023-08-03 DIAGNOSIS — Z9011 Acquired absence of right breast and nipple: Secondary | ICD-10-CM | POA: Diagnosis not present

## 2023-08-03 DIAGNOSIS — B351 Tinea unguium: Secondary | ICD-10-CM | POA: Diagnosis not present

## 2023-08-03 DIAGNOSIS — I10 Essential (primary) hypertension: Secondary | ICD-10-CM | POA: Diagnosis not present

## 2023-08-03 DIAGNOSIS — M1712 Unilateral primary osteoarthritis, left knee: Secondary | ICD-10-CM | POA: Diagnosis not present

## 2023-08-03 DIAGNOSIS — L84 Corns and callosities: Secondary | ICD-10-CM | POA: Diagnosis not present

## 2023-08-03 DIAGNOSIS — I890 Lymphedema, not elsewhere classified: Secondary | ICD-10-CM | POA: Diagnosis not present

## 2023-08-03 DIAGNOSIS — H539 Unspecified visual disturbance: Secondary | ICD-10-CM | POA: Diagnosis not present

## 2023-08-03 DIAGNOSIS — E7800 Pure hypercholesterolemia, unspecified: Secondary | ICD-10-CM | POA: Diagnosis not present

## 2023-08-03 NOTE — Telephone Encounter (Signed)
 Oral Oncology Pharmacist Encounter  Received new prescription for Xeloda  (capecitabine ) for the treatment of triple negative breast cancer, planned duration until disease progression or unacceptable toxicity.  Patient will have labs on 08/07/23 prior to visit with pharmacist, Norleen. Prescription dose and frequency assessed for appropriateness.   Current medication list in Epic reviewed, DDIs with Xeloda  identified: - antacids: may increase concentration of xeloda . No action needed. Severity is minor.   Evaluated chart and no patient barriers to medication adherence noted.   Prescription has been e-scribed to the [facility] for benefits analysis and approval.  Oral Oncology Clinic will continue to follow for insurance authorization, copayment issues, initial counseling and start date.  Deonna Krummel, PharmD Hematology/Oncology Clinical Pharmacist [facility] Oral Chemotherapy Navigation Clinic [phone] 08/03/2023 12:38 PM

## 2023-08-03 NOTE — Telephone Encounter (Signed)
 Oral Oncology Patient Advocate Encounter  After completing a benefits investigation, prior authorization for Capecitabine  is not required at this time through [facility].  Patient's copay is $43.81.      Charlott Hamilton,  CPhT-Adv  she/her/hers [facility]  [facility] Pharmacy Technician Patient Advocate Specialist III [facility] Phone: [phone]  Fax: [phone] [email or web address]

## 2023-08-04 ENCOUNTER — Other Ambulatory Visit (HOSPITAL_COMMUNITY): Payer: Self-pay

## 2023-08-04 ENCOUNTER — Other Ambulatory Visit: Payer: Self-pay

## 2023-08-04 ENCOUNTER — Other Ambulatory Visit: Payer: Self-pay | Admitting: *Deleted

## 2023-08-04 DIAGNOSIS — C50911 Malignant neoplasm of unspecified site of right female breast: Secondary | ICD-10-CM

## 2023-08-04 NOTE — Progress Notes (Signed)
 Specialty Pharmacy Initial Fill Coordination Note  Shawna [name] is a 83 y.o. female contacted today regarding refills of specialty medication(s) Capecitabine  (XELODA ) .  Patient requested [name] at [facility] at [place]  on 08/07/23   Medication will be filled on 08/04/23.   Patient is aware of $43.81 copayment.

## 2023-08-07 ENCOUNTER — Inpatient Hospital Stay

## 2023-08-07 ENCOUNTER — Inpatient Hospital Stay: Admitting: Pharmacist

## 2023-08-07 VITALS — BP 147/69 | HR 88 | Temp 97.4°F | Resp 18 | Ht 68.0 in | Wt 139.8 lb

## 2023-08-07 DIAGNOSIS — C50911 Malignant neoplasm of unspecified site of right female breast: Secondary | ICD-10-CM

## 2023-08-07 DIAGNOSIS — Z853 Personal history of malignant neoplasm of breast: Secondary | ICD-10-CM | POA: Diagnosis not present

## 2023-08-07 LAB — CBC WITH DIFFERENTIAL (CANCER CENTER ONLY)
Abs Immature Granulocytes: 0.01 K/uL (ref 0.00–0.07)
Basophils Absolute: 0.1 K/uL (ref 0.0–0.1)
Basophils Relative: 1 %
Eosinophils Absolute: 0.1 K/uL (ref 0.0–0.5)
Eosinophils Relative: 1 %
HCT: 34 % — ABNORMAL LOW (ref 36.0–46.0)
Hemoglobin: 11 g/dL — ABNORMAL LOW (ref 12.0–15.0)
Immature Granulocytes: 0 %
Lymphocytes Relative: 10 %
Lymphs Abs: 0.6 K/uL — ABNORMAL LOW (ref 0.7–4.0)
MCH: 29.7 pg (ref 26.0–34.0)
MCHC: 32.4 g/dL (ref 30.0–36.0)
MCV: 91.9 fL (ref 80.0–100.0)
Monocytes Absolute: 0.3 K/uL (ref 0.1–1.0)
Monocytes Relative: 5 %
Neutro Abs: 5.2 K/uL (ref 1.7–7.7)
Neutrophils Relative %: 83 %
Platelet Count: 331 K/uL (ref 150–400)
RBC: 3.7 MIL/uL — ABNORMAL LOW (ref 3.87–5.11)
RDW: 14.2 % (ref 11.5–15.5)
WBC Count: 6.3 K/uL (ref 4.0–10.5)
nRBC: 0 % (ref 0.0–0.2)

## 2023-08-07 LAB — CMP (CANCER CENTER ONLY)
ALT: 17 U/L (ref 0–44)
AST: 22 U/L (ref 15–41)
Albumin: 3.5 g/dL (ref 3.5–5.0)
Alkaline Phosphatase: 122 U/L (ref 38–126)
Anion gap: 10 (ref 5–15)
BUN: 26 mg/dL — ABNORMAL HIGH (ref 8–23)
CO2: 26 mmol/L (ref 22–32)
Calcium: 9.3 mg/dL (ref 8.9–10.3)
Chloride: 101 mmol/L (ref 98–111)
Creatinine: 1.14 mg/dL — ABNORMAL HIGH (ref 0.44–1.00)
GFR, Estimated: 48 mL/min — ABNORMAL LOW (ref >60)
Glucose, Bld: 96 mg/dL (ref 70–99)
Potassium: 4 mmol/L (ref 3.5–5.1)
Sodium: 137 mmol/L (ref 135–145)
Total Bilirubin: 0.6 mg/dL (ref 0.0–1.2)
Total Protein: 7.1 g/dL (ref 6.5–8.1)

## 2023-08-07 NOTE — Progress Notes (Signed)
 Patient counseled in clinic in-person visit note on 08/07/23

## 2023-08-07 NOTE — Progress Notes (Signed)
 [facility]       Telephone: [phone]?Fax: [phone]   Oncology Clinical Pharmacist Practitioner Initial Assessment  Shawna [name] is a 83 y.o. female with a diagnosis of breast cancer. They were contacted today via in-person visit. She is accompanied by her son Shawna [name].  Indication/Regimen Capecitabine  (Xeloda ) is being used appropriately for treatment of metastatic breast cancer by Dr. Vinay Gudena.      Wt Readings from Last 1 Encounters:  08/07/23 139 lb 12.8 oz (63.4 kg)    Estimated body surface area is 1.74 meters squared as calculated from the following:   Height as of this encounter: 5' 8 (1.727 m).   Weight as of this encounter: 139 lb 12.8 oz (63.4 kg).  The dosing regimen is 2 tablets (1000 mg) by mouth in the morning and 2 tablets (1000 mg) in the evening on days 1 to 14 of a 21-day cycle. . This is being given  monotherapy. It is planned to continue until disease progression or unacceptable toxicity. Prescription dose and frequency assessed for appropriateness.  Patient has agreed to treatment which is documented in physician note on 07/26/23. Counseled patient on administration, dosing, side effects, monitoring, drug-food interactions, safe handling, storage, and disposal.  Baseline labs being done today.She will pick up capecitabine  today from [facility] and start tomorrow. She will see Dr. Gudena with labs in 3 weeks prior to the start of her next cycle of capecitabine   Dose Modifications Dr. Odean is starting patient at 1000 mg PO BID 14 out of 21 days (~556 mg/m2 per dose)   Access Assessment Shawna [name] will be receiving capecitabine  through [facility] Concerns: None Start date if known: 08/08/23  Adherence Assessment Reviewed importance on keeping a med schedule and plan for any missed doses Barriers to adherence identified? No  Allergies No Known Allergies  Vitals    08/07/2023    1:04 PM  07/03/2023   11:11 AM 06/08/2023   11:20 AM  Oncology Vitals  Height 173 cm  173 cm  Weight 63.413 kg  67.677 kg  Weight (lbs) 139 lbs 13 oz  149 lbs 3 oz  BMI 21.26 kg/m2  22.69 kg/m2  Temp 97.4 F (36.3 C)  98.2 F (36.8 C)  Pulse Rate 88 97 75  BP 147/69 124/76 155/82  Resp 18  17  SpO2 100 % 99 % 99 %  BSA (m2) 1.74 m2  1.8 m2     Laboratory Data    Latest Ref Rng & Units 08/07/2023   12:46 PM 05/05/2021    3:46 AM 04/22/2021   11:22 AM  CBC EXTENDED  WBC 4.0 - 10.5 K/uL 6.3  8.2  4.5   RBC 3.87 - 5.11 MIL/uL 3.70  3.39  4.24   Hemoglobin 12.0 - 15.0 g/dL 88.9  88.7  86.2   HCT 36.0 - 46.0 % 34.0  32.8  40.9   Platelets 150 - 400 K/uL 331  165  223   NEUT# 1.7 - 7.7 K/uL 5.2     Lymph# 0.7 - 4.0 K/uL 0.6          Latest Ref Rng & Units 08/07/2023   12:46 PM 05/17/2023   11:30 AM 05/05/2021    3:46 AM  CMP  Glucose 70 - 99 mg/dL 96  83  861   BUN 8 - 23 mg/dL 26  26  28    Creatinine 0.44 - 1.00 mg/dL 8.85  8.74  8.86  Sodium 135 - 145 mmol/L 137  139  135   Potassium 3.5 - 5.1 mmol/L 4.0  4.0  3.8   Chloride 98 - 111 mmol/L 101  107  106   CO2 22 - 32 mmol/L 26  25  22    Calcium  8.9 - 10.3 mg/dL 9.3  9.1  8.1   Total Protein 6.5 - 8.1 g/dL 7.1     Total Bilirubin 0.0 - 1.2 mg/dL 0.6     Alkaline Phos 38 - 126 U/L 122     AST 15 - 41 U/L 22     ALT 0 - 44 U/L 17       Contraindications Contraindications were reviewed? Yes Contraindications to therapy were identified? No   Safety Precautions The following safety precautions for the use of capecitabine  were reviewed:  Fever: reviewed the importance of having a thermometer and the Centers for Disease Control and Prevention (CDC) definition of fever which is 100.29F (38C) or higher. Patient should call 24/7 triage at [phone] if experiencing a fever or any other symptoms Decreased white blood cells (WBCs) and increased risk for infection Decreased hemoglobin, part of the red blood cells that carry iron and  oxygen Diarrhea Mouth irritation or sores Pain or discomfort in hands and/or feet Changes in liver function Nausea or vomiting Fatigue Abdominal pain Decreased platelet count and increased risk of bleeding Interactions with warfarin Cardiotoxicity Kidney injury Dehydration Alopecia Handling body fluids and waste Pregnancy, sexual activity, and contraception Storage and Handling To be taken within 30 minutes of a meal Missed doses  Medication Reconciliation Current Outpatient Medications  Medication Sig Dispense Refill   acetaminophen  (TYLENOL ) 650 MG CR tablet Take 650 mg by mouth every 8 (eight) hours as needed for pain.     Ascorbic Acid (VITAMIN C PO) Take by mouth.     Calcium  500-2.5 MG-MCG CHEW      Cholecalciferol (VITAMIN D) 10 MCG/ML LIQD      CRESTOR  20 MG tablet Take 20 mg by mouth in the morning.  0   hydrochlorothiazide  (HYDRODIURIL ) 12.5 MG tablet Take 12.5 mg by mouth in the morning.     losartan  (COZAAR ) 100 MG tablet Take 100 mg by mouth in the morning.  0   Multiple Vitamin (MULTIVITAMIN) tablet Take 1 tablet by mouth daily.     Polyethyl Glyc-Propyl Glyc PF (SYSTANE PRESERVATIVE FREE) 0.4-0.3 % SOLN Place 1-2 drops into both eyes 3 (three) times daily as needed (dry/irritated eyes).     REPLENS VAGINAL MOISTURIZER VA Place vaginally.     Vibegron (GEMTESA) 75 MG TABS 1 tablet Orally Once a day     zinc gluconate 50 MG tablet Take 50 mg by mouth daily.     capecitabine  (XELODA ) 500 MG tablet Take 2 tablets (1,000 mg total) by mouth 2 (two) times daily after a meal. 14 days on and 7 days off (Patient not taking: Reported on 08/07/2023) 56 tablet 6   No current facility-administered medications for this visit.   Medication reconciliation is based on the patient's most recent medication list in the electronic medical record (EMR) including herbal products and OTC medications.   The patient's medication list was reviewed today with the patient? Yes   Drug-drug  interactions (DDIs) DDIs were evaluated? Yes Significant DDIs identified? No   Drug-Food Interactions Drug-food interactions were evaluated? Yes Drug-food interactions identified? No   Follow-up Plan  Patient education handout given to patient Start capecitabine  2 tablets (1000 mg) by mouth in  the morning and 2 tablets (1000 mg) in the evening on days 1 to 14 of a 21-day cycle. Take within 30 minutes of a meal. Monitor for toxicities Will add labs, Dr. Odean visit in 3 weeks prior to start of next cycle Shawna [name] can follow up with clinical pharmacy as deemed necessary by Dr. Mackey Odean going forward   Shawna [name] participated in the discussion, expressed understanding, and voiced agreement with the above plan. All questions were answered to her satisfaction. The patient was advised to contact the clinic at [phone] with any questions or concerns prior to her return visit.   I spent 60 minutes assessing the patient.  Delaney Perona A. [name], PharmD, BCOP, CPP  Shawna [name], RPH-CPP, 08/07/2023 1:42 PM  **Disclaimer: This note was dictated with voice recognition software. Similar sounding words can inadvertently be transcribed and this note may contain transcription errors which may not have been corrected upon publication of note.**

## 2023-08-08 DIAGNOSIS — Z9011 Acquired absence of right breast and nipple: Secondary | ICD-10-CM | POA: Diagnosis not present

## 2023-08-08 DIAGNOSIS — I10 Essential (primary) hypertension: Secondary | ICD-10-CM | POA: Diagnosis not present

## 2023-08-08 DIAGNOSIS — C50311 Malignant neoplasm of lower-inner quadrant of right female breast: Secondary | ICD-10-CM | POA: Diagnosis not present

## 2023-08-08 DIAGNOSIS — H539 Unspecified visual disturbance: Secondary | ICD-10-CM | POA: Diagnosis not present

## 2023-08-08 DIAGNOSIS — D630 Anemia in neoplastic disease: Secondary | ICD-10-CM | POA: Diagnosis not present

## 2023-08-08 DIAGNOSIS — M7061 Trochanteric bursitis, right hip: Secondary | ICD-10-CM | POA: Diagnosis not present

## 2023-08-08 DIAGNOSIS — S21101D Unspecified open wound of right front wall of thorax without penetration into thoracic cavity, subsequent encounter: Secondary | ICD-10-CM | POA: Diagnosis not present

## 2023-08-08 DIAGNOSIS — C7989 Secondary malignant neoplasm of other specified sites: Secondary | ICD-10-CM | POA: Diagnosis not present

## 2023-08-08 DIAGNOSIS — B351 Tinea unguium: Secondary | ICD-10-CM | POA: Diagnosis not present

## 2023-08-08 DIAGNOSIS — M1712 Unilateral primary osteoarthritis, left knee: Secondary | ICD-10-CM | POA: Diagnosis not present

## 2023-08-08 DIAGNOSIS — Z9181 History of falling: Secondary | ICD-10-CM | POA: Diagnosis not present

## 2023-08-08 DIAGNOSIS — I890 Lymphedema, not elsewhere classified: Secondary | ICD-10-CM | POA: Diagnosis not present

## 2023-08-08 DIAGNOSIS — L84 Corns and callosities: Secondary | ICD-10-CM | POA: Diagnosis not present

## 2023-08-08 DIAGNOSIS — E7800 Pure hypercholesterolemia, unspecified: Secondary | ICD-10-CM | POA: Diagnosis not present

## 2023-08-08 DIAGNOSIS — Z85828 Personal history of other malignant neoplasm of skin: Secondary | ICD-10-CM | POA: Diagnosis not present

## 2023-08-08 DIAGNOSIS — Z96651 Presence of right artificial knee joint: Secondary | ICD-10-CM | POA: Diagnosis not present

## 2023-08-08 DIAGNOSIS — Z483 Aftercare following surgery for neoplasm: Secondary | ICD-10-CM | POA: Diagnosis not present

## 2023-08-14 DIAGNOSIS — K080 Exfoliation of teeth due to systemic causes: Secondary | ICD-10-CM | POA: Diagnosis not present

## 2023-08-17 DIAGNOSIS — I10 Essential (primary) hypertension: Secondary | ICD-10-CM | POA: Diagnosis not present

## 2023-08-17 DIAGNOSIS — E7800 Pure hypercholesterolemia, unspecified: Secondary | ICD-10-CM | POA: Diagnosis not present

## 2023-08-17 DIAGNOSIS — Z483 Aftercare following surgery for neoplasm: Secondary | ICD-10-CM | POA: Diagnosis not present

## 2023-08-17 DIAGNOSIS — L84 Corns and callosities: Secondary | ICD-10-CM | POA: Diagnosis not present

## 2023-08-17 DIAGNOSIS — Z96651 Presence of right artificial knee joint: Secondary | ICD-10-CM | POA: Diagnosis not present

## 2023-08-17 DIAGNOSIS — B351 Tinea unguium: Secondary | ICD-10-CM | POA: Diagnosis not present

## 2023-08-17 DIAGNOSIS — S21101D Unspecified open wound of right front wall of thorax without penetration into thoracic cavity, subsequent encounter: Secondary | ICD-10-CM | POA: Diagnosis not present

## 2023-08-17 DIAGNOSIS — Z9011 Acquired absence of right breast and nipple: Secondary | ICD-10-CM | POA: Diagnosis not present

## 2023-08-17 DIAGNOSIS — M1712 Unilateral primary osteoarthritis, left knee: Secondary | ICD-10-CM | POA: Diagnosis not present

## 2023-08-17 DIAGNOSIS — C7989 Secondary malignant neoplasm of other specified sites: Secondary | ICD-10-CM | POA: Diagnosis not present

## 2023-08-17 DIAGNOSIS — D630 Anemia in neoplastic disease: Secondary | ICD-10-CM | POA: Diagnosis not present

## 2023-08-17 DIAGNOSIS — I890 Lymphedema, not elsewhere classified: Secondary | ICD-10-CM | POA: Diagnosis not present

## 2023-08-17 DIAGNOSIS — Z9181 History of falling: Secondary | ICD-10-CM | POA: Diagnosis not present

## 2023-08-17 DIAGNOSIS — C50311 Malignant neoplasm of lower-inner quadrant of right female breast: Secondary | ICD-10-CM | POA: Diagnosis not present

## 2023-08-17 DIAGNOSIS — H539 Unspecified visual disturbance: Secondary | ICD-10-CM | POA: Diagnosis not present

## 2023-08-17 DIAGNOSIS — M7061 Trochanteric bursitis, right hip: Secondary | ICD-10-CM | POA: Diagnosis not present

## 2023-08-17 DIAGNOSIS — Z85828 Personal history of other malignant neoplasm of skin: Secondary | ICD-10-CM | POA: Diagnosis not present

## 2023-08-18 DIAGNOSIS — S21101A Unspecified open wound of right front wall of thorax without penetration into thoracic cavity, initial encounter: Secondary | ICD-10-CM | POA: Diagnosis not present

## 2023-08-22 ENCOUNTER — Encounter (INDEPENDENT_AMBULATORY_CARE_PROVIDER_SITE_OTHER): Payer: Self-pay

## 2023-08-22 ENCOUNTER — Other Ambulatory Visit: Payer: Self-pay

## 2023-08-22 ENCOUNTER — Other Ambulatory Visit: Payer: Self-pay | Admitting: Pharmacy Technician

## 2023-08-22 NOTE — Progress Notes (Signed)
 Specialty Pharmacy Refill Coordination Note  Shawna [name] is a 83 y.o. female contacted today regarding refills of specialty medication(s) Capecitabine  (XELODA )   Patient requested (Patient-Rptd) Pickup at [facility] date: (Patient-Rptd) 08/25/23   Medication will be filled on 08/24/23.

## 2023-08-23 ENCOUNTER — Other Ambulatory Visit: Payer: Self-pay

## 2023-08-23 DIAGNOSIS — Z96651 Presence of right artificial knee joint: Secondary | ICD-10-CM | POA: Diagnosis not present

## 2023-08-23 DIAGNOSIS — C50311 Malignant neoplasm of lower-inner quadrant of right female breast: Secondary | ICD-10-CM | POA: Diagnosis not present

## 2023-08-23 DIAGNOSIS — Z85828 Personal history of other malignant neoplasm of skin: Secondary | ICD-10-CM | POA: Diagnosis not present

## 2023-08-23 DIAGNOSIS — I10 Essential (primary) hypertension: Secondary | ICD-10-CM | POA: Diagnosis not present

## 2023-08-23 DIAGNOSIS — S21101D Unspecified open wound of right front wall of thorax without penetration into thoracic cavity, subsequent encounter: Secondary | ICD-10-CM | POA: Diagnosis not present

## 2023-08-23 DIAGNOSIS — M1712 Unilateral primary osteoarthritis, left knee: Secondary | ICD-10-CM | POA: Diagnosis not present

## 2023-08-23 DIAGNOSIS — L84 Corns and callosities: Secondary | ICD-10-CM | POA: Diagnosis not present

## 2023-08-23 DIAGNOSIS — Z9181 History of falling: Secondary | ICD-10-CM | POA: Diagnosis not present

## 2023-08-23 DIAGNOSIS — B351 Tinea unguium: Secondary | ICD-10-CM | POA: Diagnosis not present

## 2023-08-23 DIAGNOSIS — M7061 Trochanteric bursitis, right hip: Secondary | ICD-10-CM | POA: Diagnosis not present

## 2023-08-23 DIAGNOSIS — Z483 Aftercare following surgery for neoplasm: Secondary | ICD-10-CM | POA: Diagnosis not present

## 2023-08-23 DIAGNOSIS — Z9011 Acquired absence of right breast and nipple: Secondary | ICD-10-CM | POA: Diagnosis not present

## 2023-08-23 DIAGNOSIS — H539 Unspecified visual disturbance: Secondary | ICD-10-CM | POA: Diagnosis not present

## 2023-08-23 DIAGNOSIS — D630 Anemia in neoplastic disease: Secondary | ICD-10-CM | POA: Diagnosis not present

## 2023-08-23 DIAGNOSIS — E7800 Pure hypercholesterolemia, unspecified: Secondary | ICD-10-CM | POA: Diagnosis not present

## 2023-08-23 DIAGNOSIS — I890 Lymphedema, not elsewhere classified: Secondary | ICD-10-CM | POA: Diagnosis not present

## 2023-08-23 DIAGNOSIS — C7989 Secondary malignant neoplasm of other specified sites: Secondary | ICD-10-CM | POA: Diagnosis not present

## 2023-08-28 ENCOUNTER — Other Ambulatory Visit: Payer: Self-pay

## 2023-08-28 DIAGNOSIS — L7682 Other postprocedural complications of skin and subcutaneous tissue: Secondary | ICD-10-CM | POA: Diagnosis not present

## 2023-08-28 NOTE — Progress Notes (Signed)
 Specialty Pharmacy Ongoing Clinical Assessment Note  Shawna [name] is a 83 y.o. female who is being followed by the specialty pharmacy service for RxSp Oncology   Patient's specialty medication(s) reviewed today: Capecitabine  (XELODA )   Missed doses in the last 4 weeks: 0   Patient/Caregiver did not have any additional questions or concerns.   Therapeutic benefit summary: Unable to assess   Adverse events/side effects summary: Experienced adverse events/side effects (Some fatigue during treatment weeks, felt better on week off, and loose bowels, but both very tolerable at this time.)   Patient's therapy is appropriate to: Continue    Goals Addressed             This Visit's Progress    Maintain optimal adherence to therapy   On track    Patient is on track. Patient will maintain adherence         Follow up: 3 months  [facility] Specialty Pharmacist

## 2023-08-29 DIAGNOSIS — D630 Anemia in neoplastic disease: Secondary | ICD-10-CM | POA: Diagnosis not present

## 2023-08-29 DIAGNOSIS — M1712 Unilateral primary osteoarthritis, left knee: Secondary | ICD-10-CM | POA: Diagnosis not present

## 2023-08-29 DIAGNOSIS — C7989 Secondary malignant neoplasm of other specified sites: Secondary | ICD-10-CM | POA: Diagnosis not present

## 2023-08-29 DIAGNOSIS — B351 Tinea unguium: Secondary | ICD-10-CM | POA: Diagnosis not present

## 2023-08-29 DIAGNOSIS — Z85828 Personal history of other malignant neoplasm of skin: Secondary | ICD-10-CM | POA: Diagnosis not present

## 2023-08-29 DIAGNOSIS — I10 Essential (primary) hypertension: Secondary | ICD-10-CM | POA: Diagnosis not present

## 2023-08-29 DIAGNOSIS — M7061 Trochanteric bursitis, right hip: Secondary | ICD-10-CM | POA: Diagnosis not present

## 2023-08-29 DIAGNOSIS — S21101D Unspecified open wound of right front wall of thorax without penetration into thoracic cavity, subsequent encounter: Secondary | ICD-10-CM | POA: Diagnosis not present

## 2023-08-29 DIAGNOSIS — L84 Corns and callosities: Secondary | ICD-10-CM | POA: Diagnosis not present

## 2023-08-29 DIAGNOSIS — Z9011 Acquired absence of right breast and nipple: Secondary | ICD-10-CM | POA: Diagnosis not present

## 2023-08-29 DIAGNOSIS — E7800 Pure hypercholesterolemia, unspecified: Secondary | ICD-10-CM | POA: Diagnosis not present

## 2023-08-29 DIAGNOSIS — Z483 Aftercare following surgery for neoplasm: Secondary | ICD-10-CM | POA: Diagnosis not present

## 2023-08-29 DIAGNOSIS — I890 Lymphedema, not elsewhere classified: Secondary | ICD-10-CM | POA: Diagnosis not present

## 2023-08-29 DIAGNOSIS — Z96651 Presence of right artificial knee joint: Secondary | ICD-10-CM | POA: Diagnosis not present

## 2023-08-29 DIAGNOSIS — Z9181 History of falling: Secondary | ICD-10-CM | POA: Diagnosis not present

## 2023-08-29 DIAGNOSIS — H539 Unspecified visual disturbance: Secondary | ICD-10-CM | POA: Diagnosis not present

## 2023-08-29 DIAGNOSIS — C50311 Malignant neoplasm of lower-inner quadrant of right female breast: Secondary | ICD-10-CM | POA: Diagnosis not present

## 2023-08-30 NOTE — Assessment & Plan Note (Signed)
 Right lumpectomy 02/03/2015: Invasive ductal carcinoma grade 2, 2.1 cm, with associated DCIS, margins are negative, 0/1 lymph node, ER 0%, PR 0%, HER-2 positive ratio 2.31, T2 N0 stage II a. (Right breast biopsy 3:30 position 12/09/2014: Invasive ductal carcinoma grade 3, ER 0%, PR 0%, HER-2 negative ratio 1.5, Ki-67 90%, 1 cm irregular mass T1C N0 stage IA clinical stage) Genetic counseling revealed NBN mutation   2. Adjuvant chemotherapy with TCH 6 cycles started 03/09/15 completed 06/22/15 took Herceptin  maintenance for 1 year until 02/15/2016 3. Followed by radiation therapy 08/11/2015 to 09/22/2015 4. Cutaneous recurrence: 03/07/2017: Right mastectomy: IDC grade 3, 2 foci spanning 1.2 cm and 1.1 cm, lymphovascular invasion is present including dermal lymphatics, margins negative, 0/2 lymph nodes negative, ER 0%, PR 0%, HER-2 negative, Ki-67 not done,pT4b (skin involvement) N0 stage IIIc 5. Adjuvant radiation therapy 11/14/2017-12/29/2017 ----------------------------------------------------------------------------------------------- Patient decided not to receive any further systemic chemotherapy. There is no role of antiestrogen therapy because she is triple negative.   Second Chest wall Recurrence: 04/10/2023: [facility] dermatology Dr. 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  No evidence of distant metastases   05/23/2023: Soft tissue chest wall excision: Grade 3 IDC with skin involvement, margins negative, ER 0%, PR 0%, Ki67 70%, HER2 0 negative [facility] molecular testing: Triple negative, HER2 0, TMB 4, PD-L1 negative, no actionable mutations Weight Loss: 8-9 lbs   Cutaneous recurrence: 07/17/23: Skin biopsy: Poorly Diff carcinoma ER: 5%, PR 0%, Ki 67: 80%, HER 2: 0 I discussed with her about different options including antiestrogen therapy (ER 5% unlikely  to benefit) versus capecitabine .  I discussed risks and benefits of capecitabine  and she is willing to consider taking it.   I will discuss with Dr. Vanderbilt if surgery is an option for the cutaneous recurrence. I will get back to the patient once we hear back from Dr. Vanderbilt.  Then we will arrange appointments with pharmacy education with Norleen.

## 2023-08-31 ENCOUNTER — Inpatient Hospital Stay: Attending: Hematology and Oncology

## 2023-08-31 ENCOUNTER — Inpatient Hospital Stay: Admitting: Hematology and Oncology

## 2023-08-31 VITALS — BP 102/68 | HR 89 | Temp 98.4°F | Resp 16 | Ht 68.0 in | Wt 138.5 lb

## 2023-08-31 DIAGNOSIS — Z17421 Hormone receptor negative with human epidermal growth factor receptor 2 negative status: Secondary | ICD-10-CM | POA: Diagnosis not present

## 2023-08-31 DIAGNOSIS — D6481 Anemia due to antineoplastic chemotherapy: Secondary | ICD-10-CM | POA: Insufficient documentation

## 2023-08-31 DIAGNOSIS — T451X5A Adverse effect of antineoplastic and immunosuppressive drugs, initial encounter: Secondary | ICD-10-CM | POA: Diagnosis not present

## 2023-08-31 DIAGNOSIS — C50911 Malignant neoplasm of unspecified site of right female breast: Secondary | ICD-10-CM

## 2023-08-31 DIAGNOSIS — Z79899 Other long term (current) drug therapy: Secondary | ICD-10-CM | POA: Diagnosis not present

## 2023-08-31 DIAGNOSIS — Z171 Estrogen receptor negative status [ER-]: Secondary | ICD-10-CM | POA: Insufficient documentation

## 2023-08-31 DIAGNOSIS — Z1722 Progesterone receptor negative status: Secondary | ICD-10-CM | POA: Diagnosis not present

## 2023-08-31 DIAGNOSIS — C50311 Malignant neoplasm of lower-inner quadrant of right female breast: Secondary | ICD-10-CM | POA: Diagnosis not present

## 2023-08-31 LAB — CBC WITH DIFFERENTIAL (CANCER CENTER ONLY)
Abs Immature Granulocytes: 0.01 K/uL (ref 0.00–0.07)
Basophils Absolute: 0 K/uL (ref 0.0–0.1)
Basophils Relative: 1 %
Eosinophils Absolute: 0 K/uL (ref 0.0–0.5)
Eosinophils Relative: 1 %
HCT: 31.9 % — ABNORMAL LOW (ref 36.0–46.0)
Hemoglobin: 10.4 g/dL — ABNORMAL LOW (ref 12.0–15.0)
Immature Granulocytes: 0 %
Lymphocytes Relative: 9 %
Lymphs Abs: 0.5 K/uL — ABNORMAL LOW (ref 0.7–4.0)
MCH: 30.6 pg (ref 26.0–34.0)
MCHC: 32.6 g/dL (ref 30.0–36.0)
MCV: 93.8 fL (ref 80.0–100.0)
Monocytes Absolute: 0.3 K/uL (ref 0.1–1.0)
Monocytes Relative: 5 %
Neutro Abs: 4.9 K/uL (ref 1.7–7.7)
Neutrophils Relative %: 84 %
Platelet Count: 284 K/uL (ref 150–400)
RBC: 3.4 MIL/uL — ABNORMAL LOW (ref 3.87–5.11)
RDW: 18 % — ABNORMAL HIGH (ref 11.5–15.5)
WBC Count: 5.8 K/uL (ref 4.0–10.5)
nRBC: 0 % (ref 0.0–0.2)

## 2023-08-31 LAB — CMP (CANCER CENTER ONLY)
ALT: 13 U/L (ref 0–44)
AST: 18 U/L (ref 15–41)
Albumin: 3.7 g/dL (ref 3.5–5.0)
Alkaline Phosphatase: 85 U/L (ref 38–126)
Anion gap: 8 (ref 5–15)
BUN: 23 mg/dL (ref 8–23)
CO2: 26 mmol/L (ref 22–32)
Calcium: 9 mg/dL (ref 8.9–10.3)
Chloride: 102 mmol/L (ref 98–111)
Creatinine: 1.16 mg/dL — ABNORMAL HIGH (ref 0.44–1.00)
GFR, Estimated: 47 mL/min — ABNORMAL LOW (ref >60)
Glucose, Bld: 111 mg/dL — ABNORMAL HIGH (ref 70–99)
Potassium: 3.7 mmol/L (ref 3.5–5.1)
Sodium: 136 mmol/L (ref 135–145)
Total Bilirubin: 0.7 mg/dL (ref 0.0–1.2)
Total Protein: 6.7 g/dL (ref 6.5–8.1)

## 2023-08-31 NOTE — Progress Notes (Signed)
 Patient Care Team: Onita Rush, MD as PCP - General (Internal Medicine) Ethyl Lenis, MD as Consulting Physician (General Surgery) Odean Potts, MD as Medical Oncologist (Hematology and Oncology) Crawford, Morna Pickle, NP as Nurse Practitioner (Hematology and Oncology) Jannis Kate Norris, MD as Consulting Physician (Obstetrics and Gynecology) Glean Stephane BROCKS, RN (Inactive) as Oncology Nurse Navigator Tyree Nanetta SAILOR, RN as Oncology Nurse Navigator Lucila Rush LABOR, RPH-CPP as Pharmacist (Hematology and Oncology)  DIAGNOSIS:  Encounter Diagnosis  Name Primary?   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 Genes tested include:  ATM, BARD1, BRCA1, BRCA2, BRIP1, CDH1, CHEK2, EPCAM, FANCC, MLH1, MSH2, MSH6, NBN, PALB2, PMS2, PTEN, RAD51C, RAD51D, TP53, and XRCC2.   02/03/2015 Surgery   Right lumpectomy: Invasive ductal carcinoma grade 2, 2.1 cm, with associated DCIS, margins are negative, 0/1 lymph node, ER 0%, PR 0%, HER-2 positive ratio 2.31, T2 N0 stage II a   03/09/2015 - 06/22/2015 Chemotherapy   Adjuvant chemotherapy with TCH 6 followed by Herceptin  maintenance for 1 year   08/11/2015 - 09/23/2015 Radiation Therapy   Adjuvant radiation therapy [facility]):  1) Right Breast / 50 Gy in 25 fractions ; 2) Right Breast Boost / 10 Gy in 5 fractions   01/30/2017 Relapse/Recurrence   Skin biopsy right breast: Metastatic breast cancer GCDFP strongly  positive   02/15/2017 PET scan   Hypermetabolic focus of skin thickening along the medial right breast/chest wall consistent with recurrent disease, no other hypermetabolic metastases identified.   03/07/2017 Surgery   Right mastectomy: IDC grade 3, 2 foci spanning 1.2 cm and 1.1 cm, lymphovascular invasion is present including dermal lymphatics, margins negative, 0/2 lymph nodes negative, ER 0%, PR 0%, HER-2 negative, Ki-67 not done,pT4b (skin involvement) N0 stage IIIc   10/12/2017 Surgery   Chest wall Excision: Breast cancer   11/14/2017 - 12/29/2017 Radiation Therapy   Adjuvant right chest wall XRT     CHIEF COMPLIANT:   HISTORY OF PRESENT ILLNESS: History of Present Illness Shawna [name] is an 83 year old female who presents for follow-up on chemotherapy treatment for a non-healing wound.  She is currently on Xeloda  (capecitabine ) for the non-healing wound, taking two pills in the morning and two at night, having just resumed after an off week. She experiences occasional short stabbing pain at the wound site but it is not painful most days. Wound management includes daily dressing changes with Dakin's solution, followed by two 4x4 gauze pads and an ABD pad. A home health care nurse visits twice a week for wound care due to infections. The bandaging is soaked when removed in the mornings.  She reports no significant side effects from the chemotherapy such as nausea or diarrhea, and notes a slight increase in energy levels. There is no rash on her hands or feet, though she does not use moisturizer as frequently as recommended. She is working on increasing her water intake.  Recent lab results show a decrease in hemoglobin from  11.2 to 10. Minimal bleeding occurs when changing dressings. Her platelets are normal, and her kidney and liver function tests are stable.     ALLERGIES:  has no known allergies.  MEDICATIONS:  Current Outpatient Medications  Medication Sig Dispense Refill    acetaminophen  (TYLENOL ) 650 MG CR tablet Take 650 mg by mouth every 8 (eight) hours as needed for pain.     Ascorbic Acid (VITAMIN C PO) Take by mouth.     Calcium  500-2.5 MG-MCG CHEW      capecitabine  (XELODA ) 500 MG tablet Take 2 tablets (1,000 mg total) by mouth 2 (two) times daily after a meal. 14 days on and 7 days off 56 tablet 6   ciprofloxacin (CIPRO) 500 MG tablet Take 500 mg by mouth.     CRESTOR  20 MG tablet Take 20 mg by mouth in the morning.  0   doxycycline (MONODOX) 100 MG capsule Take 100 mg by mouth.     hydrochlorothiazide  (HYDRODIURIL ) 12.5 MG tablet Take 12.5 mg by mouth in the morning.     losartan  (COZAAR ) 100 MG tablet Take 100 mg by mouth in the morning.  0   Multiple Vitamin (MULTIVITAMIN) tablet Take 1 tablet by mouth daily.     Polyethyl Glyc-Propyl Glyc PF (SYSTANE PRESERVATIVE FREE) 0.4-0.3 % SOLN Place 1-2 drops into both eyes 3 (three) times daily as needed (dry/irritated eyes).     Vibegron (GEMTESA) 75 MG TABS 1 tablet Orally Once a day     zinc gluconate 50 MG tablet Take 50 mg by mouth daily. (Patient not taking: Reported on 08/31/2023)     No current facility-administered medications for this visit.    PHYSICAL EXAMINATION: ECOG PERFORMANCE STATUS: 1 - Symptomatic but completely ambulatory  Vitals:   08/31/23 1250  BP: 102/68  Pulse: 89  Resp: 16  Temp: 98.4 F (36.9 C)  SpO2: 98%   Filed Weights   08/31/23 1250  Weight: 138 lb 8 oz (62.8 kg)    Physical Exam   (exam performed in the presence of a chaperone)  LABORATORY DATA:  I have reviewed the data as listed    Latest Ref Rng & Units 08/31/2023   12:14 PM 08/07/2023   12:46 PM 05/17/2023   11:30 AM  CMP  Glucose 70 - 99 mg/dL 888  96  83   BUN 8 - 23 mg/dL 23  26  26    Creatinine 0.44 - 1.00 mg/dL 8.83  8.85  8.74   Sodium 135 - 145 mmol/L 136  137  139   Potassium 3.5 - 5.1 mmol/L 3.7  4.0  4.0   Chloride 98 - 111 mmol/L 102  101  107   CO2 22 - 32 mmol/L 26  26  25    Calcium   8.9 - 10.3 mg/dL 9.0  9.3  9.1   Total Protein 6.5 - 8.1 g/dL 6.7  7.1    Total Bilirubin 0.0 - 1.2 mg/dL 0.7  0.6    Alkaline Phos 38 - 126 U/L 85  122    AST 15 - 41 U/L 18  22    ALT 0 - 44 U/L 13  17      Lab Results  Component Value Date   WBC 5.8 08/31/2023   HGB 10.4 (L) 08/31/2023   HCT 31.9 (L) 08/31/2023   MCV 93.8 08/31/2023   PLT 284 08/31/2023   NEUTROABS 4.9 08/31/2023    ASSESSMENT & PLAN:  Breast cancer of lower-inner quadrant of right  female breast [facility]) Right lumpectomy 02/03/2015: Invasive ductal carcinoma grade 2, 2.1 cm, with associated DCIS, margins are negative, 0/1 lymph node, ER 0%, PR 0%, HER-2 positive ratio 2.31, T2 N0 stage II a. (Right breast biopsy 3:30 position 12/09/2014: Invasive ductal carcinoma grade 3, ER 0%, PR 0%, HER-2 negative ratio 1.5, Ki-67 90%, 1 cm irregular mass T1C N0 stage IA clinical stage) Genetic counseling revealed NBN mutation   2. Adjuvant chemotherapy with TCH 6 cycles started 03/09/15 completed 06/22/15 took Herceptin  maintenance for 1 year until 02/15/2016 3. Followed by radiation therapy 08/11/2015 to 09/22/2015 4. Cutaneous recurrence: 03/07/2017: Right mastectomy: IDC grade 3, 2 foci spanning 1.2 cm and 1.1 cm, lymphovascular invasion is present including dermal lymphatics, margins negative, 0/2 lymph nodes negative, ER 0%, PR 0%, HER-2 negative, Ki-67 not done,pT4b (skin involvement) N0 stage IIIc 5. Adjuvant radiation therapy 11/14/2017-12/29/2017 ----------------------------------------------------------------------------------------------- Patient decided not to receive any further systemic chemotherapy. There is no role of antiestrogen therapy because she is triple negative.   Second Chest wall Recurrence: 04/10/2023: [facility] dermatology Dr. 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   No evidence of distant metastases   05/23/2023: Soft tissue chest wall excision: Grade 3 IDC with skin involvement, margins negative, ER 0%, PR 0%, Ki67 70%, HER2 0 negative [facility] molecular testing: Triple negative, HER2 0, TMB 4, PD-L1 negative, no actionable mutations Weight Loss: 8-9 lbs   Cutaneous recurrence: 07/17/23: Skin biopsy: Poorly Diff carcinoma ER: 5%, PR 0%, Ki 67: 80%, HER 2: 0 I discussed with her about different options including antiestrogen therapy (ER 5% unlikely to benefit) versus capecitabine .  I discussed risks and benefits of capecitabine  and she is willing to consider taking it.   Dr. Vanderbilt do not feel that he could resect the tumor and close the chest wall.   Current treatment: Xeloda  started 08/02/2023 Xeloda  toxicities: Mildly loose stools Fatigue Dryness of the hands Anemia: Monitoring closely  Return to clinic in 1 month with labs and follow-up.  All plans to obtain scans after 3 months. ------------------------------------- Assessment and Plan Assessment & Plan Locally advanced malignancy with non-healing wound on Xeloda  Managed with Xeloda . Wound not drying, raising concerns about treatment effectiveness. Imaging planned to assess treatment. Alternative treatments considered if Xeloda  ineffective. - Continue Xeloda  as prescribed. - Encourage use of moisturizer on hands and feet every 4-6 hours. - Encourage increased water intake. - Keep appointment with wound care clinic on the 21st for potential additional treatments like ozone or hyperbaric oxygen. - Plan imaging in the future to assess treatment effectiveness.  Anemia secondary to chemotherapy Anemia likely related to Xeloda  with hemoglobin dropping from 11.2 to 10.4. Not due to iron deficiency as red cell size is normal. - Monitor hemoglobin levels, ensuring she remains stable around 10. - Encourage consumption of iron-rich foods such as greens and meats.  Hypertension, well-controlled on  medication Blood pressure well-controlled on medication with recent reading of 122/70. No symptoms like dizziness or lightheadedness. - Continue current blood pressure medication regimen. - Monitor blood pressure regularly, especially if symptoms like dizziness or lightheadedness occur.      No orders of the defined types were placed in this encounter.  The patient has a good understanding of the overall plan. she agrees with it. she will call with any problems that may develop before the next visit here. Total time spent: 30 mins including  face to face time and time spent for planning, charting and co-ordination of care   Viinay K Kaleigh Spiegelman, MD 08/31/23

## 2023-09-01 DIAGNOSIS — M1712 Unilateral primary osteoarthritis, left knee: Secondary | ICD-10-CM | POA: Diagnosis not present

## 2023-09-01 DIAGNOSIS — C50311 Malignant neoplasm of lower-inner quadrant of right female breast: Secondary | ICD-10-CM | POA: Diagnosis not present

## 2023-09-01 DIAGNOSIS — C7989 Secondary malignant neoplasm of other specified sites: Secondary | ICD-10-CM | POA: Diagnosis not present

## 2023-09-01 DIAGNOSIS — Z9181 History of falling: Secondary | ICD-10-CM | POA: Diagnosis not present

## 2023-09-01 DIAGNOSIS — Z9011 Acquired absence of right breast and nipple: Secondary | ICD-10-CM | POA: Diagnosis not present

## 2023-09-01 DIAGNOSIS — Z483 Aftercare following surgery for neoplasm: Secondary | ICD-10-CM | POA: Diagnosis not present

## 2023-09-01 DIAGNOSIS — Z85828 Personal history of other malignant neoplasm of skin: Secondary | ICD-10-CM | POA: Diagnosis not present

## 2023-09-01 DIAGNOSIS — I890 Lymphedema, not elsewhere classified: Secondary | ICD-10-CM | POA: Diagnosis not present

## 2023-09-01 DIAGNOSIS — E7800 Pure hypercholesterolemia, unspecified: Secondary | ICD-10-CM | POA: Diagnosis not present

## 2023-09-01 DIAGNOSIS — S21101D Unspecified open wound of right front wall of thorax without penetration into thoracic cavity, subsequent encounter: Secondary | ICD-10-CM | POA: Diagnosis not present

## 2023-09-01 DIAGNOSIS — I10 Essential (primary) hypertension: Secondary | ICD-10-CM | POA: Diagnosis not present

## 2023-09-01 DIAGNOSIS — L84 Corns and callosities: Secondary | ICD-10-CM | POA: Diagnosis not present

## 2023-09-01 DIAGNOSIS — H539 Unspecified visual disturbance: Secondary | ICD-10-CM | POA: Diagnosis not present

## 2023-09-01 DIAGNOSIS — B351 Tinea unguium: Secondary | ICD-10-CM | POA: Diagnosis not present

## 2023-09-01 DIAGNOSIS — D630 Anemia in neoplastic disease: Secondary | ICD-10-CM | POA: Diagnosis not present

## 2023-09-01 DIAGNOSIS — M7061 Trochanteric bursitis, right hip: Secondary | ICD-10-CM | POA: Diagnosis not present

## 2023-09-01 DIAGNOSIS — Z96651 Presence of right artificial knee joint: Secondary | ICD-10-CM | POA: Diagnosis not present

## 2023-09-04 ENCOUNTER — Other Ambulatory Visit (HOSPITAL_COMMUNITY): Payer: Self-pay

## 2023-09-05 DIAGNOSIS — M1712 Unilateral primary osteoarthritis, left knee: Secondary | ICD-10-CM | POA: Diagnosis not present

## 2023-09-05 DIAGNOSIS — Z483 Aftercare following surgery for neoplasm: Secondary | ICD-10-CM | POA: Diagnosis not present

## 2023-09-05 DIAGNOSIS — M7061 Trochanteric bursitis, right hip: Secondary | ICD-10-CM | POA: Diagnosis not present

## 2023-09-05 DIAGNOSIS — C50311 Malignant neoplasm of lower-inner quadrant of right female breast: Secondary | ICD-10-CM | POA: Diagnosis not present

## 2023-09-05 DIAGNOSIS — L84 Corns and callosities: Secondary | ICD-10-CM | POA: Diagnosis not present

## 2023-09-05 DIAGNOSIS — H539 Unspecified visual disturbance: Secondary | ICD-10-CM | POA: Diagnosis not present

## 2023-09-05 DIAGNOSIS — E7800 Pure hypercholesterolemia, unspecified: Secondary | ICD-10-CM | POA: Diagnosis not present

## 2023-09-05 DIAGNOSIS — S21101D Unspecified open wound of right front wall of thorax without penetration into thoracic cavity, subsequent encounter: Secondary | ICD-10-CM | POA: Diagnosis not present

## 2023-09-05 DIAGNOSIS — Z9011 Acquired absence of right breast and nipple: Secondary | ICD-10-CM | POA: Diagnosis not present

## 2023-09-05 DIAGNOSIS — Z96651 Presence of right artificial knee joint: Secondary | ICD-10-CM | POA: Diagnosis not present

## 2023-09-05 DIAGNOSIS — I10 Essential (primary) hypertension: Secondary | ICD-10-CM | POA: Diagnosis not present

## 2023-09-05 DIAGNOSIS — B351 Tinea unguium: Secondary | ICD-10-CM | POA: Diagnosis not present

## 2023-09-05 DIAGNOSIS — Z85828 Personal history of other malignant neoplasm of skin: Secondary | ICD-10-CM | POA: Diagnosis not present

## 2023-09-05 DIAGNOSIS — D630 Anemia in neoplastic disease: Secondary | ICD-10-CM | POA: Diagnosis not present

## 2023-09-05 DIAGNOSIS — C7989 Secondary malignant neoplasm of other specified sites: Secondary | ICD-10-CM | POA: Diagnosis not present

## 2023-09-05 DIAGNOSIS — Z9181 History of falling: Secondary | ICD-10-CM | POA: Diagnosis not present

## 2023-09-05 DIAGNOSIS — I890 Lymphedema, not elsewhere classified: Secondary | ICD-10-CM | POA: Diagnosis not present

## 2023-09-07 ENCOUNTER — Encounter (HOSPITAL_BASED_OUTPATIENT_CLINIC_OR_DEPARTMENT_OTHER): Attending: Internal Medicine | Admitting: Internal Medicine

## 2023-09-07 DIAGNOSIS — Z8614 Personal history of Methicillin resistant Staphylococcus aureus infection: Secondary | ICD-10-CM | POA: Insufficient documentation

## 2023-09-07 DIAGNOSIS — Z79899 Other long term (current) drug therapy: Secondary | ICD-10-CM | POA: Diagnosis not present

## 2023-09-07 DIAGNOSIS — Z9011 Acquired absence of right breast and nipple: Secondary | ICD-10-CM | POA: Insufficient documentation

## 2023-09-07 DIAGNOSIS — Z923 Personal history of irradiation: Secondary | ICD-10-CM | POA: Diagnosis not present

## 2023-09-07 DIAGNOSIS — C50311 Malignant neoplasm of lower-inner quadrant of right female breast: Secondary | ICD-10-CM | POA: Diagnosis not present

## 2023-09-08 ENCOUNTER — Other Ambulatory Visit: Payer: Self-pay

## 2023-09-08 DIAGNOSIS — M1712 Unilateral primary osteoarthritis, left knee: Secondary | ICD-10-CM | POA: Diagnosis not present

## 2023-09-08 DIAGNOSIS — L84 Corns and callosities: Secondary | ICD-10-CM | POA: Diagnosis not present

## 2023-09-08 DIAGNOSIS — D630 Anemia in neoplastic disease: Secondary | ICD-10-CM | POA: Diagnosis not present

## 2023-09-08 DIAGNOSIS — H539 Unspecified visual disturbance: Secondary | ICD-10-CM | POA: Diagnosis not present

## 2023-09-08 DIAGNOSIS — E7800 Pure hypercholesterolemia, unspecified: Secondary | ICD-10-CM | POA: Diagnosis not present

## 2023-09-08 DIAGNOSIS — C7989 Secondary malignant neoplasm of other specified sites: Secondary | ICD-10-CM | POA: Diagnosis not present

## 2023-09-08 DIAGNOSIS — Z85828 Personal history of other malignant neoplasm of skin: Secondary | ICD-10-CM | POA: Diagnosis not present

## 2023-09-08 DIAGNOSIS — B351 Tinea unguium: Secondary | ICD-10-CM | POA: Diagnosis not present

## 2023-09-08 DIAGNOSIS — I10 Essential (primary) hypertension: Secondary | ICD-10-CM | POA: Diagnosis not present

## 2023-09-08 DIAGNOSIS — Z9011 Acquired absence of right breast and nipple: Secondary | ICD-10-CM | POA: Diagnosis not present

## 2023-09-08 DIAGNOSIS — C50311 Malignant neoplasm of lower-inner quadrant of right female breast: Secondary | ICD-10-CM | POA: Diagnosis not present

## 2023-09-08 DIAGNOSIS — Z9181 History of falling: Secondary | ICD-10-CM | POA: Diagnosis not present

## 2023-09-08 DIAGNOSIS — Z96651 Presence of right artificial knee joint: Secondary | ICD-10-CM | POA: Diagnosis not present

## 2023-09-08 DIAGNOSIS — M7061 Trochanteric bursitis, right hip: Secondary | ICD-10-CM | POA: Diagnosis not present

## 2023-09-08 DIAGNOSIS — I890 Lymphedema, not elsewhere classified: Secondary | ICD-10-CM | POA: Diagnosis not present

## 2023-09-08 DIAGNOSIS — Z483 Aftercare following surgery for neoplasm: Secondary | ICD-10-CM | POA: Diagnosis not present

## 2023-09-08 DIAGNOSIS — S21101D Unspecified open wound of right front wall of thorax without penetration into thoracic cavity, subsequent encounter: Secondary | ICD-10-CM | POA: Diagnosis not present

## 2023-09-11 ENCOUNTER — Other Ambulatory Visit: Payer: Self-pay

## 2023-09-11 ENCOUNTER — Other Ambulatory Visit (HOSPITAL_COMMUNITY): Payer: Self-pay

## 2023-09-11 ENCOUNTER — Other Ambulatory Visit: Payer: Self-pay | Admitting: Pharmacy Technician

## 2023-09-11 DIAGNOSIS — C7989 Secondary malignant neoplasm of other specified sites: Secondary | ICD-10-CM | POA: Diagnosis not present

## 2023-09-11 DIAGNOSIS — S21101A Unspecified open wound of right front wall of thorax without penetration into thoracic cavity, initial encounter: Secondary | ICD-10-CM | POA: Diagnosis not present

## 2023-09-11 DIAGNOSIS — C50911 Malignant neoplasm of unspecified site of right female breast: Secondary | ICD-10-CM | POA: Diagnosis not present

## 2023-09-11 NOTE — Progress Notes (Signed)
 Specialty Pharmacy Refill Coordination Note  Shawna [name] is a 83 y.o. female contacted today regarding refills of specialty medication(s) Capecitabine  (XELODA )   Patient requested [name] at [facility] [place] date: 09/12/23   Medication will be filled on 09/12/23.  Patient is on off week; will resume on 9/2.

## 2023-09-12 ENCOUNTER — Other Ambulatory Visit: Payer: Self-pay

## 2023-09-13 ENCOUNTER — Telehealth: Payer: Self-pay

## 2023-09-13 DIAGNOSIS — H539 Unspecified visual disturbance: Secondary | ICD-10-CM | POA: Diagnosis not present

## 2023-09-13 DIAGNOSIS — Z96651 Presence of right artificial knee joint: Secondary | ICD-10-CM | POA: Diagnosis not present

## 2023-09-13 DIAGNOSIS — I890 Lymphedema, not elsewhere classified: Secondary | ICD-10-CM | POA: Diagnosis not present

## 2023-09-13 DIAGNOSIS — B351 Tinea unguium: Secondary | ICD-10-CM | POA: Diagnosis not present

## 2023-09-13 DIAGNOSIS — Z9011 Acquired absence of right breast and nipple: Secondary | ICD-10-CM | POA: Diagnosis not present

## 2023-09-13 DIAGNOSIS — Z483 Aftercare following surgery for neoplasm: Secondary | ICD-10-CM | POA: Diagnosis not present

## 2023-09-13 DIAGNOSIS — C7989 Secondary malignant neoplasm of other specified sites: Secondary | ICD-10-CM | POA: Diagnosis not present

## 2023-09-13 DIAGNOSIS — D630 Anemia in neoplastic disease: Secondary | ICD-10-CM | POA: Diagnosis not present

## 2023-09-13 DIAGNOSIS — I10 Essential (primary) hypertension: Secondary | ICD-10-CM | POA: Diagnosis not present

## 2023-09-13 DIAGNOSIS — C50311 Malignant neoplasm of lower-inner quadrant of right female breast: Secondary | ICD-10-CM | POA: Diagnosis not present

## 2023-09-13 DIAGNOSIS — M7061 Trochanteric bursitis, right hip: Secondary | ICD-10-CM | POA: Diagnosis not present

## 2023-09-13 DIAGNOSIS — E7800 Pure hypercholesterolemia, unspecified: Secondary | ICD-10-CM | POA: Diagnosis not present

## 2023-09-13 DIAGNOSIS — Z85828 Personal history of other malignant neoplasm of skin: Secondary | ICD-10-CM | POA: Diagnosis not present

## 2023-09-13 DIAGNOSIS — Z9181 History of falling: Secondary | ICD-10-CM | POA: Diagnosis not present

## 2023-09-13 DIAGNOSIS — S21101D Unspecified open wound of right front wall of thorax without penetration into thoracic cavity, subsequent encounter: Secondary | ICD-10-CM | POA: Diagnosis not present

## 2023-09-13 DIAGNOSIS — M1712 Unilateral primary osteoarthritis, left knee: Secondary | ICD-10-CM | POA: Diagnosis not present

## 2023-09-13 DIAGNOSIS — L84 Corns and callosities: Secondary | ICD-10-CM | POA: Diagnosis not present

## 2023-09-13 NOTE — Progress Notes (Incomplete)
 83 y.o. [id number] female with right metastatic breast cancer here for vaginal complaints. Widowed.  Patient's last menstrual period was 01/17/1982 (approximate).   She reports noticed vaginal pain and discomfort 2-3 weeks ago when started antibiotics for a wound. Vaginal discharge, white flakes when she wipes. Currently taking a chemo pill.  Currently has a pessary, using replense twice a week.  She has tried nothing.  OB History  Gravida Para Term Preterm AB Living  2 2 2  0 0 2  SAB IAB Ectopic Multiple Live Births  0 0 0 0 2    # Outcome Date GA Lbr Len/2nd Weight Sex Type Anes PTL Lv  2 Term 1970    M Vag-Spont   LIV  1 Term 1969    F Vag-Spont   LIV   Past Medical History:  Diagnosis Date   Arthritis    Breast cancer (HCC) 11/2014   Family history of breast cancer    Family history of colon cancer    History of kidney stones    History of radiation therapy 08/11/15- 09/23/2015   Right Breast   History of radiation therapy 11/13/17- 12/29/17   Right chest wall and IM nodes 50.04 Gy in 28 fractions, Right supraclavicular and PAB nodes 50.04 Gy in 28 fractions, Right Chest wall boost/ 10 Gy in 5 fractions.    Hyperlipidemia    Hypertension    Monoallelic mutation of NBN gene    Osteoporosis    Pessary maintenance    Pneumonia    Skin cancer of nose    Past Surgical History:  Procedure Laterality Date   APPENDECTOMY     bilateral cataract surgery      BREAST LUMPECTOMY WITH RADIOACTIVE SEED AND SENTINEL LYMPH NODE BIOPSY Right 02/03/2015   Procedure: RIGHT BREAST LUMPECTOMY WITH RADIOACTIVE SEED AND RIGHT SENTINEL LYMPH NODE BIOPSY;  Surgeon: Alm Angle, MD;  Location: [facility];  Service: General;  Laterality: Right;   BREAST SURGERY  11/2012   biopsy, benign breast tissue   COLONOSCOPY  06/2010   rec   LITHOTRIPSY     kidney stone on left side   MASS EXCISION Right 05/23/2023   Procedure: EXCISION, MASS, CHEST WALL;  Surgeon: Vanderbilt Ned, MD;   Location: [facility];  Service: General;  Laterality: Right;  WIDE EXCISION RIGHT CHEST WALL BREAST CANCER   MASTECTOMY W/ SENTINEL NODE BIOPSY Right 03/07/2017   Procedure: RIGHT TOTAL MASTECTOMY WITH RIGHT AXILLARY SENTINEL LYMPH NODE BIOPSY;  Surgeon: Angle Alm, MD;  Location: [facility] OR;  Service: General;  Laterality: Right;   OVARIAN CYST REMOVAL  age 34   POLYPECTOMY  04/2005   colon   PORTACATH PLACEMENT Left 02/03/2015   Procedure: INSERTION PORT-A-CATH WITH ULTRA SOUND GUIDANCE;  Surgeon: Alm Angle, MD;  Location: [facility];  Service: General;  Laterality: Left;   portacath removal      reexcision  10/09/2017   Dr. Angle, chest wall excision for recurrent breast cancer.    TOTAL KNEE ARTHROPLASTY Right 05/04/2021   Procedure: TOTAL KNEE ARTHROPLASTY;  Surgeon: Ernie Cough, MD;  Location: [facility];  Service: Orthopedics;  Laterality: Right;   Current Outpatient Medications on File Prior to Visit  Medication Sig Dispense Refill   acetaminophen  (TYLENOL ) 650 MG CR tablet Take 650 mg by mouth every 8 (eight) hours as needed for pain.     Ascorbic Acid (VITAMIN C PO) Take by mouth.     Calcium  500-2.5 MG-MCG CHEW  capecitabine  (XELODA ) 500 MG tablet Take 2 tablets (1,000 mg total) by mouth 2 (two) times daily after a meal. 14 days on and 7 days off 56 tablet 6   CRESTOR  20 MG tablet Take 20 mg by mouth in the morning.  0   hydrochlorothiazide  (HYDRODIURIL ) 12.5 MG tablet Take 12.5 mg by mouth in the morning.     losartan  (COZAAR ) 100 MG tablet Take 100 mg by mouth in the morning.  0   Multiple Vitamin (MULTIVITAMIN) tablet Take 1 tablet by mouth daily.     Polyethyl Glyc-Propyl Glyc PF (SYSTANE PRESERVATIVE FREE) 0.4-0.3 % SOLN Place 1-2 drops into both eyes 3 (three) times daily as needed (dry/irritated eyes).     rosuvastatin  (CRESTOR ) 20 MG tablet Take 20 mg by mouth daily.     Vibegron (GEMTESA) 75 MG TABS 1 tablet Orally Once a day     zinc  gluconate 50 MG tablet Take 50 mg by mouth daily. (Patient not taking: Reported on 09/14/2023)     No current facility-administered medications on file prior to visit.   No Known Allergies    PE Today's Vitals   09/14/23 0935  BP: 110/60  Pulse: 86  Temp: 98 F (36.7 C)  TempSrc: Oral  SpO2: 99%  Weight: 134 lb (60.8 kg)  Height: 5' 8.75 (1.746 m)   Body mass index is 19.93 kg/m.  Physical Exam Vitals reviewed. Exam conducted with a chaperone present.  Constitutional:      General: She is not in acute distress.    Appearance: Normal appearance.  HENT:     Head: Normocephalic and atraumatic.     Nose: Nose normal.  Eyes:     Extraocular Movements: Extraocular movements intact.     Conjunctiva/sclera: Conjunctivae normal.  Pulmonary:     Effort: Pulmonary effort is normal.  Genitourinary:    General: Normal vulva.     Exam position: Lithotomy position.     Vagina: Vaginal discharge present.     Cervix: Normal. No cervical motion tenderness, discharge or lesion.     Uterus: Normal. Not enlarged and not tender.      Adnexa: Right adnexa normal and left adnexa normal.     Comments: Large amount of discharge and vulvovaginal erythema Pessary was removed,cleaned, and reinserted Musculoskeletal:        General: Normal range of motion.     Cervical back: Normal range of motion.  Neurological:     General: No focal deficit present.     Mental Status: She is alert.  Psychiatric:        Mood and Affect: Mood normal.        Behavior: Behavior normal.      Assessment and Plan:        Vaginal irritation -     WET PREP FOR TRICH, YEAST, CLUE -     Fluconazole ; Take 1 tablet (150 mg total) by mouth every 3 (three) days for 3 doses.  Dispense: 3 tablet; Refill: 0 -     Clotrimazole -Betamethasone ; Apply 1 Application topically 2 (two) times daily for 14 days. To vulva and perineum  Dispense: 30 g; Refill: 0  Pessary maintenance  Yeast vaginitis  RTO as scheduled    Vera LULLA Pa, MD

## 2023-09-13 NOTE — Telephone Encounter (Signed)
 Patient called stating she is having vaginal irritation, vaginal pain & swelling. No urinary frequency or pain. Appt scheduled with Dr Dallie at 09-14-23

## 2023-09-14 ENCOUNTER — Ambulatory Visit: Payer: Self-pay | Admitting: Obstetrics and Gynecology

## 2023-09-14 ENCOUNTER — Encounter: Payer: Self-pay | Admitting: Obstetrics and Gynecology

## 2023-09-14 ENCOUNTER — Ambulatory Visit (INDEPENDENT_AMBULATORY_CARE_PROVIDER_SITE_OTHER): Admitting: Obstetrics and Gynecology

## 2023-09-14 VITALS — BP 110/60 | HR 86 | Temp 98.0°F | Ht 68.75 in | Wt 134.0 lb

## 2023-09-14 DIAGNOSIS — B3731 Acute candidiasis of vulva and vagina: Secondary | ICD-10-CM | POA: Diagnosis not present

## 2023-09-14 DIAGNOSIS — N898 Other specified noninflammatory disorders of vagina: Secondary | ICD-10-CM | POA: Diagnosis not present

## 2023-09-14 DIAGNOSIS — Z4689 Encounter for fitting and adjustment of other specified devices: Secondary | ICD-10-CM

## 2023-09-14 LAB — WET PREP FOR TRICH, YEAST, CLUE

## 2023-09-14 MED ORDER — CLOTRIMAZOLE-BETAMETHASONE 1-0.05 % EX CREA: 1.0000 | TOPICAL_CREAM | Freq: Two times a day (BID) | CUTANEOUS | 0 refills | Status: DC

## 2023-09-14 MED ORDER — FLUCONAZOLE 150 MG PO TABS: 150.0000 mg | ORAL_TABLET | ORAL | 0 refills | Status: DC

## 2023-09-15 DIAGNOSIS — L84 Corns and callosities: Secondary | ICD-10-CM | POA: Diagnosis not present

## 2023-09-15 DIAGNOSIS — M1712 Unilateral primary osteoarthritis, left knee: Secondary | ICD-10-CM | POA: Diagnosis not present

## 2023-09-15 DIAGNOSIS — Z483 Aftercare following surgery for neoplasm: Secondary | ICD-10-CM | POA: Diagnosis not present

## 2023-09-15 DIAGNOSIS — Z85828 Personal history of other malignant neoplasm of skin: Secondary | ICD-10-CM | POA: Diagnosis not present

## 2023-09-15 DIAGNOSIS — C7989 Secondary malignant neoplasm of other specified sites: Secondary | ICD-10-CM | POA: Diagnosis not present

## 2023-09-15 DIAGNOSIS — E7800 Pure hypercholesterolemia, unspecified: Secondary | ICD-10-CM | POA: Diagnosis not present

## 2023-09-15 DIAGNOSIS — C50311 Malignant neoplasm of lower-inner quadrant of right female breast: Secondary | ICD-10-CM | POA: Diagnosis not present

## 2023-09-15 DIAGNOSIS — H539 Unspecified visual disturbance: Secondary | ICD-10-CM | POA: Diagnosis not present

## 2023-09-15 DIAGNOSIS — B351 Tinea unguium: Secondary | ICD-10-CM | POA: Diagnosis not present

## 2023-09-15 DIAGNOSIS — S21101D Unspecified open wound of right front wall of thorax without penetration into thoracic cavity, subsequent encounter: Secondary | ICD-10-CM | POA: Diagnosis not present

## 2023-09-15 DIAGNOSIS — Z96651 Presence of right artificial knee joint: Secondary | ICD-10-CM | POA: Diagnosis not present

## 2023-09-15 DIAGNOSIS — M7061 Trochanteric bursitis, right hip: Secondary | ICD-10-CM | POA: Diagnosis not present

## 2023-09-15 DIAGNOSIS — D630 Anemia in neoplastic disease: Secondary | ICD-10-CM | POA: Diagnosis not present

## 2023-09-15 DIAGNOSIS — Z9011 Acquired absence of right breast and nipple: Secondary | ICD-10-CM | POA: Diagnosis not present

## 2023-09-15 DIAGNOSIS — I10 Essential (primary) hypertension: Secondary | ICD-10-CM | POA: Diagnosis not present

## 2023-09-15 DIAGNOSIS — Z9181 History of falling: Secondary | ICD-10-CM | POA: Diagnosis not present

## 2023-09-15 DIAGNOSIS — I890 Lymphedema, not elsewhere classified: Secondary | ICD-10-CM | POA: Diagnosis not present

## 2023-09-19 DIAGNOSIS — Z9181 History of falling: Secondary | ICD-10-CM | POA: Diagnosis not present

## 2023-09-19 DIAGNOSIS — H539 Unspecified visual disturbance: Secondary | ICD-10-CM | POA: Diagnosis not present

## 2023-09-19 DIAGNOSIS — S21101D Unspecified open wound of right front wall of thorax without penetration into thoracic cavity, subsequent encounter: Secondary | ICD-10-CM | POA: Diagnosis not present

## 2023-09-19 DIAGNOSIS — Z483 Aftercare following surgery for neoplasm: Secondary | ICD-10-CM | POA: Diagnosis not present

## 2023-09-19 DIAGNOSIS — Z96651 Presence of right artificial knee joint: Secondary | ICD-10-CM | POA: Diagnosis not present

## 2023-09-19 DIAGNOSIS — M7061 Trochanteric bursitis, right hip: Secondary | ICD-10-CM | POA: Diagnosis not present

## 2023-09-19 DIAGNOSIS — E7800 Pure hypercholesterolemia, unspecified: Secondary | ICD-10-CM | POA: Diagnosis not present

## 2023-09-19 DIAGNOSIS — Z85828 Personal history of other malignant neoplasm of skin: Secondary | ICD-10-CM | POA: Diagnosis not present

## 2023-09-19 DIAGNOSIS — C50311 Malignant neoplasm of lower-inner quadrant of right female breast: Secondary | ICD-10-CM | POA: Diagnosis not present

## 2023-09-19 DIAGNOSIS — I10 Essential (primary) hypertension: Secondary | ICD-10-CM | POA: Diagnosis not present

## 2023-09-19 DIAGNOSIS — D630 Anemia in neoplastic disease: Secondary | ICD-10-CM | POA: Diagnosis not present

## 2023-09-19 DIAGNOSIS — Z9011 Acquired absence of right breast and nipple: Secondary | ICD-10-CM | POA: Diagnosis not present

## 2023-09-19 DIAGNOSIS — L84 Corns and callosities: Secondary | ICD-10-CM | POA: Diagnosis not present

## 2023-09-19 DIAGNOSIS — C7989 Secondary malignant neoplasm of other specified sites: Secondary | ICD-10-CM | POA: Diagnosis not present

## 2023-09-19 DIAGNOSIS — B351 Tinea unguium: Secondary | ICD-10-CM | POA: Diagnosis not present

## 2023-09-19 DIAGNOSIS — I890 Lymphedema, not elsewhere classified: Secondary | ICD-10-CM | POA: Diagnosis not present

## 2023-09-19 DIAGNOSIS — M1712 Unilateral primary osteoarthritis, left knee: Secondary | ICD-10-CM | POA: Diagnosis not present

## 2023-09-26 ENCOUNTER — Inpatient Hospital Stay: Admitting: Hematology and Oncology

## 2023-09-26 ENCOUNTER — Inpatient Hospital Stay: Attending: Hematology and Oncology

## 2023-09-26 VITALS — BP 130/78 | HR 71 | Temp 97.7°F | Resp 16 | Ht 68.75 in | Wt 134.4 lb

## 2023-09-26 DIAGNOSIS — Z17421 Hormone receptor negative with human epidermal growth factor receptor 2 negative status: Secondary | ICD-10-CM | POA: Diagnosis not present

## 2023-09-26 DIAGNOSIS — Z9181 History of falling: Secondary | ICD-10-CM | POA: Diagnosis not present

## 2023-09-26 DIAGNOSIS — E7800 Pure hypercholesterolemia, unspecified: Secondary | ICD-10-CM | POA: Diagnosis not present

## 2023-09-26 DIAGNOSIS — Z9011 Acquired absence of right breast and nipple: Secondary | ICD-10-CM | POA: Diagnosis not present

## 2023-09-26 DIAGNOSIS — D6481 Anemia due to antineoplastic chemotherapy: Secondary | ICD-10-CM | POA: Diagnosis not present

## 2023-09-26 DIAGNOSIS — C50311 Malignant neoplasm of lower-inner quadrant of right female breast: Secondary | ICD-10-CM

## 2023-09-26 DIAGNOSIS — C7989 Secondary malignant neoplasm of other specified sites: Secondary | ICD-10-CM | POA: Diagnosis not present

## 2023-09-26 DIAGNOSIS — I10 Essential (primary) hypertension: Secondary | ICD-10-CM | POA: Diagnosis not present

## 2023-09-26 DIAGNOSIS — B351 Tinea unguium: Secondary | ICD-10-CM | POA: Diagnosis not present

## 2023-09-26 DIAGNOSIS — Z85828 Personal history of other malignant neoplasm of skin: Secondary | ICD-10-CM | POA: Diagnosis not present

## 2023-09-26 DIAGNOSIS — Z1722 Progesterone receptor negative status: Secondary | ICD-10-CM | POA: Insufficient documentation

## 2023-09-26 DIAGNOSIS — H539 Unspecified visual disturbance: Secondary | ICD-10-CM | POA: Diagnosis not present

## 2023-09-26 DIAGNOSIS — Z79899 Other long term (current) drug therapy: Secondary | ICD-10-CM | POA: Diagnosis not present

## 2023-09-26 DIAGNOSIS — D630 Anemia in neoplastic disease: Secondary | ICD-10-CM | POA: Diagnosis not present

## 2023-09-26 DIAGNOSIS — M1712 Unilateral primary osteoarthritis, left knee: Secondary | ICD-10-CM | POA: Diagnosis not present

## 2023-09-26 DIAGNOSIS — Z171 Estrogen receptor negative status [ER-]: Secondary | ICD-10-CM | POA: Diagnosis not present

## 2023-09-26 DIAGNOSIS — C50911 Malignant neoplasm of unspecified site of right female breast: Secondary | ICD-10-CM

## 2023-09-26 DIAGNOSIS — Z96651 Presence of right artificial knee joint: Secondary | ICD-10-CM | POA: Diagnosis not present

## 2023-09-26 DIAGNOSIS — T451X5A Adverse effect of antineoplastic and immunosuppressive drugs, initial encounter: Secondary | ICD-10-CM | POA: Insufficient documentation

## 2023-09-26 DIAGNOSIS — I890 Lymphedema, not elsewhere classified: Secondary | ICD-10-CM | POA: Diagnosis not present

## 2023-09-26 DIAGNOSIS — M7061 Trochanteric bursitis, right hip: Secondary | ICD-10-CM | POA: Diagnosis not present

## 2023-09-26 DIAGNOSIS — Z483 Aftercare following surgery for neoplasm: Secondary | ICD-10-CM | POA: Diagnosis not present

## 2023-09-26 DIAGNOSIS — L84 Corns and callosities: Secondary | ICD-10-CM | POA: Diagnosis not present

## 2023-09-26 LAB — CBC WITH DIFFERENTIAL (CANCER CENTER ONLY)
Abs Immature Granulocytes: 0.01 K/uL (ref 0.00–0.07)
Basophils Absolute: 0.1 K/uL (ref 0.0–0.1)
Basophils Relative: 1 %
Eosinophils Absolute: 0.1 K/uL (ref 0.0–0.5)
Eosinophils Relative: 2 %
HCT: 34.9 % — ABNORMAL LOW (ref 36.0–46.0)
Hemoglobin: 11.4 g/dL — ABNORMAL LOW (ref 12.0–15.0)
Immature Granulocytes: 0 %
Lymphocytes Relative: 16 %
Lymphs Abs: 0.7 K/uL (ref 0.7–4.0)
MCH: 31.8 pg (ref 26.0–34.0)
MCHC: 32.7 g/dL (ref 30.0–36.0)
MCV: 97.2 fL (ref 80.0–100.0)
Monocytes Absolute: 0.3 K/uL (ref 0.1–1.0)
Monocytes Relative: 6 %
Neutro Abs: 3.5 K/uL (ref 1.7–7.7)
Neutrophils Relative %: 75 %
Platelet Count: 228 K/uL (ref 150–400)
RBC: 3.59 MIL/uL — ABNORMAL LOW (ref 3.87–5.11)
RDW: 22.5 % — ABNORMAL HIGH (ref 11.5–15.5)
WBC Count: 4.7 K/uL (ref 4.0–10.5)
nRBC: 0 % (ref 0.0–0.2)

## 2023-09-26 LAB — CMP (CANCER CENTER ONLY)
ALT: 39 U/L (ref 0–44)
AST: 37 U/L (ref 15–41)
Albumin: 4 g/dL (ref 3.5–5.0)
Alkaline Phosphatase: 86 U/L (ref 38–126)
Anion gap: 7 (ref 5–15)
BUN: 25 mg/dL — ABNORMAL HIGH (ref 8–23)
CO2: 27 mmol/L (ref 22–32)
Calcium: 9.4 mg/dL (ref 8.9–10.3)
Chloride: 104 mmol/L (ref 98–111)
Creatinine: 1.14 mg/dL — ABNORMAL HIGH (ref 0.44–1.00)
GFR, Estimated: 48 mL/min — ABNORMAL LOW (ref >60)
Glucose, Bld: 97 mg/dL (ref 70–99)
Potassium: 4 mmol/L (ref 3.5–5.1)
Sodium: 138 mmol/L (ref 135–145)
Total Bilirubin: 0.9 mg/dL (ref 0.0–1.2)
Total Protein: 6.7 g/dL (ref 6.5–8.1)

## 2023-09-26 MED ORDER — CAPECITABINE 500 MG PO TABS: 1000.0000 mg | ORAL_TABLET | ORAL | Status: DC

## 2023-09-26 NOTE — Assessment & Plan Note (Signed)
 Right lumpectomy 02/03/2015: Invasive ductal carcinoma grade 2, 2.1 cm, with associated DCIS, margins are negative, 0/1 lymph node, ER 0%, PR 0%, HER-2 positive ratio 2.31, T2 N0 stage II a. (Right breast biopsy 3:30 position 12/09/2014: Invasive ductal carcinoma grade 3, ER 0%, PR 0%, HER-2 negative ratio 1.5, Ki-67 90%, 1 cm irregular mass T1C N0 stage IA clinical stage) Genetic counseling revealed NBN mutation   2. Adjuvant chemotherapy with TCH 6 cycles started 03/09/15- 06/22/15 took Herceptin  maintenance until 02/15/2016 3. Followed by radiation therapy 08/11/2015 to 09/22/2015 4. Cutaneous recurrence: 03/07/2017: Right mastectomy: IDC grade 3, 2 foci spanning 1.2 cm and 1.1 cm, lymphovascular invasion is present including dermal lymphatics, margins negative, 0/2 lymph nodes negative, ER 0%, PR 0%, HER-2 negative, Ki-67 not done,pT4b (skin involvement) N0 stage IIIc 5. Adjuvant radiation therapy 11/14/2017-12/29/2017 ----------------------------------------------------------------------------------------------- Patient decided not to receive any further systemic chemotherapy. There is no role of antiestrogen therapy because she was triple negative.   Second Chest wall Recurrence: 04/10/2023: [facility] dermatology Dr. 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  No evidence of distant metastases 05/23/2023: Soft tissue chest wall excision: Grade 3 IDC with skin involvement, margins negative, ER 0%, PR 0%, Ki67 70%, HER2 0 negative [facility] molecular testing: Triple negative, HER2 0, TMB 4, PD-L1 negative, no actionable mutations   Third Cutaneous recurrence: 07/17/23: Skin biopsy: Poorly Diff carcinoma ER: 5%, PR 0%, Ki 67: 80%, HER 2: 0  Dr. Vanderbilt do not feel that he could resect the tumor and close the chest wall.    Current treatment: Xeloda  started  08/02/2023 Xeloda  toxicities: Mildly loose stools Fatigue Dryness of the hands Anemia: Monitoring closely   Return to clinic in 1 month with labs, scans and follow-up.

## 2023-09-26 NOTE — Progress Notes (Signed)
 Patient Care Team: Onita Rush, MD as PCP - General (Internal Medicine) Ethyl Lenis, MD as Consulting Physician (General Surgery) Odean Potts, MD as Medical Oncologist (Hematology and Oncology) Crawford, Morna Pickle, NP as Nurse Practitioner (Hematology and Oncology) Jannis Kate Norris, MD as Consulting Physician (Obstetrics and Gynecology) Glean Stephane BROCKS, RN (Inactive) as Oncology Nurse Navigator Tyree Nanetta SAILOR, RN as Oncology Nurse Navigator Lucila Rush LABOR, RPH-CPP as Pharmacist (Hematology and Oncology)  DIAGNOSIS:  Encounter Diagnosis  Name Primary?   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 Genes tested include:  ATM, BARD1, BRCA1, BRCA2, BRIP1, CDH1, CHEK2, EPCAM, FANCC, MLH1, MSH2, MSH6, NBN, PALB2, PMS2, PTEN, RAD51C, RAD51D, TP53, and XRCC2.   02/03/2015 Surgery   Right lumpectomy: Invasive ductal carcinoma grade 2, 2.1 cm, with associated DCIS, margins are negative, 0/1 lymph node, ER 0%, PR 0%, HER-2 positive ratio 2.31, T2 N0 stage II a   03/09/2015 - 06/22/2015 Chemotherapy   Adjuvant chemotherapy with TCH 6 followed by Herceptin  maintenance for 1 year   08/11/2015 - 09/23/2015 Radiation Therapy   Adjuvant radiation therapy Audry):  1) Right Breast / 50 Gy in 25 fractions ; 2) Right Breast Boost / 10 Gy in 5 fractions   01/30/2017 Relapse/Recurrence   Skin biopsy right breast: Metastatic breast cancer GCDFP strongly  positive   02/15/2017 PET scan   Hypermetabolic focus of skin thickening along the medial right breast/chest wall consistent with recurrent disease, no other hypermetabolic metastases identified.   03/07/2017 Surgery   Right mastectomy: IDC grade 3, 2 foci spanning 1.2 cm and 1.1 cm, lymphovascular invasion is present including dermal lymphatics, margins negative, 0/2 lymph nodes negative, ER 0%, PR 0%, HER-2 negative, Ki-67 not done,pT4b (skin involvement) N0 stage IIIc   10/12/2017 Surgery   Chest wall Excision: Breast cancer   11/14/2017 - 12/29/2017 Radiation Therapy   Adjuvant right chest wall XRT     CHIEF COMPLIANT: Follow-up on Xeloda   HISTORY OF PRESENT ILLNESS:  History of Present Illness Shawna [name] is an 83 year old female with cancer who presents with hand-foot syndrome symptoms.  She experiences pain in her hands, particularly in the thumbs, with associated skin peeling and redness. These symptoms have been present for a few weeks and are exacerbated by activities such as opening a doorknob or putting on earrings. She is moisturizing her hands to manage the symptoms.  She is on a treatment regimen of two pills in the morning and two in the evening, with a cycle of two weeks on and one week off, currently in the second week of her cycle.  She experiences slight dehydration and occasional bowel movements described as small, firm stools. Her hemoglobin level is 11.4.     ALLERGIES:  has no known allergies.  MEDICATIONS:  Current Outpatient Medications  Medication Sig Dispense Refill   acetaminophen  (TYLENOL ) 650 MG CR tablet Take 650 mg by mouth every 8 (eight) hours as needed for pain.  Ascorbic Acid (VITAMIN C PO) Take by mouth.     Calcium  500-2.5 MG-MCG CHEW      capecitabine  (XELODA ) 500 MG tablet Take 2 tablets (1,000 mg total) by mouth 2 (two) times daily after a meal. 14 days on and 7 days off 56 tablet 6   Polyethyl Glyc-Propyl Glyc PF (SYSTANE  PRESERVATIVE FREE) 0.4-0.3 % SOLN Place 1-2 drops into both eyes 3 (three) times daily as needed (dry/irritated eyes).     rosuvastatin  (CRESTOR ) 20 MG tablet Take 20 mg by mouth daily.     Vibegron (GEMTESA) 75 MG TABS 1 tablet Orally Once a day     hydrochlorothiazide  (HYDRODIURIL ) 12.5 MG tablet Take 12.5 mg by mouth in the morning.     losartan  (COZAAR ) 100 MG tablet Take 100 mg by mouth in the morning.  0   Multiple Vitamin (MULTIVITAMIN) tablet Take 1 tablet by mouth daily.     zinc gluconate 50 MG tablet Take 50 mg by mouth daily. (Patient not taking: Reported on 09/26/2023)     No current facility-administered medications for this visit.    PHYSICAL EXAMINATION: ECOG PERFORMANCE STATUS: 1 - Symptomatic but completely ambulatory  Vitals:   09/26/23 1100  BP: 130/78  Pulse: 71  Resp: 16  Temp: 97.7 F (36.5 C)  SpO2: 100%   Filed Weights   09/26/23 1100  Weight: 134 lb 6.4 oz (61 kg)    Physical Exam        LABORATORY DATA:  I have reviewed the data as listed    Latest Ref Rng & Units 09/26/2023   11:05 AM 08/31/2023   12:14 PM 08/07/2023   12:46 PM  CMP  Glucose 70 - 99 mg/dL 97  888  96   BUN 8 - 23 mg/dL 25  23  26    Creatinine 0.44 - 1.00 mg/dL 8.85  8.83  8.85   Sodium 135 - 145 mmol/L 138  136  137   Potassium 3.5 - 5.1 mmol/L 4.0  3.7  4.0   Chloride 98 - 111 mmol/L 104  102  101   CO2 22 - 32 mmol/L 27  26  26    Calcium  8.9 - 10.3 mg/dL 9.4  9.0  9.3   Total Protein 6.5 - 8.1 g/dL 6.7  6.7  7.1   Total Bilirubin 0.0 - 1.2 mg/dL 0.9  0.7  0.6   Alkaline Phos 38 - 126 U/L 86  85  122   AST 15 - 41 U/L 37  18  22   ALT 0 - 44 U/L 39  13  17     Lab Results  Component Value Date   WBC 4.7 09/26/2023   HGB 11.4 (L) 09/26/2023   HCT 34.9 (L) 09/26/2023   MCV 97.2 09/26/2023   PLT 228 09/26/2023   NEUTROABS 3.5 09/26/2023    ASSESSMENT & PLAN:  Breast cancer of lower-inner quadrant of right female breast (HCC) Right lumpectomy 02/03/2015:  Invasive ductal carcinoma grade 2, 2.1 cm, with associated DCIS, margins are negative, 0/1 lymph node, ER 0%, PR 0%, HER-2 positive ratio 2.31, T2 N0 stage II a. (Right breast biopsy 3:30 position 12/09/2014: Invasive ductal carcinoma grade 3, ER 0%, PR 0%, HER-2 negative ratio 1.5, Ki-67 90%, 1 cm irregular mass T1C N0 stage IA clinical stage) Genetic counseling revealed NBN mutation   2. Adjuvant chemotherapy with TCH 6 cycles started 03/09/15- 06/22/15 took Herceptin  maintenance until 02/15/2016 3. Followed by radiation therapy 08/11/2015 to 09/22/2015  4. Cutaneous recurrence: 03/07/2017: Right mastectomy: IDC grade 3, 2 foci spanning 1.2 cm and 1.1 cm, lymphovascular invasion is present including dermal lymphatics, margins negative, 0/2 lymph nodes negative, ER 0%, PR 0%, HER-2 negative, Ki-67 not done,pT4b (skin involvement) N0 stage IIIc 5. Adjuvant radiation therapy 11/14/2017-12/29/2017 ----------------------------------------------------------------------------------------------- Patient decided not to receive any further systemic chemotherapy. There is no role of antiestrogen therapy because she was triple negative.   Second Chest wall Recurrence: 04/10/2023: [facility] Dr. 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  No evidence of distant metastases 05/23/2023: Soft tissue chest wall excision: Grade 3 IDC with skin involvement, margins negative, ER 0%, PR 0%, Ki67 70%, HER2 0 negative [facility] molecular testing: Triple negative, HER2 0, TMB 4, PD-L1 negative, no actionable mutations   Third Cutaneous recurrence: 07/17/23: Skin biopsy: Poorly Diff carcinoma ER: 5%, PR 0%, Ki 67: 80%, HER 2: 0  Dr. Vanderbilt do not feel that he could resect the tumor and close the chest wall.   Chest wall wound: Recent antibiotics and then antifungal treatments.   Patient thinks that the wound is slowly improving.  Current treatment: Xeloda  started 08/02/2023 Xeloda  toxicities: Fatigue Dryness of the hands Anemia: Monitoring closely Hand-foot syndrome: I recommended that she stop Xeloda  at this point and wait 2 weeks for the hands to resolve by applying regular moisturizers.  Once rash is better she will start back on 1000 mg in a.m. and 5 mg in p.m.  Plan to obtain scans in 1 month and follow-up after that to discuss results    Assessment & Plan Malignant neoplasm of lower-inner quadrant of right breast Biopsied nodule flattened, indicating positive treatment response. - Order CT scan in a month. - Schedule follow-up appointment a week after CT scan to review results.  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 Current capecitabine  regimen causing significant side effects. - Discontinue capecitabine  for two weeks to allow skin to heal. - Resume capecitabine  at reduced dose of two tablets in the morning and one tablet in the evening after two-week break. - Advise moisturizing hands regularly.  Non-healing wound of right breast Non-healing wound showing improvement.  Anemia secondary to chemotherapy Hemoglobin level 11.4, improved from previous levels.  Follow-Up Plan to reassess condition in a month with CT scan and follow-up appointment. - Schedule follow-up appointment in a month after CT scan.      No orders of the defined types were placed in this encounter.  The patient has a good understanding of the overall plan. she agrees with it. she will call with any problems that may develop before the next visit here. Total time spent: 30 mins including face to face time and time spent for planning, charting and co-ordination of care   Naomi MARLA Chad, MD 09/26/23

## 2023-09-26 NOTE — Progress Notes (Signed)
 GYNECOLOGY  VISIT   HPI: 83 y.o.   Widowed  Caucasian female   G2P2002 with Patient's last menstrual period was 01/17/1982 (approximate).   here for: Pessary check.  Uses a Gelhorn pessary 2 3/4 inches.  She does feel prolapse during the day, which limits her urinary stream.  At night when she voids, she has a better stream.  Her symptoms are currently tolerable.   Denies vaginal bleeding or pain.   Using Replens twice weekly.  I recommended discontinuation of her vaginal estrogen cream at her office visit in June, 2025.      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  She is now off her chem pill this week and next due to skin changes in her hands and feet. She may need to do additional surgery on her chest wall.   Taking Gemtesa for overactive bladder.  Seen 8/28 for yeast vulvovaginitis and was treated with Diflucan  and Lotrisone  cream.   GYNECOLOGIC HISTORY: Patient's last menstrual period was 01/17/1982 (approximate). Contraception:  PMP Menopausal hormone therapy:  n/a Last 2 paps:  02/24/15 neg History of abnormal Pap or positive HPV:  no Mammogram:  03/13/23 Breast Density Cat C, BIRADS Cat 1 neg         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, right (HCC) 02/24/2017   Anemia 11/03/2015   Edema 07/15/2015   Monoallelic mutation of NBN gene    Genetic testing 01/13/2015    Family history of breast cancer    Family history of colon cancer    Breast cancer of lower-inner quadrant of right female breast (HCC) 12/30/2014   Basal cell carcinoma of skin 10/30/2014   Abnormal results of liver function studies 10/30/2014   Callosity 10/30/2014   Age-related osteoporosis without current pathological fracture 07/31/2008   Essential hypertension 07/31/2008    Past Medical History:  Diagnosis Date   Arthritis    Breast cancer (HCC) 11/2014   Family history of breast cancer    Family history of colon cancer    History of kidney stones    History of radiation therapy 08/11/15- 09/23/2015   Right Breast   History of radiation therapy 11/13/17- 12/29/17   Right chest wall and IM nodes 50.04 Gy in 28 fractions, Right supraclavicular and PAB nodes 50.04 Gy in 28 fractions, Right Chest wall boost/ 10 Gy in 5 fractions.    Hyperlipidemia    Hypertension    Monoallelic mutation of NBN gene    Osteoporosis    Pessary maintenance    Pneumonia    Skin cancer of nose     Past  Surgical History:  Procedure Laterality Date   APPENDECTOMY     bilateral cataract surgery      BREAST LUMPECTOMY WITH RADIOACTIVE SEED AND SENTINEL LYMPH NODE BIOPSY Right 02/03/2015   Procedure: RIGHT BREAST LUMPECTOMY WITH RADIOACTIVE SEED AND RIGHT SENTINEL LYMPH NODE BIOPSY;  Surgeon: Alm Angle, MD;  Location: [facility];  Service: General;  Laterality: Right;   BREAST SURGERY  11/2012   biopsy, benign breast tissue   COLONOSCOPY  06/2010   rec   LITHOTRIPSY     kidney stone on left side   MASS EXCISION Right 05/23/2023   Procedure: EXCISION, MASS, CHEST WALL;  Surgeon: Vanderbilt Ned, MD;  Location: [facility];  Service: General;  Laterality: Right;  WIDE EXCISION RIGHT CHEST WALL BREAST CANCER   MASTECTOMY W/ SENTINEL NODE BIOPSY Right 03/07/2017   Procedure: RIGHT TOTAL MASTECTOMY WITH RIGHT AXILLARY SENTINEL LYMPH NODE BIOPSY;  Surgeon: Angle Alm, MD;   Location: [facility] OR;  Service: General;  Laterality: Right;   OVARIAN CYST REMOVAL  age 22   POLYPECTOMY  04/2005   colon   PORTACATH PLACEMENT Left 02/03/2015   Procedure: INSERTION PORT-A-CATH WITH ULTRA SOUND GUIDANCE;  Surgeon: Alm Angle, MD;  Location: [facility];  Service: General;  Laterality: Left;   portacath removal      reexcision  10/09/2017   Dr. Angle, chest wall excision for recurrent breast cancer.    TOTAL KNEE ARTHROPLASTY Right 05/04/2021   Procedure: TOTAL KNEE ARTHROPLASTY;  Surgeon: Ernie Cough, MD;  Location: [facility];  Service: Orthopedics;  Laterality: Right;    Current Outpatient Medications  Medication Sig Dispense Refill   acetaminophen  (TYLENOL ) 650 MG CR tablet Take 650 mg by mouth every 8 (eight) hours as needed for pain.     Ascorbic Acid (VITAMIN C PO) Take by mouth.     Calcium  500-2.5 MG-MCG CHEW      capecitabine  (XELODA ) 500 MG tablet Take 2 tablets (1,000 mg total) by mouth as directed. 2 tabs in AM and 1 tab in PM 14 days on and 7 days off     hydrochlorothiazide  (HYDRODIURIL ) 12.5 MG tablet Take 12.5 mg by mouth in the morning.     losartan  (COZAAR ) 100 MG tablet Take 100 mg by mouth in the morning.  0   Polyethyl Glyc-Propyl Glyc PF (SYSTANE PRESERVATIVE FREE) 0.4-0.3 % SOLN Place 1-2 drops into both eyes 3 (three) times daily as needed (dry/irritated eyes).     rosuvastatin  (CRESTOR ) 20 MG tablet Take 20 mg by mouth daily.     Vibegron (GEMTESA) 75 MG TABS 1 tablet Orally Once a day     Multiple Vitamin (MULTIVITAMIN) tablet Take 1 tablet by mouth daily. (Patient not taking: Reported on 10/02/2023)     zinc gluconate 50 MG tablet Take 50 mg by mouth daily. (Patient not taking: Reported on 10/02/2023)     No current facility-administered medications for this visit.     ALLERGIES: Patient has no known allergies.  Family History  Problem Relation Age of Onset   Colon cancer Sister 56       died at 19   Breast cancer Mother 66    Hypertension Mother    Osteoporosis Mother    Heart disease Father        CABG   Dementia Father    Throat cancer Brother 100   Breast cancer Sister 40   Lymphoma Sister 68   Melanoma Sister    Skin cancer  Brother    Skin cancer Daughter     Social History   Socioeconomic History   Marital status: Widowed    Spouse name: Not on file   Number of children: 2   Years of education: Not on file   Highest education level: Not on file  Occupational History   Not on file  Tobacco Use   Smoking status: Never   Smokeless tobacco: Never  Vaping Use   Vaping status: Never Used  Substance and Sexual Activity   Alcohol  use: Never   Drug use: No   Sexual activity: Not Currently    Partners: Male    Birth control/protection: Post-menopausal  Other Topics Concern   Not on file  Social History Narrative   Not on file   Social Drivers of Health   Financial Resource Strain: Not on file  Food Insecurity: No Food Insecurity (05/19/2023)   Hunger Vital Sign    Worried About Running Out of Food in the Last Year: Never true    Ran Out of Food in the Last Year: Never true  Transportation Needs: No Transportation Needs (05/19/2023)   PRAPARE - Administrator, Civil Service (Medical): No    Lack of Transportation (Non-Medical): No  Physical Activity: Not on file  Stress: Not on file  Social Connections: Not on file  Intimate Partner Violence: Not At Risk (05/19/2023)   Humiliation, Afraid, Rape, and Kick questionnaire    Fear of Current or Ex-Partner: No    Emotionally Abused: No    Physically Abused: No    Sexually Abused: No    Review of Systems  All other systems reviewed and are negative.   PHYSICAL EXAMINATION:   BP 118/72 (BP Location: Left Arm, Patient Position: Sitting)   Pulse 87   LMP 01/17/1982 (Approximate)   SpO2 96%     General appearance: alert, cooperative and appears stated age   Pelvic: External genitalia:  no lesions              Urethra:  normal  appearing urethra with no masses, tenderness or lesions              Bartholins and Skenes: normal                 Vagina: normal appearing vagina with normal color and discharge, no lesions              Cervix: no lesions                Bimanual Exam:  Uterus:  normal size, contour, position, consistency, mobility, non-tender              Adnexa: no mass, fullness, tenderness   Pessary removed, cleansed and replaced.  Pessary is holding the prolapse well.   Chaperone was present for exam:  Jada M, CMA  ASSESSMENT:  Pessary maintenance.  Cystocele and uterine prolapse.  Overactive bladder.  On Gemtesa.  Status post right mastectomy for breast cancer recurrence.  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  Status post excision of chest wall ductal carcinoma with skin involvement.  NBN gene mutation, one copy, which increases of breast cancer  PLAN:  Continue current pessary.  I would not recommend increasing the pessary size at this time.  Continue Replens.  Next pessary check in 3 - 4 months.   25 min  total time was spent for this patient encounter, including preparation, face-to-face counseling with the patient, coordination of care,  and documentation of the encounter.

## 2023-09-29 DIAGNOSIS — M7061 Trochanteric bursitis, right hip: Secondary | ICD-10-CM | POA: Diagnosis not present

## 2023-09-29 DIAGNOSIS — Z483 Aftercare following surgery for neoplasm: Secondary | ICD-10-CM | POA: Diagnosis not present

## 2023-09-29 DIAGNOSIS — Z79899 Other long term (current) drug therapy: Secondary | ICD-10-CM | POA: Diagnosis not present

## 2023-09-29 DIAGNOSIS — Z9181 History of falling: Secondary | ICD-10-CM | POA: Diagnosis not present

## 2023-09-29 DIAGNOSIS — Z9011 Acquired absence of right breast and nipple: Secondary | ICD-10-CM | POA: Diagnosis not present

## 2023-09-29 DIAGNOSIS — Z96651 Presence of right artificial knee joint: Secondary | ICD-10-CM | POA: Diagnosis not present

## 2023-09-29 DIAGNOSIS — C7989 Secondary malignant neoplasm of other specified sites: Secondary | ICD-10-CM | POA: Diagnosis not present

## 2023-09-29 DIAGNOSIS — I890 Lymphedema, not elsewhere classified: Secondary | ICD-10-CM | POA: Diagnosis not present

## 2023-09-29 DIAGNOSIS — E7800 Pure hypercholesterolemia, unspecified: Secondary | ICD-10-CM | POA: Diagnosis not present

## 2023-09-29 DIAGNOSIS — D630 Anemia in neoplastic disease: Secondary | ICD-10-CM | POA: Diagnosis not present

## 2023-09-29 DIAGNOSIS — L84 Corns and callosities: Secondary | ICD-10-CM | POA: Diagnosis not present

## 2023-09-29 DIAGNOSIS — Z85828 Personal history of other malignant neoplasm of skin: Secondary | ICD-10-CM | POA: Diagnosis not present

## 2023-09-29 DIAGNOSIS — I10 Essential (primary) hypertension: Secondary | ICD-10-CM | POA: Diagnosis not present

## 2023-09-29 DIAGNOSIS — M1712 Unilateral primary osteoarthritis, left knee: Secondary | ICD-10-CM | POA: Diagnosis not present

## 2023-09-29 DIAGNOSIS — B351 Tinea unguium: Secondary | ICD-10-CM | POA: Diagnosis not present

## 2023-09-29 DIAGNOSIS — H539 Unspecified visual disturbance: Secondary | ICD-10-CM | POA: Diagnosis not present

## 2023-09-29 DIAGNOSIS — C50311 Malignant neoplasm of lower-inner quadrant of right female breast: Secondary | ICD-10-CM | POA: Diagnosis not present

## 2023-10-02 ENCOUNTER — Other Ambulatory Visit: Payer: Self-pay

## 2023-10-02 ENCOUNTER — Encounter: Payer: Self-pay | Admitting: Obstetrics and Gynecology

## 2023-10-02 ENCOUNTER — Ambulatory Visit (INDEPENDENT_AMBULATORY_CARE_PROVIDER_SITE_OTHER): Admitting: Obstetrics and Gynecology

## 2023-10-02 VITALS — BP 118/72 | HR 87

## 2023-10-02 DIAGNOSIS — Z4689 Encounter for fitting and adjustment of other specified devices: Secondary | ICD-10-CM

## 2023-10-02 DIAGNOSIS — N8111 Cystocele, midline: Secondary | ICD-10-CM | POA: Diagnosis not present

## 2023-10-02 DIAGNOSIS — N814 Uterovaginal prolapse, unspecified: Secondary | ICD-10-CM

## 2023-10-03 DIAGNOSIS — I10 Essential (primary) hypertension: Secondary | ICD-10-CM | POA: Diagnosis not present

## 2023-10-03 DIAGNOSIS — C50811 Malignant neoplasm of overlapping sites of right female breast: Secondary | ICD-10-CM | POA: Diagnosis not present

## 2023-10-03 DIAGNOSIS — B351 Tinea unguium: Secondary | ICD-10-CM | POA: Diagnosis not present

## 2023-10-03 DIAGNOSIS — Z483 Aftercare following surgery for neoplasm: Secondary | ICD-10-CM | POA: Diagnosis not present

## 2023-10-03 DIAGNOSIS — E7800 Pure hypercholesterolemia, unspecified: Secondary | ICD-10-CM | POA: Diagnosis not present

## 2023-10-03 DIAGNOSIS — C7989 Secondary malignant neoplasm of other specified sites: Secondary | ICD-10-CM | POA: Diagnosis not present

## 2023-10-03 DIAGNOSIS — L84 Corns and callosities: Secondary | ICD-10-CM | POA: Diagnosis not present

## 2023-10-03 DIAGNOSIS — I890 Lymphedema, not elsewhere classified: Secondary | ICD-10-CM | POA: Diagnosis not present

## 2023-10-03 DIAGNOSIS — Z9011 Acquired absence of right breast and nipple: Secondary | ICD-10-CM | POA: Diagnosis not present

## 2023-10-03 DIAGNOSIS — Z96651 Presence of right artificial knee joint: Secondary | ICD-10-CM | POA: Diagnosis not present

## 2023-10-03 DIAGNOSIS — D630 Anemia in neoplastic disease: Secondary | ICD-10-CM | POA: Diagnosis not present

## 2023-10-03 DIAGNOSIS — M7061 Trochanteric bursitis, right hip: Secondary | ICD-10-CM | POA: Diagnosis not present

## 2023-10-03 DIAGNOSIS — H539 Unspecified visual disturbance: Secondary | ICD-10-CM | POA: Diagnosis not present

## 2023-10-03 DIAGNOSIS — Z79899 Other long term (current) drug therapy: Secondary | ICD-10-CM | POA: Diagnosis not present

## 2023-10-03 DIAGNOSIS — Z85828 Personal history of other malignant neoplasm of skin: Secondary | ICD-10-CM | POA: Diagnosis not present

## 2023-10-03 DIAGNOSIS — C50311 Malignant neoplasm of lower-inner quadrant of right female breast: Secondary | ICD-10-CM | POA: Diagnosis not present

## 2023-10-03 DIAGNOSIS — Z9181 History of falling: Secondary | ICD-10-CM | POA: Diagnosis not present

## 2023-10-03 DIAGNOSIS — L7682 Other postprocedural complications of skin and subcutaneous tissue: Secondary | ICD-10-CM | POA: Diagnosis not present

## 2023-10-03 DIAGNOSIS — M1712 Unilateral primary osteoarthritis, left knee: Secondary | ICD-10-CM | POA: Diagnosis not present

## 2023-10-06 ENCOUNTER — Other Ambulatory Visit (HOSPITAL_COMMUNITY): Payer: Self-pay

## 2023-10-06 ENCOUNTER — Other Ambulatory Visit: Payer: Self-pay | Admitting: Hematology and Oncology

## 2023-10-06 ENCOUNTER — Other Ambulatory Visit: Payer: Self-pay

## 2023-10-06 DIAGNOSIS — L84 Corns and callosities: Secondary | ICD-10-CM | POA: Diagnosis not present

## 2023-10-06 DIAGNOSIS — D630 Anemia in neoplastic disease: Secondary | ICD-10-CM | POA: Diagnosis not present

## 2023-10-06 DIAGNOSIS — I890 Lymphedema, not elsewhere classified: Secondary | ICD-10-CM | POA: Diagnosis not present

## 2023-10-06 DIAGNOSIS — Z9181 History of falling: Secondary | ICD-10-CM | POA: Diagnosis not present

## 2023-10-06 DIAGNOSIS — C50311 Malignant neoplasm of lower-inner quadrant of right female breast: Secondary | ICD-10-CM | POA: Diagnosis not present

## 2023-10-06 DIAGNOSIS — E7800 Pure hypercholesterolemia, unspecified: Secondary | ICD-10-CM | POA: Diagnosis not present

## 2023-10-06 DIAGNOSIS — H539 Unspecified visual disturbance: Secondary | ICD-10-CM | POA: Diagnosis not present

## 2023-10-06 DIAGNOSIS — I10 Essential (primary) hypertension: Secondary | ICD-10-CM | POA: Diagnosis not present

## 2023-10-06 DIAGNOSIS — M1712 Unilateral primary osteoarthritis, left knee: Secondary | ICD-10-CM | POA: Diagnosis not present

## 2023-10-06 DIAGNOSIS — B351 Tinea unguium: Secondary | ICD-10-CM | POA: Diagnosis not present

## 2023-10-06 DIAGNOSIS — Z483 Aftercare following surgery for neoplasm: Secondary | ICD-10-CM | POA: Diagnosis not present

## 2023-10-06 DIAGNOSIS — Z85828 Personal history of other malignant neoplasm of skin: Secondary | ICD-10-CM | POA: Diagnosis not present

## 2023-10-06 DIAGNOSIS — Z9011 Acquired absence of right breast and nipple: Secondary | ICD-10-CM | POA: Diagnosis not present

## 2023-10-06 DIAGNOSIS — Z96651 Presence of right artificial knee joint: Secondary | ICD-10-CM | POA: Diagnosis not present

## 2023-10-06 DIAGNOSIS — C7989 Secondary malignant neoplasm of other specified sites: Secondary | ICD-10-CM | POA: Diagnosis not present

## 2023-10-06 DIAGNOSIS — Z79899 Other long term (current) drug therapy: Secondary | ICD-10-CM | POA: Diagnosis not present

## 2023-10-06 DIAGNOSIS — M7061 Trochanteric bursitis, right hip: Secondary | ICD-10-CM | POA: Diagnosis not present

## 2023-10-09 ENCOUNTER — Other Ambulatory Visit (HOSPITAL_COMMUNITY): Payer: Self-pay

## 2023-10-09 ENCOUNTER — Other Ambulatory Visit: Payer: Self-pay

## 2023-10-09 ENCOUNTER — Other Ambulatory Visit: Payer: Self-pay | Admitting: Pharmacist

## 2023-10-09 ENCOUNTER — Other Ambulatory Visit: Payer: Self-pay | Admitting: Hematology and Oncology

## 2023-10-09 ENCOUNTER — Telehealth: Payer: Self-pay | Admitting: *Deleted

## 2023-10-09 MED ORDER — CAPECITABINE 500 MG PO TABS
ORAL_TABLET | ORAL | 6 refills | Status: DC
  Filled 2023-10-09: qty 42, fill #0
  Filled 2023-10-11: qty 42, 21d supply, fill #0
  Filled 2023-10-30 (×2): qty 42, 21d supply, fill #1
  Filled 2023-11-16 (×3): qty 42, 21d supply, fill #2
  Filled 2023-12-04 – 2023-12-08 (×2): qty 42, 21d supply, fill #3
  Filled 2023-12-28 – 2023-12-29 (×2): qty 42, 21d supply, fill #4
  Filled 2024-01-17 – 2024-01-19 (×2): qty 42, 21d supply, fill #5

## 2023-10-09 NOTE — Telephone Encounter (Signed)
 Patient called asking about refill. She states her Xeloda  was put on hold due to skin rash and that has since improved.  She has one week of medication on hand and requests refill.  Dosing 1000 mg am and 500 mg pm.    Can you please review her order and make sure that it was received?  I see that it was ordered on 09/26/2023 but she hasn't received anything from [facility].     Please call patient to advise if refilled.

## 2023-10-10 ENCOUNTER — Other Ambulatory Visit: Payer: Self-pay

## 2023-10-10 DIAGNOSIS — D630 Anemia in neoplastic disease: Secondary | ICD-10-CM | POA: Diagnosis not present

## 2023-10-10 DIAGNOSIS — H539 Unspecified visual disturbance: Secondary | ICD-10-CM | POA: Diagnosis not present

## 2023-10-10 DIAGNOSIS — Z85828 Personal history of other malignant neoplasm of skin: Secondary | ICD-10-CM | POA: Diagnosis not present

## 2023-10-10 DIAGNOSIS — I10 Essential (primary) hypertension: Secondary | ICD-10-CM | POA: Diagnosis not present

## 2023-10-10 DIAGNOSIS — M7061 Trochanteric bursitis, right hip: Secondary | ICD-10-CM | POA: Diagnosis not present

## 2023-10-10 DIAGNOSIS — I890 Lymphedema, not elsewhere classified: Secondary | ICD-10-CM | POA: Diagnosis not present

## 2023-10-10 DIAGNOSIS — C50311 Malignant neoplasm of lower-inner quadrant of right female breast: Secondary | ICD-10-CM | POA: Diagnosis not present

## 2023-10-10 DIAGNOSIS — Z79899 Other long term (current) drug therapy: Secondary | ICD-10-CM | POA: Diagnosis not present

## 2023-10-10 DIAGNOSIS — E7800 Pure hypercholesterolemia, unspecified: Secondary | ICD-10-CM | POA: Diagnosis not present

## 2023-10-10 DIAGNOSIS — Z96651 Presence of right artificial knee joint: Secondary | ICD-10-CM | POA: Diagnosis not present

## 2023-10-10 DIAGNOSIS — M1712 Unilateral primary osteoarthritis, left knee: Secondary | ICD-10-CM | POA: Diagnosis not present

## 2023-10-10 DIAGNOSIS — Z483 Aftercare following surgery for neoplasm: Secondary | ICD-10-CM | POA: Diagnosis not present

## 2023-10-10 DIAGNOSIS — Z9011 Acquired absence of right breast and nipple: Secondary | ICD-10-CM | POA: Diagnosis not present

## 2023-10-10 DIAGNOSIS — Z9181 History of falling: Secondary | ICD-10-CM | POA: Diagnosis not present

## 2023-10-10 DIAGNOSIS — L84 Corns and callosities: Secondary | ICD-10-CM | POA: Diagnosis not present

## 2023-10-10 DIAGNOSIS — B351 Tinea unguium: Secondary | ICD-10-CM | POA: Diagnosis not present

## 2023-10-10 DIAGNOSIS — C7989 Secondary malignant neoplasm of other specified sites: Secondary | ICD-10-CM | POA: Diagnosis not present

## 2023-10-11 ENCOUNTER — Other Ambulatory Visit: Payer: Self-pay

## 2023-10-11 ENCOUNTER — Encounter (INDEPENDENT_AMBULATORY_CARE_PROVIDER_SITE_OTHER): Payer: Self-pay

## 2023-10-11 NOTE — Progress Notes (Signed)
 Specialty Pharmacy Refill Coordination Note  Shawna [name] is a 83 y.o. female contacted today regarding refills of specialty medication(s) Capecitabine  (XELODA )   Patient requested (Patient-Rptd) Pickup at [facility] at [place] date: (Patient-Rptd) 10/13/23   Medication will be filled on 10/12/23.

## 2023-10-12 ENCOUNTER — Other Ambulatory Visit: Payer: Self-pay

## 2023-10-13 DIAGNOSIS — Z79899 Other long term (current) drug therapy: Secondary | ICD-10-CM | POA: Diagnosis not present

## 2023-10-13 DIAGNOSIS — I890 Lymphedema, not elsewhere classified: Secondary | ICD-10-CM | POA: Diagnosis not present

## 2023-10-13 DIAGNOSIS — C50311 Malignant neoplasm of lower-inner quadrant of right female breast: Secondary | ICD-10-CM | POA: Diagnosis not present

## 2023-10-13 DIAGNOSIS — B351 Tinea unguium: Secondary | ICD-10-CM | POA: Diagnosis not present

## 2023-10-13 DIAGNOSIS — M7061 Trochanteric bursitis, right hip: Secondary | ICD-10-CM | POA: Diagnosis not present

## 2023-10-13 DIAGNOSIS — Z9181 History of falling: Secondary | ICD-10-CM | POA: Diagnosis not present

## 2023-10-13 DIAGNOSIS — D630 Anemia in neoplastic disease: Secondary | ICD-10-CM | POA: Diagnosis not present

## 2023-10-13 DIAGNOSIS — Z483 Aftercare following surgery for neoplasm: Secondary | ICD-10-CM | POA: Diagnosis not present

## 2023-10-13 DIAGNOSIS — Z85828 Personal history of other malignant neoplasm of skin: Secondary | ICD-10-CM | POA: Diagnosis not present

## 2023-10-13 DIAGNOSIS — Z96651 Presence of right artificial knee joint: Secondary | ICD-10-CM | POA: Diagnosis not present

## 2023-10-13 DIAGNOSIS — M1712 Unilateral primary osteoarthritis, left knee: Secondary | ICD-10-CM | POA: Diagnosis not present

## 2023-10-13 DIAGNOSIS — E7800 Pure hypercholesterolemia, unspecified: Secondary | ICD-10-CM | POA: Diagnosis not present

## 2023-10-13 DIAGNOSIS — I10 Essential (primary) hypertension: Secondary | ICD-10-CM | POA: Diagnosis not present

## 2023-10-13 DIAGNOSIS — H539 Unspecified visual disturbance: Secondary | ICD-10-CM | POA: Diagnosis not present

## 2023-10-13 DIAGNOSIS — Z9011 Acquired absence of right breast and nipple: Secondary | ICD-10-CM | POA: Diagnosis not present

## 2023-10-13 DIAGNOSIS — L84 Corns and callosities: Secondary | ICD-10-CM | POA: Diagnosis not present

## 2023-10-13 DIAGNOSIS — C7989 Secondary malignant neoplasm of other specified sites: Secondary | ICD-10-CM | POA: Diagnosis not present

## 2023-10-17 DIAGNOSIS — Z96651 Presence of right artificial knee joint: Secondary | ICD-10-CM | POA: Diagnosis not present

## 2023-10-17 DIAGNOSIS — D630 Anemia in neoplastic disease: Secondary | ICD-10-CM | POA: Diagnosis not present

## 2023-10-17 DIAGNOSIS — M7061 Trochanteric bursitis, right hip: Secondary | ICD-10-CM | POA: Diagnosis not present

## 2023-10-17 DIAGNOSIS — Z483 Aftercare following surgery for neoplasm: Secondary | ICD-10-CM | POA: Diagnosis not present

## 2023-10-17 DIAGNOSIS — L84 Corns and callosities: Secondary | ICD-10-CM | POA: Diagnosis not present

## 2023-10-17 DIAGNOSIS — M1712 Unilateral primary osteoarthritis, left knee: Secondary | ICD-10-CM | POA: Diagnosis not present

## 2023-10-17 DIAGNOSIS — Z9181 History of falling: Secondary | ICD-10-CM | POA: Diagnosis not present

## 2023-10-17 DIAGNOSIS — H539 Unspecified visual disturbance: Secondary | ICD-10-CM | POA: Diagnosis not present

## 2023-10-17 DIAGNOSIS — Z9011 Acquired absence of right breast and nipple: Secondary | ICD-10-CM | POA: Diagnosis not present

## 2023-10-17 DIAGNOSIS — Z79899 Other long term (current) drug therapy: Secondary | ICD-10-CM | POA: Diagnosis not present

## 2023-10-17 DIAGNOSIS — I10 Essential (primary) hypertension: Secondary | ICD-10-CM | POA: Diagnosis not present

## 2023-10-17 DIAGNOSIS — Z85828 Personal history of other malignant neoplasm of skin: Secondary | ICD-10-CM | POA: Diagnosis not present

## 2023-10-17 DIAGNOSIS — B351 Tinea unguium: Secondary | ICD-10-CM | POA: Diagnosis not present

## 2023-10-17 DIAGNOSIS — C50311 Malignant neoplasm of lower-inner quadrant of right female breast: Secondary | ICD-10-CM | POA: Diagnosis not present

## 2023-10-17 DIAGNOSIS — I890 Lymphedema, not elsewhere classified: Secondary | ICD-10-CM | POA: Diagnosis not present

## 2023-10-17 DIAGNOSIS — E7800 Pure hypercholesterolemia, unspecified: Secondary | ICD-10-CM | POA: Diagnosis not present

## 2023-10-17 DIAGNOSIS — C7989 Secondary malignant neoplasm of other specified sites: Secondary | ICD-10-CM | POA: Diagnosis not present

## 2023-10-20 DIAGNOSIS — Z483 Aftercare following surgery for neoplasm: Secondary | ICD-10-CM | POA: Diagnosis not present

## 2023-10-20 DIAGNOSIS — B351 Tinea unguium: Secondary | ICD-10-CM | POA: Diagnosis not present

## 2023-10-20 DIAGNOSIS — Z79899 Other long term (current) drug therapy: Secondary | ICD-10-CM | POA: Diagnosis not present

## 2023-10-20 DIAGNOSIS — D630 Anemia in neoplastic disease: Secondary | ICD-10-CM | POA: Diagnosis not present

## 2023-10-20 DIAGNOSIS — M1712 Unilateral primary osteoarthritis, left knee: Secondary | ICD-10-CM | POA: Diagnosis not present

## 2023-10-20 DIAGNOSIS — E7800 Pure hypercholesterolemia, unspecified: Secondary | ICD-10-CM | POA: Diagnosis not present

## 2023-10-20 DIAGNOSIS — Z96651 Presence of right artificial knee joint: Secondary | ICD-10-CM | POA: Diagnosis not present

## 2023-10-20 DIAGNOSIS — M7061 Trochanteric bursitis, right hip: Secondary | ICD-10-CM | POA: Diagnosis not present

## 2023-10-20 DIAGNOSIS — I890 Lymphedema, not elsewhere classified: Secondary | ICD-10-CM | POA: Diagnosis not present

## 2023-10-20 DIAGNOSIS — C7989 Secondary malignant neoplasm of other specified sites: Secondary | ICD-10-CM | POA: Diagnosis not present

## 2023-10-20 DIAGNOSIS — Z9181 History of falling: Secondary | ICD-10-CM | POA: Diagnosis not present

## 2023-10-20 DIAGNOSIS — Z85828 Personal history of other malignant neoplasm of skin: Secondary | ICD-10-CM | POA: Diagnosis not present

## 2023-10-20 DIAGNOSIS — I10 Essential (primary) hypertension: Secondary | ICD-10-CM | POA: Diagnosis not present

## 2023-10-20 DIAGNOSIS — L84 Corns and callosities: Secondary | ICD-10-CM | POA: Diagnosis not present

## 2023-10-20 DIAGNOSIS — Z9011 Acquired absence of right breast and nipple: Secondary | ICD-10-CM | POA: Diagnosis not present

## 2023-10-20 DIAGNOSIS — H539 Unspecified visual disturbance: Secondary | ICD-10-CM | POA: Diagnosis not present

## 2023-10-20 DIAGNOSIS — C50311 Malignant neoplasm of lower-inner quadrant of right female breast: Secondary | ICD-10-CM | POA: Diagnosis not present

## 2023-10-24 DIAGNOSIS — B351 Tinea unguium: Secondary | ICD-10-CM | POA: Diagnosis not present

## 2023-10-24 DIAGNOSIS — H539 Unspecified visual disturbance: Secondary | ICD-10-CM | POA: Diagnosis not present

## 2023-10-24 DIAGNOSIS — C7989 Secondary malignant neoplasm of other specified sites: Secondary | ICD-10-CM | POA: Diagnosis not present

## 2023-10-24 DIAGNOSIS — Z483 Aftercare following surgery for neoplasm: Secondary | ICD-10-CM | POA: Diagnosis not present

## 2023-10-24 DIAGNOSIS — C50311 Malignant neoplasm of lower-inner quadrant of right female breast: Secondary | ICD-10-CM | POA: Diagnosis not present

## 2023-10-24 DIAGNOSIS — I890 Lymphedema, not elsewhere classified: Secondary | ICD-10-CM | POA: Diagnosis not present

## 2023-10-24 DIAGNOSIS — I10 Essential (primary) hypertension: Secondary | ICD-10-CM | POA: Diagnosis not present

## 2023-10-24 DIAGNOSIS — E7800 Pure hypercholesterolemia, unspecified: Secondary | ICD-10-CM | POA: Diagnosis not present

## 2023-10-24 DIAGNOSIS — M7061 Trochanteric bursitis, right hip: Secondary | ICD-10-CM | POA: Diagnosis not present

## 2023-10-24 DIAGNOSIS — Z79899 Other long term (current) drug therapy: Secondary | ICD-10-CM | POA: Diagnosis not present

## 2023-10-24 DIAGNOSIS — Z9011 Acquired absence of right breast and nipple: Secondary | ICD-10-CM | POA: Diagnosis not present

## 2023-10-24 DIAGNOSIS — D630 Anemia in neoplastic disease: Secondary | ICD-10-CM | POA: Diagnosis not present

## 2023-10-24 DIAGNOSIS — Z96651 Presence of right artificial knee joint: Secondary | ICD-10-CM | POA: Diagnosis not present

## 2023-10-24 DIAGNOSIS — Z9181 History of falling: Secondary | ICD-10-CM | POA: Diagnosis not present

## 2023-10-24 DIAGNOSIS — Z85828 Personal history of other malignant neoplasm of skin: Secondary | ICD-10-CM | POA: Diagnosis not present

## 2023-10-24 DIAGNOSIS — L84 Corns and callosities: Secondary | ICD-10-CM | POA: Diagnosis not present

## 2023-10-24 DIAGNOSIS — M1712 Unilateral primary osteoarthritis, left knee: Secondary | ICD-10-CM | POA: Diagnosis not present

## 2023-10-26 ENCOUNTER — Ambulatory Visit (HOSPITAL_COMMUNITY)
Admission: RE | Admit: 2023-10-26 | Discharge: 2023-10-26 | Disposition: A | Discharge status: Home / Self Care | Source: Ambulatory Visit | Attending: Hematology and Oncology | Admitting: Hematology and Oncology

## 2023-10-26 ENCOUNTER — Inpatient Hospital Stay: Attending: Hematology and Oncology

## 2023-10-26 DIAGNOSIS — Z1722 Progesterone receptor negative status: Secondary | ICD-10-CM | POA: Insufficient documentation

## 2023-10-26 DIAGNOSIS — D6481 Anemia due to antineoplastic chemotherapy: Secondary | ICD-10-CM | POA: Insufficient documentation

## 2023-10-26 DIAGNOSIS — Z1732 Human epidermal growth factor receptor 2 negative status: Secondary | ICD-10-CM | POA: Diagnosis not present

## 2023-10-26 DIAGNOSIS — Z79899 Other long term (current) drug therapy: Secondary | ICD-10-CM | POA: Insufficient documentation

## 2023-10-26 DIAGNOSIS — Z171 Estrogen receptor negative status [ER-]: Secondary | ICD-10-CM | POA: Insufficient documentation

## 2023-10-26 DIAGNOSIS — Z9011 Acquired absence of right breast and nipple: Secondary | ICD-10-CM | POA: Insufficient documentation

## 2023-10-26 DIAGNOSIS — C50311 Malignant neoplasm of lower-inner quadrant of right female breast: Secondary | ICD-10-CM | POA: Diagnosis not present

## 2023-10-26 DIAGNOSIS — T451X5A Adverse effect of antineoplastic and immunosuppressive drugs, initial encounter: Secondary | ICD-10-CM | POA: Diagnosis not present

## 2023-10-26 DIAGNOSIS — C50919 Malignant neoplasm of unspecified site of unspecified female breast: Secondary | ICD-10-CM | POA: Diagnosis not present

## 2023-10-26 DIAGNOSIS — S2241XA Multiple fractures of ribs, right side, initial encounter for closed fracture: Secondary | ICD-10-CM | POA: Diagnosis not present

## 2023-10-26 DIAGNOSIS — N281 Cyst of kidney, acquired: Secondary | ICD-10-CM | POA: Diagnosis not present

## 2023-10-26 LAB — CBC WITH DIFFERENTIAL (CANCER CENTER ONLY)
Abs Immature Granulocytes: 0.01 K/uL (ref 0.00–0.07)
Basophils Absolute: 0 K/uL (ref 0.0–0.1)
Basophils Relative: 0 %
Eosinophils Absolute: 0.1 K/uL (ref 0.0–0.5)
Eosinophils Relative: 2 %
HCT: 34.3 % — ABNORMAL LOW (ref 36.0–46.0)
Hemoglobin: 11.6 g/dL — ABNORMAL LOW (ref 12.0–15.0)
Immature Granulocytes: 0 %
Lymphocytes Relative: 14 %
Lymphs Abs: 0.6 K/uL — ABNORMAL LOW (ref 0.7–4.0)
MCH: 34 pg (ref 26.0–34.0)
MCHC: 33.8 g/dL (ref 30.0–36.0)
MCV: 100.6 fL — ABNORMAL HIGH (ref 80.0–100.0)
Monocytes Absolute: 0.2 K/uL (ref 0.1–1.0)
Monocytes Relative: 4 %
Neutro Abs: 3.7 K/uL (ref 1.7–7.7)
Neutrophils Relative %: 80 %
Platelet Count: 249 K/uL (ref 150–400)
RBC: 3.41 MIL/uL — ABNORMAL LOW (ref 3.87–5.11)
RDW: 20.5 % — ABNORMAL HIGH (ref 11.5–15.5)
WBC Count: 4.6 K/uL (ref 4.0–10.5)
nRBC: 0 % (ref 0.0–0.2)

## 2023-10-26 LAB — CMP (CANCER CENTER ONLY)
ALT: 15 U/L (ref 0–44)
AST: 18 U/L (ref 15–41)
Albumin: 4.1 g/dL (ref 3.5–5.0)
Alkaline Phosphatase: 90 U/L (ref 38–126)
Anion gap: 7 (ref 5–15)
BUN: 28 mg/dL — ABNORMAL HIGH (ref 8–23)
CO2: 28 mmol/L (ref 22–32)
Calcium: 10 mg/dL (ref 8.9–10.3)
Chloride: 105 mmol/L (ref 98–111)
Creatinine: 1.24 mg/dL — ABNORMAL HIGH (ref 0.44–1.00)
GFR, Estimated: 44 mL/min — ABNORMAL LOW (ref >60)
Glucose, Bld: 107 mg/dL — ABNORMAL HIGH (ref 70–99)
Potassium: 3.6 mmol/L (ref 3.5–5.1)
Sodium: 140 mmol/L (ref 135–145)
Total Bilirubin: 0.8 mg/dL (ref 0.0–1.2)
Total Protein: 7.2 g/dL (ref 6.5–8.1)

## 2023-10-26 MED ORDER — SODIUM CHLORIDE (PF) 0.9 % IJ SOLN
INTRAMUSCULAR | Status: AC
  Filled 2023-10-26: qty 50

## 2023-10-26 MED ORDER — IOHEXOL 300 MG/ML  SOLN
80.0000 mL | Freq: Once | INTRAMUSCULAR | Status: AC | PRN
  Administered 2023-10-26: 80 mL via INTRAVENOUS

## 2023-10-27 LAB — CANCER ANTIGEN 27.29: CA 27.29: 48.6 U/mL — ABNORMAL HIGH (ref 0.0–38.6)

## 2023-10-28 ENCOUNTER — Encounter (INDEPENDENT_AMBULATORY_CARE_PROVIDER_SITE_OTHER): Payer: Self-pay

## 2023-10-30 ENCOUNTER — Other Ambulatory Visit: Payer: Self-pay

## 2023-10-30 NOTE — Progress Notes (Signed)
 Specialty Pharmacy Refill Coordination Note  Shawna [name] is a 83 y.o. female contacted today regarding refills of specialty medication(s) Capecitabine  (XELODA )   Patient requested [name] at [facility] at [place] date: 10/31/23   Medication will be filled on 10/30/23.

## 2023-10-31 ENCOUNTER — Other Ambulatory Visit (HOSPITAL_COMMUNITY): Payer: Self-pay

## 2023-10-31 DIAGNOSIS — E7800 Pure hypercholesterolemia, unspecified: Secondary | ICD-10-CM | POA: Diagnosis not present

## 2023-10-31 DIAGNOSIS — D630 Anemia in neoplastic disease: Secondary | ICD-10-CM | POA: Diagnosis not present

## 2023-10-31 DIAGNOSIS — I890 Lymphedema, not elsewhere classified: Secondary | ICD-10-CM | POA: Diagnosis not present

## 2023-10-31 DIAGNOSIS — Z9011 Acquired absence of right breast and nipple: Secondary | ICD-10-CM | POA: Diagnosis not present

## 2023-10-31 DIAGNOSIS — B351 Tinea unguium: Secondary | ICD-10-CM | POA: Diagnosis not present

## 2023-10-31 DIAGNOSIS — C50311 Malignant neoplasm of lower-inner quadrant of right female breast: Secondary | ICD-10-CM | POA: Diagnosis not present

## 2023-10-31 DIAGNOSIS — L84 Corns and callosities: Secondary | ICD-10-CM | POA: Diagnosis not present

## 2023-10-31 DIAGNOSIS — Z96651 Presence of right artificial knee joint: Secondary | ICD-10-CM | POA: Diagnosis not present

## 2023-10-31 DIAGNOSIS — M7061 Trochanteric bursitis, right hip: Secondary | ICD-10-CM | POA: Diagnosis not present

## 2023-10-31 DIAGNOSIS — Z85828 Personal history of other malignant neoplasm of skin: Secondary | ICD-10-CM | POA: Diagnosis not present

## 2023-10-31 DIAGNOSIS — H539 Unspecified visual disturbance: Secondary | ICD-10-CM | POA: Diagnosis not present

## 2023-10-31 DIAGNOSIS — Z9181 History of falling: Secondary | ICD-10-CM | POA: Diagnosis not present

## 2023-10-31 DIAGNOSIS — C7989 Secondary malignant neoplasm of other specified sites: Secondary | ICD-10-CM | POA: Diagnosis not present

## 2023-10-31 DIAGNOSIS — I10 Essential (primary) hypertension: Secondary | ICD-10-CM | POA: Diagnosis not present

## 2023-10-31 DIAGNOSIS — Z483 Aftercare following surgery for neoplasm: Secondary | ICD-10-CM | POA: Diagnosis not present

## 2023-10-31 DIAGNOSIS — M1712 Unilateral primary osteoarthritis, left knee: Secondary | ICD-10-CM | POA: Diagnosis not present

## 2023-10-31 DIAGNOSIS — Z79899 Other long term (current) drug therapy: Secondary | ICD-10-CM | POA: Diagnosis not present

## 2023-11-02 ENCOUNTER — Inpatient Hospital Stay: Admitting: Hematology and Oncology

## 2023-11-02 ENCOUNTER — Other Ambulatory Visit (HOSPITAL_COMMUNITY): Payer: Self-pay

## 2023-11-02 VITALS — BP 121/60 | HR 77 | Temp 98.2°F | Resp 18 | Wt 137.0 lb

## 2023-11-02 DIAGNOSIS — D6481 Anemia due to antineoplastic chemotherapy: Secondary | ICD-10-CM | POA: Diagnosis not present

## 2023-11-02 DIAGNOSIS — Z171 Estrogen receptor negative status [ER-]: Secondary | ICD-10-CM | POA: Diagnosis not present

## 2023-11-02 DIAGNOSIS — C50311 Malignant neoplasm of lower-inner quadrant of right female breast: Secondary | ICD-10-CM | POA: Diagnosis not present

## 2023-11-02 DIAGNOSIS — Z79899 Other long term (current) drug therapy: Secondary | ICD-10-CM | POA: Diagnosis not present

## 2023-11-02 DIAGNOSIS — T451X5A Adverse effect of antineoplastic and immunosuppressive drugs, initial encounter: Secondary | ICD-10-CM | POA: Diagnosis not present

## 2023-11-02 NOTE — Assessment & Plan Note (Signed)
 Right lumpectomy 02/03/2015: Invasive ductal carcinoma grade 2, 2.1 cm, with associated DCIS, margins are negative, 0/1 lymph node, ER 0%, PR 0%, HER-2 positive ratio 2.31, T2 N0 stage II a. (Right breast biopsy 3:30 position 12/09/2014: Invasive ductal carcinoma grade 3, ER 0%, PR 0%, HER-2 negative ratio 1.5, Ki-67 90%, 1 cm irregular mass T1C N0 stage IA clinical stage) Genetic counseling revealed NBN mutation   2. Adjuvant chemotherapy with TCH 6 cycles started 03/09/15- 06/22/15 took Herceptin  maintenance until 02/15/2016 3. Followed by radiation therapy 08/11/2015 to 09/22/2015 4. Cutaneous recurrence: 03/07/2017: Right mastectomy: IDC grade 3, 2 foci spanning 1.2 cm and 1.1 cm, lymphovascular invasion is present including dermal lymphatics, margins negative, 0/2 lymph nodes negative, ER 0%, PR 0%, HER-2 negative, Ki-67 not done,pT4b (skin involvement) N0 stage IIIc 5. Adjuvant radiation therapy 11/14/2017-12/29/2017 ----------------------------------------------------------------------------------------------- Second Chest wall Recurrence: 04/10/2023: [facility] dermatology Dr. 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  No evidence of distant metastases 05/23/2023: Soft tissue chest wall excision: Grade 3 IDC with skin involvement, margins negative, ER 0%, PR 0%, Ki67 70%, HER2 0 negative [facility] molecular testing: Triple negative, HER2 0, TMB 4, PD-L1 negative, no actionable mutations   Third Cutaneous recurrence: 07/17/23: Skin biopsy: Poorly Diff carcinoma ER: 5%, PR 0%, Ki 67: 80%, HER 2: 0  Dr. Vanderbilt do not feel that he could resect the tumor and close the chest wall.   Chest wall wound: Recent antibiotics and then antifungal treatments.  Patient thinks that the wound is slowly improving.   Current treatment: Xeloda  started 08/02/2023 Xeloda   toxicities: Fatigue Dryness of the hands Anemia: Monitoring closely Hand-foot syndrome: I recommended that she stop Xeloda  at this point and wait 2 weeks for the hands to resolve by applying regular moisturizers.  Once rash is better she will start back on 1000 mg in a.m. and 5 mg in p.m.   CT CAP 10/29/2023: Interval increase in the ill-defined soft tissue infiltrating anterior right chest wall which is ulcerated with underlying bony and cartilaginous destruction.  Sclerosis inferior sternal body progression versus postsurgical change

## 2023-11-02 NOTE — Progress Notes (Signed)
 Patient Care Team: Onita Rush, MD as PCP - General (Internal Medicine) Ethyl Lenis, MD as Consulting Physician (General Surgery) Odean Potts, MD as Medical Oncologist (Hematology and Oncology) Crawford Morna Pickle, NP as Nurse Practitioner (Hematology and Oncology) Jannis Kate Norris, MD as Consulting Physician (Obstetrics and Gynecology) Tyree Nanetta SAILOR, RN as Oncology Nurse Navigator Lucila Rush LABOR, RPH-CPP as Pharmacist (Hematology and Oncology)  DIAGNOSIS:  Encounter Diagnosis  Name Primary?   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 Genes tested include:  ATM, BARD1, BRCA1, BRCA2, BRIP1, CDH1, CHEK2, EPCAM, FANCC, MLH1, MSH2, MSH6, NBN, PALB2, PMS2, PTEN, RAD51C, RAD51D, TP53, and XRCC2.   02/03/2015 Surgery   Right lumpectomy: Invasive ductal carcinoma grade 2, 2.1 cm, with associated DCIS, margins are negative, 0/1 lymph node, ER 0%, PR 0%, HER-2 positive ratio 2.31, T2 N0 stage II a   03/09/2015 - 06/22/2015 Chemotherapy   Adjuvant chemotherapy with TCH 6 followed by Herceptin  maintenance for 1 year   08/11/2015 - 09/23/2015 Radiation Therapy   Adjuvant radiation therapy Audry):  1) Right Breast / 50 Gy in 25 fractions ; 2) Right Breast Boost / 10 Gy in 5 fractions   01/30/2017 Relapse/Recurrence   Skin biopsy right breast: Metastatic breast cancer GCDFP strongly positive   02/15/2017 PET scan   Hypermetabolic focus of  skin thickening along the medial right breast/chest wall consistent with recurrent disease, no other hypermetabolic metastases identified.   03/07/2017 Surgery   Right mastectomy: IDC grade 3, 2 foci spanning 1.2 cm and 1.1 cm, lymphovascular invasion is present including dermal lymphatics, margins negative, 0/2 lymph nodes negative, ER 0%, PR 0%, HER-2 negative, Ki-67 not done,pT4b (skin involvement) N0 stage IIIc   10/12/2017 Surgery   Chest wall Excision: Breast cancer   11/14/2017 - 12/29/2017 Radiation Therapy   Adjuvant right chest wall XRT     CHIEF COMPLIANT: Follow-up of cutaneous recurrence of  breast cancer  HISTORY OF PRESENT ILLNESS:  History of Present Illness Shawna [name] is an 83 year old female with breast cancer who presents for follow-up regarding tumor progression and treatment response.  The wound is not healing despite daily bandage changes. She is concerned about the effectiveness of her current medication, Xeloda . Side effects affecting her hands and feet improved during a two-week break from the medication, but she has resumed the treatment this week with a two-week on, one-week off schedule.  A new nodule within the wound was noticed about five days ago. The tumor is located towards the center of the chest wall, with some scarring of the sternum. A recent CT scan showed an increase in soft tissue infiltration and underlying bone and cartilage destruction.  She underwent a mastectomy in 2019 and has been undergoing treatment since then. She is currently on Xeloda , which she tolerates reasonably well, with some side effects.     ALLERGIES:  has no known allergies.  MEDICATIONS:  Current Outpatient Medications  Medication Sig Dispense Refill  acetaminophen  (TYLENOL ) 650 MG CR tablet Take 650 mg by mouth every 8 (eight) hours as needed for pain.     Ascorbic Acid (VITAMIN C PO) Take by mouth.     Calcium  500-2.5 MG-MCG CHEW      capecitabine  (XELODA ) 500  MG tablet 2 tabs in AM and 1 tab in PM 14 days on and 7 days off 42 tablet 6   hydrochlorothiazide  (HYDRODIURIL ) 12.5 MG tablet Take 12.5 mg by mouth in the morning.     losartan  (COZAAR ) 100 MG tablet Take 100 mg by mouth in the morning.  0   Multiple Vitamin (MULTIVITAMIN) tablet Take 1 tablet by mouth daily. (Patient not taking: Reported on 10/02/2023)     Polyethyl Glyc-Propyl Glyc PF (SYSTANE PRESERVATIVE FREE) 0.4-0.3 % SOLN Place 1-2 drops into both eyes 3 (three) times daily as needed (dry/irritated eyes).     rosuvastatin  (CRESTOR ) 20 MG tablet Take 20 mg by mouth daily.     Vibegron (GEMTESA) 75 MG TABS 1 tablet Orally Once a day     zinc gluconate 50 MG tablet Take 50 mg by mouth daily. (Patient not taking: Reported on 10/02/2023)     No current facility-administered medications for this visit.    PHYSICAL EXAMINATION: ECOG PERFORMANCE STATUS: 1 - Symptomatic but completely ambulatory  Vitals:   11/02/23 1044  BP: 121/60  Pulse: 77  Resp: 18  Temp: 98.2 F (36.8 C)  SpO2: 100%   Filed Weights   11/02/23 1044  Weight: 137 lb (62.1 kg)    Physical Exam   (exam performed in the presence of a chaperone)  LABORATORY DATA:  I have reviewed the data as listed    Latest Ref Rng & Units 10/26/2023    1:08 PM 09/26/2023   11:05 AM 08/31/2023   12:14 PM  CMP  Glucose 70 - 99 mg/dL 892  97  888   BUN 8 - 23 mg/dL 28  25  23    Creatinine 0.44 - 1.00 mg/dL 8.75  8.85  8.83   Sodium 135 - 145 mmol/L 140  138  136   Potassium 3.5 - 5.1 mmol/L 3.6  4.0  3.7   Chloride 98 - 111 mmol/L 105  104  102   CO2 22 - 32 mmol/L 28  27  26    Calcium  8.9 - 10.3 mg/dL 89.9  9.4  9.0   Total Protein 6.5 - 8.1 g/dL 7.2  6.7  6.7   Total Bilirubin 0.0 - 1.2 mg/dL 0.8  0.9  0.7   Alkaline Phos 38 - 126 U/L 90  86  85   AST 15 - 41 U/L 18  37  18   ALT 0 - 44 U/L 15  39  13     Lab Results  Component Value Date   WBC 4.6 10/26/2023   HGB 11.6 (L) 10/26/2023   HCT 34.3 (L) 10/26/2023    MCV 100.6 (H) 10/26/2023   PLT 249 10/26/2023   NEUTROABS 3.7 10/26/2023    ASSESSMENT & PLAN:  Breast cancer of lower-inner quadrant of right female breast (HCC) Right lumpectomy 02/03/2015: Invasive ductal carcinoma grade 2, 2.1 cm, with associated DCIS, margins are negative, 0/1 lymph node, ER 0%, PR 0%, HER-2 positive ratio 2.31, T2 N0 stage II a. (Right breast biopsy 3:30 position 12/09/2014: Invasive ductal carcinoma grade 3, ER 0%, PR 0%, HER-2 negative ratio 1.5, Ki-67 90%, 1 cm irregular mass T1C N0 stage IA clinical stage) Genetic counseling revealed  NBN mutation   2. Adjuvant chemotherapy with TCH 6 cycles started 03/09/15- 06/22/15 took Herceptin  maintenance until 02/15/2016 3. Followed by radiation therapy 08/11/2015 to 09/22/2015 4. Cutaneous recurrence: 03/07/2017: Right mastectomy: IDC grade 3, 2 foci spanning 1.2 cm and 1.1 cm, lymphovascular invasion is present including dermal lymphatics, margins negative, 0/2 lymph nodes negative, ER 0%, PR 0%, HER-2 negative, Ki-67 not done,pT4b (skin involvement) N0 stage IIIc 5. Adjuvant radiation therapy 11/14/2017-12/29/2017 ----------------------------------------------------------------------------------------------- Second Chest wall Recurrence: 04/10/2023: [facility] dermatology Dr. 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  No evidence of distant metastases 05/23/2023: Soft tissue chest wall excision: Grade 3 IDC with skin involvement, margins negative, ER 0%, PR 0%, Ki67 70%, HER2 0 negative [facility] molecular testing: Triple negative, HER2 0, TMB 4, PD-L1 negative, no actionable mutations   Third Cutaneous recurrence: 07/17/23: Skin biopsy: Poorly Diff carcinoma ER: 5%, PR 0%, Ki 67: 80%, HER 2: 0  Dr. Vanderbilt do not feel that he could resect the tumor and close the chest wall.   Chest wall wound:  Recent antibiotics and then antifungal treatments.  Patient thinks that the wound is slowly improving.   Current treatment: Xeloda  started 08/02/2023 Xeloda  toxicities: Fatigue Dryness of the hands Anemia: Monitoring closely Hand-foot syndrome: Since it improved, she has started back on Xeloda  at 1000 mg in a.m. and 500 mg in p.m.   CT CAP 10/29/2023: Interval increase in the ill-defined soft tissue infiltrating anterior right chest wall which is ulcerated with underlying bony and cartilaginous destruction.  Sclerosis inferior sternal body progression versus postsurgical change   Plan: PET/CT in 3 months If there is continued progression then we will consider switching her to [place] or [place]. Patient has an appointment to see Dr. Vanderbilt to look into any debridement options.  If she does require surgery then we will hold Xeloda  for 1 week before the surgery Assessment & Plan Locally advanced right breast cancer with local bone invasion and non-healing wound Tumor causing bone erosion and non-healing wound with increased soft tissue infiltration and bone destruction. No distant metastasis. Capecitabine  ineffective locally, possibly due to poor blood flow. HER2-targeted therapies considered due to new data on HER2 low populations. - Continue Capecitabine . - Schedule PET scan in three months. - Consider Enhertu or Trodelvy if ineffective after PET scan. - Discuss potential side effects of Enhertu and Trodelvy if switch needed.  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  Anemia secondary to chemotherapy No specific management discussed.  General Health Maintenance Vaccinations and screenings up to date. - Administer flu vaccine as scheduled.      No orders of the defined types were placed in this encounter.  The patient has a good understanding of the overall plan. she agrees with it. she will call with any  problems that may develop before the next visit here.  I personally spent a total of 45 minutes in the care of the patient today including preparing to see the patient, getting/reviewing separately obtained history, performing a medically appropriate exam/evaluation, counseling and educating, placing orders, referring and communicating with other health care professionals, documenting clinical information in the EHR, independently interpreting results, communicating results, and coordinating care.   Viinay K Ryhanna Dunsmore, MD 11/02/23

## 2023-11-03 ENCOUNTER — Encounter: Payer: Self-pay | Admitting: *Deleted

## 2023-11-03 DIAGNOSIS — C50911 Malignant neoplasm of unspecified site of right female breast: Secondary | ICD-10-CM | POA: Diagnosis not present

## 2023-11-03 DIAGNOSIS — C7989 Secondary malignant neoplasm of other specified sites: Secondary | ICD-10-CM | POA: Diagnosis not present

## 2023-11-03 DIAGNOSIS — L7682 Other postprocedural complications of skin and subcutaneous tissue: Secondary | ICD-10-CM | POA: Diagnosis not present

## 2023-11-03 NOTE — Progress Notes (Signed)
 Per MD request, RN mailed recent office note along with pathology reports to address on file.

## 2023-11-07 DIAGNOSIS — H539 Unspecified visual disturbance: Secondary | ICD-10-CM | POA: Diagnosis not present

## 2023-11-07 DIAGNOSIS — Z85828 Personal history of other malignant neoplasm of skin: Secondary | ICD-10-CM | POA: Diagnosis not present

## 2023-11-07 DIAGNOSIS — C50311 Malignant neoplasm of lower-inner quadrant of right female breast: Secondary | ICD-10-CM | POA: Diagnosis not present

## 2023-11-07 DIAGNOSIS — Z9181 History of falling: Secondary | ICD-10-CM | POA: Diagnosis not present

## 2023-11-07 DIAGNOSIS — Z79899 Other long term (current) drug therapy: Secondary | ICD-10-CM | POA: Diagnosis not present

## 2023-11-07 DIAGNOSIS — M7061 Trochanteric bursitis, right hip: Secondary | ICD-10-CM | POA: Diagnosis not present

## 2023-11-07 DIAGNOSIS — I10 Essential (primary) hypertension: Secondary | ICD-10-CM | POA: Diagnosis not present

## 2023-11-07 DIAGNOSIS — M1712 Unilateral primary osteoarthritis, left knee: Secondary | ICD-10-CM | POA: Diagnosis not present

## 2023-11-07 DIAGNOSIS — D630 Anemia in neoplastic disease: Secondary | ICD-10-CM | POA: Diagnosis not present

## 2023-11-07 DIAGNOSIS — Z9011 Acquired absence of right breast and nipple: Secondary | ICD-10-CM | POA: Diagnosis not present

## 2023-11-07 DIAGNOSIS — Z483 Aftercare following surgery for neoplasm: Secondary | ICD-10-CM | POA: Diagnosis not present

## 2023-11-07 DIAGNOSIS — Z96651 Presence of right artificial knee joint: Secondary | ICD-10-CM | POA: Diagnosis not present

## 2023-11-07 DIAGNOSIS — L84 Corns and callosities: Secondary | ICD-10-CM | POA: Diagnosis not present

## 2023-11-07 DIAGNOSIS — B351 Tinea unguium: Secondary | ICD-10-CM | POA: Diagnosis not present

## 2023-11-07 DIAGNOSIS — E7800 Pure hypercholesterolemia, unspecified: Secondary | ICD-10-CM | POA: Diagnosis not present

## 2023-11-07 DIAGNOSIS — I890 Lymphedema, not elsewhere classified: Secondary | ICD-10-CM | POA: Diagnosis not present

## 2023-11-07 DIAGNOSIS — C7989 Secondary malignant neoplasm of other specified sites: Secondary | ICD-10-CM | POA: Diagnosis not present

## 2023-11-10 DIAGNOSIS — Z9011 Acquired absence of right breast and nipple: Secondary | ICD-10-CM | POA: Diagnosis not present

## 2023-11-10 DIAGNOSIS — B351 Tinea unguium: Secondary | ICD-10-CM | POA: Diagnosis not present

## 2023-11-10 DIAGNOSIS — H539 Unspecified visual disturbance: Secondary | ICD-10-CM | POA: Diagnosis not present

## 2023-11-10 DIAGNOSIS — M1712 Unilateral primary osteoarthritis, left knee: Secondary | ICD-10-CM | POA: Diagnosis not present

## 2023-11-10 DIAGNOSIS — Z483 Aftercare following surgery for neoplasm: Secondary | ICD-10-CM | POA: Diagnosis not present

## 2023-11-10 DIAGNOSIS — I890 Lymphedema, not elsewhere classified: Secondary | ICD-10-CM | POA: Diagnosis not present

## 2023-11-10 DIAGNOSIS — Z9181 History of falling: Secondary | ICD-10-CM | POA: Diagnosis not present

## 2023-11-10 DIAGNOSIS — C7989 Secondary malignant neoplasm of other specified sites: Secondary | ICD-10-CM | POA: Diagnosis not present

## 2023-11-10 DIAGNOSIS — M7061 Trochanteric bursitis, right hip: Secondary | ICD-10-CM | POA: Diagnosis not present

## 2023-11-10 DIAGNOSIS — E7800 Pure hypercholesterolemia, unspecified: Secondary | ICD-10-CM | POA: Diagnosis not present

## 2023-11-10 DIAGNOSIS — C50311 Malignant neoplasm of lower-inner quadrant of right female breast: Secondary | ICD-10-CM | POA: Diagnosis not present

## 2023-11-10 DIAGNOSIS — Z85828 Personal history of other malignant neoplasm of skin: Secondary | ICD-10-CM | POA: Diagnosis not present

## 2023-11-10 DIAGNOSIS — I10 Essential (primary) hypertension: Secondary | ICD-10-CM | POA: Diagnosis not present

## 2023-11-10 DIAGNOSIS — L84 Corns and callosities: Secondary | ICD-10-CM | POA: Diagnosis not present

## 2023-11-10 DIAGNOSIS — D630 Anemia in neoplastic disease: Secondary | ICD-10-CM | POA: Diagnosis not present

## 2023-11-10 DIAGNOSIS — Z79899 Other long term (current) drug therapy: Secondary | ICD-10-CM | POA: Diagnosis not present

## 2023-11-10 DIAGNOSIS — Z96651 Presence of right artificial knee joint: Secondary | ICD-10-CM | POA: Diagnosis not present

## 2023-11-13 ENCOUNTER — Other Ambulatory Visit: Payer: Self-pay

## 2023-11-16 ENCOUNTER — Other Ambulatory Visit: Payer: Self-pay

## 2023-11-16 ENCOUNTER — Other Ambulatory Visit (HOSPITAL_COMMUNITY): Payer: Self-pay

## 2023-11-16 NOTE — Progress Notes (Signed)
 Specialty Pharmacy Ongoing Clinical Assessment Note  Shawna [name] is a 83 y.o. female who is being followed by the specialty pharmacy service for RxSp Oncology   Patient's specialty medication(s) reviewed today: Capecitabine  (XELODA )   Missed doses in the last 4 weeks: 0   Patient/Caregiver did not have any additional questions or concerns.   Therapeutic benefit summary: Patient is NOT achieving benefit   Adverse events/side effects summary: No adverse events/side effects   Patient's therapy is appropriate to: Continue    Goals Addressed             This Visit's Progress    Maintain optimal adherence to therapy   On track    Patient is on track. Patient will maintain adherence         Follow up: 3 months  [facility] Specialty Pharmacist

## 2023-11-16 NOTE — Progress Notes (Signed)
 Specialty Pharmacy Refill Coordination Note  Shawna [name] is a 83 y.o. female contacted today regarding refills of specialty medication(s) Capecitabine  (XELODA )   Patient requested [name] at [facility] at [place] date: 11/20/23   Medication will be filled on: 11/17/23

## 2023-11-17 ENCOUNTER — Other Ambulatory Visit: Payer: Self-pay

## 2023-11-20 DIAGNOSIS — Z79899 Other long term (current) drug therapy: Secondary | ICD-10-CM | POA: Diagnosis not present

## 2023-11-20 DIAGNOSIS — C50311 Malignant neoplasm of lower-inner quadrant of right female breast: Secondary | ICD-10-CM | POA: Diagnosis not present

## 2023-11-20 DIAGNOSIS — I890 Lymphedema, not elsewhere classified: Secondary | ICD-10-CM | POA: Diagnosis not present

## 2023-11-20 DIAGNOSIS — C7989 Secondary malignant neoplasm of other specified sites: Secondary | ICD-10-CM | POA: Diagnosis not present

## 2023-11-20 DIAGNOSIS — Z483 Aftercare following surgery for neoplasm: Secondary | ICD-10-CM | POA: Diagnosis not present

## 2023-11-20 DIAGNOSIS — Z9011 Acquired absence of right breast and nipple: Secondary | ICD-10-CM | POA: Diagnosis not present

## 2023-11-20 DIAGNOSIS — D630 Anemia in neoplastic disease: Secondary | ICD-10-CM | POA: Diagnosis not present

## 2023-11-20 DIAGNOSIS — Z96651 Presence of right artificial knee joint: Secondary | ICD-10-CM | POA: Diagnosis not present

## 2023-11-20 DIAGNOSIS — M1712 Unilateral primary osteoarthritis, left knee: Secondary | ICD-10-CM | POA: Diagnosis not present

## 2023-11-20 DIAGNOSIS — H539 Unspecified visual disturbance: Secondary | ICD-10-CM | POA: Diagnosis not present

## 2023-11-20 DIAGNOSIS — Z85828 Personal history of other malignant neoplasm of skin: Secondary | ICD-10-CM | POA: Diagnosis not present

## 2023-11-20 DIAGNOSIS — B351 Tinea unguium: Secondary | ICD-10-CM | POA: Diagnosis not present

## 2023-11-20 DIAGNOSIS — E7800 Pure hypercholesterolemia, unspecified: Secondary | ICD-10-CM | POA: Diagnosis not present

## 2023-11-20 DIAGNOSIS — M7061 Trochanteric bursitis, right hip: Secondary | ICD-10-CM | POA: Diagnosis not present

## 2023-11-20 DIAGNOSIS — Z9181 History of falling: Secondary | ICD-10-CM | POA: Diagnosis not present

## 2023-11-20 DIAGNOSIS — L84 Corns and callosities: Secondary | ICD-10-CM | POA: Diagnosis not present

## 2023-11-20 DIAGNOSIS — I10 Essential (primary) hypertension: Secondary | ICD-10-CM | POA: Diagnosis not present

## 2023-11-23 DIAGNOSIS — C7989 Secondary malignant neoplasm of other specified sites: Secondary | ICD-10-CM | POA: Diagnosis not present

## 2023-11-23 DIAGNOSIS — C50311 Malignant neoplasm of lower-inner quadrant of right female breast: Secondary | ICD-10-CM | POA: Diagnosis not present

## 2023-11-23 DIAGNOSIS — H539 Unspecified visual disturbance: Secondary | ICD-10-CM | POA: Diagnosis not present

## 2023-11-23 DIAGNOSIS — Z96651 Presence of right artificial knee joint: Secondary | ICD-10-CM | POA: Diagnosis not present

## 2023-11-23 DIAGNOSIS — Z483 Aftercare following surgery for neoplasm: Secondary | ICD-10-CM | POA: Diagnosis not present

## 2023-11-23 DIAGNOSIS — I10 Essential (primary) hypertension: Secondary | ICD-10-CM | POA: Diagnosis not present

## 2023-11-23 DIAGNOSIS — Z79899 Other long term (current) drug therapy: Secondary | ICD-10-CM | POA: Diagnosis not present

## 2023-11-23 DIAGNOSIS — M1712 Unilateral primary osteoarthritis, left knee: Secondary | ICD-10-CM | POA: Diagnosis not present

## 2023-11-23 DIAGNOSIS — Z9011 Acquired absence of right breast and nipple: Secondary | ICD-10-CM | POA: Diagnosis not present

## 2023-11-23 DIAGNOSIS — D630 Anemia in neoplastic disease: Secondary | ICD-10-CM | POA: Diagnosis not present

## 2023-11-23 DIAGNOSIS — Z9181 History of falling: Secondary | ICD-10-CM | POA: Diagnosis not present

## 2023-11-23 DIAGNOSIS — I890 Lymphedema, not elsewhere classified: Secondary | ICD-10-CM | POA: Diagnosis not present

## 2023-11-23 DIAGNOSIS — E7800 Pure hypercholesterolemia, unspecified: Secondary | ICD-10-CM | POA: Diagnosis not present

## 2023-11-23 DIAGNOSIS — Z85828 Personal history of other malignant neoplasm of skin: Secondary | ICD-10-CM | POA: Diagnosis not present

## 2023-11-28 DIAGNOSIS — E7800 Pure hypercholesterolemia, unspecified: Secondary | ICD-10-CM | POA: Diagnosis not present

## 2023-11-28 DIAGNOSIS — H539 Unspecified visual disturbance: Secondary | ICD-10-CM | POA: Diagnosis not present

## 2023-11-28 DIAGNOSIS — Z79899 Other long term (current) drug therapy: Secondary | ICD-10-CM | POA: Diagnosis not present

## 2023-11-28 DIAGNOSIS — Z9181 History of falling: Secondary | ICD-10-CM | POA: Diagnosis not present

## 2023-11-28 DIAGNOSIS — Z483 Aftercare following surgery for neoplasm: Secondary | ICD-10-CM | POA: Diagnosis not present

## 2023-11-28 DIAGNOSIS — M1712 Unilateral primary osteoarthritis, left knee: Secondary | ICD-10-CM | POA: Diagnosis not present

## 2023-11-28 DIAGNOSIS — C7989 Secondary malignant neoplasm of other specified sites: Secondary | ICD-10-CM | POA: Diagnosis not present

## 2023-11-28 DIAGNOSIS — D630 Anemia in neoplastic disease: Secondary | ICD-10-CM | POA: Diagnosis not present

## 2023-11-28 DIAGNOSIS — I890 Lymphedema, not elsewhere classified: Secondary | ICD-10-CM | POA: Diagnosis not present

## 2023-11-28 DIAGNOSIS — Z9011 Acquired absence of right breast and nipple: Secondary | ICD-10-CM | POA: Diagnosis not present

## 2023-11-28 DIAGNOSIS — Z96651 Presence of right artificial knee joint: Secondary | ICD-10-CM | POA: Diagnosis not present

## 2023-11-28 DIAGNOSIS — Z85828 Personal history of other malignant neoplasm of skin: Secondary | ICD-10-CM | POA: Diagnosis not present

## 2023-11-28 DIAGNOSIS — I10 Essential (primary) hypertension: Secondary | ICD-10-CM | POA: Diagnosis not present

## 2023-11-28 DIAGNOSIS — C50311 Malignant neoplasm of lower-inner quadrant of right female breast: Secondary | ICD-10-CM | POA: Diagnosis not present

## 2023-12-01 DIAGNOSIS — C7989 Secondary malignant neoplasm of other specified sites: Secondary | ICD-10-CM | POA: Diagnosis not present

## 2023-12-01 DIAGNOSIS — D630 Anemia in neoplastic disease: Secondary | ICD-10-CM | POA: Diagnosis not present

## 2023-12-01 DIAGNOSIS — Z85828 Personal history of other malignant neoplasm of skin: Secondary | ICD-10-CM | POA: Diagnosis not present

## 2023-12-01 DIAGNOSIS — I890 Lymphedema, not elsewhere classified: Secondary | ICD-10-CM | POA: Diagnosis not present

## 2023-12-01 DIAGNOSIS — Z483 Aftercare following surgery for neoplasm: Secondary | ICD-10-CM | POA: Diagnosis not present

## 2023-12-01 DIAGNOSIS — Z96651 Presence of right artificial knee joint: Secondary | ICD-10-CM | POA: Diagnosis not present

## 2023-12-01 DIAGNOSIS — H539 Unspecified visual disturbance: Secondary | ICD-10-CM | POA: Diagnosis not present

## 2023-12-01 DIAGNOSIS — M1712 Unilateral primary osteoarthritis, left knee: Secondary | ICD-10-CM | POA: Diagnosis not present

## 2023-12-01 DIAGNOSIS — Z79899 Other long term (current) drug therapy: Secondary | ICD-10-CM | POA: Diagnosis not present

## 2023-12-01 DIAGNOSIS — E7800 Pure hypercholesterolemia, unspecified: Secondary | ICD-10-CM | POA: Diagnosis not present

## 2023-12-01 DIAGNOSIS — Z9181 History of falling: Secondary | ICD-10-CM | POA: Diagnosis not present

## 2023-12-01 DIAGNOSIS — Z9011 Acquired absence of right breast and nipple: Secondary | ICD-10-CM | POA: Diagnosis not present

## 2023-12-01 DIAGNOSIS — I10 Essential (primary) hypertension: Secondary | ICD-10-CM | POA: Diagnosis not present

## 2023-12-01 DIAGNOSIS — C50311 Malignant neoplasm of lower-inner quadrant of right female breast: Secondary | ICD-10-CM | POA: Diagnosis not present

## 2023-12-04 ENCOUNTER — Telehealth: Payer: Self-pay

## 2023-12-04 ENCOUNTER — Other Ambulatory Visit: Payer: Self-pay

## 2023-12-04 DIAGNOSIS — R071 Chest pain on breathing: Secondary | ICD-10-CM

## 2023-12-04 DIAGNOSIS — C50311 Malignant neoplasm of lower-inner quadrant of right female breast: Secondary | ICD-10-CM

## 2023-12-04 NOTE — Telephone Encounter (Signed)
 Pt called and states since starting Xeloda  she has noticed her right large toe ail and 2nd toe nail have fallen off, and she's noted hardening and curling of her finger nails.   Pt also states for about 3 weeks she has noticed right sided chest pain on inspiration and when she coughs. Denies SHOB or persistent cough.   Per MD, stop Xeloda , order CT angio ASAP and schedule MD f/u 1 week post- CT. Pt is agreeable to this plan and knows we will be in touch to get her scheduled ASAP.

## 2023-12-05 DIAGNOSIS — Z85828 Personal history of other malignant neoplasm of skin: Secondary | ICD-10-CM | POA: Diagnosis not present

## 2023-12-05 DIAGNOSIS — Z483 Aftercare following surgery for neoplasm: Secondary | ICD-10-CM | POA: Diagnosis not present

## 2023-12-05 DIAGNOSIS — Z9011 Acquired absence of right breast and nipple: Secondary | ICD-10-CM | POA: Diagnosis not present

## 2023-12-05 DIAGNOSIS — C50311 Malignant neoplasm of lower-inner quadrant of right female breast: Secondary | ICD-10-CM | POA: Diagnosis not present

## 2023-12-05 DIAGNOSIS — I890 Lymphedema, not elsewhere classified: Secondary | ICD-10-CM | POA: Diagnosis not present

## 2023-12-05 DIAGNOSIS — C7989 Secondary malignant neoplasm of other specified sites: Secondary | ICD-10-CM | POA: Diagnosis not present

## 2023-12-05 DIAGNOSIS — Z96651 Presence of right artificial knee joint: Secondary | ICD-10-CM | POA: Diagnosis not present

## 2023-12-05 DIAGNOSIS — Z79899 Other long term (current) drug therapy: Secondary | ICD-10-CM | POA: Diagnosis not present

## 2023-12-05 DIAGNOSIS — M1712 Unilateral primary osteoarthritis, left knee: Secondary | ICD-10-CM | POA: Diagnosis not present

## 2023-12-05 DIAGNOSIS — H539 Unspecified visual disturbance: Secondary | ICD-10-CM | POA: Diagnosis not present

## 2023-12-05 DIAGNOSIS — I10 Essential (primary) hypertension: Secondary | ICD-10-CM | POA: Diagnosis not present

## 2023-12-05 DIAGNOSIS — D630 Anemia in neoplastic disease: Secondary | ICD-10-CM | POA: Diagnosis not present

## 2023-12-05 DIAGNOSIS — E7800 Pure hypercholesterolemia, unspecified: Secondary | ICD-10-CM | POA: Diagnosis not present

## 2023-12-05 DIAGNOSIS — Z9181 History of falling: Secondary | ICD-10-CM | POA: Diagnosis not present

## 2023-12-06 ENCOUNTER — Ambulatory Visit (HOSPITAL_COMMUNITY)
Admission: RE | Admit: 2023-12-06 | Discharge: 2023-12-06 | Disposition: A | Discharge status: Home / Self Care | Source: Ambulatory Visit | Attending: Hematology and Oncology | Admitting: Hematology and Oncology

## 2023-12-06 DIAGNOSIS — R0781 Pleurodynia: Secondary | ICD-10-CM | POA: Diagnosis not present

## 2023-12-06 DIAGNOSIS — C50311 Malignant neoplasm of lower-inner quadrant of right female breast: Secondary | ICD-10-CM | POA: Diagnosis not present

## 2023-12-06 DIAGNOSIS — C50919 Malignant neoplasm of unspecified site of unspecified female breast: Secondary | ICD-10-CM | POA: Diagnosis not present

## 2023-12-06 DIAGNOSIS — Z171 Estrogen receptor negative status [ER-]: Secondary | ICD-10-CM | POA: Insufficient documentation

## 2023-12-06 DIAGNOSIS — J8410 Pulmonary fibrosis, unspecified: Secondary | ICD-10-CM | POA: Diagnosis not present

## 2023-12-06 DIAGNOSIS — R071 Chest pain on breathing: Secondary | ICD-10-CM | POA: Insufficient documentation

## 2023-12-06 MED ORDER — IOHEXOL 350 MG/ML SOLN
60.0000 mL | Freq: Once | INTRAVENOUS | Status: AC | PRN
  Administered 2023-12-06: 60 mL via INTRAVENOUS

## 2023-12-08 ENCOUNTER — Other Ambulatory Visit (HOSPITAL_COMMUNITY): Payer: Self-pay

## 2023-12-08 DIAGNOSIS — Z85828 Personal history of other malignant neoplasm of skin: Secondary | ICD-10-CM | POA: Diagnosis not present

## 2023-12-08 DIAGNOSIS — C50311 Malignant neoplasm of lower-inner quadrant of right female breast: Secondary | ICD-10-CM | POA: Diagnosis not present

## 2023-12-08 DIAGNOSIS — Z9011 Acquired absence of right breast and nipple: Secondary | ICD-10-CM | POA: Diagnosis not present

## 2023-12-08 DIAGNOSIS — C7989 Secondary malignant neoplasm of other specified sites: Secondary | ICD-10-CM | POA: Diagnosis not present

## 2023-12-08 DIAGNOSIS — I890 Lymphedema, not elsewhere classified: Secondary | ICD-10-CM | POA: Diagnosis not present

## 2023-12-08 DIAGNOSIS — H539 Unspecified visual disturbance: Secondary | ICD-10-CM | POA: Diagnosis not present

## 2023-12-08 DIAGNOSIS — Z79899 Other long term (current) drug therapy: Secondary | ICD-10-CM | POA: Diagnosis not present

## 2023-12-08 DIAGNOSIS — Z96651 Presence of right artificial knee joint: Secondary | ICD-10-CM | POA: Diagnosis not present

## 2023-12-08 DIAGNOSIS — D630 Anemia in neoplastic disease: Secondary | ICD-10-CM | POA: Diagnosis not present

## 2023-12-08 DIAGNOSIS — M1712 Unilateral primary osteoarthritis, left knee: Secondary | ICD-10-CM | POA: Diagnosis not present

## 2023-12-08 DIAGNOSIS — Z9181 History of falling: Secondary | ICD-10-CM | POA: Diagnosis not present

## 2023-12-08 DIAGNOSIS — Z483 Aftercare following surgery for neoplasm: Secondary | ICD-10-CM | POA: Diagnosis not present

## 2023-12-08 DIAGNOSIS — E7800 Pure hypercholesterolemia, unspecified: Secondary | ICD-10-CM | POA: Diagnosis not present

## 2023-12-08 DIAGNOSIS — I10 Essential (primary) hypertension: Secondary | ICD-10-CM | POA: Diagnosis not present

## 2023-12-11 DIAGNOSIS — S21101D Unspecified open wound of right front wall of thorax without penetration into thoracic cavity, subsequent encounter: Secondary | ICD-10-CM | POA: Diagnosis not present

## 2023-12-11 DIAGNOSIS — C7989 Secondary malignant neoplasm of other specified sites: Secondary | ICD-10-CM | POA: Diagnosis not present

## 2023-12-11 DIAGNOSIS — C50911 Malignant neoplasm of unspecified site of right female breast: Secondary | ICD-10-CM | POA: Diagnosis not present

## 2023-12-12 ENCOUNTER — Inpatient Hospital Stay

## 2023-12-12 ENCOUNTER — Other Ambulatory Visit: Payer: Self-pay

## 2023-12-12 ENCOUNTER — Inpatient Hospital Stay: Attending: Hematology and Oncology | Admitting: Hematology and Oncology

## 2023-12-12 VITALS — BP 130/80 | HR 75 | Temp 97.7°F | Resp 18 | Ht 68.75 in | Wt 138.3 lb

## 2023-12-12 DIAGNOSIS — Z923 Personal history of irradiation: Secondary | ICD-10-CM | POA: Insufficient documentation

## 2023-12-12 DIAGNOSIS — I10 Essential (primary) hypertension: Secondary | ICD-10-CM | POA: Diagnosis not present

## 2023-12-12 DIAGNOSIS — Z9221 Personal history of antineoplastic chemotherapy: Secondary | ICD-10-CM | POA: Insufficient documentation

## 2023-12-12 DIAGNOSIS — E7800 Pure hypercholesterolemia, unspecified: Secondary | ICD-10-CM | POA: Diagnosis not present

## 2023-12-12 DIAGNOSIS — G893 Neoplasm related pain (acute) (chronic): Secondary | ICD-10-CM | POA: Diagnosis not present

## 2023-12-12 DIAGNOSIS — Z9181 History of falling: Secondary | ICD-10-CM | POA: Diagnosis not present

## 2023-12-12 DIAGNOSIS — Z96651 Presence of right artificial knee joint: Secondary | ICD-10-CM | POA: Diagnosis not present

## 2023-12-12 DIAGNOSIS — Z9011 Acquired absence of right breast and nipple: Secondary | ICD-10-CM | POA: Diagnosis not present

## 2023-12-12 DIAGNOSIS — C50311 Malignant neoplasm of lower-inner quadrant of right female breast: Secondary | ICD-10-CM | POA: Diagnosis not present

## 2023-12-12 DIAGNOSIS — Z483 Aftercare following surgery for neoplasm: Secondary | ICD-10-CM | POA: Diagnosis not present

## 2023-12-12 DIAGNOSIS — Z85828 Personal history of other malignant neoplasm of skin: Secondary | ICD-10-CM | POA: Diagnosis not present

## 2023-12-12 DIAGNOSIS — I890 Lymphedema, not elsewhere classified: Secondary | ICD-10-CM | POA: Diagnosis not present

## 2023-12-12 DIAGNOSIS — M1712 Unilateral primary osteoarthritis, left knee: Secondary | ICD-10-CM | POA: Diagnosis not present

## 2023-12-12 DIAGNOSIS — H539 Unspecified visual disturbance: Secondary | ICD-10-CM | POA: Diagnosis not present

## 2023-12-12 DIAGNOSIS — Z171 Estrogen receptor negative status [ER-]: Secondary | ICD-10-CM | POA: Diagnosis not present

## 2023-12-12 DIAGNOSIS — C7951 Secondary malignant neoplasm of bone: Secondary | ICD-10-CM | POA: Diagnosis not present

## 2023-12-12 DIAGNOSIS — C7989 Secondary malignant neoplasm of other specified sites: Secondary | ICD-10-CM | POA: Diagnosis not present

## 2023-12-12 DIAGNOSIS — Z79899 Other long term (current) drug therapy: Secondary | ICD-10-CM | POA: Diagnosis not present

## 2023-12-12 DIAGNOSIS — D630 Anemia in neoplastic disease: Secondary | ICD-10-CM | POA: Diagnosis not present

## 2023-12-12 LAB — CMP (CANCER CENTER ONLY)
ALT: 12 U/L (ref 0–44)
AST: 21 U/L (ref 15–41)
Albumin: 4 g/dL (ref 3.5–5.0)
Alkaline Phosphatase: 101 U/L (ref 38–126)
Anion gap: 9 (ref 5–15)
BUN: 28 mg/dL — ABNORMAL HIGH (ref 8–23)
CO2: 25 mmol/L (ref 22–32)
Calcium: 9.8 mg/dL (ref 8.9–10.3)
Chloride: 105 mmol/L (ref 98–111)
Creatinine: 1.18 mg/dL — ABNORMAL HIGH (ref 0.44–1.00)
GFR, Estimated: 46 mL/min — ABNORMAL LOW (ref >60)
Glucose, Bld: 91 mg/dL (ref 70–99)
Potassium: 3.7 mmol/L (ref 3.5–5.1)
Sodium: 140 mmol/L (ref 135–145)
Total Bilirubin: 0.7 mg/dL (ref 0.0–1.2)
Total Protein: 6.8 g/dL (ref 6.5–8.1)

## 2023-12-12 LAB — CBC WITH DIFFERENTIAL (CANCER CENTER ONLY)
Abs Immature Granulocytes: 0.02 K/uL (ref 0.00–0.07)
Basophils Absolute: 0 K/uL (ref 0.0–0.1)
Basophils Relative: 1 %
Eosinophils Absolute: 0.1 K/uL (ref 0.0–0.5)
Eosinophils Relative: 2 %
HCT: 34.8 % — ABNORMAL LOW (ref 36.0–46.0)
Hemoglobin: 12 g/dL (ref 12.0–15.0)
Immature Granulocytes: 0 %
Lymphocytes Relative: 11 %
Lymphs Abs: 0.5 K/uL — ABNORMAL LOW (ref 0.7–4.0)
MCH: 36.6 pg — ABNORMAL HIGH (ref 26.0–34.0)
MCHC: 34.5 g/dL (ref 30.0–36.0)
MCV: 106.1 fL — ABNORMAL HIGH (ref 80.0–100.0)
Monocytes Absolute: 0.2 K/uL (ref 0.1–1.0)
Monocytes Relative: 5 %
Neutro Abs: 3.8 K/uL (ref 1.7–7.7)
Neutrophils Relative %: 81 %
Platelet Count: 240 K/uL (ref 150–400)
RBC: 3.28 MIL/uL — ABNORMAL LOW (ref 3.87–5.11)
RDW: 15.9 % — ABNORMAL HIGH (ref 11.5–15.5)
WBC Count: 4.6 K/uL (ref 4.0–10.5)
nRBC: 0 % (ref 0.0–0.2)

## 2023-12-12 MED ORDER — OXYCODONE HCL 10 MG PO TABS: 10.0000 mg | ORAL_TABLET | Freq: Four times a day (QID) | ORAL | 0 refills | Status: AC | PRN

## 2023-12-12 NOTE — Assessment & Plan Note (Signed)
 Right lumpectomy 02/03/2015: Invasive ductal carcinoma grade 2, 2.1 cm, with associated DCIS, margins are negative, 0/1 lymph node, ER 0%, PR 0%, HER-2 positive ratio 2.31, T2 N0 stage II a. (Right breast biopsy 3:30 position 12/09/2014: Invasive ductal carcinoma grade 3, ER 0%, PR 0%, HER-2 negative ratio 1.5, Ki-67 90%, 1 cm irregular mass T1C N0 stage IA clinical stage) Genetic counseling revealed NBN mutation   2. Adjuvant chemotherapy with TCH 6 cycles started 03/09/15- 06/22/15 took Herceptin  maintenance until 02/15/2016 3. Followed by radiation therapy 08/11/2015 to 09/22/2015 4. Cutaneous recurrence: 03/07/2017: Right mastectomy: IDC grade 3, 2 foci spanning 1.2 cm and 1.1 cm, lymphovascular invasion is present including dermal lymphatics, margins negative, 0/2 lymph nodes negative, ER 0%, PR 0%, HER-2 negative, Ki-67 not done,pT4b (skin involvement) N0 stage IIIc 5. Adjuvant radiation therapy 11/14/2017-12/29/2017 ----------------------------------------------------------------------------------------------- Second Chest wall Recurrence: 04/10/2023: [facility] dermatology Dr. 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  No evidence of distant metastases 05/23/2023: Soft tissue chest wall excision: Grade 3 IDC with skin involvement, margins negative, ER 0%, PR 0%, Ki67 70%, HER2 0 negative [facility] molecular testing: Triple negative, HER2 0, TMB 4, PD-L1 negative, no actionable mutations   Third Cutaneous recurrence: 07/17/23: Skin biopsy: Poorly Diff carcinoma ER: 5%, PR 0%, Ki 67: 80%, HER 2: 0  Dr. Vanderbilt do not feel that he could resect the tumor and close the chest wall.   Chest wall wound: Recent antibiotics and then antifungal treatments.  Patient thinks that the wound is slowly improving.   Current treatment: Xeloda  started 08/02/2023 Xeloda   toxicities: Fatigue Dryness of the hands Anemia: Monitoring closely Hand-foot syndrome: Since it improved, she has started back on Xeloda  at 1000 mg in a.m. and 500 mg in p.m.   CT CAP 10/29/2023: Interval increase in the ill-defined soft tissue infiltrating anterior right chest wall which is ulcerated with underlying bony and cartilaginous destruction.  Sclerosis inferior sternal body progression versus postsurgical change   Plan: PET/CT in 3 months If there is continued progression then we will consider switching her to [place] or Trodelvy.

## 2023-12-12 NOTE — Progress Notes (Signed)
 Specialty Pharmacy Refill Coordination Note  Shawna [name] is a 83 y.o. female contacted today regarding refills of specialty medication(s) Capecitabine  (XELODA )   Patient requested [name] at [facility] at [place] date: 12/15/23   Medication will be filled on: 12/13/23

## 2023-12-12 NOTE — Progress Notes (Signed)
 Patient Care Team: Onita Rush, MD as PCP - General (Internal Medicine) Ethyl Lenis, MD as Consulting Physician (General Surgery) Odean Potts, MD as Medical Oncologist (Hematology and Oncology) Crawford Morna Pickle, NP as Nurse Practitioner (Hematology and Oncology) Jannis Kate Norris, MD as Consulting Physician (Obstetrics and Gynecology) Tyree Nanetta SAILOR, RN as Oncology Nurse Navigator Lucila Rush LABOR, RPH-CPP as Pharmacist (Hematology and Oncology)  DIAGNOSIS:  Encounter Diagnosis  Name Primary?   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 Genes tested include:  ATM, BARD1, BRCA1, BRCA2, BRIP1, CDH1, CHEK2, EPCAM, FANCC, MLH1, MSH2, MSH6, NBN, PALB2, PMS2, PTEN, RAD51C, RAD51D, TP53, and XRCC2.   02/03/2015 Surgery   Right lumpectomy: Invasive ductal carcinoma grade 2, 2.1 cm, with associated DCIS, margins are negative, 0/1 lymph node, ER 0%, PR 0%, HER-2 positive ratio 2.31, T2 N0 stage II a   03/09/2015 - 06/22/2015 Chemotherapy   Adjuvant chemotherapy with TCH 6 followed by Herceptin  maintenance for 1 year   08/11/2015 - 09/23/2015 Radiation Therapy   Adjuvant radiation therapy Audry):  1) Right Breast / 50 Gy in 25 fractions ; 2) Right Breast Boost / 10 Gy in 5 fractions   01/30/2017 Relapse/Recurrence   Skin biopsy right breast: Metastatic breast cancer GCDFP strongly positive   02/15/2017 PET scan   Hypermetabolic focus of  skin thickening along the medial right breast/chest wall consistent with recurrent disease, no other hypermetabolic metastases identified.   03/07/2017 Surgery   Right mastectomy: IDC grade 3, 2 foci spanning 1.2 cm and 1.1 cm, lymphovascular invasion is present including dermal lymphatics, margins negative, 0/2 lymph nodes negative, ER 0%, PR 0%, HER-2 negative, Ki-67 not done,pT4b (skin involvement) N0 stage IIIc   10/12/2017 Surgery   Chest wall Excision: Breast cancer   11/14/2017 - 12/29/2017 Radiation Therapy   Adjuvant right chest wall XRT     CHIEF COMPLIANT: Follow-up to discuss her treatment plan  HISTORY OF PRESENT ILLNESS:  History of Present Illness Shawna [name] is an 83 year old female with metastatic cancer who presents with right-sided chest pain related to the fungating right chest wall mass and lymphedema.  She reports new right-sided chest pain separate from the wound. The pain is pleuritic with deep breaths, radiates from the front of her chest to her back, and has lessened since onset. She can lie on her right side and take deep breaths without pain. She has Tylenol  at home but has not used any pain medication.  She is on Xeloda , which was held temporarily and scheduled to restart today. She feels her condition is not worsening.  She reports worsening lymphedema of the arm with swelling now extending to the wrist. The swelling improves when she wears her compression sleeve, which she had not been using consistently.     ALLERGIES:  has no known allergies.  MEDICATIONS:  Current Outpatient Medications  Medication Sig Dispense Refill   acetaminophen  (TYLENOL ) 650 MG CR tablet Take 650 mg by  mouth every 8 (eight) hours as needed for pain.     Ascorbic Acid (VITAMIN C PO) Take by mouth.     Calcium  500-2.5 MG-MCG CHEW      capecitabine  (XELODA ) 500 MG tablet 2 tabs in AM and 1 tab in PM 14 days on and 7 days off 42 tablet 6   hydrochlorothiazide  (HYDRODIURIL )  12.5 MG tablet Take 12.5 mg by mouth in the morning.     losartan  (COZAAR ) 100 MG tablet Take 100 mg by mouth in the morning.  0   Multiple Vitamin (MULTIVITAMIN) tablet Take 1 tablet by mouth daily.     oxyCODONE  10 MG TABS Take 1 tablet (10 mg total) by mouth every 6 (six) hours as needed for severe pain (pain score 7-10). 60 tablet 0   Polyethyl Glyc-Propyl Glyc PF (SYSTANE PRESERVATIVE FREE) 0.4-0.3 % SOLN Place 1-2 drops into both eyes 3 (three) times daily as needed (dry/irritated eyes).     rosuvastatin  (CRESTOR ) 20 MG tablet Take 20 mg by mouth daily.     Vibegron (GEMTESA) 75 MG TABS 1 tablet Orally Once a day     zinc gluconate 50 MG tablet Take 50 mg by mouth daily.     No current facility-administered medications for this visit.    PHYSICAL EXAMINATION: ECOG PERFORMANCE STATUS: 1 - Symptomatic but completely ambulatory  Vitals:   12/12/23 0821  BP: 130/80  Pulse: 75  Resp: 18  Temp: 97.7 F (36.5 C)  SpO2: 100%   Filed Weights   12/12/23 0821  Weight: 138 lb 4.8 oz (62.7 kg)      LABORATORY DATA:  I have reviewed the data as listed    Latest Ref Rng & Units 10/26/2023    1:08 PM 09/26/2023   11:05 AM 08/31/2023   12:14 PM  CMP  Glucose 70 - 99 mg/dL 892  97  888   BUN 8 - 23 mg/dL 28  25  23    Creatinine 0.44 - 1.00 mg/dL 8.75  8.85  8.83   Sodium 135 - 145 mmol/L 140  138  136   Potassium 3.5 - 5.1 mmol/L 3.6  4.0  3.7   Chloride 98 - 111 mmol/L 105  104  102   CO2 22 - 32 mmol/L 28  27  26    Calcium  8.9 - 10.3 mg/dL 89.9  9.4  9.0   Total Protein 6.5 - 8.1 g/dL 7.2  6.7  6.7   Total Bilirubin 0.0 - 1.2 mg/dL 0.8  0.9  0.7   Alkaline Phos 38 - 126 U/L 90  86  85   AST 15 - 41 U/L 18  37  18   ALT 0 - 44 U/L 15  39  13     Lab Results  Component Value Date   WBC 4.6 12/12/2023   HGB 12.0 12/12/2023   HCT 34.8 (L) 12/12/2023   MCV 106.1 (H) 12/12/2023   PLT 240 12/12/2023   NEUTROABS 3.8 12/12/2023    ASSESSMENT & PLAN:  Breast cancer of  lower-inner quadrant of right female breast (HCC) Right lumpectomy 02/03/2015: Invasive ductal carcinoma grade 2, 2.1 cm, with associated DCIS, margins are negative, 0/1 lymph node, ER 0%, PR 0%, HER-2 positive ratio 2.31, T2 N0 stage II a. (Right breast biopsy 3:30 position 12/09/2014: Invasive ductal carcinoma grade 3, ER 0%, PR 0%, HER-2 negative ratio 1.5, Ki-67 90%, 1 cm irregular mass T1C N0 stage IA clinical stage) Genetic counseling revealed NBN mutation  2. Adjuvant chemotherapy with TCH 6 cycles started 03/09/15- 06/22/15 took Herceptin  maintenance until 02/15/2016 3. Followed by radiation therapy 08/11/2015 to 09/22/2015 4. Cutaneous recurrence: 03/07/2017: Right mastectomy: IDC grade 3, 2 foci spanning 1.2 cm and 1.1 cm, lymphovascular invasion is present including dermal lymphatics, margins negative, 0/2 lymph nodes negative, ER 0%, PR 0%, HER-2 negative, Ki-67 not done,pT4b (skin involvement) N0 stage IIIc 5. Adjuvant radiation therapy 11/14/2017-12/29/2017 ----------------------------------------------------------------------------------------------- Second Chest wall Recurrence: 04/10/2023: [facility] dermatology Dr. 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  No evidence of distant metastases 05/23/2023: Soft tissue chest wall excision: Grade 3 IDC with skin involvement, margins negative, ER 0%, PR 0%, Ki67 70%, HER2 0 negative [facility] molecular testing: Triple negative, HER2 0, TMB 4, PD-L1 negative, no actionable mutations   Third Cutaneous recurrence: 07/17/23: Skin biopsy: Poorly Diff carcinoma ER: 5%, PR 0%, Ki 67: 80%, HER 2: 0  Dr. Vanderbilt do not feel that he could resect the tumor and close the chest wall.   Chest wall wound: Recent antibiotics and then antifungal treatments.  Patient thinks that the wound is slowly improving.   Current  treatment: Xeloda  started 08/02/2023 Xeloda  toxicities: Fatigue Dryness of the hands Anemia: Monitoring closely Hand-foot syndrome: Since it improved, she has started back on Xeloda  at 1000 mg in a.m. and 500 mg in p.m.   CT CAP 10/29/2023: Interval increase in the ill-defined soft tissue infiltrating anterior right chest wall which is ulcerated with underlying bony and cartilaginous destruction.  Sclerosis inferior sternal body progression versus postsurgical change   Plan: PET/CT January 2026 If there is continued progression then we will consider switching her to Enhertu  ------------------------------------- Assessment and Plan Assessment & Plan Metastatic right breast cancer (lower-inner quadrant) Metastatic breast cancer with rib involvement causing pain. Xeloda  treatment ongoing. Enhertu considered for potential response despite initial wound worsening. Not a long-term solution. - Continue Xeloda  for two more rounds. - Scheduled PET scan for January 9th. - Consider Enhertu as the next treatment option. - Referred to plastic surgeon for potential surgical intervention.  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 No current pain management. - Prescribed oxycodone  10 mg every six hours as needed, 60 tablets provided. - Offered referral to palliative care nurse for pain management adjustment. - Consider long-term pain relief patch if oxycodone  insufficient.  Right upper limb lymphedema Swelling to wrist, worsened by lack of compression garments. - Advised wearing compression sleeve.      Orders Placed This Encounter  Procedures   CBC with Differential (Cancer Center Only)    Standing Status:   Future    Number of Occurrences:   1    Expected Date:   12/12/2023    Expiration Date:   12/11/2024   CMP (Cancer Center only)    Standing Status:   Future    Number of Occurrences:   1    Expected Date:   12/12/2023     Expiration Date:   12/11/2024   CA 27.29    Standing Status:   Future    Number of Occurrences:   1    Expected Date:   12/12/2023    Expiration Date:   12/11/2024   The patient has a good understanding of the overall plan. she agrees with it. she will call with any problems that may develop before the next  visit here.  I personally spent a total of 30 minutes in the care of the patient today including preparing to see the patient, getting/reviewing separately obtained history, performing a medically appropriate exam/evaluation, counseling and educating, placing orders, referring and communicating with other health care professionals, documenting clinical information in the EHR, independently interpreting results, communicating results, and coordinating care.   Viinay K Ariba Lehnen, MD 12/12/23

## 2023-12-13 ENCOUNTER — Ambulatory Visit: Admitting: Plastic Surgery

## 2023-12-13 ENCOUNTER — Encounter: Payer: Self-pay | Admitting: Plastic Surgery

## 2023-12-13 VITALS — BP 134/72 | HR 73 | Ht 68.0 in | Wt 139.0 lb

## 2023-12-13 DIAGNOSIS — Z171 Estrogen receptor negative status [ER-]: Secondary | ICD-10-CM | POA: Diagnosis not present

## 2023-12-13 DIAGNOSIS — Z923 Personal history of irradiation: Secondary | ICD-10-CM | POA: Diagnosis not present

## 2023-12-13 DIAGNOSIS — S21101D Unspecified open wound of right front wall of thorax without penetration into thoracic cavity, subsequent encounter: Secondary | ICD-10-CM | POA: Diagnosis not present

## 2023-12-13 DIAGNOSIS — S2190XA Unspecified open wound of unspecified part of thorax, initial encounter: Secondary | ICD-10-CM

## 2023-12-13 DIAGNOSIS — Z853 Personal history of malignant neoplasm of breast: Secondary | ICD-10-CM | POA: Diagnosis not present

## 2023-12-13 LAB — CANCER ANTIGEN 27.29: CA 27.29: 51.8 U/mL — ABNORMAL HIGH (ref 0.0–38.6)

## 2023-12-13 NOTE — Progress Notes (Signed)
 Referring Provider Onita Rush, MD [address] [place],  [place] [address]   CC:  Chief Complaint  Patient presents with   Advice Only      Shawna [name] is an 83 y.o. female.  HPI: Shawna [name] presents today for evaluation of a right chest wall wound.  She has had a long and difficult history with right breast cancer.  She has undergone excision with radiation therapy followed by recurrence and second round of radiation therapy in 2017 and 2019 respectively.  This year she was noted to have a skin recurrence.  She has a small wound which was first documented in her photographs in April of this year with a much larger wound by May.  She now has a wound approximately 5 x 5 cm in the medial portion of the right chest.  There is exposed bone some granulation tissue and what appears to be recurrent cancer within the wound.  Her primary goal is coverage of the wound.  She does endorse pain which fluctuates in severity.  Most of this is somewhat pleuritic with pain when she takes a deep breath however she does have pain when abducting her right arm.   No Known Allergies  Outpatient Encounter Medications as of 12/13/2023  Medication Sig   acetaminophen  (TYLENOL ) 650 MG CR tablet Take 650 mg by mouth every 8 (eight) hours as needed for pain.   Ascorbic Acid (VITAMIN C PO) Take by mouth.   Calcium  500-2.5 MG-MCG CHEW    capecitabine  (XELODA ) 500 MG tablet 2 tabs in AM and 1 tab in PM 14 days on and 7 days off   hydrochlorothiazide  (HYDRODIURIL ) 12.5 MG tablet Take 12.5 mg by mouth in the morning.   losartan  (COZAAR ) 100 MG tablet Take 100 mg by mouth in the morning.   Multiple Vitamin (MULTIVITAMIN) tablet Take 1 tablet by mouth daily.   oxyCODONE  10 MG TABS Take 1 tablet (10 mg total) by mouth every 6 (six) hours as needed for severe pain (pain score 7-10).   Polyethyl Glyc-Propyl Glyc PF (SYSTANE PRESERVATIVE FREE) 0.4-0.3 % SOLN Place 1-2 drops into both eyes 3 (three) times daily as  needed (dry/irritated eyes).   rosuvastatin  (CRESTOR ) 20 MG tablet Take 20 mg by mouth daily.   Vibegron (GEMTESA) 75 MG TABS 1 tablet Orally Once a day   zinc gluconate 50 MG tablet Take 50 mg by mouth daily.   No facility-administered encounter medications on file as of 12/13/2023.     Past Medical History:  Diagnosis Date   Arthritis    Breast cancer (HCC) 11/2014   Family history of breast cancer    Family history of colon cancer    History of kidney stones    History of radiation therapy 08/11/15- 09/23/2015   Right Breast   History of radiation therapy 11/13/17- 12/29/17   Right chest wall and IM nodes 50.04 Gy in 28 fractions, Right supraclavicular and PAB nodes 50.04 Gy in 28 fractions, Right Chest wall boost/ 10 Gy in 5 fractions.    Hyperlipidemia    Hypertension    Monoallelic mutation of NBN gene    Osteoporosis    Pessary maintenance    Pneumonia    Skin cancer of nose     Past Surgical History:  Procedure Laterality Date   APPENDECTOMY     bilateral cataract surgery      BREAST LUMPECTOMY WITH RADIOACTIVE SEED AND SENTINEL LYMPH NODE BIOPSY Right 02/03/2015   Procedure: RIGHT BREAST  LUMPECTOMY WITH RADIOACTIVE SEED AND RIGHT SENTINEL LYMPH NODE BIOPSY;  Surgeon: Alm Angle, MD;  Location: [facility];  Service: General;  Laterality: Right;   BREAST SURGERY  11/2012   biopsy, benign breast tissue   COLONOSCOPY  06/2010   rec   LITHOTRIPSY     kidney stone on left side   MASS EXCISION Right 05/23/2023   Procedure: EXCISION, MASS, CHEST WALL;  Surgeon: Vanderbilt Ned, MD;  Location: [facility];  Service: General;  Laterality: Right;  WIDE EXCISION RIGHT CHEST WALL BREAST CANCER   MASTECTOMY W/ SENTINEL NODE BIOPSY Right 03/07/2017   Procedure: RIGHT TOTAL MASTECTOMY WITH RIGHT AXILLARY SENTINEL LYMPH NODE BIOPSY;  Surgeon: Angle Alm, MD;  Location: [facility];  Service: General;  Laterality: Right;   OVARIAN CYST REMOVAL  age 81    POLYPECTOMY  04/2005   colon   PORTACATH PLACEMENT Left 02/03/2015   Procedure: INSERTION PORT-A-CATH WITH ULTRA SOUND GUIDANCE;  Surgeon: Alm Angle, MD;  Location: [facility];  Service: General;  Laterality: Left;   portacath removal      reexcision  10/09/2017   Dr. Angle, chest wall excision for recurrent breast cancer.    TOTAL KNEE ARTHROPLASTY Right 05/04/2021   Procedure: TOTAL KNEE ARTHROPLASTY;  Surgeon: Ernie Cough, MD;  Location: [facility];  Service: Orthopedics;  Laterality: Right;    Family History  Problem Relation Age of Onset   Colon cancer Sister 34       died at 11   Breast cancer Mother 85   Hypertension Mother    Osteoporosis Mother    Heart disease Father        CABG   Dementia Father    Throat cancer Brother 65   Breast cancer Sister 30   Lymphoma Sister 9   Melanoma Sister    Skin cancer Brother    Skin cancer Daughter     Social History   Social History Narrative   Not on file     Review of Systems General: Denies fevers, chills, weight loss CV: Denies chest pain, shortness of breath, palpitations Right chest: Open wound with long history of breast cancer, recurrent  Physical Exam    12/13/2023   11:23 AM 12/12/2023    8:21 AM 11/02/2023   10:44 AM  Vitals with BMI  Height 5' 8 5' 8.75   Weight 139 lbs 138 lbs 5 oz 137 lbs  BMI 21.14 20.58   Systolic 134 130 878  Diastolic 72 80 60  Pulse 73 75 77    General:  No acute distress,  Alert and oriented, Non-Toxic, Normal speech and affect Right chest: 5 x 5 cm wound with exposed bone in the medial portion of the right chest. Mammogram: Not applicable Assessment/Plan Right chest wound: I had a long discussion with Shawna [name] regarding treatment.  I would like to try the most simple options first.  This would include use of a tissue replacement matrix to try to obtain at least some solid granulation base within the wound.  This will not be possible with the devitalized  bone in the center.  She is scheduled to see cardiothoracic surgery regarding the bone as well.  We did briefly discuss pedicled flaps for resurfacing of the wound.  Generally this is not done in the face of active cancer however in her situation I am not sure that there are many other options.  Will have her return after she has seen cardiothoracic  surgery and obtain their opinion.  Will proceed cautiously with any type of tissue rearrangement for her wound.  Follow-up with me 5 days after CT surgery appointment  Shawna [name] 12/13/2023, 1:43 PM

## 2023-12-19 DIAGNOSIS — Z9011 Acquired absence of right breast and nipple: Secondary | ICD-10-CM | POA: Diagnosis not present

## 2023-12-19 DIAGNOSIS — Z483 Aftercare following surgery for neoplasm: Secondary | ICD-10-CM | POA: Diagnosis not present

## 2023-12-19 DIAGNOSIS — I890 Lymphedema, not elsewhere classified: Secondary | ICD-10-CM | POA: Diagnosis not present

## 2023-12-19 DIAGNOSIS — I10 Essential (primary) hypertension: Secondary | ICD-10-CM | POA: Diagnosis not present

## 2023-12-19 DIAGNOSIS — E7800 Pure hypercholesterolemia, unspecified: Secondary | ICD-10-CM | POA: Diagnosis not present

## 2023-12-19 DIAGNOSIS — H539 Unspecified visual disturbance: Secondary | ICD-10-CM | POA: Diagnosis not present

## 2023-12-19 DIAGNOSIS — Z96651 Presence of right artificial knee joint: Secondary | ICD-10-CM | POA: Diagnosis not present

## 2023-12-19 DIAGNOSIS — Z85828 Personal history of other malignant neoplasm of skin: Secondary | ICD-10-CM | POA: Diagnosis not present

## 2023-12-19 DIAGNOSIS — Z79899 Other long term (current) drug therapy: Secondary | ICD-10-CM | POA: Diagnosis not present

## 2023-12-19 DIAGNOSIS — C7989 Secondary malignant neoplasm of other specified sites: Secondary | ICD-10-CM | POA: Diagnosis not present

## 2023-12-19 DIAGNOSIS — C50311 Malignant neoplasm of lower-inner quadrant of right female breast: Secondary | ICD-10-CM | POA: Diagnosis not present

## 2023-12-19 DIAGNOSIS — Z9181 History of falling: Secondary | ICD-10-CM | POA: Diagnosis not present

## 2023-12-19 DIAGNOSIS — D630 Anemia in neoplastic disease: Secondary | ICD-10-CM | POA: Diagnosis not present

## 2023-12-19 DIAGNOSIS — M1712 Unilateral primary osteoarthritis, left knee: Secondary | ICD-10-CM | POA: Diagnosis not present

## 2023-12-22 ENCOUNTER — Other Ambulatory Visit (HOSPITAL_COMMUNITY): Payer: Self-pay

## 2023-12-22 ENCOUNTER — Telehealth: Payer: Self-pay

## 2023-12-22 NOTE — Telephone Encounter (Signed)
 Oral Oncology Patient Advocate Encounter  After completing a benefits investigation, prior authorization for Oxycodone  is not required at this time through [facility].  I contacted the insurance company, they stated a PA is not needed but a DUR Opioid Naive  (4J PPS Code) was required. I called [facility]  to inform them of this, they now have a paid claim and will  have ready for the patient today.  Patient has been notified via MyChart  Charlott Hamilton,  CPhT-Adv  she/her/hers [facility]  [facility] Pharmacy Technician Patient Advocate Specialist III [facility] Phone: [phone]  Fax: [phone] [email or web address]

## 2023-12-26 DIAGNOSIS — E7849 Other hyperlipidemia: Secondary | ICD-10-CM | POA: Diagnosis not present

## 2023-12-26 DIAGNOSIS — D649 Anemia, unspecified: Secondary | ICD-10-CM | POA: Diagnosis not present

## 2023-12-28 ENCOUNTER — Other Ambulatory Visit: Payer: Self-pay

## 2023-12-29 ENCOUNTER — Other Ambulatory Visit: Payer: Self-pay

## 2024-01-01 ENCOUNTER — Other Ambulatory Visit: Payer: Self-pay

## 2024-01-01 ENCOUNTER — Other Ambulatory Visit (HOSPITAL_COMMUNITY): Payer: Self-pay

## 2024-01-01 NOTE — Progress Notes (Signed)
 Specialty Pharmacy Refill Coordination Note  Shawna [name] is a 83 y.o. female contacted today regarding refills of specialty medication(s) Capecitabine  (XELODA )   Patient requested [name] at [facility] [place] date: 01/04/24   Medication will be filled on: 01/03/24

## 2024-01-02 DIAGNOSIS — R82998 Other abnormal findings in urine: Secondary | ICD-10-CM | POA: Diagnosis not present

## 2024-01-02 DIAGNOSIS — I10 Essential (primary) hypertension: Secondary | ICD-10-CM | POA: Diagnosis not present

## 2024-01-03 ENCOUNTER — Other Ambulatory Visit: Payer: Self-pay

## 2024-01-16 ENCOUNTER — Ambulatory Visit
Attending: Thoracic Surgery (Cardiothoracic Vascular Surgery) | Admitting: Thoracic Surgery (Cardiothoracic Vascular Surgery)

## 2024-01-16 VITALS — BP 140/78 | HR 80 | Resp 20 | Ht 68.0 in | Wt 140.0 lb

## 2024-01-16 DIAGNOSIS — C761 Malignant neoplasm of thorax: Secondary | ICD-10-CM | POA: Diagnosis not present

## 2024-01-16 NOTE — Progress Notes (Signed)
 PCP is Shawna Rush, MD Referring Provider is Shawna Ned, MD  Chief Complaint  Patient presents with   chest wall cancer    Surgical consult    HPI: [name] is sent for consultation regarding an open wound of the chest wall.  Shawna [name] is an 83 year old woman with a past medical history significant for hypertension, hyperlipidemia, osteoporosis, arthritis, skin cancer and right breast cancer.  First diagnosed with breast cancer in 2016.  She had a lumpectomy and axillary radiation.  In 2019 she had a recurrence and underwent mastectomy, additional radiation and then underwent adjuvant chemotherapy.  In the spring 2025 she developed skin/chest wall recurrence.  Dr. Vanderbilt did a wide local excision measuring approximately 5 x 5 cm.  Margins were negative but very close.  He closed a portion of this wound but there was an area where the skin could not be reapproximated.  Treated with local wound care, but has failed to heal.  Now with exposed cartilage and possible recurrence.  She does have some pain in the area of the wound.  No fevers chills or sweats.   Past Medical History:  Diagnosis Date   Arthritis    Breast cancer (HCC) 11/2014   Family history of breast cancer    Family history of colon cancer    History of kidney stones    History of radiation therapy 08/11/15- 09/23/2015   Right Breast   History of radiation therapy 11/13/17- 12/29/17   Right chest wall and IM nodes 50.04 Gy in 28 fractions, Right supraclavicular and PAB nodes 50.04 Gy in 28 fractions, Right Chest wall boost/ 10 Gy in 5 fractions.    Hyperlipidemia    Hypertension    Monoallelic mutation of NBN gene    Osteoporosis    Pessary maintenance    Pneumonia    Skin cancer of nose     Past Surgical History:  Procedure Laterality Date   APPENDECTOMY     bilateral cataract surgery      BREAST LUMPECTOMY WITH RADIOACTIVE SEED AND SENTINEL LYMPH NODE BIOPSY Right 02/03/2015   Procedure: RIGHT  BREAST LUMPECTOMY WITH RADIOACTIVE SEED AND RIGHT SENTINEL LYMPH NODE BIOPSY;  Surgeon: Shawna Angle, MD;  Location: [facility];  Service: General;  Laterality: Right;   BREAST SURGERY  11/2012   biopsy, benign breast tissue   COLONOSCOPY  06/2010   rec   LITHOTRIPSY     kidney stone on left side   MASS EXCISION Right 05/23/2023   Procedure: EXCISION, MASS, CHEST WALL;  Surgeon: Shawna Ned, MD;  Location: [facility];  Service: General;  Laterality: Right;  WIDE EXCISION RIGHT CHEST WALL BREAST CANCER   MASTECTOMY W/ SENTINEL NODE BIOPSY Right 03/07/2017   Procedure: RIGHT TOTAL MASTECTOMY WITH RIGHT AXILLARY SENTINEL LYMPH NODE BIOPSY;  Surgeon: [name] Alm, MD;  Location: [facility] OR;  Service: General;  Laterality: Right;   OVARIAN CYST REMOVAL  age 74   POLYPECTOMY  04/2005   colon   PORTACATH PLACEMENT Left 02/03/2015   Procedure: INSERTION PORT-A-CATH WITH ULTRA SOUND GUIDANCE;  Surgeon: Shawna Angle, MD;  Location: [facility];  Service: General;  Laterality: Left;   portacath removal      reexcision  10/09/2017   Dr. Angle, chest wall excision for recurrent breast cancer.    TOTAL KNEE ARTHROPLASTY Right 05/04/2021   Procedure: TOTAL KNEE ARTHROPLASTY;  Surgeon: Shawna Cough, MD;  Location: [facility];  Service: Orthopedics;  Laterality: Right;  Family History  Problem Relation Age of Onset   Colon cancer Sister 65       died at 2   Breast cancer Mother 81   Hypertension Mother    Osteoporosis Mother    Heart disease Father        CABG   Dementia Father    Throat cancer Brother 30   Breast cancer Sister 68   Lymphoma Sister 76   Melanoma Sister    Skin cancer Brother    Skin cancer Daughter     Social History Social History[1]  Current Outpatient Medications  Medication Sig Dispense Refill   acetaminophen  (TYLENOL ) 650 MG CR tablet Take 650 mg by mouth every 8 (eight) hours as needed for pain.     Ascorbic Acid (VITAMIN  C PO) Take by mouth.     Calcium  500-2.5 MG-MCG CHEW      capecitabine  (XELODA ) 500 MG tablet 2 tabs in AM and 1 tab in PM 14 days on and 7 days off 42 tablet 6   hydrochlorothiazide  (HYDRODIURIL ) 12.5 MG tablet Take 12.5 mg by mouth in the morning.     losartan  (COZAAR ) 100 MG tablet Take 100 mg by mouth in the morning.  0   Multiple Vitamin (MULTIVITAMIN) tablet Take 1 tablet by mouth daily.     oxyCODONE  10 MG TABS Take 1 tablet (10 mg total) by mouth every 6 (six) hours as needed for severe pain (pain score 7-10). 60 tablet 0   Polyethyl Glyc-Propyl Glyc PF (SYSTANE PRESERVATIVE FREE) 0.4-0.3 % SOLN Place 1-2 drops into both eyes 3 (three) times daily as needed (dry/irritated eyes).     rosuvastatin  (CRESTOR ) 20 MG tablet Take 20 mg by mouth daily.     Vibegron (GEMTESA) 75 MG TABS 1 tablet Orally Once a day     zinc gluconate 50 MG tablet Take 50 mg by mouth daily.     No current facility-administered medications for this visit.    Allergies[2]  Review of Systems  Constitutional:  Negative for activity change, appetite change and unexpected weight change.  HENT:  Positive for hearing loss.   Respiratory:  Positive for shortness of breath.   Cardiovascular:  Negative for chest pain.  Musculoskeletal:  Positive for arthralgias.  Skin:        Open wound right chest  Neurological:  Positive for numbness.  All other systems reviewed and are negative.   BP (!) 140/78   Pulse 80   Resp 20   Ht 5' 8 (1.727 m)   Wt 140 lb (63.5 kg)   LMP 01/17/1982   SpO2 98%   BMI 21.29 kg/m  Physical Exam Vitals reviewed.  Constitutional:      General: She is not in acute distress.    Appearance: Normal appearance.  HENT:     Head: Normocephalic and atraumatic.  Eyes:     General: No scleral icterus.    Extraocular Movements: Extraocular movements intact.  Cardiovascular:     Rate and Rhythm: Normal rate and regular rhythm.     Heart sounds: Normal heart sounds. No murmur  heard. Pulmonary:     Effort: Pulmonary effort is normal. No respiratory distress.     Breath sounds: Normal breath sounds. No wheezing.  Abdominal:     General: There is no distension.     Palpations: Abdomen is soft.  Lymphadenopathy:     Cervical: No cervical adenopathy.  Skin:    Comments: Open wound approximately 6 x 6  cm with exposed cartilage right anterior chest wall some granulation tissue and exudate  Neurological:     General: No focal deficit present.     Mental Status: She is alert and oriented to person, place, and time.     Cranial Nerves: No cranial nerve deficit.     Motor: No weakness.     Diagnostic Tests: CT ANGIOGRAPHY CHEST WITH CONTRAST   TECHNIQUE: Multidetector CT imaging of the chest was performed using the standard protocol during bolus administration of intravenous contrast. Multiplanar CT image reconstructions and MIPs were obtained to evaluate the vascular anatomy.   RADIATION DOSE REDUCTION: This exam was performed according to the departmental dose-optimization program which includes automated exposure control, adjustment of the mA and/or kV according to patient size and/or use of iterative reconstruction technique.   CONTRAST:  60mL OMNIPAQUE  IOHEXOL  350 MG/ML SOLN   COMPARISON:  10/26/2023   FINDINGS: Cardiovascular: Borderline cardiomegaly. Thoracic aorta is normal in caliber. Calcified plaque over the descending thoracic aorta. Pulmonary arterial system is well opacified without evidence of emboli. Remaining vascular structures are unremarkable.   Mediastinum/Nodes: No mediastinal or hilar adenopathy. Remaining mediastinal structures are unremarkable.   Lungs/Pleura: Lungs are adequately inflated. No acute airspace process or effusion. Stable radiation fibrosis over the right upper lobe. Minimal scarring over the left lower lobe and lingula. Linear atelectasis adjacent the right anterior mid lung. Airways are normal.   Upper  Abdomen: Calcified plaque over the abdominal aorta. Cystic changes of the kidneys which is stable. No acute findings in the upper abdomen.   Musculoskeletal: Previous right mastectomy. Continued moderate soft tissue defect/ulceration over the right anterior chest wall just lateral to the midline. Patient had hypermetabolic lymph node in this region April 2025 with subsequent surgery May 2025. Findings likely represent patient's postoperative wound as this is not significantly changed. Heterogeneous sclerosis of the adjacent inferior aspect of the sternal body unchanged.   Review of the MIP images confirms the above findings.   IMPRESSION: 1. No acute cardiopulmonary disease and no evidence of pulmonary embolism. 2. Stable radiation fibrosis over the right upper lobe. 3. Previous right mastectomy. Continued moderate soft tissue defect/ulceration over the right anterior chest wall just lateral to the midline. Patient had hypermetabolic lymph node in this region April 2025 with subsequent surgery May 2025. Findings likely represent patient's postoperative wound as this is not significantly changed from 10/26/2023. Heterogeneous sclerosis of the adjacent inferior aspect of the sternal body unchanged as metastatic disease is possible. 4. Aortic atherosclerosis.   Aortic Atherosclerosis (ICD10-I70.0).     Electronically Signed   By: Toribio Agreste M.D.   On: 12/06/2023 18:12 I personally reviewed the CT images there is a skin/soft tissue defect right anterior chest wall with destruction of underlying cartilage.  Impression: Shawna [name] is an 83 year old woman with a past medical history significant for hypertension, hyperlipidemia, osteoporosis, arthritis, skin cancer and right breast cancer.  Underwent wide local excision for breast cancer recurrence of the right anterior chest wall back in May.  Wound has never healed.  Now possible areas of recurrent tumor.  Open wound right  chest-there is exposed costal cartilage at 2 levels.  Wound measures about 6 x 6 cm.  There is some granulation tissue.  Also some areas that potentially could be tumor recurrence.  Surrounding skin has significant radiation changes.  She is scheduled to have a PET scan next week.  Would like to see the results of that before making any final decisions as  to the chest wall.  She is supposed to follow-up with Dr. Waddell in the near future.  Most definitive treatment would be wide chest wall resection with a flap closure.  There is no guarantee we would be able to get a negative margin and would have quite a large defect.  Would probably require prosthetic chest wall reconstruction and coverage with a muscle flap or myocutaneous flap.  I do not think the skin will close primarily.  That would be a major undertaking in a lady of her age.  Dr. Waddell did mention possibly using a tissue replacement matrix.  Unfortunately that is not going to take over denuded necrotic cartilage.  We could try more limited cartilage resection and then coverage with that tissue replacement matrix or skin graft.  That might be her best option.  Will need to discuss with Dr. Taylor, Gudena, and Cornett wants PET/CT results available.  Plan: Follow-up with other physicians as scheduled Return in 3 weeks to discuss results of PET/CT  Elspeth JAYSON Millers, MD [facility] [phone]     [1]  Social History Tobacco Use   Smoking status: Never   Smokeless tobacco: Never  Vaping Use   Vaping status: Never Used  Substance Use Topics   Alcohol  use: Never   Drug use: No  [2] No Known Allergies

## 2024-01-17 ENCOUNTER — Other Ambulatory Visit: Payer: Self-pay

## 2024-01-19 ENCOUNTER — Other Ambulatory Visit: Payer: Self-pay

## 2024-01-23 ENCOUNTER — Other Ambulatory Visit (HOSPITAL_COMMUNITY): Payer: Self-pay

## 2024-01-23 NOTE — Progress Notes (Signed)
 Specialty Pharmacy Refill Coordination Note  Shawna [name] is a 84 y.o. female contacted today regarding refills of specialty medication(s) Capecitabine  (XELODA )   Patient requested [name] at [facility] at [place] date: 01/26/24   Medication will be filled on: 01/25/24

## 2024-01-25 ENCOUNTER — Other Ambulatory Visit: Payer: Self-pay

## 2024-01-26 ENCOUNTER — Inpatient Hospital Stay

## 2024-01-26 ENCOUNTER — Inpatient Hospital Stay: Attending: Hematology and Oncology

## 2024-01-26 ENCOUNTER — Encounter (HOSPITAL_COMMUNITY)
Admission: RE | Admit: 2024-01-26 | Discharge: 2024-01-26 | Disposition: A | Discharge status: Home / Self Care | Source: Ambulatory Visit | Attending: Hematology and Oncology | Admitting: Hematology and Oncology

## 2024-01-26 DIAGNOSIS — Z171 Estrogen receptor negative status [ER-]: Secondary | ICD-10-CM | POA: Insufficient documentation

## 2024-01-26 DIAGNOSIS — Z5112 Encounter for antineoplastic immunotherapy: Secondary | ICD-10-CM | POA: Diagnosis present

## 2024-01-26 DIAGNOSIS — Z1722 Progesterone receptor negative status: Secondary | ICD-10-CM | POA: Insufficient documentation

## 2024-01-26 DIAGNOSIS — Z9221 Personal history of antineoplastic chemotherapy: Secondary | ICD-10-CM | POA: Diagnosis not present

## 2024-01-26 DIAGNOSIS — C50311 Malignant neoplasm of lower-inner quadrant of right female breast: Secondary | ICD-10-CM | POA: Insufficient documentation

## 2024-01-26 DIAGNOSIS — C7951 Secondary malignant neoplasm of bone: Secondary | ICD-10-CM | POA: Insufficient documentation

## 2024-01-26 DIAGNOSIS — Z9011 Acquired absence of right breast and nipple: Secondary | ICD-10-CM | POA: Diagnosis not present

## 2024-01-26 DIAGNOSIS — Z923 Personal history of irradiation: Secondary | ICD-10-CM | POA: Insufficient documentation

## 2024-01-26 LAB — CMP (CANCER CENTER ONLY)
ALT: 18 U/L (ref 0–44)
AST: 25 U/L (ref 15–41)
Albumin: 4 g/dL (ref 3.5–5.0)
Alkaline Phosphatase: 92 U/L (ref 38–126)
Anion gap: 10 (ref 5–15)
BUN: 25 mg/dL — ABNORMAL HIGH (ref 8–23)
CO2: 25 mmol/L (ref 22–32)
Calcium: 9.7 mg/dL (ref 8.9–10.3)
Chloride: 105 mmol/L (ref 98–111)
Creatinine: 1.14 mg/dL — ABNORMAL HIGH (ref 0.44–1.00)
GFR, Estimated: 48 mL/min — ABNORMAL LOW
Glucose, Bld: 100 mg/dL — ABNORMAL HIGH (ref 70–99)
Potassium: 4.6 mmol/L (ref 3.5–5.1)
Sodium: 140 mmol/L (ref 135–145)
Total Bilirubin: 0.7 mg/dL (ref 0.0–1.2)
Total Protein: 6.9 g/dL (ref 6.5–8.1)

## 2024-01-26 LAB — CBC WITH DIFFERENTIAL (CANCER CENTER ONLY)
Abs Immature Granulocytes: 0.01 K/uL (ref 0.00–0.07)
Basophils Absolute: 0 K/uL (ref 0.0–0.1)
Basophils Relative: 1 %
Eosinophils Absolute: 0.1 K/uL (ref 0.0–0.5)
Eosinophils Relative: 2 %
HCT: 37.9 % (ref 36.0–46.0)
Hemoglobin: 13 g/dL (ref 12.0–15.0)
Immature Granulocytes: 0 %
Lymphocytes Relative: 11 %
Lymphs Abs: 0.5 K/uL — ABNORMAL LOW (ref 0.7–4.0)
MCH: 36.2 pg — ABNORMAL HIGH (ref 26.0–34.0)
MCHC: 34.3 g/dL (ref 30.0–36.0)
MCV: 105.6 fL — ABNORMAL HIGH (ref 80.0–100.0)
Monocytes Absolute: 0.2 K/uL (ref 0.1–1.0)
Monocytes Relative: 4 %
Neutro Abs: 3.9 K/uL (ref 1.7–7.7)
Neutrophils Relative %: 82 %
Platelet Count: 220 K/uL (ref 150–400)
RBC: 3.59 MIL/uL — ABNORMAL LOW (ref 3.87–5.11)
RDW: 14.7 % (ref 11.5–15.5)
WBC Count: 4.8 K/uL (ref 4.0–10.5)
nRBC: 0 % (ref 0.0–0.2)

## 2024-01-26 LAB — GLUCOSE, CAPILLARY: Glucose-Capillary: 95 mg/dL (ref 70–99)

## 2024-01-26 MED ORDER — FLUDEOXYGLUCOSE F - 18 (FDG) INJECTION: 6.9000 | Freq: Once | INTRAVENOUS | Status: DC | PRN

## 2024-01-27 LAB — CANCER ANTIGEN 27.29: CA 27.29: 44.5 U/mL — ABNORMAL HIGH (ref 0.0–38.6)

## 2024-01-29 ENCOUNTER — Ambulatory Visit: Admitting: Obstetrics and Gynecology

## 2024-01-31 ENCOUNTER — Inpatient Hospital Stay: Admitting: Hematology and Oncology

## 2024-01-31 VITALS — BP 133/71 | HR 75 | Temp 97.4°F | Resp 18 | Ht 68.0 in | Wt 137.2 lb

## 2024-01-31 DIAGNOSIS — C50911 Malignant neoplasm of unspecified site of right female breast: Secondary | ICD-10-CM

## 2024-01-31 DIAGNOSIS — Z171 Estrogen receptor negative status [ER-]: Secondary | ICD-10-CM | POA: Diagnosis not present

## 2024-01-31 DIAGNOSIS — C792 Secondary malignant neoplasm of skin: Secondary | ICD-10-CM | POA: Diagnosis not present

## 2024-01-31 DIAGNOSIS — C50311 Malignant neoplasm of lower-inner quadrant of right female breast: Secondary | ICD-10-CM | POA: Diagnosis not present

## 2024-01-31 MED ORDER — PROCHLORPERAZINE MALEATE 10 MG PO TABS: 10.0000 mg | ORAL_TABLET | Freq: Four times a day (QID) | ORAL | 1 refills | Status: AC | PRN

## 2024-01-31 MED ORDER — ONDANSETRON HCL 8 MG PO TABS: 8.0000 mg | ORAL_TABLET | Freq: Three times a day (TID) | ORAL | 1 refills | Status: AC | PRN

## 2024-01-31 NOTE — Progress Notes (Signed)
 START ON PATHWAY REGIMEN - Breast     A cycle is every 21 days:     Fam-trastuzumab  deruxtecan-nxki   **Always confirm dose/schedule in your pharmacy ordering system**  Patient Characteristics: Distant Metastases or Locoregional Recurrent Disease - Unresectable, M0 or Locally Advanced Unresectable Disease Progressing after Neoadjuvant and Local Therapies, M0, HER2 Negative/Ultralow/Low, ER Negative, Chemotherapy, HER2 Low, Third Line and  Beyond, [employer] or Not a Candidate for Molecular Targeted Therapy Therapeutic Status: Distant Metastases ER Status: Negative (-) PR Status: Negative (-) HER2 Status: Low Therapy Approach Indicated: Standard Chemotherapy/Endocrine Therapy Line of Therapy: Third Line and Beyond Intent of Therapy: Non-Curative / Palliative Intent, Discussed with Patient

## 2024-01-31 NOTE — Progress Notes (Signed)
 "  Patient Care Team: Onita Rush, MD as PCP - General (Internal Medicine) Ethyl Lenis, MD as Consulting Physician (General Surgery) Odean Potts, MD as Medical Oncologist (Hematology and Oncology) Crawford Morna Pickle, NP as Nurse Practitioner (Hematology and Oncology) Jannis Kate Norris, MD as Consulting Physician (Obstetrics and Gynecology) Tyree Nanetta SAILOR, RN as Oncology Nurse Navigator Lucila Rush LABOR, RPH-CPP as Pharmacist (Hematology and Oncology)  DIAGNOSIS:  Encounter Diagnosis  Name Primary?   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 Genes tested include:  ATM, BARD1, BRCA1, BRCA2, BRIP1, CDH1, CHEK2, EPCAM, FANCC, MLH1, MSH2, MSH6, NBN, PALB2, PMS2, PTEN, RAD51C, RAD51D, TP53, and XRCC2.   02/03/2015 Surgery   Right lumpectomy: Invasive ductal carcinoma grade 2, 2.1 cm, with associated DCIS, margins are negative, 0/1 lymph node, ER 0%, PR 0%, HER-2 positive ratio 2.31, T2 N0 stage II a   03/09/2015 - 06/22/2015 Chemotherapy   Adjuvant chemotherapy with TCH 6 followed by Herceptin  maintenance for 1 year   08/11/2015 - 09/23/2015 Radiation Therapy   Adjuvant radiation therapy Audry):  1) Right Breast / 50 Gy in 25 fractions ; 2) Right Breast Boost / 10 Gy in 5 fractions   01/30/2017 Relapse/Recurrence   Skin biopsy right breast: Metastatic breast cancer GCDFP strongly positive   02/15/2017 PET scan   Hypermetabolic focus of  skin thickening along the medial right breast/chest wall consistent with recurrent disease, no other hypermetabolic metastases identified.   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  Her chronic right chest wall wound appears worse on recent PET imaging, with possible exposure of the fourth and fifth ribs. The wound remains non-tender without pain. She continues Iodosorb dressings with twice-weekly nursing visits and has not noticed local infection or systemic symptoms. She is scheduled to see thoracic surgery next week for evaluation of the wound and PET findings.  Recent PET scan shows no new sites of disease and stable bone metastases. No pulmonary or hepatic metastases were reported. She denies abnormal bleeding, bruising, fever, or malaise.  She is taking capecitabine  on a two weeks on, one week off regimen using leftover pills from a prior treatment interruption. She notes nail changes with curling, dryness, and periungual skin cracking, without significant peeling. She manages this with trimming and lotion.  She denies other new symptoms or limitations in daily activities.        ALLERGIES:  has no known allergies.  MEDICATIONS:  Current Outpatient Medications  Medication Sig  Dispense Refill   acetaminophen  (TYLENOL ) 650 MG CR tablet Take 650 mg by mouth every 8 (eight) hours as needed for pain.     Ascorbic Acid (VITAMIN C PO) Take by mouth.     Calcium  500-2.5 MG-MCG CHEW      capecitabine  (XELODA ) 500 MG tablet 2 tabs in AM and 1 tab in PM 14 days on and 7 days off 42 tablet 6   hydrochlorothiazide  (HYDRODIURIL ) 12.5 MG tablet Take 12.5 mg by mouth in the morning.     losartan  (COZAAR ) 100 MG tablet Take 100 mg by mouth in the morning.  0   Multiple Vitamin (MULTIVITAMIN) tablet Take 1 tablet by mouth daily.     oxyCODONE  10 MG TABS Take 1 tablet (10 mg total) by mouth every 6 (six) hours as needed for severe pain (pain score 7-10). 60 tablet 0   Polyethyl Glyc-Propyl Glyc PF (SYSTANE PRESERVATIVE FREE) 0.4-0.3 % SOLN Place 1-2 drops into both eyes 3 (three) times daily as needed (dry/irritated eyes).     rosuvastatin  (CRESTOR ) 20 MG tablet Take 20 mg by mouth daily.     Vibegron (GEMTESA) 75 MG TABS 1 tablet Orally Once a day     zinc gluconate 50 MG tablet Take 50 mg by mouth daily.     No current facility-administered medications for this visit.   Facility-Administered Medications Ordered in Other Visits  Medication Dose Route Frequency Provider Last Rate Last Admin   fludeoxyglucose  F - 18 (FDG) injection 6.9 millicurie  6.9 millicurie Intravenous Once PRN Shawna PARAS, MD        PHYSICAL EXAMINATION: ECOG PERFORMANCE STATUS: 1 - Symptomatic but completely ambulatory  Vitals:   01/31/24 1039  BP: 133/71  Pulse: 75  Resp: 18  Temp: (!) 97.4 F (36.3 C)  SpO2: 100%   Filed Weights   01/31/24 1039  Weight: 137 lb 3.2 oz (62.2 kg)     LABORATORY DATA:  I have reviewed the data as listed    Latest Ref Rng & Units 01/26/2024    9:52 AM 12/12/2023    8:30 AM 10/26/2023    1:08 PM  CMP  Glucose 70 - 99 mg/dL 899  91  892   BUN 8 - 23 mg/dL 25  28  28    Creatinine 0.44 - 1.00 mg/dL 8.85  8.81  8.75   Sodium 135 - 145 mmol/L 140  140  140    Potassium 3.5 - 5.1 mmol/L 4.6  3.7  3.6   Chloride 98 - 111 mmol/L 105  105  105   CO2 22 - 32 mmol/L 25  25  28    Calcium  8.9 - 10.3 mg/dL 9.7  9.8  89.9   Total Protein 6.5 - 8.1 g/dL 6.9  6.8  7.2   Total Bilirubin 0.0 - 1.2 mg/dL 0.7  0.7  0.8   Alkaline Phos 38 - 126 U/L 92  101  90   AST 15 - 41 U/L 25  21  18    ALT 0 - 44 U/L 18  12  15      Lab Results  Component Value Date   WBC 4.8 01/26/2024   HGB 13.0 01/26/2024   HCT 37.9 01/26/2024   MCV 105.6 (H) 01/26/2024   PLT 220 01/26/2024   NEUTROABS  3.9 01/26/2024    ASSESSMENT & PLAN:  Breast cancer of lower-inner quadrant of right female breast (HCC) Right lumpectomy 02/03/2015: Invasive ductal carcinoma grade 2, 2.1 cm, with associated DCIS, margins are negative, 0/1 lymph node, ER 0%, PR 0%, HER-2 positive ratio 2.31, T2 N0 stage II a. (Right breast biopsy 3:30 position 12/09/2014: Invasive ductal carcinoma grade 3, ER 0%, PR 0%, HER-2 negative ratio 1.5, Ki-67 90%, 1 cm irregular mass T1C N0 stage IA clinical stage) Genetic counseling revealed NBN mutation   2. Adjuvant chemotherapy with TCH 6 cycles started 03/09/15- 06/22/15 took Herceptin  maintenance until 02/15/2016 3. Followed by radiation therapy 08/11/2015 to 09/22/2015 4. Cutaneous recurrence: 03/07/2017: Right mastectomy: IDC grade 3, 2 foci spanning 1.2 cm and 1.1 cm, lymphovascular invasion is present including dermal lymphatics, margins negative, 0/2 lymph nodes negative, ER 0%, PR 0%, HER-2 negative, Ki-67 not done,pT4b (skin involvement) N0 stage IIIc 5. Adjuvant radiation therapy 11/14/2017-12/29/2017 ----------------------------------------------------------------------------------------------- Second Chest wall Recurrence: 04/10/2023: [facility] dermatology Dr. 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  No evidence of  distant metastases 05/23/2023: Soft tissue chest wall excision: Grade 3 IDC with skin involvement, margins negative, ER 0%, PR 0%, Ki67 70%, HER2 0 negative [facility] molecular testing: Triple negative, HER2 0, TMB 4, PD-L1 negative, no actionable mutations   Third Cutaneous recurrence: 07/17/23: Skin biopsy: Poorly Diff carcinoma ER: 5%, PR 0%, Ki 67: 80%, HER 2: 0  Dr. Vanderbilt do not feel that he could resect the tumor and close the chest wall.   Chest wall wound: Recent antibiotics and then antifungal treatments.  Patient thinks that the wound is slowly improving.   Current treatment: Xeloda  started 08/02/2023 Xeloda  toxicities: Fatigue Dryness of the hands Anemia: Monitoring closely Hand-foot syndrome: Since it improved, she has started back on Xeloda  at 1000 mg in a.m. and 500 mg in p.m.   PET/CT 01/26/2024: Worsening wound/ulceration right chest wall with exposure of right fourth rib and right fifth rib, stable bone metastases, multiple rib fractures  Recommendation switch treatment to Enhertu Will obtain an echocardiogram and start chemotherapy with Enhertu in a week  ------------------------------------- Assessment and Plan Assessment & Plan Metastatic right breast cancer involving chest wall and bone, estrogen receptor negative Progressive disease at right chest wall with worsening wound and possible rib exposure. Bone metastases controlled, no new visceral metastases. Chest wall lesion refractory to capecitabine  due to poor drug penetration. Surgical intervention not feasible due to tumor invasion. - Initiated approval for Enhertu (fam-trastuzumab  deruxtecan) IV every three weeks, extendable to six weeks if stable. - Ordered baseline echocardiogram prior to Enhertu; repeat every three months for cardiac toxicity monitoring. - Prescribed Zofran  and Compazine  for nausea prophylaxis during Enhertu therapy. - Discussed possible portacath for IV access; reassess after initial treatments. -  Advised discontinuation of capecitabine  and return of unused medication. - Reiterated surgical intervention contraindicated due to disease extent. - Scheduled Enhertu initiation and follow-up appointments. - Discussed option to resume capecitabine  if Enhertu not tolerated or if she prefers to discontinue infusions.      No orders of the defined types were placed in this encounter.  The patient has a good understanding of the overall plan. she agrees with it. she will call with any problems that may develop before the next visit here.  I personally spent a total of 45  minutes in the care of the patient today  including preparing to see the patient, getting/reviewing separately obtained history, performing a medically appropriate exam/evaluation, counseling and educating, placing orders, referring and communicating with other health care professionals, documenting clinical information in the EHR, independently interpreting results, communicating results, and coordinating care.   Viinay K Glinda Natzke, MD 01/31/2024    "

## 2024-01-31 NOTE — Assessment & Plan Note (Signed)
 Right lumpectomy 02/03/2015: Invasive ductal carcinoma grade 2, 2.1 cm, with associated DCIS, margins are negative, 0/1 lymph node, ER 0%, PR 0%, HER-2 positive ratio 2.31, T2 N0 stage II a. (Right breast biopsy 3:30 position 12/09/2014: Invasive ductal carcinoma grade 3, ER 0%, PR 0%, HER-2 negative ratio 1.5, Ki-67 90%, 1 cm irregular mass T1C N0 stage IA clinical stage) Genetic counseling revealed NBN mutation   2. Adjuvant chemotherapy with TCH 6 cycles started 03/09/15- 06/22/15 took Herceptin  maintenance until 02/15/2016 3. Followed by radiation therapy 08/11/2015 to 09/22/2015 4. Cutaneous recurrence: 03/07/2017: Right mastectomy: IDC grade 3, 2 foci spanning 1.2 cm and 1.1 cm, lymphovascular invasion is present including dermal lymphatics, margins negative, 0/2 lymph nodes negative, ER 0%, PR 0%, HER-2 negative, Ki-67 not done,pT4b (skin involvement) N0 stage IIIc 5. Adjuvant radiation therapy 11/14/2017-12/29/2017 ----------------------------------------------------------------------------------------------- Second Chest wall Recurrence: 04/10/2023: [facility] dermatology Dr. 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  No evidence of distant metastases 05/23/2023: Soft tissue chest wall excision: Grade 3 IDC with skin involvement, margins negative, ER 0%, PR 0%, Ki67 70%, HER2 0 negative [facility] molecular testing: Triple negative, HER2 0, TMB 4, PD-L1 negative, no actionable mutations   Third Cutaneous recurrence: 07/17/23: Skin biopsy: Poorly Diff carcinoma ER: 5%, PR 0%, Ki 67: 80%, HER 2: 0  Dr. Vanderbilt do not feel that he could resect the tumor and close the chest wall.   Chest wall wound: Recent antibiotics and then antifungal treatments.  Patient thinks that the wound is slowly improving.   Current treatment: Xeloda  started 08/02/2023 Xeloda   toxicities: Fatigue Dryness of the hands Anemia: Monitoring closely Hand-foot syndrome: Since it improved, she has started back on Xeloda  at 1000 mg in a.m. and 500 mg in p.m.   PET/CT 01/26/2024: Worsening wound/ulceration right chest wall with exposure of right fourth rib and right fifth rib, stable bone metastases, multiple rib fractures  Recommendation switch treatment to Enhertu

## 2024-02-01 ENCOUNTER — Other Ambulatory Visit: Payer: Self-pay

## 2024-02-01 NOTE — Progress Notes (Signed)
 Per visit 1/14, patient had progression of disease. Discontinued xeloda  and will start Enhertu infusions. Dis-enrolled.

## 2024-02-02 ENCOUNTER — Ambulatory Visit: Admitting: Obstetrics and Gynecology

## 2024-02-02 ENCOUNTER — Ambulatory Visit (HOSPITAL_COMMUNITY)
Admission: RE | Admit: 2024-02-02 | Discharge: 2024-02-02 | Disposition: A | Discharge status: Home / Self Care | Source: Ambulatory Visit | Attending: Hematology and Oncology | Admitting: Hematology and Oncology

## 2024-02-02 DIAGNOSIS — I340 Nonrheumatic mitral (valve) insufficiency: Secondary | ICD-10-CM | POA: Diagnosis not present

## 2024-02-02 DIAGNOSIS — C50311 Malignant neoplasm of lower-inner quadrant of right female breast: Secondary | ICD-10-CM | POA: Diagnosis present

## 2024-02-02 DIAGNOSIS — C792 Secondary malignant neoplasm of skin: Secondary | ICD-10-CM | POA: Insufficient documentation

## 2024-02-02 DIAGNOSIS — Z923 Personal history of irradiation: Secondary | ICD-10-CM | POA: Diagnosis not present

## 2024-02-02 DIAGNOSIS — I10 Essential (primary) hypertension: Secondary | ICD-10-CM | POA: Diagnosis not present

## 2024-02-02 DIAGNOSIS — Z9221 Personal history of antineoplastic chemotherapy: Secondary | ICD-10-CM | POA: Diagnosis not present

## 2024-02-02 DIAGNOSIS — C50911 Malignant neoplasm of unspecified site of right female breast: Secondary | ICD-10-CM

## 2024-02-02 DIAGNOSIS — Z171 Estrogen receptor negative status [ER-]: Secondary | ICD-10-CM | POA: Insufficient documentation

## 2024-02-02 LAB — ECHOCARDIOGRAM COMPLETE
Area-P 1/2: 3.97 cm2
S' Lateral: 1.8 cm

## 2024-02-05 ENCOUNTER — Telehealth: Payer: Self-pay | Admitting: *Deleted

## 2024-02-05 ENCOUNTER — Other Ambulatory Visit: Payer: Self-pay | Admitting: Pharmacist

## 2024-02-05 NOTE — Telephone Encounter (Signed)
 Received call from pt stating she is starting Mybetriq (Mirabegron ) for urinary incontinence and is requesting advice from if okay to proceed while on Enhertu . Rn reviewed with pharmacy team who states no interaction found, okay to proceed.  Pt educated and verbalized understanding.

## 2024-02-06 ENCOUNTER — Inpatient Hospital Stay: Admitting: Hematology and Oncology

## 2024-02-06 ENCOUNTER — Ambulatory Visit: Admitting: Thoracic Surgery (Cardiothoracic Vascular Surgery)

## 2024-02-06 NOTE — Progress Notes (Signed)
 Pharmacist Chemotherapy Monitoring - Initial Assessment    Anticipated start date: 02/07/2024   The following has been reviewed per standard work regarding the patient's treatment regimen: The patient's diagnosis, treatment plan and drug doses, and organ/hematologic function Lab orders and baseline tests specific to treatment regimen  The treatment plan start date, drug sequencing, and pre-medications Prior authorization status  Patient's documented medication list, including drug-drug interaction screen and prescriptions for anti-emetics and supportive care specific to the treatment regimen The drug concentrations, fluid compatibility, administration routes, and timing of the medications to be used The patient's access for treatment and lifetime cumulative dose history, if applicable  The patient's medication allergies and previous infusion related reactions, if applicable   Changes made to treatment plan:  N/A  Follow up needed:  N/A   Shawna [name], RPH, 02/06/2024  10:43 AM

## 2024-02-07 ENCOUNTER — Inpatient Hospital Stay

## 2024-02-07 ENCOUNTER — Inpatient Hospital Stay: Admitting: Hematology and Oncology

## 2024-02-07 VITALS — BP 145/77 | HR 79 | Temp 98.0°F | Resp 16 | Wt 136.7 lb

## 2024-02-07 VITALS — BP 141/82 | HR 68 | Resp 18

## 2024-02-07 DIAGNOSIS — Z171 Estrogen receptor negative status [ER-]: Secondary | ICD-10-CM

## 2024-02-07 DIAGNOSIS — C50311 Malignant neoplasm of lower-inner quadrant of right female breast: Secondary | ICD-10-CM | POA: Diagnosis not present

## 2024-02-07 DIAGNOSIS — C50911 Malignant neoplasm of unspecified site of right female breast: Secondary | ICD-10-CM

## 2024-02-07 LAB — CMP (CANCER CENTER ONLY)
ALT: 18 U/L (ref 0–44)
AST: 26 U/L (ref 15–41)
Albumin: 4.2 g/dL (ref 3.5–5.0)
Alkaline Phosphatase: 103 U/L (ref 38–126)
Anion gap: 15 (ref 5–15)
BUN: 26 mg/dL — ABNORMAL HIGH (ref 8–23)
CO2: 22 mmol/L (ref 22–32)
Calcium: 9.9 mg/dL (ref 8.9–10.3)
Chloride: 103 mmol/L (ref 98–111)
Creatinine: 1.31 mg/dL — ABNORMAL HIGH (ref 0.44–1.00)
GFR, Estimated: 41 mL/min — ABNORMAL LOW
Glucose, Bld: 104 mg/dL — ABNORMAL HIGH (ref 70–99)
Potassium: 3.8 mmol/L (ref 3.5–5.1)
Sodium: 141 mmol/L (ref 135–145)
Total Bilirubin: 0.7 mg/dL (ref 0.0–1.2)
Total Protein: 7.2 g/dL (ref 6.5–8.1)

## 2024-02-07 LAB — CBC WITH DIFFERENTIAL (CANCER CENTER ONLY)
Abs Immature Granulocytes: 0.01 K/uL (ref 0.00–0.07)
Basophils Absolute: 0 K/uL (ref 0.0–0.1)
Basophils Relative: 1 %
Eosinophils Absolute: 0.1 K/uL (ref 0.0–0.5)
Eosinophils Relative: 2 %
HCT: 37.3 % (ref 36.0–46.0)
Hemoglobin: 13 g/dL (ref 12.0–15.0)
Immature Granulocytes: 0 %
Lymphocytes Relative: 12 %
Lymphs Abs: 0.7 K/uL (ref 0.7–4.0)
MCH: 35.8 pg — ABNORMAL HIGH (ref 26.0–34.0)
MCHC: 34.9 g/dL (ref 30.0–36.0)
MCV: 102.8 fL — ABNORMAL HIGH (ref 80.0–100.0)
Monocytes Absolute: 0.2 K/uL (ref 0.1–1.0)
Monocytes Relative: 4 %
Neutro Abs: 4.4 K/uL (ref 1.7–7.7)
Neutrophils Relative %: 81 %
Platelet Count: 228 K/uL (ref 150–400)
RBC: 3.63 MIL/uL — ABNORMAL LOW (ref 3.87–5.11)
RDW: 14.4 % (ref 11.5–15.5)
WBC Count: 5.4 K/uL (ref 4.0–10.5)
nRBC: 0 % (ref 0.0–0.2)

## 2024-02-07 MED ORDER — DEXAMETHASONE SOD PHOSPHATE PF 10 MG/ML IJ SOLN
10.0000 mg | Freq: Once | INTRAMUSCULAR | Status: AC
  Administered 2024-02-07: 10 mg via INTRAVENOUS
  Filled 2024-02-07: qty 1

## 2024-02-07 MED ORDER — FAM-TRASTUZUMAB DERUXTECAN-NXKI CHEMO 100 MG IV SOLR
4.4000 mg/kg | Freq: Once | INTRAVENOUS | Status: AC
  Administered 2024-02-07: 300 mg via INTRAVENOUS
  Filled 2024-02-07: qty 15

## 2024-02-07 MED ORDER — ACETAMINOPHEN 325 MG PO TABS
650.0000 mg | ORAL_TABLET | Freq: Once | ORAL | Status: AC
  Administered 2024-02-07: 650 mg via ORAL
  Filled 2024-02-07: qty 2

## 2024-02-07 MED ORDER — PALONOSETRON HCL INJECTION 0.25 MG/5ML
0.2500 mg | Freq: Once | INTRAVENOUS | Status: AC
  Administered 2024-02-07: 0.25 mg via INTRAVENOUS
  Filled 2024-02-07: qty 5

## 2024-02-07 MED ORDER — DEXTROSE 5 % IV SOLN: INTRAVENOUS | Status: DC

## 2024-02-07 MED ORDER — SODIUM CHLORIDE 0.9 % IV SOLN: Freq: Once | INTRAVENOUS | Status: AC

## 2024-02-07 MED ORDER — APREPITANT 130 MG/18ML IV EMUL
130.0000 mg | Freq: Once | INTRAVENOUS | Status: AC
  Administered 2024-02-07: 130 mg via INTRAVENOUS
  Filled 2024-02-07: qty 18

## 2024-02-07 MED ORDER — DIPHENHYDRAMINE HCL 25 MG PO CAPS
25.0000 mg | ORAL_CAPSULE | Freq: Once | ORAL | Status: AC
  Administered 2024-02-07: 25 mg via ORAL
  Filled 2024-02-07: qty 1

## 2024-02-07 NOTE — Patient Instructions (Signed)
 [facility] - A DEPT OF [place]  Discharge Instructions: Thank you for choosing [facility] to provide your oncology and hematology care.   If you have a lab appointment with the Cancer Center, please go directly to the Cancer Center and check in at the registration area.   Wear comfortable clothing and clothing appropriate for easy access to any Portacath or PICC line.   We strive to give you quality time with your provider. You may need to reschedule your appointment if you arrive late (15 or more minutes).  Arriving late affects you and other patients whose appointments are after yours.  Also, if you miss three or more appointments without notifying the office, you may be dismissed from the clinic at the providers discretion.      For prescription refill requests, have your pharmacy contact our office and allow 72 hours for refills to be completed.    Today you received the following chemotherapy and/or immunotherapy agents: Fam-trastuzumab  (enhertu )   To help prevent nausea and vomiting after your treatment, we encourage you to take your nausea medication as directed.  BELOW ARE SYMPTOMS THAT SHOULD BE REPORTED IMMEDIATELY: *FEVER GREATER THAN 100.4 F (38 C) OR HIGHER *CHILLS OR SWEATING *NAUSEA AND VOMITING THAT IS NOT CONTROLLED WITH YOUR NAUSEA MEDICATION *UNUSUAL SHORTNESS OF BREATH *UNUSUAL BRUISING OR BLEEDING *URINARY PROBLEMS (pain or burning when urinating, or frequent urination) *BOWEL PROBLEMS (unusual diarrhea, constipation, pain near the anus) TENDERNESS IN MOUTH AND THROAT WITH OR WITHOUT PRESENCE OF ULCERS (sore throat, sores in mouth, or a toothache) UNUSUAL RASH, SWELLING OR PAIN  UNUSUAL VAGINAL DISCHARGE OR ITCHING   Items with * indicate a potential emergency and should be followed up as soon as possible or go to the Emergency Department if any problems should occur.  Please show the CHEMOTHERAPY ALERT CARD or  IMMUNOTHERAPY ALERT CARD at check-in to the Emergency Department and triage nurse.  Should you have questions after your visit or need to cancel or reschedule your appointment, please contact [facility] - A DEPT OF [facility]  Dept: [phone]  and follow the prompts.  Office hours are 8:00 a.m. to 4:30 p.m. Monday - Friday. Please note that voicemails left after 4:00 p.m. may not be returned until the following business day.  We are closed weekends and major holidays. You have access to a nurse at all times for urgent questions. Please call the main number to the clinic Dept: [phone] and follow the prompts.   For any non-urgent questions, you may also contact your provider using MyChart. We now offer e-Visits for anyone 59 and older to request care online for non-urgent symptoms. For details visit [email or web address].   Also download the MyChart app! Go to the app store, search MyChart, open the app, select [place], and log in with your MyChart username and password.

## 2024-02-07 NOTE — Assessment & Plan Note (Signed)
 Right lumpectomy 02/03/2015: Invasive ductal carcinoma grade 2, 2.1 cm, with associated DCIS, margins are negative, 0/1 lymph node, ER 0%, PR 0%, HER-2 positive ratio 2.31, T2 N0 stage II a. (Right breast biopsy 3:30 position 12/09/2014: Invasive ductal carcinoma grade 3, ER 0%, PR 0%, HER-2 negative ratio 1.5, Ki-67 90%, 1 cm irregular mass T1C N0 stage IA clinical stage) Genetic counseling revealed NBN mutation   2. Adjuvant chemotherapy with TCH 6 cycles started 03/09/15- 06/22/15 took Herceptin  maintenance until 02/15/2016 3. Followed by radiation therapy 08/11/2015 to 09/22/2015 4. Cutaneous recurrence: 03/07/2017: Right mastectomy: IDC grade 3, 2 foci spanning 1.2 cm and 1.1 cm, lymphovascular invasion is present including dermal lymphatics, margins negative, 0/2 lymph nodes negative, ER 0%, PR 0%, HER-2 negative, Ki-67 not done,pT4b (skin involvement) N0 stage IIIc 5. Adjuvant radiation therapy 11/14/2017-12/29/2017 ----------------------------------------------------------------------------------------------- Second Chest wall Recurrence: 04/10/2023: [facility] dermatology Dr. 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  No evidence of distant metastases 05/23/2023: Soft tissue chest wall excision: Grade 3 IDC with skin involvement, margins negative, ER 0%, PR 0%, Ki67 70%, HER2 0 negative [facility] molecular testing: Triple negative, HER2 0, TMB 4, PD-L1 negative, no actionable mutations   Third Cutaneous recurrence: 07/17/23: Skin biopsy: Poorly Diff carcinoma ER: 5%, PR 0%, Ki 67: 80%, HER 2: 0  Dr. Vanderbilt do not feel that he could resect the tumor and close the chest wall.   Chest wall wound: Recent antibiotics and then antifungal treatments.  Patient thinks that the wound is slowly improving.   Prior treatment: Xeloda  started 08/02/2023- 02/06/2024   PET/CT  01/26/2024: Worsening wound/ulceration right chest wall with exposure of right fourth rib and right fifth rib, stable bone metastases, multiple rib fractures   Current Treatment: Enhertu  cycle 1

## 2024-02-07 NOTE — Progress Notes (Signed)
 "  Patient Care Team: Onita Rush, MD as PCP - General (Internal Medicine) Ethyl Lenis, MD as Consulting Physician (General Surgery) Odean Potts, MD as Medical Oncologist (Hematology and Oncology) Crawford Morna Pickle, NP as Nurse Practitioner (Hematology and Oncology) Jannis Kate Norris, MD as Consulting Physician (Obstetrics and Gynecology) Tyree Nanetta SAILOR, RN as Oncology Nurse Navigator Lucila Rush LABOR, RPH-CPP as Pharmacist (Hematology and Oncology)  DIAGNOSIS:  Encounter Diagnosis  Name Primary?   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 Genes tested include:  ATM, BARD1, BRCA1, BRCA2, BRIP1, CDH1, CHEK2, EPCAM, FANCC, MLH1, MSH2, MSH6, NBN, PALB2, PMS2, PTEN, RAD51C, RAD51D, TP53, and XRCC2.   02/03/2015 Surgery   Right lumpectomy: Invasive ductal carcinoma grade 2, 2.1 cm, with associated DCIS, margins are negative, 0/1 lymph node, ER 0%, PR 0%, HER-2 positive ratio 2.31, T2 N0 stage II a   03/09/2015 - 06/22/2015 Chemotherapy   Adjuvant chemotherapy with TCH 6 followed by Herceptin  maintenance for 1 year   08/11/2015 - 09/23/2015 Radiation Therapy   Adjuvant radiation therapy Audry):  1) Right Breast / 50 Gy in 25 fractions ; 2) Right Breast Boost / 10 Gy in 5 fractions   01/30/2017 Relapse/Recurrence   Skin biopsy right breast: Metastatic breast cancer GCDFP strongly positive   02/15/2017 PET scan   Hypermetabolic focus of  skin thickening along the medial right breast/chest wall consistent with recurrent disease, no other hypermetabolic metastases identified.   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  She is beginning fam-trastuzumab deruxtecan-nxki  today as third-line therapy after progression on prior neoadjuvant and local treatments. She has not yet had chemotherapy-related adverse effects. She understands she will receive IV antiemetics with treatment. She does not have a port in place.  She has chronic low oral fluid intake and drinks limited water and kombucha but has no symptoms of dehydration, abnormal bleeding, bruising, or other cytopenia symptoms.  She confirmed the antiemetic plan to start oral ondansetron  on day 3 after chemotherapy because of the expected prolonged effect of IV antiemetics. She arranged transportation home after treatment due to anticipated drowsiness but is  otherwise able to drive.      ALLERGIES:  has no known allergies.  MEDICATIONS:  Current Outpatient Medications   Medication Sig Dispense Refill   acetaminophen  (TYLENOL ) 650 MG CR tablet Take 650 mg by mouth every 8 (eight) hours as needed for pain.     Ascorbic Acid (VITAMIN C PO) Take by mouth.     Calcium  500-2.5 MG-MCG CHEW      hydrochlorothiazide  (HYDRODIURIL ) 12.5 MG tablet Take 12.5 mg by mouth in the morning.     losartan  (COZAAR ) 100 MG tablet Take 100 mg by mouth in the morning.  0   Multiple Vitamin (MULTIVITAMIN) tablet Take 1 tablet by mouth daily.     ondansetron  (ZOFRAN ) 8 MG tablet Take 1 tablet (8 mg total) by mouth every 8 (eight) hours as needed for nausea or vomiting. Start on the third day after chemotherapy. 30 tablet 1   oxyCODONE  10 MG TABS Take 1 tablet (10 mg total) by mouth every 6 (six) hours as needed for severe pain (pain score 7-10). 60 tablet 0   Polyethyl Glyc-Propyl Glyc PF (SYSTANE PRESERVATIVE FREE) 0.4-0.3 % SOLN Place 1-2 drops into both eyes 3 (three) times daily as needed (dry/irritated eyes).     prochlorperazine  (COMPAZINE ) 10 MG tablet Take 1 tablet (10 mg total) by mouth every 6 (six) hours as needed for nausea or vomiting. 30 tablet 1   rosuvastatin  (CRESTOR ) 20 MG tablet Take 20 mg by mouth daily.     Vibegron (GEMTESA) 75 MG TABS 1 tablet Orally Once a day     zinc gluconate 50 MG tablet Take 50 mg by mouth daily.     No current facility-administered medications for this visit.    PHYSICAL EXAMINATION: ECOG PERFORMANCE STATUS: 1 - Symptomatic but completely ambulatory  Vitals:   02/07/24 1411  BP: (!) 145/77  Pulse: 79  Resp: 16  Temp: 98 F (36.7 C)  SpO2: 96%   Filed Weights   02/07/24 1411  Weight: 136 lb 11.2 oz (62 kg)    Physical Exam   (exam performed in the presence of a chaperone)  LABORATORY DATA:  I have reviewed the data as listed    Latest Ref Rng & Units 02/07/2024    1:30 PM 01/26/2024    9:52 AM 12/12/2023    8:30 AM  CMP  Glucose 70 - 99 mg/dL 895  899  91   BUN 8 - 23 mg/dL 26  25  28    Creatinine 0.44 - 1.00 mg/dL  8.68  8.85  8.81   Sodium 135 - 145 mmol/L 141  140  140   Potassium 3.5 - 5.1 mmol/L 3.8  4.6  3.7   Chloride 98 - 111 mmol/L 103  105  105   CO2 22 - 32 mmol/L 22  25  25    Calcium  8.9 - 10.3 mg/dL 9.9  9.7  9.8   Total Protein 6.5 - 8.1 g/dL 7.2  6.9  6.8   Total Bilirubin 0.0 - 1.2 mg/dL 0.7  0.7  0.7   Alkaline Phos 38 - 126 U/L 103  92  101   AST 15 - 41 U/L 26  25  21    ALT 0 - 44 U/L 18  18  12      Lab Results  Component Value Date   WBC 5.4 02/07/2024   HGB 13.0 02/07/2024   HCT 37.3 02/07/2024   MCV 102.8 (H) 02/07/2024   PLT 228 02/07/2024   NEUTROABS  4.4 02/07/2024    ASSESSMENT & PLAN:  Breast cancer of lower-inner quadrant of right female breast (HCC) Right lumpectomy 02/03/2015: Invasive ductal carcinoma grade 2, 2.1 cm, with associated DCIS, margins are negative, 0/1 lymph node, ER 0%, PR 0%, HER-2 positive ratio 2.31, T2 N0 stage II a. (Right breast biopsy 3:30 position 12/09/2014: Invasive ductal carcinoma grade 3, ER 0%, PR 0%, HER-2 negative ratio 1.5, Ki-67 90%, 1 cm irregular mass T1C N0 stage IA clinical stage) Genetic counseling revealed NBN mutation   2. Adjuvant chemotherapy with TCH 6 cycles started 03/09/15- 06/22/15 took Herceptin  maintenance until 02/15/2016 3. Followed by radiation therapy 08/11/2015 to 09/22/2015 4. Cutaneous recurrence: 03/07/2017: Right mastectomy: IDC grade 3, 2 foci spanning 1.2 cm and 1.1 cm, lymphovascular invasion is present including dermal lymphatics, margins negative, 0/2 lymph nodes negative, ER 0%, PR 0%, HER-2 negative, Ki-67 not done,pT4b (skin involvement) N0 stage IIIc 5. Adjuvant radiation therapy 11/14/2017-12/29/2017 ----------------------------------------------------------------------------------------------- Second Chest wall Recurrence: 04/10/2023: [facility] dermatology Dr. Joshua skin biopsy right chest wall: Poorly differentiated metastatic breast cancer ER 0%, PR 0%, Ki-67 70%, HER2 0 PET/CT 05/03/2023:  Hypermetabolic nodule on the right chest wall.  No evidence of distant metastases 05/23/2023: Soft tissue chest wall excision: Grade 3 IDC with skin involvement, margins negative, ER 0%, PR 0%, Ki67 70%, HER2 0 negative [facility] molecular testing: Triple negative, HER2 0, TMB 4, PD-L1 negative, no actionable mutations   Third Cutaneous recurrence: 07/17/23: Skin biopsy: Poorly Diff carcinoma ER: 5%, PR 0%, Ki 67: 80%, HER 2: 0  Dr. Vanderbilt do not feel that he could resect the tumor and close the chest wall.   Chest wall wound: Recent antibiotics and then antifungal treatments.  Patient thinks that the wound is slowly improving.   Prior treatment: Xeloda  started 08/02/2023- 02/06/2024   PET/CT 01/26/2024: Worsening wound/ulceration right chest wall with exposure of right fourth rib and right fifth rib, stable bone metastases, multiple rib fractures   Current Treatment: Enhertu  cycle 1  Renal dysfunction: I will add finder cc of normal saline with each treatment.  No orders of the defined types were placed in this encounter.  The patient has a good understanding of the overall plan. she agrees with it. she will call with any problems that may develop before the next visit here.  I personally spent a total of 30 minutes in the care of the patient today including preparing to see the patient, getting/reviewing separately obtained history, performing a medically appropriate exam/evaluation, counseling and educating, placing orders, referring and communicating with other health care professionals, documenting clinical information in the EHR, independently interpreting results, communicating results, and coordinating care.   Viinay K Ysenia Filice, MD 02/07/24    "

## 2024-02-20 ENCOUNTER — Telehealth: Payer: Self-pay | Admitting: *Deleted

## 2024-02-28 ENCOUNTER — Inpatient Hospital Stay

## 2024-02-28 ENCOUNTER — Inpatient Hospital Stay: Attending: Hematology and Oncology

## 2024-02-28 ENCOUNTER — Inpatient Hospital Stay: Admitting: Adult Health

## 2024-02-29 ENCOUNTER — Ambulatory Visit: Admitting: Obstetrics and Gynecology
